# Patient Record
Sex: Female | Born: 1965
Health system: Southern US, Community
[De-identification: ages and names within clinical notes are randomized; demographics above are authoritative.]

## PROBLEM LIST (undated history)

## (undated) DIAGNOSIS — Z87442 Personal history of urinary calculi: Secondary | ICD-10-CM

## (undated) DIAGNOSIS — E785 Hyperlipidemia, unspecified: Secondary | ICD-10-CM

## (undated) DIAGNOSIS — F329 Major depressive disorder, single episode, unspecified: Secondary | ICD-10-CM

## (undated) DIAGNOSIS — J189 Pneumonia, unspecified organism: Secondary | ICD-10-CM

## (undated) DIAGNOSIS — F32A Depression, unspecified: Secondary | ICD-10-CM

## (undated) DIAGNOSIS — K219 Gastro-esophageal reflux disease without esophagitis: Secondary | ICD-10-CM

## (undated) DIAGNOSIS — Z136 Encounter for screening for cardiovascular disorders: Secondary | ICD-10-CM

## (undated) DIAGNOSIS — F419 Anxiety disorder, unspecified: Secondary | ICD-10-CM

## (undated) DIAGNOSIS — N200 Calculus of kidney: Secondary | ICD-10-CM

## (undated) HISTORY — DX: Major depressive disorder, single episode, unspecified: F32.9

## (undated) HISTORY — DX: Hyperlipidemia, unspecified: E78.5

## (undated) HISTORY — DX: Gastro-esophageal reflux disease without esophagitis: K21.9

## (undated) HISTORY — DX: Calculus of kidney: N20.0

## (undated) HISTORY — DX: Depression, unspecified: F32.A

## (undated) HISTORY — DX: Encounter for screening for cardiovascular disorders: Z13.6

## (undated) HISTORY — PX: CHOLECYSTECTOMY: SHX55

## (undated) HISTORY — PX: COLONOSCOPY: SHX174

## (undated) HISTORY — PX: ABDOMINAL HYSTERECTOMY: SHX81

---

## 1999-08-11 HISTORY — PX: PARTIAL HYSTERECTOMY: SHX80

## 1999-08-11 HISTORY — PX: ABDOMINAL HYSTERECTOMY: SHX81

## 2000-08-10 HISTORY — PX: CHOLECYSTECTOMY: SHX55

## 2006-09-27 ENCOUNTER — Other Ambulatory Visit: Payer: Self-pay

## 2006-09-27 ENCOUNTER — Emergency Department: Payer: Self-pay

## 2007-02-03 ENCOUNTER — Encounter: Payer: Self-pay | Admitting: Family Medicine

## 2007-04-28 ENCOUNTER — Ambulatory Visit: Payer: Self-pay | Admitting: Unknown Physician Specialty

## 2007-07-02 ENCOUNTER — Emergency Department: Payer: Self-pay | Admitting: Unknown Physician Specialty

## 2007-07-02 ENCOUNTER — Other Ambulatory Visit: Payer: Self-pay

## 2007-07-06 ENCOUNTER — Emergency Department: Payer: Self-pay | Admitting: Emergency Medicine

## 2007-07-07 ENCOUNTER — Encounter: Payer: Self-pay | Admitting: Family Medicine

## 2007-07-07 ENCOUNTER — Other Ambulatory Visit: Payer: Self-pay

## 2007-09-02 ENCOUNTER — Emergency Department: Payer: Self-pay | Admitting: Emergency Medicine

## 2007-09-03 ENCOUNTER — Other Ambulatory Visit: Payer: Self-pay

## 2008-03-28 ENCOUNTER — Ambulatory Visit: Payer: Self-pay | Admitting: Family Medicine

## 2008-03-28 DIAGNOSIS — E782 Mixed hyperlipidemia: Secondary | ICD-10-CM

## 2008-03-28 DIAGNOSIS — K219 Gastro-esophageal reflux disease without esophagitis: Secondary | ICD-10-CM | POA: Insufficient documentation

## 2008-03-28 DIAGNOSIS — R0789 Other chest pain: Secondary | ICD-10-CM | POA: Insufficient documentation

## 2008-03-28 DIAGNOSIS — E060 Acute thyroiditis: Secondary | ICD-10-CM | POA: Insufficient documentation

## 2008-03-28 DIAGNOSIS — M940 Chondrocostal junction syndrome [Tietze]: Secondary | ICD-10-CM

## 2008-03-28 DIAGNOSIS — G43009 Migraine without aura, not intractable, without status migrainosus: Secondary | ICD-10-CM | POA: Insufficient documentation

## 2008-03-29 ENCOUNTER — Telehealth: Payer: Self-pay | Admitting: Family Medicine

## 2008-04-27 LAB — CONVERTED CEMR LAB: Pap Smear: NORMAL

## 2008-05-07 ENCOUNTER — Ambulatory Visit: Payer: Self-pay | Admitting: Family Medicine

## 2008-05-07 DIAGNOSIS — Z87891 Personal history of nicotine dependence: Secondary | ICD-10-CM

## 2008-05-29 ENCOUNTER — Ambulatory Visit: Payer: Self-pay | Admitting: Family Medicine

## 2008-05-30 ENCOUNTER — Telehealth (INDEPENDENT_AMBULATORY_CARE_PROVIDER_SITE_OTHER): Payer: Self-pay | Admitting: *Deleted

## 2008-06-05 ENCOUNTER — Emergency Department: Payer: Self-pay | Admitting: Emergency Medicine

## 2008-10-25 ENCOUNTER — Emergency Department: Payer: Self-pay | Admitting: Emergency Medicine

## 2008-10-31 ENCOUNTER — Ambulatory Visit: Payer: Self-pay | Admitting: Family Medicine

## 2008-11-02 ENCOUNTER — Ambulatory Visit: Payer: Self-pay | Admitting: Family Medicine

## 2008-11-02 ENCOUNTER — Encounter: Payer: Self-pay | Admitting: Family Medicine

## 2008-12-05 ENCOUNTER — Ambulatory Visit: Payer: Self-pay | Admitting: Gastroenterology

## 2008-12-10 ENCOUNTER — Encounter: Payer: Self-pay | Admitting: Family Medicine

## 2008-12-10 ENCOUNTER — Emergency Department: Payer: Self-pay | Admitting: Emergency Medicine

## 2009-01-07 ENCOUNTER — Encounter: Payer: Self-pay | Admitting: Family Medicine

## 2009-01-09 ENCOUNTER — Ambulatory Visit: Payer: Self-pay | Admitting: Unknown Physician Specialty

## 2009-01-09 ENCOUNTER — Encounter: Payer: Self-pay | Admitting: Family Medicine

## 2009-01-11 ENCOUNTER — Ambulatory Visit: Payer: Self-pay | Admitting: Family Medicine

## 2009-04-22 ENCOUNTER — Emergency Department: Payer: Self-pay | Admitting: Emergency Medicine

## 2009-04-23 ENCOUNTER — Emergency Department (HOSPITAL_COMMUNITY): Admission: EM | Admit: 2009-04-23 | Discharge: 2009-04-23 | Payer: Self-pay | Admitting: Emergency Medicine

## 2009-05-01 ENCOUNTER — Ambulatory Visit: Payer: Self-pay | Admitting: Family Medicine

## 2009-05-01 DIAGNOSIS — R5383 Other fatigue: Secondary | ICD-10-CM | POA: Insufficient documentation

## 2009-05-06 LAB — CONVERTED CEMR LAB
AST: 33 units/L (ref 0–37)
Alkaline Phosphatase: 45 units/L (ref 39–117)
Basophils Absolute: 0.1 10*3/uL (ref 0.0–0.1)
Calcium: 9.2 mg/dL (ref 8.4–10.5)
GFR calc non Af Amer: 97.77 mL/min (ref >60)
Glucose, Bld: 80 mg/dL (ref 70–99)
Hemoglobin: 13.1 g/dL (ref 12.0–15.0)
Lymphocytes Relative: 33.9 % (ref 12.0–46.0)
Monocytes Relative: 5 % (ref 3.0–12.0)
Neutro Abs: 3.8 10*3/uL (ref 1.4–7.7)
Platelets: 212 10*3/uL (ref 150.0–400.0)
RDW: 12 % (ref 11.5–14.6)
Sodium: 139 meq/L (ref 135–145)
Total Bilirubin: 0.5 mg/dL (ref 0.3–1.2)
Vit D, 25-Hydroxy: 40 ng/mL (ref 30–89)
Vitamin B-12: 277 pg/mL (ref 211–911)

## 2009-05-16 ENCOUNTER — Ambulatory Visit: Payer: Self-pay | Admitting: Family Medicine

## 2009-05-16 DIAGNOSIS — F411 Generalized anxiety disorder: Secondary | ICD-10-CM | POA: Insufficient documentation

## 2009-08-13 ENCOUNTER — Encounter: Payer: Self-pay | Admitting: Family Medicine

## 2009-10-29 ENCOUNTER — Emergency Department: Payer: Self-pay | Admitting: Emergency Medicine

## 2009-10-30 ENCOUNTER — Encounter: Payer: Self-pay | Admitting: Family Medicine

## 2009-11-01 ENCOUNTER — Ambulatory Visit: Payer: Self-pay | Admitting: Family Medicine

## 2009-11-01 LAB — CONVERTED CEMR LAB
Bilirubin Urine: NEGATIVE
Blood in Urine, dipstick: NEGATIVE
Ketones, urine, test strip: NEGATIVE
Protein, U semiquant: NEGATIVE
Urobilinogen, UA: 0.2

## 2009-11-05 ENCOUNTER — Encounter: Payer: Self-pay | Admitting: Family Medicine

## 2010-01-21 ENCOUNTER — Encounter: Payer: Self-pay | Admitting: Family Medicine

## 2010-05-10 ENCOUNTER — Encounter: Payer: Self-pay | Admitting: Family Medicine

## 2010-05-10 LAB — CONVERTED CEMR LAB: Pap Smear: NORMAL

## 2010-05-10 LAB — HM MAMMOGRAPHY

## 2010-05-28 ENCOUNTER — Telehealth: Payer: Self-pay | Admitting: Family Medicine

## 2010-05-30 ENCOUNTER — Encounter: Payer: Self-pay | Admitting: Family Medicine

## 2010-05-30 ENCOUNTER — Ambulatory Visit: Payer: Self-pay | Admitting: Family Medicine

## 2010-05-30 DIAGNOSIS — M549 Dorsalgia, unspecified: Secondary | ICD-10-CM

## 2010-06-02 ENCOUNTER — Telehealth: Payer: Self-pay | Admitting: Family Medicine

## 2010-06-05 ENCOUNTER — Encounter: Admission: RE | Admit: 2010-06-05 | Discharge: 2010-06-05 | Payer: Self-pay | Admitting: Family Medicine

## 2010-06-11 ENCOUNTER — Ambulatory Visit: Payer: Self-pay | Admitting: Unknown Physician Specialty

## 2010-06-20 ENCOUNTER — Encounter: Payer: Self-pay | Admitting: Family Medicine

## 2010-07-11 ENCOUNTER — Emergency Department: Payer: Self-pay | Admitting: Emergency Medicine

## 2010-07-15 ENCOUNTER — Encounter: Payer: Self-pay | Admitting: Family Medicine

## 2010-08-20 ENCOUNTER — Emergency Department: Payer: Self-pay | Admitting: Emergency Medicine

## 2010-09-09 NOTE — Letter (Signed)
Summary: [facility]  [facility]   Imported By: Lanelle Bal 01/31/2010 10:15:14  _____________________________________________________________________  External Attachment:    Type:   Image     Comment:   External Document

## 2010-09-09 NOTE — Consult Note (Signed)
Summary: [facility]  [facility]   Imported By: Lanelle Bal 07/02/2010 10:15:36  _____________________________________________________________________  External Attachment:    Type:   Image     Comment:   External Document

## 2010-09-09 NOTE — Progress Notes (Signed)
  Phone Note Call from Patient   Caller: Patient Summary of Call: Called the patient and set up her MRI for thurs at [facility]. She is claustrophobic and is requesting that Valium be called in for her. [facility]. She will get a driver.  Initial call taken by: Carlton Adam,  June 02, 2010 9:50 AM  Follow-up for Phone Call        Valium too long acting for procedure will call in ativan. Follow-up by: Kerby Nora MD,  June 03, 2010 8:23 AM  Additional Follow-up for Phone Call Additional follow up Details #1::        rx called to pharmacy.Consuello Masse CMA   Additional Follow-up by: Benny Lennert CMA Duncan Dull),  June 03, 2010 8:50 AM    New/Updated Medications: ATIVAN 0.5 MG TABS (LORAZEPAM) 1-2 tabs prior to procedure. Prescriptions: ATIVAN 0.5 MG TABS (LORAZEPAM) 1-2 tabs prior to procedure.  #2 x 0   Entered and Authorized by:   Kerby Nora MD   Signed by:   Kerby Nora MD on 06/03/2010   Method used:   Telephoned to ...       [id number] (retail)       [address]       [place], [place]  [address]       Ph: [phone] or [phone]       Fax: [phone]   RxID:   [id number]
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
## 2010-09-09 NOTE — Consult Note (Signed)
Summary: [facility]  [facility]   Imported By: Lanelle Bal 08/26/2009 10:44:44  _____________________________________________________________________  External Attachment:    Type:   Image     Comment:   External Document

## 2010-09-09 NOTE — Progress Notes (Signed)
Summary: back pain   Phone Note Call from Patient Call back at Home Phone [phone]   Caller: Patient Call For: Kerby Nora MD Summary of Call: Patient says that she is in constant pain with her back and that Dr. Corliss Skains with [facility] is not doing anything to help her. She is asking if you could refer her to someone else. Pleae advise.  Initial call taken by: Melody Comas,  May 28, 2010 2:32 PM  Follow-up for Phone Call        Please have her make an appt to gather info and examine her for her back so I can determine best course of treatment..who to refer to etc.  Follow-up by: Kerby Nora MD,  May 29, 2010 8:20 AM  Additional Follow-up for Phone Call Additional follow up Details #1::        Patient says that she will make appt Additional Follow-up by: Benny Lennert CMA Duncan Dull),  May 29, 2010 9:23 AM

## 2010-09-09 NOTE — Assessment & Plan Note (Signed)
Summary: F/U [facility] ER ON 10/29/09/CLE   Vital Signs:  Patient profile:   45 year old female Height:      65.75 inches Weight:      153.2 pounds BMI:     25.01 Temp:     98.0 degrees F oral Pulse rate:   80 / minute Pulse rhythm:   regular BP sitting:   90 / 64  (left arm) Cuff size:   regular  Vitals Entered By: Benny Lennert CMA Duncan Dull) (November 01, 2009 9:44 AM)  History of Present Illness: Chief complaint follow up er chest pain ? myalagia  Has history of chest pain...went to ER bacause chest pain took her breath away.  Has had full work up. EKG, labs and CXR negative.  Chronic atypical Chest pain..under left breast radiates to left back...neg cardiac work up, neg GI work up..? esophageal spsam... hx of chostochondritis. Neg mammo.  Has negative CXR, neg Chest Ct ...multiple ER visits in last year.  Pain continues to be intermittant, tender to touch under right breast. Uses ibuprofen 200 mg intermittantly (stomach irritated with more)  Fatigue, chronic x years...still with diffuse body pain, calf pain....minimal improvement, worse recently. Sleeping poorly at night... continues with some anxiety.Marland Kitchendenies depression.  Painic attacks less frequent..uses lorezepam every 1-2 weeks.   We sent to Rheum in 08/2009.Marland Kitchen Blood tests RA neg..joints negative. Never returned as instructed..willing to make follow up appt.   Going to chiropractor.   Low abdominal cramping x 1 day. No dysuria, no frequency.  No vaginal itching or discharge. S/p hysterectomy.   Problems Prior to Update: 1)  Abdominal Cramps  (ICD-789.00) 2)  Myalgia  (ICD-729.1) 3)  Malaise and Fatigue  (ICD-780.79) 4)  Pers Hx Tobacco Use Presenting Hazards Health  (ICD-V15.82) 5)  Chest Pain, Atypical  (ICD-786.59) 6)  Costochondritis, Recurrent  (ICD-733.6) 7)  COPD  (ICD-496) 8)  Thyroiditis, Acute  (ICD-245.0) 9)  Hyperlipidemia  (ICD-272.4) 10)  Gerd  (ICD-530.81) 11)  Common Migraine  (ICD-346.10) 12)  Anxiety  Depression  (ICD-300.4)  Current Medications (verified): 1)  Vivelle-Dot 0.1 Mg/24hr Pttw (Estradiol) .... Twice Weekly 2)  Protonix 40 Mg  Tbec (Pantoprazole Sodium) .... Take 1 Tablet By Mouth Once A Day 3)  Red Yeast Rice 600 Mg  Caps (Red Yeast Rice Extract) .Marland Kitchen.. 1 Tab By Mouth Two Times A Day 4)  Omega-3-6-9  Caps (Omega 3-6-9 Fatty Acids) .Marland Kitchen.. 1 Capsule Twice A Day By Mouth 5)  Cymbalta 30 Mg Cpep (Duloxetine Hcl) .... Take 1 Tablet By Mouth Once A Day  Allergies: 1)  ! Penicillin 2)  ! Reglan  Past History:  Past medical, surgical, family and social histories (including risk factors) reviewed, and no changes noted (except as noted below).  Past Medical History: Reviewed history from 03/28/2008 and no changes required. Depression GERD Hyperlipidemia COPD  Past Surgical History: Reviewed history from 03/28/2008 and no changes required. partial  hysterectomy, abdominal  08/1999 05/2001 gallbladder, left ovary removed 2008 stress test negative  Family History: Reviewed history from 03/28/2008 and no changes required. father: MI age 80, HTN mother: COPD PGM: COPD MGF: renal failure, dialysis brother:leukemia  Social History: Reviewed history from 03/28/2008 and no changes required. 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 CV:  Complains of chest pain or discomfort. Resp:  Denies  shortness of breath. GI:  Complains of abdominal pain; denies bloody stools, constipation, diarrhea, gas, indigestion, nausea, vomiting, and vomiting blood. GU:  Denies dysuria.  Physical Exam  General:  Well-developed,well-nourished,in no acute distress; alert,appropriate and cooperative throughout examination Mouth:  MMM Neck:  no carotid bruit or thyromegaly no  cervical or supraclavicular lymphadenopathy  Chest Wall:  ttp under left breast on chest wall Lungs:  Normal respiratory effort, chest expands symmetrically. Lungs are clear to auscultation, no crackles or wheezes. Heart:  Normal rate and regular rhythm. S1 and S2 normal without gallop, murmur, click, rub or other extra sounds. Abdomen:  Bowel sounds positive,abdomen soft and non-tender without masses, organomegaly or hernias noted. Pulses:  R and L posterior tibial pulses are full and equal bilaterally  Extremities:  no edema Skin:  Intact without suspicious lesions or rashes Psych:  Oriented X3, memory intact for recent and remote, and moderately anxious.     Impression & Recommendations:  Problem # 1:  CHEST PAIN, ATYPICAL (ICD-786.59) Neg cardiac work up, neg GI work up..? esophageal spsam... hx of chostochondritis. Neg mammo.  Has negative CXR, neg Chest Ct ...multiple ER visits in last year.  Reviewed recetn ER notes.  Encouraged her to not go to ER for chest pain unless different than usual.. Cll  our office instead  Problem # 2:  MYALGIA (ICD-729.1) Encouraged her to return top Dr. Corliss Skains for any other medicaiton/treatment suggestions.   Problem # 3:  MALAISE AND FATIGUE (ICD-780.79) LAb eval negative. Encouraged healthy eating , regular exercise.  On cymbalta low dose..has not tolerated higher.   Problem # 4:  ABDOMINAL CRAMPS (ICD-789.00) No sign of UTI. ? related to IBS and poor diet.  If continues she will call for further eval.  Orders: UA Dipstick w/o Micro (manual) (28413)  Complete Medication List: 1)  Vivelle-dot 0.1 Mg/24hr Pttw (Estradiol) .... Twice weekly 2)  Protonix 40 Mg Tbec (Pantoprazole sodium) .... Take 1 tablet by mouth once a day 3)  Red Yeast Rice 600 Mg Caps (Red yeast rice extract) .Marland Kitchen.. 1 tab by mouth two times a day 4)  Omega-3-6-9 Caps (Omega 3-6-9 fatty acids) .Marland Kitchen.. 1 capsule twice a day by mouth 5)  Cymbalta 30 Mg Cpep (Duloxetine hcl) ....  Take 1 tablet by mouth once a day  Patient Instructions: 1)  Recommend returning to rheumatologist in next few weeks, overdue for follow up.  Current Allergies (reviewed today): ! PENICILLIN ! [facility] Laboratory Results   Urine Tests  Date/Time Received: November 01, 2009 10:39 AM  Date/Time Reported: November 01, 2009 10:39 AM   Routine Urinalysis   Color: yellow Appearance: Clear Glucose: negative   (Normal Range: Negative) Bilirubin: negative   (Normal Range: Negative) Ketone: negative   (Normal Range: Negative) Spec. Gravity: >=1.030   (Normal Range: 1.003-1.035) Blood: negative   (Normal Range: Negative) pH: 5.0   (Normal Range: 5.0-8.0) Protein: negative   (Normal Range: Negative) Urobilinogen: 0.2   (Normal Range: 0-1) Nitrite: negative   (Normal Range: Negative) Leukocyte Esterace: negative   (Normal Range: Negative)

## 2010-09-09 NOTE — Letter (Signed)
Summary: [facility]  [facility]   Imported By: Lanelle Bal 11/12/2009 13:36:35  _____________________________________________________________________  External Attachment:    Type:   Image     Comment:   External Document

## 2010-09-11 NOTE — Op Note (Signed)
Summary: Lumbar Epidural Steroid Injection  Lumbar Epidural Steroid Injection   Imported By: Maryln Gottron 07/21/2010 11:22:12  _____________________________________________________________________  External Attachment:    Type:   Image     Comment:   External Document

## 2010-09-16 ENCOUNTER — Emergency Department: Payer: Self-pay | Admitting: Emergency Medicine

## 2010-10-03 ENCOUNTER — Ambulatory Visit (INDEPENDENT_AMBULATORY_CARE_PROVIDER_SITE_OTHER): Payer: 59 | Admitting: Family Medicine

## 2010-10-03 ENCOUNTER — Encounter: Payer: Self-pay | Admitting: Family Medicine

## 2010-10-03 ENCOUNTER — Other Ambulatory Visit: Payer: Self-pay | Admitting: Family Medicine

## 2010-10-03 DIAGNOSIS — R5383 Other fatigue: Secondary | ICD-10-CM

## 2010-10-03 DIAGNOSIS — R5381 Other malaise: Secondary | ICD-10-CM

## 2010-10-03 DIAGNOSIS — E785 Hyperlipidemia, unspecified: Secondary | ICD-10-CM

## 2010-10-03 LAB — CBC WITH DIFFERENTIAL/PLATELET
Basophils Relative: 0.4 % (ref 0.0–3.0)
Eosinophils Absolute: 0.1 10*3/uL (ref 0.0–0.7)
Lymphocytes Relative: 34.6 % (ref 12.0–46.0)
MCHC: 34.3 g/dL (ref 30.0–36.0)
Neutrophils Relative %: 56.3 % (ref 43.0–77.0)
Platelets: 251 10*3/uL (ref 150.0–400.0)
RBC: 4.3 Mil/uL (ref 3.87–5.11)
WBC: 6.1 10*3/uL (ref 4.5–10.5)

## 2010-10-03 LAB — LIPID PANEL
Cholesterol: 227 mg/dL — ABNORMAL HIGH (ref 0–200)
Total CHOL/HDL Ratio: 3
Triglycerides: 164 mg/dL — ABNORMAL HIGH (ref 0.0–149.0)
VLDL: 32.8 mg/dL (ref 0.0–40.0)

## 2010-10-03 LAB — HEPATIC FUNCTION PANEL
Alkaline Phosphatase: 43 U/L (ref 39–117)
Bilirubin, Direct: 0.1 mg/dL (ref 0.0–0.3)
Total Bilirubin: 0.5 mg/dL (ref 0.3–1.2)
Total Protein: 6.8 g/dL (ref 6.0–8.3)

## 2010-10-03 LAB — B12 AND FOLATE PANEL: Folate: 18.7 ng/mL (ref >5.9)

## 2010-10-03 LAB — BASIC METABOLIC PANEL
Calcium: 9.4 mg/dL (ref 8.4–10.5)
Creatinine, Ser: 0.8 mg/dL (ref 0.4–1.2)

## 2010-10-03 LAB — LDL CHOLESTEROL, DIRECT: Direct LDL: 134.7 mg/dL

## 2010-10-06 LAB — CONVERTED CEMR LAB: Vit D, 25-Hydroxy: 34 ng/mL (ref 30–89)

## 2010-10-07 NOTE — Assessment & Plan Note (Signed)
Summary: CHECK FOR DIABETES/CLE   [employer]   Vital Signs:  Patient profile:   45 year old female Height:      65.75 inches Weight:      153.50 pounds BMI:     25.05 Temp:     98.0 degrees F oral Pulse rate:   70 / minute Pulse rhythm:   regular BP sitting:   92 / 60  (left arm) Cuff size:   regular  Vitals Entered By: Linde Gillis CMA Duncan Dull) (October 03, 2010 7:57 AM) CC: check for diabetes   History of Present Illness: In last few months... she has been working on [employer]. She has noticed she has been feeling" groggy' After eating carbohydrates... ahe felt bad.  If skipping meals she feels shaky, poor feeling. Fatigue, increased thirst. Some increase in urinary frequency.  Chronic back pain... significantly improved with steroid injections.  No other new meds or OTC supplements.  Problems Prior to Update: 1)  Low Back Pain, Chronic  (ICD-724.2) 2)  Abdominal Cramps  (ICD-789.00) 3)  Myalgia  (ICD-729.1) 4)  Malaise and Fatigue  (ICD-780.79) 5)  Pers Hx Tobacco Use Presenting Hazards Health  (ICD-V15.82) 6)  Chest Pain, Atypical  (ICD-786.59) 7)  Costochondritis, Recurrent  (ICD-733.6) 8)  Thyroiditis, Acute  (ICD-245.0) 9)  Hyperlipidemia  (ICD-272.4) 10)  Gerd  (ICD-530.81) 11)  Common Migraine  (ICD-346.10) 12)  Anxiety Depression  (ICD-300.4)  Current Medications (verified): 1)  Omega-3-6-9  Caps (Omega 3-6-9 Fatty Acids) .Marland Kitchen.. 1 Capsule Twice A Day By Mouth 2)  Tramadol Hcl 50 Mg Tabs (Tramadol Hcl) .... Take 1 Tablet By Mouth Two Times A Day 3)  Estradiol 2 Mg Tabs (Estradiol) .... Take 1 Tablet By Mouth Once A Day  Allergies: 1)  ! Penicillin 2)  ! Reglan  Past History:  Past medical, surgical, family and social histories (including risk factors) reviewed, and no changes noted (except as noted below).  Past Medical History: Reviewed history from 03/28/2008 and no changes required. Depression GERD Hyperlipidemia COPD  Past Surgical  History: Reviewed history from 03/28/2008 and no changes required. partial  hysterectomy, abdominal  08/1999 05/2001 gallbladder, left ovary removed 2008 stress test negative  Family History: Reviewed history from 03/28/2008 and no changes required. father: MI age 6, HTN mother: COPD PGM: COPD MGF: renal failure, dialysis brother:leukemia  Social History: Reviewed history from 03/28/2008 and no changes required. 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 CV:  no current chest pain.. has had ER visit few weeks ago... Had elevated ddimer and normal Chest CT. Marland Kitchen Resp:  Denies shortness of breath. GI:  Denies abdominal pain and bloody stools. GU:  Denies abnormal vaginal bleeding and dysuria.  Physical Exam  General:  Well-developed,well-nourished,in no acute distress; alert,appropriate and cooperative throughout examination Eyes:  No corneal or conjunctival inflammation noted. EOMI. Perrla. Funduscopic exam benign, without hemorrhages, exudates or papilledema. Vision grossly normal. Ears:  External ear exam shows no significant lesions or deformities.  Otoscopic examination reveals clear canals, tympanic membranes are intact bilaterally without bulging, retraction, inflammation or discharge. Hearing is grossly normal bilaterally. Nose:  External nasal examination shows no deformity or inflammation. Nasal mucosa are pink and moist without lesions or exudates. Mouth:  Oral mucosa and oropharynx without lesions or exudates.  Teeth in good repair. Neck:  no carotid bruit or thyromegaly no cervical or supraclavicular lymphadenopathy  Lungs:  Normal respiratory effort, chest expands symmetrically. Lungs are clear to auscultation, no crackles or wheezes. Heart:  Normal  rate and regular rhythm. S1 and S2 normal without gallop, murmur, click, rub or other extra sounds. Abdomen:  Bowel sounds positive,abdomen soft and non-tender without masses, organomegaly or hernias noted. Pulses:  R and L posterior tibial pulses are full and equal bilaterally  Extremities:  no edema Skin:  Intact without suspicious lesions or rashes Psych:  Cognition and judgment appear intact. Alert and cooperative with normal attention span and concentration. No apparent delusions, illusions, hallucinations   Impression & Recommendations:  Problem # 1:  MALAISE AND FATIGUE (ICD-780.79)  Eval with labs. Encouraged eating 3 meals a day, increasing fluids.    Orders: TLB-BMP (Basic Metabolic Panel-BMET) (80048-METABOL) TLB-CBC Platelet - w/Differential (85025-CBCD) TLB-Hepatic/Liver Function Pnl (80076-HEPATIC) TLB-TSH (Thyroid Stimulating Hormone) (84443-TSH) TLB-B12 + Folate Pnl (16109_60454-U98/JXB)  Problem # 2:  CHEST PAIN, ATYPICAL (ICD-786.59) Hx of recurrent costrocondritis and anxiety related cchest pain. Work up in past neg... has seen cards and GI.   Problem # 3:  HYPERLIPIDEMIA (ICD-272.4) Due for reeval.  Orders: TLB-Lipid Panel (80061-LIPID)  Complete Medication List: 1)  Omega-3-6-9 Caps (Omega 3-6-9 fatty acids) .Marland Kitchen.. 1 capsule twice a day by mouth 2)  Tramadol Hcl 50 Mg Tabs (Tramadol hcl) .... Take 1 tablet by mouth two times a day 3)  Estradiol 2 Mg Tabs (Estradiol) .... Take 1 tablet by mouth once a day  Other Orders: T-Vitamin D (25-Hydroxy) ([id number])   Orders Added: 1)  TLB-Lipid Panel [80061-LIPID] 2)  TLB-BMP (Basic Metabolic Panel-BMET) [80048-METABOL] 3)  TLB-CBC Platelet - w/Differential [85025-CBCD] 4)  TLB-Hepatic/Liver Function Pnl [80076-HEPATIC] 5)  TLB-TSH (Thyroid Stimulating Hormone) [84443-TSH] 6)  TLB-B12 + Folate Pnl [82746_82607-B12/FOL] 7)  T-Vitamin D (25-Hydroxy) [30865-78469] 8)  Est. Patient Level III  [62952]    Current Allergies (reviewed today): ! PENICILLIN ! REGLAN  Last PAP:  Normal (04/27/2008 2:27:48 PM) PAP Result Date:  05/10/2010 PAP Result:  normal PAP Next Due:  1 yr Last Mammogram:  Normal Bilateral (04/11/2007 2:27:48 PM) Mammogram Result Date:  05/10/2010 Mammogram Result:  normal Mammogram Next Due:  1 yr

## 2010-11-14 LAB — DIFFERENTIAL
Basophils Absolute: 0 10*3/uL (ref 0.0–0.1)
Basophils Relative: 1 % (ref 0–1)
Eosinophils Relative: 2 % (ref 0–5)
Monocytes Absolute: 0.4 10*3/uL (ref 0.1–1.0)
Monocytes Relative: 6 % (ref 3–12)

## 2010-11-14 LAB — URINALYSIS, ROUTINE W REFLEX MICROSCOPIC
Glucose, UA: NEGATIVE mg/dL
Hgb urine dipstick: NEGATIVE
Leukocytes, UA: NEGATIVE
Protein, ur: NEGATIVE mg/dL
Specific Gravity, Urine: 1.018 (ref 1.005–1.030)
Urobilinogen, UA: 0.2 mg/dL (ref 0.0–1.0)

## 2010-11-14 LAB — CBC
HCT: 38.4 % (ref 36.0–46.0)
Platelets: 218 10*3/uL (ref 150–400)
RDW: 12.6 % (ref 11.5–15.5)
WBC: 6 10*3/uL (ref 4.0–10.5)

## 2010-11-14 LAB — COMPREHENSIVE METABOLIC PANEL
ALT: 18 U/L (ref 0–35)
AST: 21 U/L (ref 0–37)
Albumin: 3.9 g/dL (ref 3.5–5.2)
Alkaline Phosphatase: 38 U/L — ABNORMAL LOW (ref 39–117)
Chloride: 104 mEq/L (ref 96–112)
GFR calc Af Amer: >60 mL/min (ref >60)
Potassium: 3.7 mEq/L (ref 3.5–5.1)
Sodium: 140 mEq/L (ref 135–145)
Total Bilirubin: 0.6 mg/dL (ref 0.3–1.2)
Total Protein: 6.4 g/dL (ref 6.0–8.3)

## 2010-11-14 LAB — URINE MICROSCOPIC-ADD ON

## 2010-11-14 LAB — URINE CULTURE

## 2010-12-18 DIAGNOSIS — Z136 Encounter for screening for cardiovascular disorders: Secondary | ICD-10-CM

## 2010-12-18 HISTORY — DX: Encounter for screening for cardiovascular disorders: Z13.6

## 2010-12-19 ENCOUNTER — Encounter: Payer: Self-pay | Admitting: Family Medicine

## 2010-12-30 ENCOUNTER — Encounter: Payer: Self-pay | Admitting: Family Medicine

## 2011-03-04 ENCOUNTER — Encounter: Payer: Self-pay | Admitting: Family Medicine

## 2011-03-04 ENCOUNTER — Ambulatory Visit (INDEPENDENT_AMBULATORY_CARE_PROVIDER_SITE_OTHER): Payer: 59 | Admitting: Family Medicine

## 2011-03-04 VITALS — BP 90/60 | HR 82 | Temp 97.9°F | Ht 65.5 in | Wt 153.4 lb

## 2011-03-04 DIAGNOSIS — R300 Dysuria: Secondary | ICD-10-CM

## 2011-03-04 DIAGNOSIS — R109 Unspecified abdominal pain: Secondary | ICD-10-CM

## 2011-03-04 DIAGNOSIS — R1030 Lower abdominal pain, unspecified: Secondary | ICD-10-CM | POA: Insufficient documentation

## 2011-03-04 LAB — POCT URINALYSIS DIPSTICK
Glucose, UA: NEGATIVE
Ketones, UA: NEGATIVE
Spec Grav, UA: 1.03
Urobilinogen, UA: NEGATIVE

## 2011-03-04 MED ORDER — CIPROFLOXACIN HCL 250 MG PO TABS: 250.0000 mg | ORAL_TABLET | Freq: Two times a day (BID) | ORAL | Status: AC

## 2011-03-04 NOTE — Assessment & Plan Note (Addendum)
Not typical but appears most consistent with UTI. Will send for culture.  Push fluids treat with antibiotics. Has had spome mild diarrhea in past few days and nausea... GI symptoms  May be viral GE.  No blood and bilateral pain suggesting AGAINST stones.  S/P total hysterectomy.

## 2011-03-04 NOTE — Progress Notes (Signed)
  Subjective:    Patient ID: Rebecca Allen, female    DOB: 07/13/1966, 45 y.o.   MRN: [id number]  Urinary Tract Infection  This is a new problem. The current episode started in the past 7 days (3 days ago ). The problem occurs every urination. The quality of the pain is described as stabbing (No burning withurination but sharp apins in low abdomen and low back, severe, started when she was urinating, but now continuous.). The pain is severe. There has been no fever. She is sexually active. There is no history of pyelonephritis. Associated symptoms include chills, frequency and nausea. Pertinent negatives include no discharge, flank pain, hematuria, urgency or vomiting. Treatments tried: water, azo and cranberry. The treatment provided mild relief. There is no history of catheterization, kidney stones, recurrent UTIs, a single kidney, urinary stasis or a urological procedure.   No menses as had complete hysterectomy for menorhhagia, fibroids, cyst  On ovaries.  Review of Systems  Constitutional: Positive for chills. Negative for fever and fatigue.  HENT: Negative for ear pain.   Eyes: Negative for pain.  Respiratory: Negative for chest tightness and shortness of breath.   Cardiovascular: Negative for chest pain, palpitations and leg swelling.  Gastrointestinal: Positive for nausea and diarrhea. Negative for vomiting and abdominal pain.  Genitourinary: Positive for frequency. Negative for dysuria, urgency, hematuria and flank pain.       Objective:   Physical Exam  Constitutional: Vital signs are normal. She appears well-developed and well-nourished. She is cooperative.  Non-toxic appearance. She does not appear ill. No distress.  HENT:  Head: Normocephalic.  Right Ear: Hearing, tympanic membrane, external ear and ear canal normal. Tympanic membrane is not erythematous, not retracted and not bulging.  Left Ear: Hearing, tympanic membrane, external ear and ear canal normal. Tympanic  membrane is not erythematous, not retracted and not bulging.  Nose: No mucosal edema or rhinorrhea. Right sinus exhibits no maxillary sinus tenderness and no frontal sinus tenderness. Left sinus exhibits no maxillary sinus tenderness and no frontal sinus tenderness.  Mouth/Throat: Uvula is midline, oropharynx is clear and moist and mucous membranes are normal.  Eyes: Conjunctivae, EOM and lids are normal. Pupils are equal, round, and reactive to light. No foreign bodies found.  Neck: Trachea normal and normal range of motion. Neck supple. Carotid bruit is not present. No mass and no thyromegaly present.  Cardiovascular: Normal rate, regular rhythm, S1 normal, S2 normal, normal heart sounds, intact distal pulses and normal pulses.  Exam reveals no gallop and no friction rub.   No murmur heard. Pulmonary/Chest: Effort normal and breath sounds normal. Not tachypneic. No respiratory distress. She has no decreased breath sounds. She has no wheezes. She has no rhonchi. She has no rales.  Abdominal: Soft. Normal appearance and bowel sounds are normal. There is tenderness in the left upper quadrant and left lower quadrant. There is CVA tenderness.       Bilateral CVA tenderness  Neurological: She is alert.  Skin: Skin is warm, dry and intact. No rash noted.  Psychiatric: Her speech is normal and behavior is normal. Judgment and thought content normal. Her mood appears not anxious. Cognition and memory are normal. She does not exhibit a depressed mood.          Assessment & Plan:

## 2011-03-04 NOTE — Patient Instructions (Addendum)
We will call you with culture results.  Go ahead and start cipro  Twice daily for 3 days. Call if symptoms not improving.  Push fluids.

## 2011-03-06 LAB — URINE CULTURE
Colony Count: NO GROWTH
Organism ID, Bacteria: NO GROWTH

## 2012-08-22 ENCOUNTER — Encounter: Payer: Self-pay | Admitting: Family Medicine

## 2012-08-22 ENCOUNTER — Ambulatory Visit (INDEPENDENT_AMBULATORY_CARE_PROVIDER_SITE_OTHER): Payer: 59 | Admitting: Family Medicine

## 2012-08-22 VITALS — BP 120/74 | HR 82 | Temp 98.3°F | Ht 65.5 in | Wt 158.0 lb

## 2012-08-22 DIAGNOSIS — K5289 Other specified noninfective gastroenteritis and colitis: Secondary | ICD-10-CM

## 2012-08-22 DIAGNOSIS — K529 Noninfective gastroenteritis and colitis, unspecified: Secondary | ICD-10-CM

## 2012-08-22 MED ORDER — ONDANSETRON HCL 8 MG PO TABS: 8.0000 mg | ORAL_TABLET | Freq: Three times a day (TID) | ORAL | Status: DC | PRN

## 2012-08-22 NOTE — Progress Notes (Signed)
Nature conservation officer at [facility] [address] [place] [address] Phone: [phone] Fax: [phone]  Date:  08/22/2012   Name:  Rebecca Allen   DOB:  November 13, 1965   MRN:  [id number] Gender: female Age: 47 y.o.  PCP:  Kerby Nora, MD  Evaluating MD: Hannah Beat, MD   Chief Complaint: flu like symptoms   History of Present Illness:  Rebecca Allen is a 47 y.o. pleasant patient who presents with the following:  Nauseated and started yesterday. Has one at home sick right now. Works in a nursing home. Norovirus is going around. Yesterday could not get anything down, the chills and could not keep much. A little stuffy, but not much cough. Nausea worst. A little diarrhea.  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   Patient Active Problem List  Diagnosis  . THYROIDITIS, ACUTE  . HYPERLIPIDEMIA  . ANXIETY DEPRESSION  . COMMON MIGRAINE  . GERD  . LOW BACK PAIN, CHRONIC  . COSTOCHONDRITIS, RECURRENT  . MALAISE AND FATIGUE  . CHEST PAIN, ATYPICAL  . PERS HX TOBACCO USE PRESENTING HAZARDS HEALTH  . Lower abdominal pain    Past Medical History  Diagnosis Date  . Treadmill stress test negative for angina pectoris 12/18/2010  . Depression   . GERD (gastroesophageal reflux disease)   . Hyperlipidemia   . COPD (chronic obstructive pulmonary disease)     Past Surgical History  Procedure Date  . Partial hysterectomy 08-1999    abdominal    History  Substance Use Topics  . Smoking status: Current Every Day Smoker  . Smokeless tobacco: Not on file  . Alcohol Use: No     Comment: 2 beers on weekends     Family History  Problem Relation Age of Onset  . COPD Mother   . Heart attack Father   . Hypertension Father   . Leukemia Brother   . Kidney failure Maternal Grandfather   . COPD Paternal Grandmother     Allergies  Allergen Reactions  . Metoclopramide Hcl     REACTION: Nervousness  . Penicillins     REACTION:  Upsets stomach    Medication list has been reviewed and updated.  Outpatient Prescriptions Prior to Visit  Medication Sig Dispense Refill  . traMADol (ULTRAM) 50 MG tablet Take 50 mg by mouth 2 (two) times daily.        Marland Kitchen estradiol (VIVELLE-DOT) 0.025 MG/24HR Place 1 patch onto the skin 2 (two) times a week.         Last reviewed on 08/22/2012 10:34 AM by Consuello Masse, CMA  Review of Systems:  ROS: GEN: Acute illness details above GI: as above GU: maintaining adequate hydration and urination Pulm: No SOB Interactive and getting along well at home.  Otherwise, ROS is as per the HPI.   Physical Examination: BP 120/74  Pulse 82  Temp 98.3 F (36.8 C) (Oral)  Ht 5' 5.5" (1.664 m)  Wt 158 lb (71.668 kg)  BMI 25.89 kg/m2  SpO2 97%  Ideal Body Weight: Weight in (lb) to have BMI = 25: 152.2   GEN: WDWN, NAD, Non-toxic, A & O x 3 HEENT: Atraumatic, Normocephalic. Neck supple. No masses, No LAD. Ears and Nose: No external deformity. CV: RRR, No M/G/R. No JVD. No thrill. No extra heart sounds. PULM: CTA B, no wheezes, crackles, rhonchi. No retractions. No resp. distress. No accessory muscle use. ABD: S, NT, ND, hyperactive BS. No rebound. No  HSM. EXTR: No c/c/e NEURO Normal gait.  PSYCH: Normally interactive. Conversant. Not depressed or anxious appearing.  Calm demeanor.    Assessment and Plan:  1. Gastroenteritis    Supportive care reviewed  Orders Today:  No orders of the defined types were placed in this encounter.    Updated Medication List: (Includes new medications, updates to list, dose adjustments) Meds ordered this encounter  Medications  . ondansetron (ZOFRAN) 8 MG tablet    Sig: Take 1 tablet (8 mg total) by mouth every 8 (eight) hours as needed for nausea.    Dispense:  30 tablet    Refill:  0    Medications Discontinued: There are no discontinued medications.   Hannah Beat, MD

## 2012-09-21 ENCOUNTER — Ambulatory Visit: Payer: 59 | Admitting: Family Medicine

## 2012-09-21 ENCOUNTER — Encounter: Payer: Self-pay | Admitting: Family Medicine

## 2012-09-21 ENCOUNTER — Ambulatory Visit (INDEPENDENT_AMBULATORY_CARE_PROVIDER_SITE_OTHER): Payer: 59 | Admitting: Family Medicine

## 2012-09-21 VITALS — BP 120/72 | HR 70 | Temp 97.4°F | Ht 65.5 in | Wt 158.5 lb

## 2012-09-21 DIAGNOSIS — J209 Acute bronchitis, unspecified: Secondary | ICD-10-CM

## 2012-09-21 DIAGNOSIS — Z87891 Personal history of nicotine dependence: Secondary | ICD-10-CM

## 2012-09-21 MED ORDER — HYDROCODONE-HOMATROPINE 5-1.5 MG/5ML PO SYRP: ORAL_SOLUTION | ORAL | Status: DC

## 2012-09-21 MED ORDER — AZITHROMYCIN 250 MG PO TABS: ORAL_TABLET | ORAL | Status: DC

## 2012-09-21 NOTE — Progress Notes (Signed)
Nature conservation officer at [facility] [address] [place] [address] Phone: [phone] Fax: [phone]  Date:  09/21/2012   Name:  Rebecca Allen   DOB:  1965-11-01   MRN:  [id number] Gender: female Age: 47 y.o.  Primary Physician:  Kerby Nora, MD  Evaluating MD: Hannah Beat, MD   Chief Complaint: Nasal Congestion   History of Present Illness:  Rebecca Allen is a 47 y.o. pleasant patient who presents with the following:  Cold for a couple of days at home.   Acute Bronchitis: Patient presents for presents evaluation of dyspnea, bilateral ear congestion, productive cough with sputum described as yellow and green and rhinorrhea for a few days. Symptoms began 3 days ago and are gradually worsening since that time.  Past history is significant for occasional episodes of bronchitis and tobacco abuse.    Patient Active Problem List  Diagnosis  . THYROIDITIS, ACUTE  . HYPERLIPIDEMIA  . ANXIETY DEPRESSION  . COMMON MIGRAINE  . GERD  . LOW BACK PAIN, CHRONIC  . COSTOCHONDRITIS, RECURRENT  . MALAISE AND FATIGUE  . CHEST PAIN, ATYPICAL  . PERS HX TOBACCO USE PRESENTING HAZARDS HEALTH  . Lower abdominal pain    Past Medical History  Diagnosis Date  . Treadmill stress test negative for angina pectoris 12/18/2010  . Depression   . GERD (gastroesophageal reflux disease)   . Hyperlipidemia   . COPD (chronic obstructive pulmonary disease)     Past Surgical History  Procedure Laterality Date  . Partial hysterectomy  08-1999    abdominal    History  Substance Use Topics  . Smoking status: Current Every Day Smoker  . Smokeless tobacco: Not on file  . Alcohol Use: No     Comment: 2 beers on weekends     Family History  Problem Relation Age of Onset  . COPD Mother   . Heart attack Father   . Hypertension Father   . Leukemia Brother   . Kidney failure Maternal Grandfather   . COPD Paternal Grandmother     Allergies  Allergen Reactions  .  Metoclopramide Hcl     REACTION: Nervousness  . Penicillins     REACTION: Upsets stomach    Medication list has been reviewed and updated.  Outpatient Prescriptions Prior to Visit  Medication Sig Dispense Refill  . estradiol (VIVELLE-DOT) 0.025 MG/24HR Place 1 patch onto the skin 2 (two) times a week.        . traMADol (ULTRAM) 50 MG tablet Take 50 mg by mouth 2 (two) times daily.        . ondansetron (ZOFRAN) 8 MG tablet Take 1 tablet (8 mg total) by mouth every 8 (eight) hours as needed for nausea.  30 tablet  0   No facility-administered medications prior to visit.    Review of Systems:  ROS: GEN: Acute illness details above GI: Tolerating PO intake GU: maintaining adequate hydration and urination Pulm: No SOB Interactive and getting along well at home.  Otherwise, ROS is as per the HPI.   Physical Examination: BP 120/72  Pulse 70  Temp(Src) 97.4 F (36.3 C) (Oral)  Ht 5' 5.5" (1.664 m)  Wt 158 lb 8 oz (71.895 kg)  BMI 25.97 kg/m2  SpO2 97%  Ideal Body Weight: Weight in (lb) to have BMI = 25: 152.2   GEN: A and O x 3. WDWN. NAD.    ENT: Nose clear, ext NML.  No LAD.  No JVD.  TM's clear. Oropharynx  clear.  PULM: Normal WOB, no distress. No crackles, wheezes, rhonchi. CV: RRR, no M/G/R, No rubs, No JVD.   EXT: warm and well-perfused, No c/c/e. PSYCH: Pleasant and conversant.  Assessment and Plan:  1. Acute bronchitis   2. PERS HX TOBACCO USE PRESENTING HAZARDS HEALTH    Acute bronchitis: discussed plan of care. Given length of symptoms and overall history and tobacco, will treat with ABX in this case. Continue with additional supportive care, cough medications, liquids, sleep, steam / vaporizer.  Orders Today:  No orders of the defined types were placed in this encounter.    Updated Medication List: (Includes new medications, updates to list, dose adjustments) Meds ordered this encounter  Medications  . azithromycin (ZITHROMAX) 250 MG tablet    Sig: 2  tabs po on day 1, then 1 tab po for 4 days    Dispense:  6 tablet    Refill:  0  . HYDROcodone-homatropine (HYCODAN) 5-1.5 MG/5ML syrup    Sig: 1 tsp po at night before bed prn cough    Dispense:  240 mL    Refill:  0    Medications Discontinued: There are no discontinued medications.   Signed, Elpidio Galea. Tzion Wedel, MD 09/21/2012 9:21 AM

## 2012-10-14 ENCOUNTER — Emergency Department: Payer: Self-pay | Admitting: Emergency Medicine

## 2013-02-26 ENCOUNTER — Emergency Department: Payer: Self-pay | Admitting: Emergency Medicine

## 2013-04-24 ENCOUNTER — Encounter: Payer: Self-pay | Admitting: Orthopedic Surgery

## 2013-05-10 ENCOUNTER — Encounter: Payer: Self-pay | Admitting: Orthopedic Surgery

## 2013-07-14 ENCOUNTER — Encounter: Payer: Self-pay | Admitting: Family Medicine

## 2013-07-14 ENCOUNTER — Ambulatory Visit (INDEPENDENT_AMBULATORY_CARE_PROVIDER_SITE_OTHER): Payer: 59 | Admitting: Family Medicine

## 2013-07-14 VITALS — BP 92/66 | HR 76 | Temp 97.9°F | Wt 160.5 lb

## 2013-07-14 DIAGNOSIS — R5381 Other malaise: Secondary | ICD-10-CM

## 2013-07-14 LAB — COMPREHENSIVE METABOLIC PANEL
ALT: 30 U/L (ref 0–35)
Albumin: 4.6 g/dL (ref 3.5–5.2)
CO2: 26 mEq/L (ref 19–32)
Calcium: 9.1 mg/dL (ref 8.4–10.5)
Chloride: 103 mEq/L (ref 96–112)
Creatinine, Ser: 0.8 mg/dL (ref 0.4–1.2)
GFR: 87.19 mL/min (ref >60.00)

## 2013-07-14 LAB — CBC WITH DIFFERENTIAL/PLATELET
Basophils Absolute: 0 10*3/uL (ref 0.0–0.1)
Basophils Relative: 0.4 % (ref 0.0–3.0)
Hemoglobin: 14.3 g/dL (ref 12.0–15.0)
Lymphocytes Relative: 31.7 % (ref 12.0–46.0)
Monocytes Relative: 5.9 % (ref 3.0–12.0)
Neutro Abs: 4.3 10*3/uL (ref 1.4–7.7)
RBC: 4.69 Mil/uL (ref 3.87–5.11)
RDW: 12.9 % (ref 11.5–14.6)

## 2013-07-14 NOTE — Patient Instructions (Signed)
Go to the lab on the way out.  We'll contact you with your lab report. We'll go from there.   Take care.  

## 2013-07-14 NOTE — Assessment & Plan Note (Addendum)
No clear changes on exam other the nail changes, of uncertain significance.  Would check basic labs today and we'll go from there.  She is nontoxic on exam.  Okay for outpatient fu.  She agrees with plan.

## 2013-07-14 NOTE — Progress Notes (Signed)
Pre-visit discussion using our clinic review tool. No additional management support is needed unless otherwise documented below in the visit note.  Sx started a few weeks ago.  Fatigued.  "Drained."  Intermittent headaches.  Intermittent tremor in her hands, B "like I'm shaky all over."  Some leg cramps.  Horizontal creases in multiple nails bilaterally.  Weight is up.  Memory seems to be worse recently per patient report.   Safe at home.  Work is tolerable.   No sig upheaval recently.  More irritable recently.  She feels colder recently.    No chest pain.  Some cough.    Meds, vitals, and allergies reviewed.   ROS: See HPI.  Otherwise, noncontributory.  GEN: nad, alert and oriented HEENT: mucous membranes moist NECK: supple w/o LA, no TMG CV: rrr. PULM: ctab, no inc wob ABD: soft, +bs EXT: no edema SKIN: no acute rash No tremor during exam.  Horizontal creases in multiple fingernails bilaterally

## 2013-07-18 ENCOUNTER — Ambulatory Visit: Payer: 59 | Admitting: Family Medicine

## 2013-07-24 ENCOUNTER — Ambulatory Visit: Payer: Self-pay | Admitting: Family Medicine

## 2013-07-25 ENCOUNTER — Ambulatory Visit: Payer: 59 | Admitting: Family Medicine

## 2013-07-25 ENCOUNTER — Encounter: Payer: Self-pay | Admitting: Family Medicine

## 2013-07-25 ENCOUNTER — Ambulatory Visit (INDEPENDENT_AMBULATORY_CARE_PROVIDER_SITE_OTHER): Payer: 59 | Admitting: Family Medicine

## 2013-07-25 VITALS — BP 100/70 | HR 75 | Temp 98.1°F | Ht 65.5 in | Wt 162.2 lb

## 2013-07-25 DIAGNOSIS — K219 Gastro-esophageal reflux disease without esophagitis: Secondary | ICD-10-CM

## 2013-07-25 DIAGNOSIS — F411 Generalized anxiety disorder: Secondary | ICD-10-CM

## 2013-07-25 DIAGNOSIS — F410 Panic disorder [episodic paroxysmal anxiety] without agoraphobia: Secondary | ICD-10-CM

## 2013-07-25 DIAGNOSIS — R5381 Other malaise: Secondary | ICD-10-CM

## 2013-07-25 DIAGNOSIS — R0789 Other chest pain: Secondary | ICD-10-CM

## 2013-07-25 MED ORDER — VENLAFAXINE HCL ER 37.5 MG PO CP24: ORAL_CAPSULE | ORAL | Status: DC

## 2013-07-25 MED ORDER — ALPRAZOLAM 0.25 MG PO TABS: 0.2500 mg | ORAL_TABLET | Freq: Every day | ORAL | Status: DC | PRN

## 2013-07-25 NOTE — Assessment & Plan Note (Signed)
Non exertional. Likely secondary to anxiety.

## 2013-07-25 NOTE — Patient Instructions (Addendum)
Can start b12 1000 mcg OTC daily.  Rest, stress reduction and relaxation. Alprazolam as needed daily for panic attacks. Start venlafaxine 37.5 mg daily, if tolerated increase to 2 tabs at bedtime. Start prilosec OTC 2 x 20 mg daily for reflux.  Follow up in 1 month for mood, GERD, fatigue.

## 2013-07-25 NOTE — Assessment & Plan Note (Signed)
Start venlafaxine, titrate up as tolerated. Use alprazolam prn.

## 2013-07-25 NOTE — Assessment & Plan Note (Signed)
Treat with prilosec 40 mg daily and trigger avoidance.

## 2013-07-25 NOTE — Progress Notes (Signed)
Pre-visit discussion using our clinic review tool. No additional management support is needed unless otherwise documented below in the visit note.  

## 2013-07-25 NOTE — Progress Notes (Signed)
   Subjective:    Patient ID: Rebecca Allen, female    DOB: [birth year], 47 y.o.   MRN: [id number]  HPI 47 year old female pt recently seen By. Dr. Para March for n onspecific fatigue. TSH , cbc, CMET in nml range.  Pt returns today with continued fatigue. She has been having extreme fatigue since the end of November. Had been feeling shaky and ill.. CBG was 88.  She has a history of anxiety and depression She feels like she is nervous about something constantly.  She is under a lot of stress. Work , family, daughters, holidays  She has been having panic attacks. She is worrying nonstop. Occ headache, but recently treated sinus infection ( Z-pak).. Dizziness resolved with treatment of this.   She continues to have chest pain off and on chronically. Assocaited with panic stressful thoughts. No exertional chest pain.  Neg CAD work up in past years. Nml ECHO, nml EKG.  She continues to smoke.   Review of Systems  HENT: Negative for ear pain.   Eyes: Positive for pain.  Respiratory: Positive for shortness of breath.   Cardiovascular: Positive for chest pain.       She has also been feeling heartburn.  Gastrointestinal: Negative for abdominal pain.  Genitourinary: Negative for dysuria.  Neurological: Positive for headaches. Negative for tremors, syncope, facial asymmetry and light-headedness.       Objective:   Physical Exam  Constitutional: Vital signs are normal. She appears well-developed and well-nourished. She is cooperative.  Non-toxic appearance. She does not appear ill. No distress.  HENT:  Head: Normocephalic.  Right Ear: Hearing, tympanic membrane, external ear and ear canal normal. Tympanic membrane is not erythematous, not retracted and not bulging.  Left Ear: Hearing, tympanic membrane, external ear and ear canal normal. Tympanic membrane is not erythematous, not retracted and not bulging.  Nose: No mucosal edema or rhinorrhea. Right sinus exhibits no maxillary  sinus tenderness and no frontal sinus tenderness. Left sinus exhibits no maxillary sinus tenderness and no frontal sinus tenderness.  Mouth/Throat: Uvula is midline, oropharynx is clear and moist and mucous membranes are normal.  Eyes: Conjunctivae, EOM and lids are normal. Pupils are equal, round, and reactive to light. Lids are everted and swept, no foreign bodies found.  Neck: Trachea normal and normal range of motion. Neck supple. Carotid bruit is not present. No mass and no thyromegaly present.  Cardiovascular: Normal rate, regular rhythm, S1 normal, S2 normal, normal heart sounds, intact distal pulses and normal pulses.  Exam reveals no gallop and no friction rub.   No murmur heard. Pulmonary/Chest: Effort normal and breath sounds normal. Not tachypneic. No respiratory distress. She has no decreased breath sounds. She has no wheezes. She has no rhonchi. She has no rales.  Abdominal: Soft. Normal appearance and bowel sounds are normal. There is no tenderness.  Neurological: She is alert.  Skin: Skin is warm, dry and intact. No rash noted.  Psychiatric: Her speech is normal. Judgment and thought content normal. Her mood appears not anxious. She is agitated. Cognition and memory are normal. She does not exhibit a depressed mood. She expresses no suicidal plans and no homicidal plans.          Assessment & Plan:

## 2013-07-25 NOTE — Assessment & Plan Note (Addendum)
nml labs. EKG: nml. Likely secondary to mood.

## 2013-10-12 ENCOUNTER — Telehealth: Payer: Self-pay

## 2013-10-12 NOTE — Telephone Encounter (Signed)
Agreed, pt needs appt for re-eval/ change in treatment.

## 2013-10-12 NOTE — Telephone Encounter (Signed)
Pt was seen in 07/2013 and alprazolam given for panic attacks; pt said alprazolam helps but pt still has 3-4 panic attacks per week. Pt travels and bad weather driving brings on panic attacks. Pt was to f/u in one month with Dr Diona Browner when seen 07/2013. Pt has med and scheduled f/u appt with Dr Diona Browner on 10/13/13.

## 2013-10-13 ENCOUNTER — Ambulatory Visit: Payer: 59 | Admitting: Family Medicine

## 2013-10-17 ENCOUNTER — Ambulatory Visit (INDEPENDENT_AMBULATORY_CARE_PROVIDER_SITE_OTHER): Payer: 59 | Admitting: Family Medicine

## 2013-10-17 ENCOUNTER — Telehealth: Payer: Self-pay | Admitting: Family Medicine

## 2013-10-17 ENCOUNTER — Encounter: Payer: Self-pay | Admitting: Family Medicine

## 2013-10-17 VITALS — BP 96/70 | HR 73 | Temp 98.3°F | Ht 65.5 in | Wt 161.8 lb

## 2013-10-17 DIAGNOSIS — F411 Generalized anxiety disorder: Secondary | ICD-10-CM

## 2013-10-17 DIAGNOSIS — F410 Panic disorder [episodic paroxysmal anxiety] without agoraphobia: Secondary | ICD-10-CM

## 2013-10-17 MED ORDER — ALPRAZOLAM 0.25 MG PO TABS: 0.2500 mg | ORAL_TABLET | Freq: Every day | ORAL | Status: DC | PRN

## 2013-10-17 NOTE — Progress Notes (Signed)
   Subjective:    Patient ID: Rebecca Allen, female    DOB: [birth year], 48 y.o.   MRN: [id number]  HPI   48 year old female presents for follow up for generalized anxiety and panic attacks.  She continues to have panic attacks but not daily.  Shaky and nervous with weather.   She has been getting out  Walking 3-4 times a week.  No depression, No SI.   She was intolerant of venlafaxine .Marland Kitchen Severe nausea.   She is using alprazolam 1/2-1 tab few times a week. She states this has helped her a lot.  She is followed by cardiology for her acute chest pain.  Wt Readings from Last 3 Encounters:  10/17/13 161 lb 12 oz (73.369 kg)  07/25/13 162 lb 4 oz (73.596 kg)  07/14/13 160 lb 8 oz (72.802 kg)         Review of Systems  Constitutional: Negative for fever and fatigue.  HENT: Negative for ear pain.   Eyes: Negative for pain.  Respiratory: Negative for chest tightness and shortness of breath.   Cardiovascular: Negative for chest pain, palpitations and leg swelling.  Gastrointestinal: Negative for abdominal pain.  Genitourinary: Negative for dysuria.       Objective:   Physical Exam  Constitutional: Vital signs are normal. She appears well-developed and well-nourished. She is cooperative.  Non-toxic appearance. She does not appear ill. No distress.  HENT:  Head: Normocephalic.  Right Ear: Hearing, tympanic membrane, external ear and ear canal normal. Tympanic membrane is not erythematous, not retracted and not bulging.  Left Ear: Hearing, tympanic membrane, external ear and ear canal normal. Tympanic membrane is not erythematous, not retracted and not bulging.  Nose: No mucosal edema or rhinorrhea. Right sinus exhibits no maxillary sinus tenderness and no frontal sinus tenderness. Left sinus exhibits no maxillary sinus tenderness and no frontal sinus tenderness.  Mouth/Throat: Uvula is midline, oropharynx is clear and moist and mucous membranes are normal.  Eyes:  Conjunctivae, EOM and lids are normal. Pupils are equal, round, and reactive to light. Lids are everted and swept, no foreign bodies found.  Neck: Trachea normal and normal range of motion. Neck supple. Carotid bruit is not present. No mass and no thyromegaly present.  Cardiovascular: Normal rate, regular rhythm, S1 normal, S2 normal, normal heart sounds, intact distal pulses and normal pulses.  Exam reveals no gallop and no friction rub.   No murmur heard. Pulmonary/Chest: Effort normal and breath sounds normal. Not tachypneic. No respiratory distress. She has no decreased breath sounds. She has no wheezes. She has no rhonchi. She has no rales.  Abdominal: Soft. Normal appearance and bowel sounds are normal. There is no tenderness.  Neurological: She is alert.  Skin: Skin is warm, dry and intact. No rash noted.  Psychiatric: Her speech is normal and behavior is normal. Judgment and thought content normal. Her mood appears not anxious. Cognition and memory are normal. She does not exhibit a depressed mood.          Assessment & Plan:

## 2013-10-17 NOTE — Assessment & Plan Note (Signed)
Improved control with stress reduction and relaxation. Using alprazolam prn.  No UDS because using low dose in limited fashion.

## 2013-10-17 NOTE — Telephone Encounter (Signed)
Relevant patient education assigned to patient using Emmi. ° °

## 2013-10-17 NOTE — Progress Notes (Signed)
Pre visit review using our clinic review tool, if applicable. No additional management support is needed unless otherwise documented below in the visit note. 

## 2013-10-17 NOTE — Patient Instructions (Addendum)
Follow up 6 months with labs prior for  anxiety and high cholesterol.

## 2014-04-11 ENCOUNTER — Telehealth: Payer: Self-pay | Admitting: Family Medicine

## 2014-04-11 DIAGNOSIS — E785 Hyperlipidemia, unspecified: Secondary | ICD-10-CM

## 2014-04-11 NOTE — Telephone Encounter (Signed)
Message copied by Jinny Sanders on Wed Apr 11, 2014 10:35 PM ------      Message from: Ellamae Sia      Created: Fri Apr 06, 2014  9:47 AM      Regarding: Lab orders for Thursday, 9.3.15       Lab orders for a 6 month f/u ------

## 2014-04-12 ENCOUNTER — Other Ambulatory Visit: Payer: 59

## 2014-04-12 ENCOUNTER — Emergency Department: Payer: Self-pay | Admitting: Emergency Medicine

## 2014-04-12 LAB — CBC
HCT: 44.2 % (ref 35.0–47.0)
HGB: 14.5 g/dL (ref 12.0–16.0)
MCH: 30.7 pg (ref 26.0–34.0)
MCHC: 32.7 g/dL (ref 32.0–36.0)
MCV: 94 fL (ref 80–100)
Platelet: 234 10*3/uL (ref 150–440)
RBC: 4.72 10*6/uL (ref 3.80–5.20)
RDW: 13 % (ref 11.5–14.5)
WBC: 8.8 10*3/uL (ref 3.6–11.0)

## 2014-04-12 LAB — COMPREHENSIVE METABOLIC PANEL
ALBUMIN: 4 g/dL (ref 3.4–5.0)
Alkaline Phosphatase: 63 U/L
Anion Gap: 4 — ABNORMAL LOW (ref 7–16)
BUN: 21 mg/dL — AB (ref 7–18)
Bilirubin,Total: 0.2 mg/dL (ref 0.2–1.0)
Calcium, Total: 8.7 mg/dL (ref 8.5–10.1)
Chloride: 109 mmol/L — ABNORMAL HIGH (ref 98–107)
Co2: 27 mmol/L (ref 21–32)
Creatinine: 0.87 mg/dL (ref 0.60–1.30)
EGFR (African American): >60
Glucose: 93 mg/dL (ref 65–99)
Osmolality: 282 (ref 275–301)
Potassium: 4 mmol/L (ref 3.5–5.1)
SGOT(AST): 65 U/L — ABNORMAL HIGH (ref 15–37)
SGPT (ALT): 71 U/L — ABNORMAL HIGH
Sodium: 140 mmol/L (ref 136–145)
Total Protein: 7.2 g/dL (ref 6.4–8.2)

## 2014-04-12 LAB — TROPONIN I: Troponin-I: <0.02 ng/mL

## 2014-04-12 LAB — CK TOTAL AND CKMB (NOT AT ARMC)
CK, TOTAL: 88 U/L
CK-MB: 0.8 ng/mL (ref 0.5–3.6)

## 2014-04-13 LAB — TROPONIN I

## 2014-04-13 LAB — D-DIMER(ARMC): D-Dimer: 171 ng/ml

## 2014-04-19 ENCOUNTER — Ambulatory Visit: Payer: 59 | Admitting: Family Medicine

## 2014-05-23 ENCOUNTER — Emergency Department (HOSPITAL_COMMUNITY)
Admission: EM | Admit: 2014-05-23 | Discharge: 2014-05-23 | Disposition: A | Discharge status: Home / Self Care | Payer: 59 | Attending: Emergency Medicine | Admitting: Emergency Medicine

## 2014-05-23 ENCOUNTER — Encounter (HOSPITAL_COMMUNITY): Payer: Self-pay | Admitting: Emergency Medicine

## 2014-05-23 ENCOUNTER — Emergency Department (HOSPITAL_COMMUNITY): Payer: 59

## 2014-05-23 DIAGNOSIS — S4991XA Unspecified injury of right shoulder and upper arm, initial encounter: Secondary | ICD-10-CM | POA: Diagnosis not present

## 2014-05-23 DIAGNOSIS — Z8719 Personal history of other diseases of the digestive system: Secondary | ICD-10-CM | POA: Diagnosis not present

## 2014-05-23 DIAGNOSIS — Z880 Allergy status to penicillin: Secondary | ICD-10-CM | POA: Diagnosis not present

## 2014-05-23 DIAGNOSIS — J449 Chronic obstructive pulmonary disease, unspecified: Secondary | ICD-10-CM | POA: Insufficient documentation

## 2014-05-23 DIAGNOSIS — Y92410 Unspecified street and highway as the place of occurrence of the external cause: Secondary | ICD-10-CM | POA: Insufficient documentation

## 2014-05-23 DIAGNOSIS — S4992XA Unspecified injury of left shoulder and upper arm, initial encounter: Secondary | ICD-10-CM | POA: Insufficient documentation

## 2014-05-23 DIAGNOSIS — Z8639 Personal history of other endocrine, nutritional and metabolic disease: Secondary | ICD-10-CM | POA: Diagnosis not present

## 2014-05-23 DIAGNOSIS — Z8659 Personal history of other mental and behavioral disorders: Secondary | ICD-10-CM | POA: Insufficient documentation

## 2014-05-23 DIAGNOSIS — S199XXA Unspecified injury of neck, initial encounter: Secondary | ICD-10-CM | POA: Diagnosis present

## 2014-05-23 DIAGNOSIS — S39012A Strain of muscle, fascia and tendon of lower back, initial encounter: Secondary | ICD-10-CM | POA: Diagnosis not present

## 2014-05-23 DIAGNOSIS — Z720 Tobacco use: Secondary | ICD-10-CM | POA: Insufficient documentation

## 2014-05-23 DIAGNOSIS — Y9389 Activity, other specified: Secondary | ICD-10-CM | POA: Insufficient documentation

## 2014-05-23 DIAGNOSIS — S161XXA Strain of muscle, fascia and tendon at neck level, initial encounter: Secondary | ICD-10-CM | POA: Insufficient documentation

## 2014-05-23 MED ORDER — HYDROCODONE-ACETAMINOPHEN 5-325 MG PO TABS
1.0000 | ORAL_TABLET | Freq: Once | ORAL | Status: AC
  Administered 2014-05-23: 1 via ORAL

## 2014-05-23 MED ORDER — CYCLOBENZAPRINE HCL 10 MG PO TABS: 10.0000 mg | ORAL_TABLET | Freq: Three times a day (TID) | ORAL | Status: DC | PRN

## 2014-05-23 MED ORDER — IBUPROFEN 400 MG PO TABS
800.0000 mg | ORAL_TABLET | Freq: Once | ORAL | Status: AC
  Administered 2014-05-23: 800 mg via ORAL
  Filled 2014-05-23: qty 2

## 2014-05-23 MED ORDER — HYDROCODONE-ACETAMINOPHEN 5-325 MG PO TABS: 1.0000 | ORAL_TABLET | Freq: Four times a day (QID) | ORAL | Status: DC | PRN

## 2014-05-23 MED ORDER — IBUPROFEN 800 MG PO TABS: 800.0000 mg | ORAL_TABLET | Freq: Three times a day (TID) | ORAL | Status: DC | PRN
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
## 2014-05-23 NOTE — Discharge Instructions (Signed)
Return here as needed. Your x-rays were normal. Ice and heat on the areas that are sore.
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
## 2014-05-24 NOTE — ED Provider Notes (Signed)
Medical screening examination/treatment/procedure(s) were performed by non-physician practitioner and as supervising physician I was immediately available for consultation/collaboration.   EKG Interpretation None        Evelina Bucy, MD 05/24/14 [phone]

## 2014-05-29 ENCOUNTER — Encounter: Payer: Self-pay | Admitting: Family Medicine

## 2014-05-29 ENCOUNTER — Ambulatory Visit (INDEPENDENT_AMBULATORY_CARE_PROVIDER_SITE_OTHER): Payer: 59 | Admitting: Family Medicine

## 2014-05-29 VITALS — BP 90/70 | HR 75 | Temp 98.3°F | Ht 65.5 in | Wt 166.5 lb

## 2014-05-29 DIAGNOSIS — S29012A Strain of muscle and tendon of back wall of thorax, initial encounter: Secondary | ICD-10-CM | POA: Insufficient documentation

## 2014-05-29 DIAGNOSIS — S46812A Strain of other muscles, fascia and tendons at shoulder and upper arm level, left arm, initial encounter: Secondary | ICD-10-CM

## 2014-05-29 NOTE — Assessment & Plan Note (Signed)
Heat, NSAIDs as tolerated, muscle relaxant, refer to PT.

## 2014-05-29 NOTE — Patient Instructions (Signed)
Use ibuprofen as tolerated. Muscle relaxant at night. Heat on back. Stop at front desk to set up referral to PT. Look into massage therapy. Call if not improving in 2 weeks.
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
## 2014-05-29 NOTE — Progress Notes (Signed)
Pre visit review using our clinic review tool, if applicable. No additional management support is needed unless otherwise documented below in the visit note. 

## 2014-06-13 ENCOUNTER — Encounter: Payer: Self-pay | Admitting: Family Medicine

## 2014-07-10 ENCOUNTER — Encounter: Payer: Self-pay | Admitting: Family Medicine

## 2014-07-18 ENCOUNTER — Encounter: Payer: Self-pay | Admitting: Family Medicine

## 2014-07-18 ENCOUNTER — Ambulatory Visit (INDEPENDENT_AMBULATORY_CARE_PROVIDER_SITE_OTHER): Payer: 59 | Admitting: Family Medicine

## 2014-07-18 VITALS — BP 104/70 | HR 70 | Temp 98.2°F | Ht 65.5 in | Wt 166.2 lb

## 2014-07-18 DIAGNOSIS — H6980 Other specified disorders of Eustachian tube, unspecified ear: Secondary | ICD-10-CM | POA: Insufficient documentation

## 2014-07-18 DIAGNOSIS — H6983 Other specified disorders of Eustachian tube, bilateral: Secondary | ICD-10-CM

## 2014-07-18 NOTE — Assessment & Plan Note (Signed)
From viral uri suspected (in smoker) Recommend inc flonase ns to bid for a week -then return to qd  Breathe steam If tolerated-trial of sudafed/pseudoephedrine for congestion (disc poss side eff) Update if not starting to improve in a week or if worsening  - esp if worse ear pain or fever

## 2014-07-18 NOTE — Progress Notes (Signed)
Subjective:    Patient ID: Rebecca Allen, female    DOB: February 27, 1966, 48 y.o.   MRN: [id number]  HPI Here for ear complaints   R ear pops and tender if she pushes on it - started approx Sunday      L ear - fullness and getting painful  Having difficulty hearing  Used a Qtip in R ear-pink color ? Blood  Has some drainage (post nasal)  She is dizzy also   No fever   Has ? flonase ns  Uses just when she has symptoms   Patient Active Problem List   Diagnosis Date Noted  . Trapezius muscle strain 05/29/2014  . Panic attacks 07/25/2013  . LOW BACK PAIN, CHRONIC 05/30/2010  . Generalized anxiety disorder 05/16/2009  . MALAISE AND FATIGUE 05/01/2009  . PERS HX TOBACCO USE PRESENTING HAZARDS HEALTH 05/07/2008  . THYROIDITIS, ACUTE 03/28/2008  . HYPERLIPIDEMIA 03/28/2008  . COMMON MIGRAINE 03/28/2008  . GERD 03/28/2008  . COSTOCHONDRITIS, RECURRENT 03/28/2008  . CHEST PAIN, ATYPICAL 03/28/2008   Past Medical History  Diagnosis Date  . Treadmill stress test negative for angina pectoris 12/18/2010  . Depression   . GERD (gastroesophageal reflux disease)   . Hyperlipidemia   . COPD (chronic obstructive pulmonary disease)    Past Surgical History  Procedure Laterality Date  . Partial hysterectomy  08-1999    abdominal   History  Substance Use Topics  . Smoking status: Current Every Day Smoker -- 0.30 packs/day for 30 years    Types: Cigarettes  . Smokeless tobacco: Never Used  . Alcohol Use: 0.0 oz/week    0 Not specified per week     Comment: beer ocassionally   Family History  Problem Relation Age of Onset  . COPD Mother   . Heart attack Father   . Hypertension Father   . Leukemia Brother   . Kidney failure Maternal Grandfather   . COPD Paternal Grandmother    Allergies  Allergen Reactions  . Metoclopramide Hcl     REACTION: Nervousness  . Penicillins     REACTION: Upsets stomach   Current Outpatient Prescriptions on File Prior to Visit  Medication  Sig Dispense Refill  . estradiol (VIVELLE-DOT) 0.025 MG/24HR Place 1 patch onto the skin 2 (two) times a week. Tuesday and friday    . ibuprofen (ADVIL,MOTRIN) 800 MG tablet Take 1 tablet (800 mg total) by mouth every 8 (eight) hours as needed. 21 tablet 0  . cyclobenzaprine (FLEXERIL) 10 MG tablet Take 1 tablet (10 mg total) by mouth 3 (three) times daily as needed for muscle spasms. (Patient not taking: Reported on 07/18/2014) 15 tablet 0  . HYDROcodone-acetaminophen (NORCO/VICODIN) 5-325 MG per tablet Take 1 tablet by mouth every 6 (six) hours as needed for moderate pain. (Patient not taking: Reported on 07/18/2014) 15 tablet 0   No current facility-administered medications on file prior to visit.     Review of Systems Review of Systems  Constitutional: Negative for fever, appetite change, fatigue and unexpected weight change.  ENT pos for ear discomfort and fullness and nasal congestion Eyes: Negative for pain and visual disturbance.  Respiratory: Negative for wheeze and shortness of breath.  pos for mild cough Cardiovascular: Negative for cp or palpitations    Gastrointestinal: Negative for nausea, diarrhea and constipation.  Genitourinary: Negative for urgency and frequency.  Skin: Negative for pallor or rash   Neurological: Negative for weakness, light-headedness, numbness and headaches.  Hematological: Negative for adenopathy. Does not bruise/bleed  easily.  Psychiatric/Behavioral: Negative for dysphoric mood. The patient is not nervous/anxious.         Objective:   Physical Exam  Constitutional: She appears well-developed and well-nourished. No distress.  HENT:  Head: Normocephalic and atraumatic.  Mouth/Throat: Oropharynx is clear and moist. No oropharyngeal exudate.  Nares are injected and congested   No sinus tenderness TMs are dull with small eff on R and retraction of TM on L  No erythema or bulging   Small scratch in R ear canal -not app infected  Eyes: Conjunctivae  and EOM are normal. Pupils are equal, round, and reactive to light. Right eye exhibits no discharge. Left eye exhibits no discharge.  Neck: Normal range of motion. Neck supple.  Cardiovascular: Normal rate and regular rhythm.   Pulmonary/Chest: Effort normal and breath sounds normal. No respiratory distress. She has no wheezes. She has no rales.  Diffusely distant bs   Lymphadenopathy:    She has no cervical adenopathy.  Neurological: She is alert.  Skin: Skin is warm and dry. No rash noted. No erythema.  Psychiatric: She has a normal mood and affect.          Assessment & Plan:   Problem List Items Addressed This Visit      Nervous and Auditory   ETD (eustachian tube dysfunction) - Primary    From viral uri suspected (in smoker) Recommend inc flonase ns to bid for a week -then return to qd  Breathe steam If tolerated-trial of sudafed/pseudoephedrine for congestion (disc poss side eff) Update if not starting to improve in a week or if worsening  - esp if worse ear pain or fever

## 2014-07-18 NOTE — Progress Notes (Signed)
Pre visit review using our clinic review tool, if applicable. No additional management support is needed unless otherwise documented below in the visit note. 

## 2014-07-18 NOTE — Patient Instructions (Signed)
Use your flonase 2 sprays in each nostril twice daily (am and pm) for 1 week and then go back to once daily  You can also try some sudafed (pseudoephedrine) - as directed - this will also help with congestion  Our goal is to relieve congestion and thus take pressure off of ears  Update if not starting to improve in a week or if worsening  -- if fever or severe ear pain especially

## 2014-08-17 ENCOUNTER — Ambulatory Visit (INDEPENDENT_AMBULATORY_CARE_PROVIDER_SITE_OTHER): Payer: 59 | Admitting: Family Medicine

## 2014-08-17 ENCOUNTER — Encounter: Payer: Self-pay | Admitting: Family Medicine

## 2014-08-17 VITALS — BP 112/70 | HR 86 | Temp 98.4°F | Wt 165.5 lb

## 2014-08-17 DIAGNOSIS — S46812A Strain of other muscles, fascia and tendons at shoulder and upper arm level, left arm, initial encounter: Secondary | ICD-10-CM

## 2014-08-17 MED ORDER — IBUPROFEN 200 MG PO TABS: 400.0000 mg | ORAL_TABLET | Freq: Four times a day (QID) | ORAL | Status: DC | PRN

## 2014-08-17 MED ORDER — CYCLOBENZAPRINE HCL 5 MG PO TABS: 2.5000 mg | ORAL_TABLET | Freq: Three times a day (TID) | ORAL | Status: DC | PRN

## 2014-08-17 MED ORDER — HYDROCODONE-ACETAMINOPHEN 5-325 MG PO TABS: 1.0000 | ORAL_TABLET | Freq: Four times a day (QID) | ORAL | Status: DC | PRN
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
## 2014-08-17 NOTE — Patient Instructions (Signed)
Take ibuprofen with food, use flexeril and vicodin for pain.  Sedation caution.  Rosaria Ferries will call about your referral. Take care.  Glad to see you.
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
## 2014-08-28 ENCOUNTER — Encounter: Payer: Self-pay | Admitting: Family Medicine

## 2014-09-10 ENCOUNTER — Encounter: Payer: Self-pay | Admitting: Family Medicine

## 2014-10-05 ENCOUNTER — Encounter: Payer: Self-pay | Admitting: Family Medicine

## 2014-10-05 ENCOUNTER — Ambulatory Visit (INDEPENDENT_AMBULATORY_CARE_PROVIDER_SITE_OTHER): Payer: 59 | Admitting: Family Medicine

## 2014-10-05 VITALS — BP 90/60 | HR 77 | Temp 98.3°F | Ht 65.5 in | Wt 167.5 lb

## 2014-10-05 DIAGNOSIS — F4310 Post-traumatic stress disorder, unspecified: Secondary | ICD-10-CM

## 2014-10-05 DIAGNOSIS — F410 Panic disorder [episodic paroxysmal anxiety] without agoraphobia: Secondary | ICD-10-CM

## 2014-10-05 MED ORDER — SERTRALINE HCL 25 MG PO TABS: 25.0000 mg | ORAL_TABLET | Freq: Every day | ORAL | Status: DC

## 2014-10-05 NOTE — Progress Notes (Signed)
Pre visit review using our clinic review tool, if applicable. No additional management support is needed unless otherwise documented below in the visit note. 

## 2014-10-05 NOTE — Patient Instructions (Addendum)
Start zoloft 25 mg daily at bedtime. Call if tolerating after 1-2 weks if interested in increasing to 50 mg daily. Stop at front desk for referral to counseling. Follow up in 1 month for mood.

## 2014-10-05 NOTE — Assessment & Plan Note (Signed)
Mild whipleash injury, improving with time.
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
## 2014-11-12 ENCOUNTER — Encounter (HOSPITAL_COMMUNITY): Payer: Self-pay

## 2014-11-12 ENCOUNTER — Emergency Department (HOSPITAL_COMMUNITY)
Admission: EM | Admit: 2014-11-12 | Discharge: 2014-11-12 | Disposition: A | Discharge status: Home / Self Care | Payer: 59 | Attending: Emergency Medicine | Admitting: Emergency Medicine

## 2014-11-12 ENCOUNTER — Emergency Department (HOSPITAL_COMMUNITY): Payer: 59

## 2014-11-12 DIAGNOSIS — R079 Chest pain, unspecified: Secondary | ICD-10-CM | POA: Diagnosis present

## 2014-11-12 DIAGNOSIS — Z720 Tobacco use: Secondary | ICD-10-CM | POA: Insufficient documentation

## 2014-11-12 DIAGNOSIS — Z880 Allergy status to penicillin: Secondary | ICD-10-CM | POA: Insufficient documentation

## 2014-11-12 DIAGNOSIS — R0789 Other chest pain: Secondary | ICD-10-CM | POA: Diagnosis not present

## 2014-11-12 DIAGNOSIS — M542 Cervicalgia: Secondary | ICD-10-CM | POA: Insufficient documentation

## 2014-11-12 DIAGNOSIS — F329 Major depressive disorder, single episode, unspecified: Secondary | ICD-10-CM | POA: Insufficient documentation

## 2014-11-12 DIAGNOSIS — Z8639 Personal history of other endocrine, nutritional and metabolic disease: Secondary | ICD-10-CM | POA: Diagnosis not present

## 2014-11-12 DIAGNOSIS — Z7982 Long term (current) use of aspirin: Secondary | ICD-10-CM | POA: Insufficient documentation

## 2014-11-12 DIAGNOSIS — K219 Gastro-esophageal reflux disease without esophagitis: Secondary | ICD-10-CM | POA: Diagnosis not present

## 2014-11-12 DIAGNOSIS — R002 Palpitations: Secondary | ICD-10-CM | POA: Diagnosis not present

## 2014-11-12 DIAGNOSIS — Z79899 Other long term (current) drug therapy: Secondary | ICD-10-CM | POA: Diagnosis not present

## 2014-11-12 DIAGNOSIS — J441 Chronic obstructive pulmonary disease with (acute) exacerbation: Secondary | ICD-10-CM | POA: Diagnosis not present

## 2014-11-12 DIAGNOSIS — M549 Dorsalgia, unspecified: Secondary | ICD-10-CM

## 2014-11-12 LAB — CBC WITH DIFFERENTIAL/PLATELET
BASOS ABS: 0 10*3/uL (ref 0.0–0.1)
BASOS PCT: 0 % (ref 0–1)
EOS ABS: 0.2 10*3/uL (ref 0.0–0.7)
Eosinophils Relative: 3 % (ref 0–5)
HEMATOCRIT: 42.5 % (ref 36.0–46.0)
Hemoglobin: 14 g/dL (ref 12.0–15.0)
Lymphocytes Relative: 32 % (ref 12–46)
Lymphs Abs: 1.9 10*3/uL (ref 0.7–4.0)
MCH: 30.5 pg (ref 26.0–34.0)
MCHC: 32.9 g/dL (ref 30.0–36.0)
MCV: 92.6 fL (ref 78.0–100.0)
Monocytes Absolute: 0.5 10*3/uL (ref 0.1–1.0)
Monocytes Relative: 9 % (ref 3–12)
NEUTROS ABS: 3.3 10*3/uL (ref 1.7–7.7)
NEUTROS PCT: 56 % (ref 43–77)
PLATELETS: 235 10*3/uL (ref 150–400)
RBC: 4.59 MIL/uL (ref 3.87–5.11)
RDW: 12.8 % (ref 11.5–15.5)
WBC: 5.9 10*3/uL (ref 4.0–10.5)

## 2014-11-12 LAB — BASIC METABOLIC PANEL
Anion gap: 9 (ref 5–15)
BUN: 14 mg/dL (ref 6–23)
CO2: 26 mmol/L (ref 19–32)
Calcium: 9.8 mg/dL (ref 8.4–10.5)
Chloride: 103 mmol/L (ref 96–112)
Creatinine, Ser: 0.86 mg/dL (ref 0.50–1.10)
GFR calc Af Amer: >90 mL/min (ref >90)
GFR calc non Af Amer: 79 mL/min — ABNORMAL LOW (ref >90)
GLUCOSE: 95 mg/dL (ref 70–99)
POTASSIUM: 4.5 mmol/L (ref 3.5–5.1)
SODIUM: 138 mmol/L (ref 135–145)

## 2014-11-12 LAB — HEPATIC FUNCTION PANEL
ALK PHOS: 54 U/L (ref 39–117)
ALT: 50 U/L — AB (ref 0–35)
AST: 35 U/L (ref 0–37)
Albumin: 4.2 g/dL (ref 3.5–5.2)
BILIRUBIN INDIRECT: 0.6 mg/dL (ref 0.3–0.9)
Bilirubin, Direct: 0.2 mg/dL (ref 0.0–0.5)
TOTAL PROTEIN: 6.6 g/dL (ref 6.0–8.3)
Total Bilirubin: 0.8 mg/dL (ref 0.3–1.2)

## 2014-11-12 LAB — I-STAT TROPONIN, ED: Troponin i, poc: 0 ng/mL (ref 0.00–0.08)

## 2014-11-12 LAB — LIPASE, BLOOD: Lipase: 28 U/L (ref 11–59)

## 2014-11-12 LAB — D-DIMER, QUANTITATIVE: D-Dimer, Quant: <0.27 ug/mL-FEU (ref 0.00–0.48)

## 2014-11-12 MED ORDER — GI COCKTAIL ~~LOC~~
30.0000 mL | Freq: Once | ORAL | Status: AC
  Administered 2014-11-12: 30 mL via ORAL
  Filled 2014-11-12: qty 30

## 2014-11-12 MED ORDER — ACETAMINOPHEN 325 MG PO TABS
650.0000 mg | ORAL_TABLET | Freq: Once | ORAL | Status: AC
  Administered 2014-11-12: 650 mg via ORAL
  Filled 2014-11-12: qty 2

## 2014-11-12 MED ORDER — ONDANSETRON HCL 4 MG/2ML IJ SOLN
4.0000 mg | Freq: Once | INTRAMUSCULAR | Status: AC
  Administered 2014-11-12: 4 mg via INTRAVENOUS
  Filled 2014-11-12: qty 2

## 2014-11-12 MED ORDER — METHOCARBAMOL 500 MG PO TABS: 500.0000 mg | ORAL_TABLET | Freq: Two times a day (BID) | ORAL | Status: DC | PRN

## 2014-11-12 MED ORDER — LORAZEPAM 2 MG/ML IJ SOLN
1.0000 mg | Freq: Once | INTRAMUSCULAR | Status: AC
  Administered 2014-11-12: 1 mg via INTRAVENOUS
  Filled 2014-11-12: qty 1

## 2014-11-12 MED ORDER — OMEPRAZOLE 20 MG PO CPDR: 20.0000 mg | DELAYED_RELEASE_CAPSULE | Freq: Every day | ORAL | Status: DC

## 2014-11-12 MED ORDER — MORPHINE SULFATE 4 MG/ML IJ SOLN
4.0000 mg | Freq: Once | INTRAMUSCULAR | Status: AC
  Administered 2014-11-12: 4 mg via INTRAVENOUS
  Filled 2014-11-12: qty 1

## 2014-11-12 NOTE — Discharge Instructions (Signed)
Chest Pain (Nonspecific) °It is often hard to give a specific diagnosis for the cause of chest pain. There is always a chance that your pain could be related to something serious, such as a heart attack or a blood clot in the lungs. You need to follow up with your health care provider for further evaluation. °CAUSES  °· Heartburn. °· Pneumonia or bronchitis. °· Anxiety or stress. °· Inflammation around your heart (pericarditis) or lung (pleuritis or pleurisy). °· A blood clot in the lung. °· A collapsed lung (pneumothorax). It can develop suddenly on its own (spontaneous pneumothorax) or from trauma to the chest. °· Shingles infection (herpes zoster virus). °The chest wall is composed of bones, muscles, and cartilage. Any of these can be the source of the pain. °· The bones can be bruised by injury. °· The muscles or cartilage can be strained by coughing or overwork. °· The cartilage can be affected by inflammation and become sore (costochondritis). °DIAGNOSIS  °Lab tests or other studies may be needed to find the cause of your pain. Your health care provider may have you take a test called an ambulatory electrocardiogram (ECG). An ECG records your heartbeat patterns over a 24-hour period. You may also have other tests, such as: °· Transthoracic echocardiogram (TTE). During echocardiography, sound waves are used to evaluate how blood flows through your heart. °· Transesophageal echocardiogram (TEE). °· Cardiac monitoring. This allows your health care provider to monitor your heart rate and rhythm in real time. °· Holter monitor. This is a portable device that records your heartbeat and can help diagnose heart arrhythmias. It allows your health care provider to track your heart activity for several days, if needed. °· Stress tests by exercise or by giving medicine that makes the heart beat faster. °TREATMENT  °· Treatment depends on what may be causing your chest pain. Treatment may include: °· Acid blockers for  heartburn. °· Anti-inflammatory medicine. °· Pain medicine for inflammatory conditions. °· Antibiotics if an infection is present. °· You may be advised to change lifestyle habits. This includes stopping smoking and avoiding alcohol, caffeine, and chocolate. °· You may be advised to keep your head raised (elevated) when sleeping. This reduces the chance of acid going backward from your stomach into your esophagus. °Most of the time, nonspecific chest pain will improve within 2-3 days with rest and mild pain medicine.  °HOME CARE INSTRUCTIONS  °· If antibiotics were prescribed, take them as directed. Finish them even if you start to feel better. °· For the next few days, avoid physical activities that bring on chest pain. Continue physical activities as directed. °· Do not use any tobacco products, including cigarettes, chewing tobacco, or electronic cigarettes. °· Avoid drinking alcohol. °· Only take medicine as directed by your health care provider. °· Follow your health care provider's suggestions for further testing if your chest pain does not go away. °· Keep any follow-up appointments you made. If you do not go to an appointment, you could develop lasting (chronic) problems with pain. If there is any problem keeping an appointment, call to reschedule. °SEEK MEDICAL CARE IF:  °· Your chest pain does not go away, even after treatment. °· You have a rash with blisters on your chest. °· You have a fever. °SEEK IMMEDIATE MEDICAL CARE IF:  °· You have increased chest pain or pain that spreads to your arm, neck, jaw, back, or abdomen. °· You have shortness of breath. °· You have an increasing cough, or you cough   up blood.  You have severe back or abdominal pain.  You feel nauseous or vomit.  You have severe weakness.  You faint.  You have chills. This is an emergency. Do not wait to see if the pain will go away. Get medical help at once. Call your local emergency services (911 in [place] Do not drive  yourself to the hospital. MAKE SURE YOU:   Understand these instructions.  Will watch your condition.  Will get help right away if you are not doing well or get worse. Document Released: 05/06/2005 Document Revised: 08/01/2013 Document Reviewed: 03/01/2008 [facility] Patient Information 2015 [place], [place]. This information is not intended to replace advice given to you by your health care provider. Make sure you discuss any questions you have with your health care provider. Gastroesophageal Reflux Disease, Adult Gastroesophageal reflux disease (GERD) happens when acid from your stomach flows up into the esophagus. When acid comes in contact with the esophagus, the acid causes soreness (inflammation) in the esophagus. Over time, GERD may create small holes (ulcers) in the lining of the esophagus. CAUSES   Increased body weight. This puts pressure on the stomach, making acid rise from the stomach into the esophagus.  Smoking. This increases acid production in the stomach.  Drinking alcohol. This causes decreased pressure in the lower esophageal sphincter (valve or ring of muscle between the esophagus and stomach), allowing acid from the stomach into the esophagus.  Late evening meals and a full stomach. This increases pressure and acid production in the stomach.  A malformed lower esophageal sphincter. Sometimes, no cause is found. SYMPTOMS   Burning pain in the lower part of the mid-chest behind the breastbone and in the mid-stomach area. This may occur twice a week or more often.  Trouble swallowing.  Sore throat.  Dry cough.  Asthma-like symptoms including chest tightness, shortness of breath, or wheezing. DIAGNOSIS  Your caregiver may be able to diagnose GERD based on your symptoms. In some cases, X-rays and other tests may be done to check for complications or to check the condition of your stomach and esophagus. TREATMENT  Your caregiver may recommend over-the-counter or  prescription medicines to help decrease acid production. Ask your caregiver before starting or adding any new medicines.  HOME CARE INSTRUCTIONS   Change the factors that you can control. Ask your caregiver for guidance concerning weight loss, quitting smoking, and alcohol consumption.  Avoid foods and drinks that make your symptoms worse, such as:  Caffeine or alcoholic drinks.  Chocolate.  Peppermint or mint flavorings.  Garlic and onions.  Spicy foods.  Citrus fruits, such as oranges, lemons, or limes.  Tomato-based foods such as sauce, chili, salsa, and pizza.  Fried and fatty foods.  Avoid lying down for the 3 hours prior to your bedtime or prior to taking a nap.  Eat small, frequent meals instead of large meals.  Wear loose-fitting clothing. Do not wear anything tight around your waist that causes pressure on your stomach.  Raise the head of your bed 6 to 8 inches with wood blocks to help you sleep. Extra pillows will not help.  Only take over-the-counter or prescription medicines for pain, discomfort, or fever as directed by your caregiver.  Do not take aspirin, ibuprofen, or other nonsteroidal anti-inflammatory drugs (NSAIDs). SEEK IMMEDIATE MEDICAL CARE IF:   You have pain in your arms, neck, jaw, teeth, or back.  Your pain increases or changes in intensity or duration.  You develop nausea, vomiting, or sweating (diaphoresis).  You develop shortness of breath, or you faint.  Your vomit is green, yellow, black, or looks like coffee grounds or blood.  Your stool is red, bloody, or black. These symptoms could be signs of other problems, such as heart disease, gastric bleeding, or esophageal bleeding. MAKE SURE YOU:   Understand these instructions.  Will watch your condition.  Will get help right away if you are not doing well or get worse. Document Released: 05/06/2005 Document Revised: 10/19/2011 Document Reviewed: 02/13/2011 [facility] Patient  Information 2015 [place], [place]. This information is not intended to replace advice given to you by your health care provider. Make sure you discuss any questions you have with your health care provider. Back Pain, Adult Low back pain is very common. About 1 in 5 people have back pain.The cause of low back pain is rarely dangerous. The pain often gets better over time.About half of people with a sudden onset of back pain feel better in just 2 weeks. About 8 in 10 people feel better by 6 weeks.  CAUSES Some common causes of back pain include:  Strain of the muscles or ligaments supporting the spine.  Wear and tear (degeneration) of the spinal discs.  Arthritis.  Direct injury to the back. DIAGNOSIS Most of the time, the direct cause of low back pain is not known.However, back pain can be treated effectively even when the exact cause of the pain is unknown.Answering your caregiver's questions about your overall health and symptoms is one of the most accurate ways to make sure the cause of your pain is not dangerous. If your caregiver needs more information, he or she may order lab work or imaging tests (X-rays or MRIs).However, even if imaging tests show changes in your back, this usually does not require surgery. HOME CARE INSTRUCTIONS For many people, back pain returns.Since low back pain is rarely dangerous, it is often a condition that people can learn to [facility] their own.   Remain active. It is stressful on the back to sit or stand in one place. Do not sit, drive, or stand in one place for more than 30 minutes at a time. Take short walks on level surfaces as soon as pain allows.Try to increase the length of time you walk each day.  Do not stay in bed.Resting more than 1 or 2 days can delay your recovery.  Do not avoid exercise or work.Your body is made to move.It is not dangerous to be active, even though your back may hurt.Your back will likely heal faster if you return to  being active before your pain is gone.  Pay attention to your body when you bend and lift. Many people have less discomfortwhen lifting if they bend their knees, keep the load close to their bodies,and avoid twisting. Often, the most comfortable positions are those that put less stress on your recovering back.  Find a comfortable position to sleep. Use a firm mattress and lie on your side with your knees slightly bent. If you lie on your back, put a pillow under your knees.  Only take over-the-counter or prescription medicines as directed by your caregiver. Over-the-counter medicines to reduce pain and inflammation are often the most helpful.Your caregiver may prescribe muscle relaxant drugs.These medicines help dull your pain so you can more quickly return to your normal activities and healthy exercise.  Put ice on the injured area.  Put ice in a plastic bag.  Place a towel between your skin and the bag.  Leave the  ice on for 15-20 minutes, 03-04 times a day for the first 2 to 3 days. After that, ice and heat may be alternated to reduce pain and spasms.  Ask your caregiver about trying back exercises and gentle massage. This may be of some benefit.  Avoid feeling anxious or stressed.Stress increases muscle tension and can worsen back pain.It is important to recognize when you are anxious or stressed and learn ways to manage it.Exercise is a great option. SEEK MEDICAL CARE IF:  You have pain that is not relieved with rest or medicine.  You have pain that does not improve in 1 week.  You have new symptoms.  You are generally not feeling well. SEEK IMMEDIATE MEDICAL CARE IF:   You have pain that radiates from your back into your legs.  You develop new bowel or bladder control problems.  You have unusual weakness or numbness in your arms or legs.  You develop nausea or vomiting.  You develop abdominal pain.  You feel faint. Document Released: 07/27/2005 Document Revised:  01/26/2012 Document Reviewed: 11/28/2013 [facility] Patient Information 2015 [place], [place]. This information is not intended to replace advice given to you by your health care provider. Make sure you discuss any questions you have with your health care provider.

## 2014-11-12 NOTE — ED Provider Notes (Signed)
CSN: [id number]     Arrival date & time 11/12/14  0708 History   First MD Initiated Contact with Patient 11/12/14 [phone]     Chief Complaint  Patient presents with  . Chest Pain   Rebecca Allen is a 49 y.o. female with a history of GERD and hyperlipidemia who presents to the ED complaining of substernal chest pain ongoing for the past 2 weeks associated with fatigue. She reports associated shortness of breath. She reports her pain has worsened recently and rates her pain at 10/10 and describes it as sharp. Her pain radiates to her left shoulder blade. Her chest pain and shortness of breath are not worse on exertion. She reports her pain has been constant but fluctuates in intensity over the past 2 weeks. Patient was reports intermittent palpitations or fluttering, but none currently. Patient also reports a slight cough for the past 2 days. Patient reports taking 325 mg aspirin today. She reports having a clear cardiac stress test 1 year ago. She reports a family history of a father with MI at age 61. She also reports some pain in her left lateral neck and tingling into her left arm intermittently. She denies injury or numbness. She is a smoker and is on estrogen patches. The patient denies fevers, chills, abdominal pain, nausea, vomiting, leg swelling, rashes, numbness, weakness. She denies personal or family history of PEs or DVTs. Patient denies personal or family history of blood clotting disorders such as factor V Leiden, protein C or S deficiency. The patient denies recent long travel, history of cancer or recent surgery.  (Consider location/radiation/quality/duration/timing/severity/associated sxs/prior Treatment) HPI  Past Medical History  Diagnosis Date  . Treadmill stress test negative for angina pectoris 12/18/2010  . Depression   . GERD (gastroesophageal reflux disease)   . Hyperlipidemia   . COPD (chronic obstructive pulmonary disease)    Past Surgical History  Procedure  Laterality Date  . Partial hysterectomy  08-1999    abdominal   Family History  Problem Relation Age of Onset  . COPD Mother   . Heart attack Father   . Hypertension Father   . Leukemia Brother   . Kidney failure Maternal Grandfather   . COPD Paternal Grandmother    History  Substance Use Topics  . Smoking status: Current Every Day Smoker -- 0.30 packs/day for 30 years    Types: Cigarettes  . Smokeless tobacco: Never Used  . Alcohol Use: 0.0 oz/week    0 Standard drinks or equivalent per week     Comment: beer ocassionally   OB History    No data available     Review of Systems  Constitutional: Positive for fatigue. Negative for fever and chills.  HENT: Negative for congestion, ear pain, sore throat and trouble swallowing.   Eyes: Negative for visual disturbance.  Respiratory: Positive for cough and shortness of breath. Negative for chest tightness and wheezing.   Cardiovascular: Positive for chest pain and palpitations. Negative for leg swelling.  Gastrointestinal: Negative for nausea, vomiting, abdominal pain and diarrhea.  Genitourinary: Negative for dysuria, hematuria and difficulty urinating.  Musculoskeletal: Positive for neck pain. Negative for back pain and neck stiffness.  Skin: Negative for rash.  Neurological: Negative for weakness, numbness and headaches.      Allergies  Metoclopramide hcl and Penicillins  Home Medications   Prior to Admission medications   Medication Sig Start Date End Date Taking? Authorizing Provider  aspirin 81 MG tablet Take 81 mg by mouth daily.  Yes Historical Provider, MD  cyclobenzaprine (FLEXERIL) 5 MG tablet Take 0.5-1 tablets (2.5-5 mg total) by mouth 3 (three) times daily as needed for muscle spasms. Patient not taking: Reported on 11/12/2014 08/17/14   Tonia Ghent, MD  estradiol (VIVELLE-DOT) 0.05 MG/24HR patch Place 1 patch onto the skin 2 (two) times a week. Tuesday and Friday 10/03/14   Historical Provider, MD  ibuprofen  (ADVIL) 200 MG tablet Take 2 tablets (400 mg total) by mouth every 6 (six) hours as needed for moderate pain (with food). Patient not taking: Reported on 11/12/2014 08/17/14   Tonia Ghent, MD  methocarbamol (ROBAXIN) 500 MG tablet Take 1 tablet (500 mg total) by mouth 2 (two) times daily as needed for muscle spasms. 11/12/14   Waynetta Pean, PA-C  omeprazole (PRILOSEC) 20 MG capsule Take 1 capsule (20 mg total) by mouth daily. 11/12/14   Waynetta Pean, PA-C  sertraline (ZOLOFT) 25 MG tablet Take 1 tablet (25 mg total) by mouth daily. Patient not taking: Reported on 11/12/2014 10/05/14   Jinny Sanders, MD   BP 91/39 mmHg  Pulse 68  Temp(Src) 98 F (36.7 C) (Oral)  Resp 16  SpO2 99% Physical Exam  Constitutional: She appears well-developed and well-nourished. No distress.  HENT:  Head: Normocephalic and atraumatic.  Mouth/Throat: Oropharynx is clear and moist. No oropharyngeal exudate.  Eyes: Conjunctivae are normal. Pupils are equal, round, and reactive to light. Right eye exhibits no discharge. Left eye exhibits no discharge.  Neck: Normal range of motion. Neck supple. No JVD present. No tracheal deviation present.  Left lateral neck is tender to palpation over her trapezius muscle. No midline neck tenderness.   Cardiovascular: Normal rate, regular rhythm, normal heart sounds and intact distal pulses.  Exam reveals no gallop and no friction rub.   No murmur heard. Bilateral radial, posterior tibialis and dorsalis pedis pulses are intact.   Pulmonary/Chest: Effort normal and breath sounds normal. No respiratory distress. She has no wheezes. She has no rales. She exhibits no tenderness.  Chest is non-tender to palpation.   Abdominal: Soft. Bowel sounds are normal. She exhibits no distension and no mass. There is no tenderness. There is no rebound and no guarding.  Musculoskeletal: She exhibits no edema.  No back midline tenderness. Patient is spontaneously moving all extremities in a coordinated  fashion exhibiting good strength. No lower extremity edema or tenderness. No left shoulder bony point tenderness, deformity or edema. Normal ROM of her left shoulder.   Lymphadenopathy:    She has no cervical adenopathy.  Neurological: She is alert. Coordination normal.  Sensation is intact in her bilateral upper and lower extremities. Good and equal grip strengths bilaterally.   Skin: Skin is warm and dry. No rash noted. She is not diaphoretic. No erythema. No pallor.  Psychiatric: She has a normal mood and affect. Her behavior is normal.  Nursing note and vitals reviewed.   ED Course  Procedures (including critical care time) Labs Review Labs Reviewed  BASIC METABOLIC PANEL - Abnormal; Notable for the following:    GFR calc non Af Amer 79 (*)    All other components within normal limits  HEPATIC FUNCTION PANEL - Abnormal; Notable for the following:    ALT 50 (*)    All other components within normal limits  CBC WITH DIFFERENTIAL/PLATELET  D-DIMER, QUANTITATIVE  LIPASE, BLOOD  I-STAT TROPOININ, ED    Imaging Review Dg Chest 2 View  11/12/2014   CLINICAL DATA:  Chest pain with  intermittent difficulty breathing  EXAM: CHEST  2 VIEW  COMPARISON:  May 23, 2014  FINDINGS: There is minimal atelectatic change in the left base. Lungs are otherwise clear. Heart size and pulmonary vascularity are normal. No pneumothorax. No adenopathy. No bone lesions.  IMPRESSION: Rather minimal left base atelectasis.  Lungs elsewhere clear.   Electronically Signed   By: Lowella Grip III M.D.   On: 11/12/2014 07:55     EKG Interpretation   Date/Time:  Monday November 12 2014 07:14:34 EDT Ventricular Rate:  69 PR Interval:  118 QRS Duration: 80 QT Interval:  402 QTC Calculation: 430 R Axis:   40 Text Interpretation:  Normal sinus rhythm Normal ECG No significant change  since last tracing Confirmed by STEINL  MD, KEVIN ([id number]) on 11/12/2014  7:23:44 AM      Filed Vitals:   11/12/14 1045  11/12/14 1100 11/12/14 1115 11/12/14 1147  BP: 99/56 95/51 91/39    Pulse: 79 70 68   Temp:    98 F (36.7 C)  TempSrc:    Oral  Resp: 24 19 16    SpO2: 98% 97% 99%      MDM   Meds given in ED:  Medications  acetaminophen (TYLENOL) tablet 650 mg (not administered)  morphine 4 MG/ML injection 4 mg (4 mg Intravenous Given 11/12/14 0850)  ondansetron (ZOFRAN) injection 4 mg (4 mg Intravenous Given 11/12/14 0850)  LORazepam (ATIVAN) injection 1 mg (1 mg Intravenous Given 11/12/14 0938)  gi cocktail (Maalox,Lidocaine,Donnatal) (30 mLs Oral Given 11/12/14 1008)    New Prescriptions   METHOCARBAMOL (ROBAXIN) 500 MG TABLET    Take 1 tablet (500 mg total) by mouth 2 (two) times daily as needed for muscle spasms.   OMEPRAZOLE (PRILOSEC) 20 MG CAPSULE    Take 1 capsule (20 mg total) by mouth daily.    Final diagnoses:  Atypical chest pain  Mid back pain on left side  Gastroesophageal reflux disease, esophagitis presence not specified   This is a 49 y.o. female with a history of GERD and hyperlipidemia who presents to the ED complaining of substernal chest pain ongoing for the past 2 weeks associated with fatigue. She reports associated shortness of breath. She reports her pain has worsened recently and rates her pain at 10/10 and describes it as sharp. Her pain radiates to her left shoulder blade. Her chest pain and shortness of breath are not worse on exertion.  Troponin is negative. BMP is unremarkable. CBC is within normal limits. D-dimer is negative. Chest x-ray is unremarkable. At 09:45 patient is complaining of worsening pain that is also in her right upper abdomen and substernal chest. Will check LFTs and lipase. Patient had her gallbladder removed previously. Will not do ultrasound at this time. She reports taking tums that she had in her purse that helped somewhat. Will try GI cocktail.  Lipase is within normal limits and hepatic function panel shows an ALT of 50 but is otherwise  unremarkable. After GI coctail and ativan the patient reports feeling much better. She still complains of back pain. This seems to be musculoskeletal, as she is tender over her trapezius muscle. We'll give Tylenol prior to discharge. Patient is to be discharged with recommendation to follow up with PCP in regards to today's hospital visit. Chest pain is not likely of cardiac or pulmonary etiology d/t presentation, d-dimer negative, VSS, no tracheal deviation, no JVD or new murmur, RRR, breath sounds equal bilaterally, EKG without acute abnormalities, negative troponin, and negative CXR.  Pt has been advised start a PPI and return to the ED if CP becomes exertional, associated with diaphoresis or nausea, radiates to left jaw/arm, worsens or becomes concerning in any way. Pt appears reliable for follow up and is agreeable to discharge. Will also give robaxin for muscle spasm. I advised the patient to follow-up with their primary care provider this week. I advised the patient to return to the emergency department with new or worsening symptoms or new concerns. The patient verbalized understanding and agreement with plan.   This patient was discussed with and evaluated by Dr. Ashok Cordia who agrees with assessment and plan.   Waynetta Pean, PA-C 11/12/14 [place], MD 11/12/14 [phone]

## 2014-11-12 NOTE — ED Notes (Signed)
Pt c/o severe pain in upper abdomen/substernal region. PA Will aware and is at bedside.

## 2014-11-12 NOTE — ED Notes (Signed)
Pt brought back to room; pt getting undressed and into a gown; Erlene Quan, EMT aware of pt

## 2014-11-12 NOTE — ED Notes (Signed)
Pt placed in gown and in bed. Pt monitored by pulse ox, bp cuff, and 5-lead. 

## 2014-11-12 NOTE — ED Notes (Signed)
Pt reports having ongoing chest pain X2 weeks with pain radiating to back. Reports family hx of heart disease and other issues. Denies cardiac hx and had stress test less than a year ago that was normal.

## 2014-11-20 ENCOUNTER — Ambulatory Visit (INDEPENDENT_AMBULATORY_CARE_PROVIDER_SITE_OTHER): Payer: 59 | Admitting: Family Medicine

## 2014-11-20 ENCOUNTER — Ambulatory Visit (INDEPENDENT_AMBULATORY_CARE_PROVIDER_SITE_OTHER)
Admission: RE | Admit: 2014-11-20 | Discharge: 2014-11-20 | Disposition: A | Discharge status: Home / Self Care | Payer: 59 | Source: Ambulatory Visit | Attending: Family Medicine | Admitting: Family Medicine

## 2014-11-20 ENCOUNTER — Encounter: Payer: Self-pay | Admitting: Family Medicine

## 2014-11-20 VITALS — BP 94/62 | HR 73 | Temp 98.3°F | Ht 65.5 in | Wt 167.5 lb

## 2014-11-20 DIAGNOSIS — E785 Hyperlipidemia, unspecified: Secondary | ICD-10-CM | POA: Diagnosis not present

## 2014-11-20 DIAGNOSIS — R0789 Other chest pain: Secondary | ICD-10-CM | POA: Diagnosis not present

## 2014-11-20 DIAGNOSIS — M542 Cervicalgia: Secondary | ICD-10-CM | POA: Diagnosis not present

## 2014-11-20 DIAGNOSIS — M25512 Pain in left shoulder: Secondary | ICD-10-CM

## 2014-11-20 LAB — HEPATIC FUNCTION PANEL
ALBUMIN: 4.5 g/dL (ref 3.5–5.2)
ALT: 41 U/L — ABNORMAL HIGH (ref 0–35)
AST: 20 U/L (ref 0–37)
Alkaline Phosphatase: 59 U/L (ref 39–117)
BILIRUBIN DIRECT: 0.1 mg/dL (ref 0.0–0.3)
TOTAL PROTEIN: 7.4 g/dL (ref 6.0–8.3)
Total Bilirubin: 0.3 mg/dL (ref 0.2–1.2)

## 2014-11-20 LAB — LIPID PANEL
Cholesterol: 233 mg/dL — ABNORMAL HIGH (ref 0–200)
HDL: 62.4 mg/dL (ref >39.00)
LDL CALC: 141 mg/dL — AB (ref 0–99)
NONHDL: 170.6
Total CHOL/HDL Ratio: 4
Triglycerides: 149 mg/dL (ref 0.0–149.0)
VLDL: 29.8 mg/dL (ref 0.0–40.0)

## 2014-11-20 NOTE — Patient Instructions (Addendum)
If chest pain not continuing to improve.. Can increase prilosec to 40 mg daily. Avoid triggers. Return when able for fasting labs for chol and to recheck liver function. Stop at X-ray on way out, we will call with results.

## 2014-11-20 NOTE — Progress Notes (Signed)
Subjective:    Patient ID: LEAHANNA BUSER, female    DOB: [birth year], 49 y.o.   MRN: [id number]  HPI  49 year old female with history of atypical chest pain, GAD, panic disorder, GERD presents following ER visit for chest pain ands left shoulder pain on 4/4 Reviewed ER visit in detail as well as studies. BMP, cbc, troponin, CXR, lipase, EKG, ddimer: nml. Only abnormal was ALT 50.  Started on PPI ( prilosec) and given robaxin.  Chest pain was felt to be noncardiac.. More like GI or MSK in origin.  Today pt reports:  She has made lifestyle changes and prilosec 20 mg daily.Marland Kitchen Has helped a lot. Still with chest pressure, not pain.   HX MI in father.   Pt with history of atypical chest pain. ECHO stress test in 2012 per Dr. Saralyn Pilar low risk, nml EF.  New transaminemia:  No new meds other than as at ED.  N tylenol use, minimal ETOH use.  Continued chronic left shoulder pain and left neck pain.  Ongoing x 6 months:  Grinding with movement. Worse with picking up kids.  prior to pain she fell and hit let shoulder 6 months ago. Occ tingling in left hand.  No weakness, not dropping things. She is left handed.   Not using ibuprofen as irritates stomach, using heat and essential oils.  Occ using flexeril.. Minimal benefit but causes seddation. Went to PT 05/2014, helped minimally.  Social History /Family Hhistory/Past Medical History reviewed and updated if needed.  Review of Systems  Constitutional: Negative for fever and fatigue.  HENT: Negative for ear pain.   Eyes: Negative for pain.  Respiratory: Negative for chest tightness and shortness of breath.   Cardiovascular: Negative for chest pain, palpitations and leg swelling.  Gastrointestinal: Negative for abdominal pain.  Genitourinary: Negative for dysuria.  All other systems reviewed and are negative.      Objective:   Physical Exam  Constitutional: Vital signs are normal. She appears well-developed and well-nourished.  She is cooperative.  Non-toxic appearance. She does not appear ill. No distress.  HENT:  Head: Normocephalic.  Right Ear: Hearing, tympanic membrane, external ear and ear canal normal. Tympanic membrane is not erythematous, not retracted and not bulging.  Left Ear: Hearing, tympanic membrane, external ear and ear canal normal. Tympanic membrane is not erythematous, not retracted and not bulging.  Nose: No mucosal edema or rhinorrhea. Right sinus exhibits no maxillary sinus tenderness and no frontal sinus tenderness. Left sinus exhibits no maxillary sinus tenderness and no frontal sinus tenderness.  Mouth/Throat: Uvula is midline, oropharynx is clear and moist and mucous membranes are normal.  Eyes: Conjunctivae, EOM and lids are normal. Pupils are equal, round, and reactive to light. Lids are everted and swept, no foreign bodies found.  Neck: Trachea normal and normal range of motion. Neck supple. Carotid bruit is not present. No thyroid mass and no thyromegaly present.  Cardiovascular: Normal rate, regular rhythm, S1 normal, S2 normal, normal heart sounds, intact distal pulses and normal pulses.  Exam reveals no gallop and no friction rub.   No murmur heard. Pulmonary/Chest: Effort normal and breath sounds normal. No tachypnea. No respiratory distress. She has no decreased breath sounds. She has no wheezes. She has no rhonchi. She has no rales.  Abdominal: Soft. Normal appearance and bowel sounds are normal. There is no tenderness.  Musculoskeletal:       Left shoulder: She exhibits decreased range of motion, tenderness and spasm. She exhibits  no bony tenderness, no swelling, no effusion and no deformity.       Cervical back: She exhibits tenderness. She exhibits normal range of motion and no bony tenderness.  Neg spurling's  Neurological: She is alert.  Skin: Skin is warm, dry and intact. No rash noted.  Psychiatric: Her speech is normal and behavior is normal. Judgment and thought content  normal. Her mood appears not anxious. Cognition and memory are normal. She does not exhibit a depressed mood.          Assessment & Plan:

## 2014-11-20 NOTE — Progress Notes (Signed)
Pre visit review using our clinic review tool, if applicable. No additional management support is needed unless otherwise documented below in the visit note. 

## 2014-11-26 ENCOUNTER — Encounter: Payer: Self-pay | Admitting: *Deleted

## 2014-12-18 DIAGNOSIS — M542 Cervicalgia: Secondary | ICD-10-CM | POA: Insufficient documentation

## 2014-12-18 DIAGNOSIS — M25512 Pain in left shoulder: Secondary | ICD-10-CM | POA: Insufficient documentation

## 2014-12-18 NOTE — Assessment & Plan Note (Signed)
Due for re-eval. Encouraged exercise, weight loss, healthy eating habits.

## 2014-12-18 NOTE — Assessment & Plan Note (Signed)
Likely due to GERD. If chest pain not continuing to improve.. Can increase prilosec to 40 mg daily. Avoid triggers.

## 2014-12-18 NOTE — Assessment & Plan Note (Signed)
Eval with X-ray given ongoing for 25months,

## 2014-12-18 NOTE — Assessment & Plan Note (Addendum)
Eval with X-ray given ongoing x 6l months.  Start home stretching.

## 2015-01-31 ENCOUNTER — Ambulatory Visit: Payer: Self-pay | Admitting: Family Medicine

## 2015-01-31 ENCOUNTER — Ambulatory Visit (INDEPENDENT_AMBULATORY_CARE_PROVIDER_SITE_OTHER): Payer: 59 | Admitting: Primary Care

## 2015-01-31 ENCOUNTER — Ambulatory Visit (INDEPENDENT_AMBULATORY_CARE_PROVIDER_SITE_OTHER)
Admission: RE | Admit: 2015-01-31 | Discharge: 2015-01-31 | Disposition: A | Discharge status: Home / Self Care | Payer: 59 | Source: Ambulatory Visit | Attending: Primary Care | Admitting: Primary Care

## 2015-01-31 ENCOUNTER — Encounter: Payer: Self-pay | Admitting: Primary Care

## 2015-01-31 VITALS — BP 96/72 | HR 70 | Temp 98.0°F | Ht 65.5 in | Wt 164.8 lb

## 2015-01-31 DIAGNOSIS — R059 Cough, unspecified: Secondary | ICD-10-CM

## 2015-01-31 DIAGNOSIS — R05 Cough: Secondary | ICD-10-CM

## 2015-01-31 MED ORDER — HYDROCODONE-HOMATROPINE 5-1.5 MG/5ML PO SYRP: 5.0000 mL | ORAL_SOLUTION | Freq: Every evening | ORAL | Status: DC | PRN

## 2015-01-31 NOTE — Patient Instructions (Signed)
Complete xray(s) prior to leaving today. I will contact you regarding your results.  You may take Robitussin or Delsym over the counter for day time cough. You may take the Hycodan at night for cough and rest.  Continue allegra and flonase.  I will be in contact with you later today. It was nice meeting you!

## 2015-01-31 NOTE — Progress Notes (Signed)
Subjective:    Patient ID: Rebecca Allen, female    DOB: 12-Dec-1965, 49 y.o.   MRN: [id number]  HPI  [name] is a 49 year old female who presents today with a chief complaint of cough. Her cough has been present since Sunday this week. She also reports nasal congestion and wheezing with shortness breath. She's tried taking robaxin (as she thought she may have pulled a muscle dancing at a wedding this past weekend), moist heating pads to her back, ibuprofen, flonase, allegra. None of these remedies have helped to improve her cough and congestion. Her chest congestion is the worst symptom.   Review of Systems  Constitutional: Negative for fever and chills.  HENT: Positive for congestion. Negative for ear pain, sinus pressure and sore throat.   Respiratory: Positive for cough, shortness of breath and wheezing.   Musculoskeletal: Positive for myalgias.  Neurological: Positive for headaches.       Past Medical History  Diagnosis Date  . Treadmill stress test negative for angina pectoris 12/18/2010  . Depression   . GERD (gastroesophageal reflux disease)   . Hyperlipidemia   . COPD (chronic obstructive pulmonary disease)     History   Social History  . Marital Status: Married    Spouse Name: N/A  . Number of Children: N/A  . Years of Education: N/A   Occupational History  . Certified Medical Assistant     [facility] in [place] Topics  . Smoking status: Current Every Day Smoker -- 0.30 packs/day for 30 years    Types: Cigarettes  . Smokeless tobacco: Never Used  . Alcohol Use: 0.0 oz/week    0 Standard drinks or equivalent per week     Comment: beer ocassionally  . Drug Use: No  . Sexual Activity: Not on file   Other Topics Concern  . Not on file   Social History Narrative   Regular exercise- yes, occ walking   Diet- fruits and veggies, water     Past Surgical History  Procedure Laterality Date  . Partial hysterectomy  08-1999    abdominal    Family History  Problem Relation Age of Onset  . COPD Mother   . Heart attack Father   . Hypertension Father   . Leukemia Brother   . Kidney failure Maternal Grandfather   . COPD Paternal Grandmother     Allergies  Allergen Reactions  . Metoclopramide Hcl     REACTION: Nervousness  . Penicillins     REACTION: Upsets stomach    Current Outpatient Prescriptions on File Prior to Visit  Medication Sig Dispense Refill  . aspirin 81 MG tablet Take 81 mg by mouth daily.    Marland Kitchen estradiol (VIVELLE-DOT) 0.05 MG/24HR patch Place 1 patch onto the skin 2 (two) times a week. Tuesday and Friday    . ibuprofen (ADVIL) 200 MG tablet Take 2 tablets (400 mg total) by mouth every 6 (six) hours as needed for moderate pain (with food).    . methocarbamol (ROBAXIN) 500 MG tablet Take 1 tablet (500 mg total) by mouth 2 (two) times daily as needed for muscle spasms. 20 tablet 0  . omeprazole (PRILOSEC) 20 MG capsule Take 1 capsule (20 mg total) by mouth daily. 30 capsule 0   No current facility-administered medications on file prior to visit.    BP 96/72 mmHg  Pulse 70  Temp(Src) 98 F (36.7 C) (Oral)  Ht 5' 5.5" (1.664 m)  [facility]  164 lb 12.8 oz (74.753 kg)  BMI 27.00 kg/m2  SpO2 98%    Objective:   Physical Exam  Constitutional: She appears well-nourished. She appears ill.  HENT:  Right Ear: Tympanic membrane and ear canal normal.  Left Ear: Tympanic membrane and ear canal normal.  Nose: Nose normal. Right sinus exhibits no maxillary sinus tenderness and no frontal sinus tenderness. Left sinus exhibits no maxillary sinus tenderness and no frontal sinus tenderness.  Mouth/Throat: Oropharynx is clear and moist.  Eyes: Conjunctivae are normal. Pupils are equal, round, and reactive to light.  Cardiovascular: Normal rate and regular rhythm.   Pulmonary/Chest: Effort normal. She has no wheezes.  No wheezing, crackles noted to right lower lobe.  Skin: Skin is warm and dry.           Assessment & Plan:  Cough:  Present for 4 days with chest congestion and shortness of breath.  No fevers, chills, n/v. Crackles noted to right lower lobe, no wheezing.  Chest Xray today to rule out pneumonia. Supportive measures for treatment. Xray pending. Will treat if positive.

## 2015-07-15 ENCOUNTER — Ambulatory Visit (INDEPENDENT_AMBULATORY_CARE_PROVIDER_SITE_OTHER): Payer: 59 | Admitting: Family Medicine

## 2015-07-15 ENCOUNTER — Encounter: Payer: Self-pay | Admitting: Family Medicine

## 2015-07-15 VITALS — BP 90/70 | HR 68 | Temp 97.7°F | Ht 65.5 in | Wt 170.8 lb

## 2015-07-15 DIAGNOSIS — M549 Dorsalgia, unspecified: Secondary | ICD-10-CM | POA: Diagnosis not present

## 2015-07-15 DIAGNOSIS — B9789 Other viral agents as the cause of diseases classified elsewhere: Secondary | ICD-10-CM

## 2015-07-15 DIAGNOSIS — J069 Acute upper respiratory infection, unspecified: Secondary | ICD-10-CM | POA: Diagnosis not present

## 2015-07-15 DIAGNOSIS — M546 Pain in thoracic spine: Secondary | ICD-10-CM | POA: Insufficient documentation

## 2015-07-15 MED ORDER — TRAMADOL HCL 50 MG PO TABS: 50.0000 mg | ORAL_TABLET | Freq: Three times a day (TID) | ORAL | Status: DC | PRN

## 2015-07-15 MED ORDER — OMEPRAZOLE 20 MG PO CPDR: 20.0000 mg | DELAYED_RELEASE_CAPSULE | Freq: Every day | ORAL | Status: DC

## 2015-07-15 MED ORDER — CYCLOBENZAPRINE HCL 5 MG PO TABS: 5.0000 mg | ORAL_TABLET | Freq: Every evening | ORAL | Status: DC | PRN

## 2015-07-15 NOTE — Assessment & Plan Note (Signed)
Likely MSK strain from holding grandchild at [employer] and pick up often.  Treat with  Muscle relaxant, tramadol for pain. Start home PT, massage and heat.

## 2015-07-15 NOTE — Patient Instructions (Addendum)
Heat on mid back. Massage. No heavy lifting. Mucinex DM twice daily for cough. Muscle relaxant at night. Can use tramadol as needed for pain.  Start home stretching per instructions. Call if not improving in 2 weeks.

## 2015-07-15 NOTE — Progress Notes (Signed)
Subjective:    Patient ID: Rebecca Allen, female    DOB: 1966/08/04, 49 y.o.   MRN: [id number]  HPI  49 year old female with history of chronic low back and chronic chest pain, anxiety presents with acute onset of mid back pain.  Thoracic back pain, sudden onset.  Throbbing. Worse with leaning over, moving.  Feels like pain radiates to front of chest.  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 No numbness, no weakness, some tingling in left hand and right pinky toe.. Not new.    She reports  mild cough, productive x 4 days.  Mild shortness of breath. She has not had a cigarette in 2 days.  She has tried ibuprofen [phone] mg .." tearing her stomach up". Stomach pain.  Using heat, cold packs.  Social History /Family History/Past Medical History reviewed and updated if needed. Tobacco abuse.    Review of Systems  Constitutional: Negative for fever and fatigue.  HENT: Negative for ear pain.   Eyes: Negative for pain.  Respiratory: Negative for chest tightness and shortness of breath.   Cardiovascular: Positive for chest pain. Negative for palpitations and leg swelling.  Gastrointestinal: Negative for abdominal pain.  Genitourinary: Negative for dysuria.       Objective:   Physical Exam  Constitutional: Vital signs are normal. She appears well-developed and well-nourished. She is cooperative.  Non-toxic appearance. She does not appear ill. No distress.  HENT:  Head: Normocephalic.  Right Ear: Hearing, tympanic membrane, external ear and ear canal normal. Tympanic membrane is not erythematous, not retracted and not bulging.  Left Ear: Hearing, tympanic membrane, external ear and ear canal normal. Tympanic membrane is not erythematous, not retracted and not bulging.  Nose: Rhinorrhea present. No mucosal edema. Right sinus exhibits no maxillary sinus tenderness and no frontal sinus tenderness. Left sinus exhibits no maxillary sinus tenderness and no  frontal sinus tenderness.  Mouth/Throat: Uvula is midline, oropharynx is clear and moist and mucous membranes are normal.  Eyes: Conjunctivae, EOM and lids are normal. Pupils are equal, round, and reactive to light. Lids are everted and swept, no foreign bodies found.  Neck: Trachea normal and normal range of motion. Neck supple. Carotid bruit is not present. No thyroid mass and no thyromegaly present.  Cardiovascular: Normal rate, regular rhythm, S1 normal, S2 normal, normal heart sounds, intact distal pulses and normal pulses.  Exam reveals no gallop and no friction rub.   No murmur heard. Pulmonary/Chest: Effort normal and breath sounds normal. No tachypnea. No respiratory distress. She has no decreased breath sounds. She has no wheezes. She has no rhonchi. She has no rales.  Abdominal: Soft. Normal appearance and bowel sounds are normal. There is no tenderness.  Musculoskeletal:       Cervical back: She exhibits decreased range of motion and tenderness. She exhibits no bony tenderness.       Thoracic back: She exhibits decreased range of motion and tenderness. She exhibits no bony tenderness.  Pain in neck is chronic   muscle spasm greatest on left thoracic back over paraspinous muscles  Neurological: She is alert. She has normal strength. No cranial nerve deficit or sensory deficit. Gait normal.  Skin: Skin is warm, dry and intact. No rash noted.  Psychiatric: Her speech is normal and behavior is normal. Judgment and thought content normal. Her mood appears not anxious. Cognition and memory are normal. She does not exhibit a depressed mood.  Assessment & Plan:

## 2015-07-15 NOTE — Assessment & Plan Note (Signed)
Symptomatic care. Nml lung exam.

## 2015-08-07 ENCOUNTER — Emergency Department: Payer: 59

## 2015-08-07 ENCOUNTER — Emergency Department
Admission: EM | Admit: 2015-08-07 | Discharge: 2015-08-07 | Disposition: A | Discharge status: Home / Self Care | Payer: 59 | Attending: Emergency Medicine | Admitting: Emergency Medicine

## 2015-08-07 ENCOUNTER — Encounter: Payer: Self-pay | Admitting: Emergency Medicine

## 2015-08-07 DIAGNOSIS — R202 Paresthesia of skin: Secondary | ICD-10-CM

## 2015-08-07 DIAGNOSIS — Z7982 Long term (current) use of aspirin: Secondary | ICD-10-CM | POA: Insufficient documentation

## 2015-08-07 DIAGNOSIS — F17210 Nicotine dependence, cigarettes, uncomplicated: Secondary | ICD-10-CM | POA: Diagnosis not present

## 2015-08-07 DIAGNOSIS — Z79890 Hormone replacement therapy (postmenopausal): Secondary | ICD-10-CM | POA: Diagnosis not present

## 2015-08-07 DIAGNOSIS — H538 Other visual disturbances: Secondary | ICD-10-CM | POA: Diagnosis not present

## 2015-08-07 DIAGNOSIS — R51 Headache: Secondary | ICD-10-CM | POA: Diagnosis not present

## 2015-08-07 DIAGNOSIS — Z880 Allergy status to penicillin: Secondary | ICD-10-CM | POA: Insufficient documentation

## 2015-08-07 DIAGNOSIS — R42 Dizziness and giddiness: Secondary | ICD-10-CM | POA: Diagnosis not present

## 2015-08-07 DIAGNOSIS — R079 Chest pain, unspecified: Secondary | ICD-10-CM | POA: Insufficient documentation

## 2015-08-07 DIAGNOSIS — Z79899 Other long term (current) drug therapy: Secondary | ICD-10-CM | POA: Insufficient documentation

## 2015-08-07 LAB — BASIC METABOLIC PANEL
ANION GAP: 7 (ref 5–15)
BUN: 19 mg/dL (ref 6–20)
CHLORIDE: 103 mmol/L (ref 101–111)
CO2: 30 mmol/L (ref 22–32)
Calcium: 9.5 mg/dL (ref 8.9–10.3)
Creatinine, Ser: 0.77 mg/dL (ref 0.44–1.00)
GFR calc Af Amer: >60 mL/min (ref >60)
GFR calc non Af Amer: >60 mL/min (ref >60)
GLUCOSE: 88 mg/dL (ref 65–99)
Potassium: 3.8 mmol/L (ref 3.5–5.1)
SODIUM: 140 mmol/L (ref 135–145)

## 2015-08-07 LAB — CBC
HEMATOCRIT: 42.3 % (ref 35.0–47.0)
HEMOGLOBIN: 14 g/dL (ref 12.0–16.0)
MCH: 30.1 pg (ref 26.0–34.0)
MCHC: 33.1 g/dL (ref 32.0–36.0)
MCV: 90.8 fL (ref 80.0–100.0)
Platelets: 247 10*3/uL (ref 150–440)
RBC: 4.66 MIL/uL (ref 3.80–5.20)
RDW: 12.8 % (ref 11.5–14.5)
WBC: 6.9 10*3/uL (ref 3.6–11.0)

## 2015-08-07 LAB — TROPONIN I

## 2015-08-07 MED ORDER — ONDANSETRON HCL 4 MG/2ML IJ SOLN
4.0000 mg | Freq: Once | INTRAMUSCULAR | Status: AC
  Administered 2015-08-07: 4 mg via INTRAVENOUS
  Filled 2015-08-07: qty 2

## 2015-08-07 MED ORDER — OXYCODONE-ACETAMINOPHEN 5-325 MG PO TABS: 1.0000 | ORAL_TABLET | Freq: Four times a day (QID) | ORAL | Status: DC | PRN

## 2015-08-07 MED ORDER — MORPHINE SULFATE (PF) 4 MG/ML IV SOLN
4.0000 mg | Freq: Once | INTRAVENOUS | Status: AC
  Administered 2015-08-07: 4 mg via INTRAVENOUS
  Filled 2015-08-07: qty 1

## 2015-08-07 MED ORDER — SODIUM CHLORIDE 0.9 % IV BOLUS (SEPSIS)
1000.0000 mL | Freq: Once | INTRAVENOUS | Status: AC
  Administered 2015-08-07: 1000 mL via INTRAVENOUS

## 2015-08-07 MED ORDER — GI COCKTAIL ~~LOC~~
30.0000 mL | Freq: Once | ORAL | Status: AC
  Administered 2015-08-07: 30 mL via ORAL
  Filled 2015-08-07: qty 30

## 2015-08-07 NOTE — ED Notes (Signed)
Pt hooked up to cardiac monitor by this tech, pt given call light for assistance if needed.

## 2015-08-07 NOTE — ED Notes (Signed)
edp notified of pt c/o burning sensation to abd after gi cocktail was given. Pt given ice chips.

## 2015-08-07 NOTE — ED Notes (Signed)
Patient transported to X-ray 

## 2015-08-07 NOTE — ED Notes (Signed)
edp in to see at this time.

## 2015-08-07 NOTE — ED Notes (Signed)
Pt ambulatory to triage with reports of left sided stabbing pain to chest that began 2 days ago. This am pt began having left arm numbness/tingling around 0530 with blurred vision to left eye, pt also reports that she has been having headache and dizziness. Grip strengths equal, clear speech.

## 2015-08-07 NOTE — Discharge Instructions (Signed)
Nonspecific Chest Pain °It is often hard to find the cause of chest pain. There is always a chance that your pain could be related to something serious, such as a heart attack or a blood clot in your lungs. Chest pain can also be caused by conditions that are not life-threatening. If you have chest pain, it is very important to follow up with your doctor. ° °HOME CARE °· If you were prescribed an antibiotic medicine, finish it all even if you start to feel better. °· Avoid any activities that cause chest pain. °· Do not use any tobacco products, including cigarettes, chewing tobacco, or electronic cigarettes. If you need help quitting, ask your doctor. °· Do not drink alcohol. °· Take medicines only as told by your doctor. °· Keep all follow-up visits as told by your doctor. This is important. This includes any further testing if your chest pain does not go away. °· Your doctor may tell you to keep your head raised (elevated) while you sleep. °· Make lifestyle changes as told by your doctor. These may include: °· Getting regular exercise. Ask your doctor to suggest some activities that are safe for you. °· Eating a heart-healthy diet. Your doctor or a diet specialist (dietitian) can help you to learn healthy eating options. °· Maintaining a healthy weight. °· Managing diabetes, if necessary. °· Reducing stress. °GET HELP IF: °· Your chest pain does not go away, even after treatment. °· You have a rash with blisters on your chest. °· You have a fever. °GET HELP RIGHT AWAY IF: °· Your chest pain is worse. °· You have an increasing cough, or you cough up blood. °· You have severe belly (abdominal) pain. °· You feel extremely weak. °· You pass out (faint). °· You have chills. °· You have sudden, unexplained chest discomfort. °· You have sudden, unexplained discomfort in your arms, back, neck, or jaw. °· You have shortness of breath at any time. °· You suddenly start to sweat, or your skin gets clammy. °· You feel  nauseous. °· You vomit. °· You suddenly feel light-headed or dizzy. °· Your heart begins to beat quickly, or it feels like it is skipping beats. °These symptoms may be an emergency. Do not wait to see if the symptoms will go away. Get medical help right away. Call your local emergency services (911 in the [place] Do not drive yourself to the hospital. °  °This information is not intended to replace advice given to you by your health care provider. Make sure you discuss any questions you have with your health care provider. °  °Document Released: 01/13/2008 Document Revised: 08/17/2014 Document Reviewed: 03/02/2014 °Elsevier Interactive Patient Education ©2016 [employer] ° °Paresthesia °Paresthesia is an abnormal burning or prickling sensation. This sensation is generally felt in the hands, arms, legs, or feet. However, it may occur in any part of the body. Usually, it is not painful. The feeling may be described as: °· Tingling or numbness. °· Pins and needles. °· Skin crawling. °· Buzzing. °· Limbs falling asleep. °· Itching. °Most people experience temporary (transient) paresthesia at some time in their lives. Paresthesia may occur when you breathe too quickly (hyperventilation). It can also occur without any apparent cause. Commonly, paresthesia occurs when pressure is placed on a nerve. The sensation quickly goes away after the pressure is removed. For some people, however, paresthesia is a long-lasting (chronic) condition that is caused by an underlying disorder. If you continue to have paresthesia, you may need   further medical evaluation. °HOME CARE INSTRUCTIONS °Watch your condition for any changes. Taking the following actions may help to lessen any discomfort that you are feeling: °· Avoid drinking alcohol. °· Try acupuncture or massage to help relieve your symptoms. °· Keep all follow-up visits as directed by your health care provider. This is important. °SEEK MEDICAL CARE IF: °· You continue to have  episodes of paresthesia. °· Your burning or prickling feeling gets worse when you walk. °· You have pain, cramps, or dizziness. °· You develop a rash. °SEEK IMMEDIATE MEDICAL CARE IF: °· You feel weak. °· You have trouble walking or moving. °· You have problems with speech, understanding, or vision. °· You feel confused. °· You cannot control your bladder or bowel movements. °· You have numbness after an injury. °· You faint. °  °This information is not intended to replace advice given to you by your health care provider. Make sure you discuss any questions you have with your health care provider. °  °Document Released: 07/17/2002 Document Revised: 12/11/2014 Document Reviewed: 07/23/2014 °Elsevier Interactive Patient Education ©2016 [employer] ° °

## 2015-08-07 NOTE — ED Notes (Addendum)
Pt did take four baby aspirin this am prior to arrival.  Xray completed.

## 2015-08-07 NOTE — ED Provider Notes (Signed)
[facility] Emergency Department Provider Note  Time seen: 10:14 AM  I have reviewed the triage vital signs and the nursing notes.   HISTORY  Chief Complaint Chest Pain and Numbness    HPI Rebecca Allen is a 49 y.o. female with a past medical history of gastric reflux, hyperlipidemia, who presents the emergency department with complaints of cough, left chest pain, tingling sensation in her left face, left arm, right foot.Patient states for the past week or so she has been congested with occasional cough. She states over the past 2 days she has had left-sided chest discomfort. Today she began experiencing left arm tingling around 5:30 this morning, states occasional blurred vision to both her eyes headache and dizziness, also states a tingling sensation in her face mostly her left face, as well as her right foot mostly in the toes. Denies any nausea or vomiting. Denies feeling short of breath or any diaphoresis. Ambulates without difficulty.     Past Medical History  Diagnosis Date  . Treadmill stress test negative for angina pectoris 12/18/2010  . Depression   . GERD (gastroesophageal reflux disease)   . Hyperlipidemia     Patient Active Problem List   Diagnosis Date Noted  . Acute thoracic back pain 07/15/2015  . Viral URI with cough 07/15/2015  . Neck pain on left side 12/18/2014  . Left shoulder pain 12/18/2014  . Post-traumatic stress 10/05/2014  . ETD (eustachian tube dysfunction) 07/18/2014  . Panic attacks 07/25/2013  . LOW BACK PAIN, CHRONIC 05/30/2010  . Generalized anxiety disorder 05/16/2009  . MALAISE AND FATIGUE 05/01/2009  . PERS HX TOBACCO USE PRESENTING HAZARDS HEALTH 05/07/2008  . THYROIDITIS, ACUTE 03/28/2008  . Hyperlipidemia LDL goal <130 03/28/2008  . COMMON MIGRAINE 03/28/2008  . GERD 03/28/2008  . COSTOCHONDRITIS, RECURRENT 03/28/2008  . CHEST PAIN, ATYPICAL 03/28/2008    Past Surgical History  Procedure Laterality  Date  . Partial hysterectomy  08-1999    abdominal    Current Outpatient Rx  Name  Route  Sig  Dispense  Refill  . aspirin 81 MG tablet   Oral   Take 81 mg by mouth daily.         . cyclobenzaprine (FLEXERIL) 5 MG tablet   Oral   Take 1 tablet (5 mg total) by mouth at bedtime as needed for muscle spasms.   15 tablet   0   . estradiol (VIVELLE-DOT) 0.05 MG/24HR patch   Transdermal   Place 1 patch onto the skin 2 (two) times a week. Tuesday and Friday         . ibuprofen (ADVIL) 200 MG tablet   Oral   Take 2 tablets (400 mg total) by mouth every 6 (six) hours as needed for moderate pain (with food).         Marland Kitchen omeprazole (PRILOSEC) 20 MG capsule   Oral   Take 1 capsule (20 mg total) by mouth daily.   30 capsule   11   . traMADol (ULTRAM) 50 MG tablet   Oral   Take 1 tablet (50 mg total) by mouth every 8 (eight) hours as needed.   30 tablet   0     Allergies Metoclopramide hcl and Penicillins  Family History  Problem Relation Age of Onset  . COPD Mother   . Heart attack Father   . Hypertension Father   . Leukemia Brother   . Kidney failure Maternal Grandfather   . COPD Paternal Grandmother  Social History Social History  Substance Use Topics  . Smoking status: Current Every Day Smoker -- 0.30 packs/day for 30 years    Types: Cigarettes  . Smokeless tobacco: Never Used  . Alcohol Use: 0.0 oz/week    0 Standard drinks or equivalent per week     Comment: beer ocassionally    Review of Systems Constitutional: Negative for fever Cardiovascular: Negative for chest pain. Respiratory: Negative for shortness of breath. Gastrointestinal: Negative for abdominal pain Neurological: Negative for headache. Denies focal weakness or numbness over feels tingling in her left upper extremity. 10-point ROS otherwise negative.  ____________________________________________   PHYSICAL EXAM:  VITAL SIGNS: ED Triage Vitals  Enc Vitals Group     BP 08/07/15  0923 98/47 mmHg     Pulse Rate 08/07/15 0921 73     Resp 08/07/15 0921 16     Temp 08/07/15 0921 98.6 F (37 C)     Temp Source 08/07/15 0921 Oral     SpO2 08/07/15 0921 98 %     Weight 08/07/15 0921 168 lb (76.204 kg)     Height 08/07/15 0921 5\' 5"  (1.651 m)     Head Cir --      Peak Flow --      Pain Score 08/07/15 0909 8     Pain Loc --      Pain Edu? --      Excl. in [place]? --     Constitutional: Alert and oriented. Well appearing and in no distress. Eyes: Normal exam, 2-96mm PERRL ENT   Head: Normocephalic and atraumatic   Mouth/Throat: Mucous membranes are moist. Cardiovascular: Normal rate, regular rhythm. No murmur Respiratory: Normal respiratory effort without tachypnea nor retractions. Breath sounds are clear and equal bilaterally. No wheezes/rales/rhonchi. Nontender to palpation. Gastrointestinal: Soft and nontender. No distention.   Musculoskeletal: Nontender with normal range of motion in all extremities. No lower extremity tenderness or edema. Neurologic:  Normal speech and language. No gross focal neurologic deficits  Speech is normal. Initially patient had slight decreased grip strength on the left however with encouragement the patient has equal grip strengths. No pronator drift. Intact cranial nerves. Skin:  Skin is warm, dry and intact.  Psychiatric: Mood and affect are normal. Speech and behavior are normal.   ____________________________________________    EKG  EKG reviewed and interpreted on a social normal sinus rhythm at 73 bpm, narrow QRS, normal axis, normal intervals, no ST changes. Overall normal EKG.  ____________________________________________    RADIOLOGY  Chest x-ray within normal limits CT head negative  ____________________________________________    INITIAL IMPRESSION / ASSESSMENT AND PLAN / ED COURSE  Pertinent labs & imaging results that were available during my care of the patient were reviewed by me and considered in my  medical decision making (see chart for details).  Patient sent with occasional cough, chest discomfort, tingling in her left upper extremity, sometimes tingling in her face, tingling in her right foot. Labs are within normal limits, CT and chest x-ray within normal limits. EKG is reassuring. [name] blood pressures on the lower end however have reviewed the patient's old records, and this is largely her baseline. I discussed with the patient trial of pain medication and follow-up with her primary care physician next 2 days per the patient is agreeable to plan. Discussed strict return precautions.  ____________________________________________   FINAL CLINICAL IMPRESSION(S) / ED DIAGNOSES  Paresthesia Chest pain   Harvest Dark, MD 08/07/15 1230

## 2015-08-14 ENCOUNTER — Encounter: Payer: Self-pay | Admitting: Family Medicine

## 2015-08-14 ENCOUNTER — Ambulatory Visit (INDEPENDENT_AMBULATORY_CARE_PROVIDER_SITE_OTHER): Payer: 59 | Admitting: Family Medicine

## 2015-08-14 VITALS — BP 100/66 | HR 75 | Temp 97.6°F | Ht 65.0 in | Wt 169.5 lb

## 2015-08-14 DIAGNOSIS — F430 Acute stress reaction: Secondary | ICD-10-CM

## 2015-08-14 DIAGNOSIS — F410 Panic disorder [episodic paroxysmal anxiety] without agoraphobia: Secondary | ICD-10-CM

## 2015-08-14 MED ORDER — LORAZEPAM 0.5 MG PO TABS: 0.5000 mg | ORAL_TABLET | Freq: Three times a day (TID) | ORAL | Status: DC | PRN

## 2015-08-14 NOTE — Progress Notes (Signed)
Pre visit review using our clinic review tool, if applicable. No additional management support is needed unless otherwise documented below in the visit note. 
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
## 2016-02-04 ENCOUNTER — Ambulatory Visit: Payer: 59 | Admitting: Family Medicine

## 2016-02-07 ENCOUNTER — Telehealth: Payer: Self-pay | Admitting: Family Medicine

## 2016-02-07 ENCOUNTER — Ambulatory Visit (INDEPENDENT_AMBULATORY_CARE_PROVIDER_SITE_OTHER): Payer: 59 | Admitting: Family Medicine

## 2016-02-07 ENCOUNTER — Encounter: Payer: Self-pay | Admitting: Family Medicine

## 2016-02-07 VITALS — BP 100/68 | HR 74 | Temp 98.5°F | Ht 65.0 in | Wt 167.5 lb

## 2016-02-07 DIAGNOSIS — E785 Hyperlipidemia, unspecified: Secondary | ICD-10-CM

## 2016-02-07 DIAGNOSIS — R591 Generalized enlarged lymph nodes: Secondary | ICD-10-CM | POA: Insufficient documentation

## 2016-02-07 DIAGNOSIS — R5383 Other fatigue: Secondary | ICD-10-CM | POA: Diagnosis not present

## 2016-02-07 DIAGNOSIS — Z13220 Encounter for screening for lipoid disorders: Secondary | ICD-10-CM | POA: Diagnosis not present

## 2016-02-07 DIAGNOSIS — F411 Generalized anxiety disorder: Secondary | ICD-10-CM

## 2016-02-07 DIAGNOSIS — R7303 Prediabetes: Secondary | ICD-10-CM

## 2016-02-07 LAB — CBC WITH DIFFERENTIAL/PLATELET
BASOS ABS: 0 10*3/uL (ref 0.0–0.1)
Basophils Relative: 0.4 % (ref 0.0–3.0)
Eosinophils Absolute: 0.1 10*3/uL (ref 0.0–0.7)
Eosinophils Relative: 1.9 % (ref 0.0–5.0)
HEMATOCRIT: 43.8 % (ref 36.0–46.0)
Hemoglobin: 14.7 g/dL (ref 12.0–15.0)
LYMPHS PCT: 32.8 % (ref 12.0–46.0)
Lymphs Abs: 2.4 10*3/uL (ref 0.7–4.0)
MCHC: 33.5 g/dL (ref 30.0–36.0)
MCV: 91.4 fl (ref 78.0–100.0)
MONOS PCT: 6.9 % (ref 3.0–12.0)
Monocytes Absolute: 0.5 10*3/uL (ref 0.1–1.0)
Neutro Abs: 4.2 10*3/uL (ref 1.4–7.7)
Neutrophils Relative %: 58 % (ref 43.0–77.0)
Platelets: 270 10*3/uL (ref 150.0–400.0)
RBC: 4.79 Mil/uL (ref 3.87–5.11)
RDW: 13.3 % (ref 11.5–15.5)
WBC: 7.3 10*3/uL (ref 4.0–10.5)

## 2016-02-07 LAB — LIPID PANEL
CHOL/HDL RATIO: 4
Cholesterol: 242 mg/dL — ABNORMAL HIGH (ref 0–200)
HDL: 61.8 mg/dL (ref >39.00)
LDL Cholesterol: 148 mg/dL — ABNORMAL HIGH (ref 0–99)
NONHDL: 180.55
TRIGLYCERIDES: 164 mg/dL — AB (ref 0.0–149.0)
VLDL: 32.8 mg/dL (ref 0.0–40.0)

## 2016-02-07 LAB — COMPREHENSIVE METABOLIC PANEL
ALBUMIN: 4.9 g/dL (ref 3.5–5.2)
ALK PHOS: 59 U/L (ref 39–117)
ALT: 44 U/L — AB (ref 0–35)
AST: 33 U/L (ref 0–37)
BILIRUBIN TOTAL: 0.6 mg/dL (ref 0.2–1.2)
BUN: 18 mg/dL (ref 6–23)
CALCIUM: 10.1 mg/dL (ref 8.4–10.5)
CO2: 25 mEq/L (ref 19–32)
Chloride: 104 mEq/L (ref 96–112)
Creatinine, Ser: 0.83 mg/dL (ref 0.40–1.20)
GFR: 77.89 mL/min (ref >60.00)
Glucose, Bld: 95 mg/dL (ref 70–99)
Potassium: 4.2 mEq/L (ref 3.5–5.1)
Sodium: 139 mEq/L (ref 135–145)
TOTAL PROTEIN: 7.6 g/dL (ref 6.0–8.3)

## 2016-02-07 LAB — HEMOGLOBIN A1C: HEMOGLOBIN A1C: 5.5 % (ref 4.6–6.5)

## 2016-02-07 LAB — T4, FREE: FREE T4: 0.81 ng/dL (ref 0.60–1.60)

## 2016-02-07 LAB — MONONUCLEOSIS SCREEN: Mono Screen: NEGATIVE

## 2016-02-07 LAB — T3, FREE: T3, Free: 2.5 pg/mL (ref 2.3–4.2)

## 2016-02-07 LAB — VITAMIN D 25 HYDROXY (VIT D DEFICIENCY, FRACTURES): VITD: 53.79 ng/mL (ref 30.00–100.00)

## 2016-02-07 LAB — VITAMIN B12: Vitamin B-12: 440 pg/mL (ref 211–911)

## 2016-02-07 LAB — TSH: TSH: 1.85 u[IU]/mL (ref 0.35–4.50)

## 2016-02-07 MED ORDER — LORAZEPAM 0.5 MG PO TABS: 0.5000 mg | ORAL_TABLET | Freq: Three times a day (TID) | ORAL | Status: DC | PRN

## 2016-02-07 MED ORDER — AZITHROMYCIN 250 MG PO TABS: ORAL_TABLET | ORAL | Status: DC

## 2016-02-07 NOTE — Assessment & Plan Note (Signed)
Will eval for mono, no symptoms of strep. Eval with cbc as well.  No thyroid enlargement but pt has history of thyroiditis and would like this checked as well.

## 2016-02-07 NOTE — Patient Instructions (Signed)
Stop at lab on way out. 

## 2016-02-07 NOTE — Assessment & Plan Note (Signed)
Due for re-eval. 

## 2016-02-07 NOTE — Assessment & Plan Note (Signed)
Refilled lorazepam.. Pt uses this rarely for panic attacks.

## 2016-02-07 NOTE — Assessment & Plan Note (Signed)
Eval with labs. 

## 2016-02-07 NOTE — Telephone Encounter (Signed)
rx sent in 

## 2016-02-07 NOTE — Addendum Note (Signed)
Addended by: Ellamae Sia on: 02/07/2016 09:49 AM   Modules accepted: Orders

## 2016-02-07 NOTE — Progress Notes (Signed)
Pre visit review using our clinic review tool, if applicable. No additional management support is needed unless otherwise documented below in the visit note. 

## 2016-02-07 NOTE — Progress Notes (Signed)
   Subjective:    Patient ID: Rebecca Allen, female    DOB: 08/30/1965, 50 y.o.   MRN: [id number]  HPI   50 year old female presents with new onset fatigue and throat pain. 1 month ago she noted cramp in right anterior neck, had swollen area... Seem to go down with rubbing.  She continues to have a knot on right anterior neck quarter size on right and smaller on left.  She is sore in bilateral anterior neck. Feels like it comes and goes.  She reports in the last 2 weeks she has noted fatigue and upset stomach.  She has no sore throat inside throat, no fever.  Flonase and allegra controlling allergies. No ear pain.  No sick contacts of mono or strep.  Social History /Family History/Past Medical History reviewed and updated if needed. Hx of thyroiditis. Review of Systems  Constitutional: Positive for fatigue. Negative for fever.  HENT: Negative for ear pain.   Eyes: Negative for pain.  Respiratory: Negative for cough, chest tightness and shortness of breath.   Cardiovascular: Negative for chest pain, palpitations and leg swelling.  Gastrointestinal: Negative for abdominal pain.  Genitourinary: Negative for dysuria.       Objective:   Physical Exam  Constitutional: Vital signs are normal. She appears well-developed and well-nourished. She is cooperative.  Non-toxic appearance. She does not appear ill. No distress.  HENT:  Head: Normocephalic.  Right Ear: Hearing, tympanic membrane, external ear and ear canal normal. Tympanic membrane is not erythematous, not retracted and not bulging.  Left Ear: Hearing, tympanic membrane, external ear and ear canal normal. Tympanic membrane is not erythematous, not retracted and not bulging.  Nose: No mucosal edema or rhinorrhea. Right sinus exhibits no maxillary sinus tenderness and no frontal sinus tenderness. Left sinus exhibits no maxillary sinus tenderness and no frontal sinus tenderness.  Mouth/Throat: Uvula is midline, oropharynx is  clear and moist and mucous membranes are normal.  Eyes: Conjunctivae, EOM and lids are normal. Pupils are equal, round, and reactive to light. Lids are everted and swept, no foreign bodies found.  Neck: Trachea normal and normal range of motion. Neck supple. Carotid bruit is not present. No thyroid mass and no thyromegaly present.  Cardiovascular: Normal rate, regular rhythm, S1 normal, S2 normal, normal heart sounds, intact distal pulses and normal pulses.  Exam reveals no gallop and no friction rub.   No murmur heard. Pulmonary/Chest: Effort normal and breath sounds normal. No tachypnea. No respiratory distress. She has no decreased breath sounds. She has no wheezes. She has no rhonchi. She has no rales.  Abdominal: Soft. Normal appearance and bowel sounds are normal. There is no tenderness.  Lymphadenopathy:    She has cervical adenopathy.       Right cervical: Superficial cervical adenopathy present.  Area of swelling and prominence on right anterior neck.. No discrete ,mass or lymph node.  Neurological: She is alert.  Skin: Skin is warm, dry and intact. No rash noted.  Psychiatric: Her speech is normal and behavior is normal. Judgment and thought content normal. Her mood appears not anxious. Cognition and memory are normal. She does not exhibit a depressed mood.          Assessment & Plan:

## 2016-03-05 ENCOUNTER — Ambulatory Visit: Payer: 59 | Admitting: Family Medicine

## 2016-03-09 ENCOUNTER — Encounter: Payer: Self-pay | Admitting: Family Medicine

## 2016-03-09 ENCOUNTER — Ambulatory Visit (INDEPENDENT_AMBULATORY_CARE_PROVIDER_SITE_OTHER): Payer: 59 | Admitting: Family Medicine

## 2016-03-09 DIAGNOSIS — R591 Generalized enlarged lymph nodes: Secondary | ICD-10-CM

## 2016-03-09 DIAGNOSIS — F411 Generalized anxiety disorder: Secondary | ICD-10-CM | POA: Diagnosis not present

## 2016-03-09 MED ORDER — SERTRALINE HCL 50 MG PO TABS
50.0000 mg | ORAL_TABLET | Freq: Every day | ORAL | 3 refills | Status: DC
Start: 2016-03-09 — End: 2016-04-07

## 2016-03-09 MED ORDER — CLINDAMYCIN HCL 300 MG PO CAPS: 300.0000 mg | ORAL_CAPSULE | Freq: Three times a day (TID) | ORAL | 0 refills | Status: DC

## 2016-03-09 NOTE — Progress Notes (Signed)
Subjective:    Patient ID: Rebecca Allen, female    DOB: 05-18-1966, 50 y.o.   MRN: [id number]  HPI  50 year old female presents for follow up lymphadenopathy 1 month  At last appt on 6/30 pt had noted  "cramp in right anterior neck, had swollen area... Seem to go down with rubbing.  She continues to have a knot on right anterior neck quarter size on right and smaller on left.  She is sore in bilateral anterior neck. Feels like it comes and goes" On my exam at that time no discrete mass was palpated, just area of diffuse swelling  In right anterior neck.  Her mono test was negative. B12, vit D, tsh, cbc nml. S/P azithromycin x 5 daysa  Today she reports anterior  Bilateral neck soreness constatnt, with  intermittent knot on right.  She has been using allegra and flonase for sinuses. Scratchy throt id better.  No current nasal congestion, post nasal drip.  She continues to feel fatigued.  She does have reflux, but it has resolved with using omeprazole 40 mg daily. Only rarely.  She is still smoking, 15 year history or so.. 20 years smoking 1 pack every three days.   She has been more moody, for past several months. Irritable.  Increased stress lately.  trouble sleeping at night. No SI, no HI.   Review of Systems  Constitutional: Positive for fatigue. Negative for fever.  HENT: Negative for ear pain.   Eyes: Negative for pain.  Respiratory: Negative for chest tightness and shortness of breath.   Cardiovascular: Negative for chest pain, palpitations and leg swelling.  Gastrointestinal: Negative for abdominal pain.  Genitourinary: Negative for dysuria.       Objective:   Physical Exam  Constitutional: Vital signs are normal. She appears well-developed and well-nourished. She is cooperative.  Non-toxic appearance. She does not appear ill. No distress.  HENT:  Head: Normocephalic.  Right Ear: Hearing, tympanic membrane, external ear and ear canal normal. Tympanic  membrane is not erythematous, not retracted and not bulging.  Left Ear: Hearing, tympanic membrane, external ear and ear canal normal. Tympanic membrane is not erythematous, not retracted and not bulging.  Nose: No mucosal edema or rhinorrhea. Right sinus exhibits no maxillary sinus tenderness and no frontal sinus tenderness. Left sinus exhibits no maxillary sinus tenderness and no frontal sinus tenderness.  Mouth/Throat: Uvula is midline, oropharynx is clear and moist and mucous membranes are normal.  Eyes: Conjunctivae, EOM and lids are normal. Pupils are equal, round, and reactive to light. Lids are everted and swept, no foreign bodies found.  Neck: Trachea normal and normal range of motion. Neck supple. Carotid bruit is not present. No thyroid mass and no thyromegaly present.  Cardiovascular: Normal rate, regular rhythm, S1 normal, S2 normal, normal heart sounds, intact distal pulses and normal pulses.  Exam reveals no gallop and no friction rub.   No murmur heard. Pulmonary/Chest: Effort normal and breath sounds normal. No tachypnea. No respiratory distress. She has no decreased breath sounds. She has no wheezes. She has no rhonchi. She has no rales.  Abdominal: Soft. Normal appearance and bowel sounds are normal. There is no tenderness.  Lymphadenopathy:    She has cervical adenopathy.       Right cervical: Superficial cervical adenopathy present.  Area of swelling and prominence on right anterior neck. At this OV feels more like lymph node, mobile, tender bilateral anterior neck per pt  Neurological: She is alert.  Skin: Skin is warm, dry and intact. No rash noted.  Psychiatric: Her speech is normal and behavior is normal. Judgment and thought content normal. Her mood appears not anxious. Cognition and memory are normal. She does not exhibit a depressed mood.          Assessment & Plan:

## 2016-03-09 NOTE — Patient Instructions (Addendum)
Complete course of clindamycin daily.  Take probiotc  (align or culturelle) with it.  Start sertraline 50 mg daily at bedtime. Quit smoking!

## 2016-03-09 NOTE — Assessment & Plan Note (Signed)
Poor control. Will start her back on sertraline 50 mg daily. Will have her take at PM to avoid SE of fatigue she has had in the past.

## 2016-03-09 NOTE — Assessment & Plan Note (Signed)
Right anterior, milder on left.  Treat with clindamycin x 10 days.  If not resolved, eval with neck CT.  No clear association with skin infeciton, allergies, GERD, etc.

## 2016-03-09 NOTE — Progress Notes (Signed)
Pre visit review using our clinic review tool, if applicable. No additional management support is needed unless otherwise documented below in the visit note. 

## 2016-03-24 ENCOUNTER — Ambulatory Visit: Payer: 59 | Admitting: Family Medicine

## 2016-03-31 ENCOUNTER — Ambulatory Visit: Payer: 59 | Admitting: Family Medicine

## 2016-03-31 DIAGNOSIS — Z0289 Encounter for other administrative examinations: Secondary | ICD-10-CM

## 2016-04-07 ENCOUNTER — Encounter: Payer: Self-pay | Admitting: Family Medicine

## 2016-04-07 ENCOUNTER — Ambulatory Visit (INDEPENDENT_AMBULATORY_CARE_PROVIDER_SITE_OTHER): Payer: 59 | Admitting: Family Medicine

## 2016-04-07 DIAGNOSIS — M5432 Sciatica, left side: Secondary | ICD-10-CM

## 2016-04-07 MED ORDER — TRAMADOL HCL 50 MG PO TABS: 50.0000 mg | ORAL_TABLET | Freq: Three times a day (TID) | ORAL | 0 refills | Status: DC | PRN

## 2016-04-07 MED ORDER — PREDNISONE 10 MG PO TABS: ORAL_TABLET | ORAL | 0 refills | Status: DC

## 2016-04-07 NOTE — Progress Notes (Signed)
Pre visit review using our clinic review tool, if applicable. No additional management support is needed unless otherwise documented below in the visit note. 

## 2016-04-07 NOTE — Patient Instructions (Addendum)
Pred taper with food.  Sedation caution on tramadol.   Update me in a few days if not better.  No ibuprofen in the meantime.  Keep stretching.  Take care.  Glad to see you.

## 2016-04-07 NOTE — Progress Notes (Signed)
H/o L sided sciatica.  No flares in years.  Then now with return of pain.  She had been walking more recently and then had L sided leg/back pain.  She restarted stretching, heat, ice, ibuprofen, but still with pain.  No R sided sx.  No trauma.  No B/B sx.  No FCNAVD.  She feels well o/w.  No weakness.  Sx started about 1 week ago.    Meds, vitals, and allergies reviewed.   ROS: Per HPI unless specifically indicated in ROS section   GEN: nad, alert and oriented HEENT: mucous membranes moist NECK: supple w/o LA CV: rrr PULM: ctab, no inc wob EXT: no edema SKIN: no acute rash L SLR pos.  Able to bear weight.  No BLE weakness.  Normal sensation.

## 2016-04-08 DIAGNOSIS — M5441 Lumbago with sciatica, right side: Secondary | ICD-10-CM | POA: Insufficient documentation

## 2016-04-08 NOTE — Assessment & Plan Note (Signed)
Pred taper with food.  Sedation caution on tramadol.   Update me in a few days if not better.  No ibuprofen in the meantime.  Keep stretching.  No need for imaging at this point. She agrees. She'll update me later in the week. We can prescribe Vicodin if she still has issues with pain control. Update [place] as needed.

## 2016-04-27 LAB — HM PAP SMEAR: HM PAP: NORMAL

## 2016-04-30 ENCOUNTER — Encounter: Payer: Self-pay | Admitting: Emergency Medicine

## 2016-04-30 ENCOUNTER — Emergency Department
Admission: EM | Admit: 2016-04-30 | Discharge: 2016-04-30 | Disposition: A | Discharge status: Home / Self Care | Payer: 59 | Attending: Emergency Medicine | Admitting: Emergency Medicine

## 2016-04-30 ENCOUNTER — Emergency Department: Payer: 59

## 2016-04-30 DIAGNOSIS — R0789 Other chest pain: Secondary | ICD-10-CM

## 2016-04-30 DIAGNOSIS — M79602 Pain in left arm: Secondary | ICD-10-CM | POA: Insufficient documentation

## 2016-04-30 DIAGNOSIS — F17210 Nicotine dependence, cigarettes, uncomplicated: Secondary | ICD-10-CM | POA: Insufficient documentation

## 2016-04-30 DIAGNOSIS — M792 Neuralgia and neuritis, unspecified: Secondary | ICD-10-CM

## 2016-04-30 DIAGNOSIS — M542 Cervicalgia: Secondary | ICD-10-CM | POA: Diagnosis not present

## 2016-04-30 LAB — CBC
HCT: 42.7 % (ref 35.0–47.0)
HEMOGLOBIN: 14.4 g/dL (ref 12.0–16.0)
MCH: 31.2 pg (ref 26.0–34.0)
MCHC: 33.8 g/dL (ref 32.0–36.0)
MCV: 92.4 fL (ref 80.0–100.0)
PLATELETS: 260 10*3/uL (ref 150–440)
RBC: 4.62 MIL/uL (ref 3.80–5.20)
RDW: 13.5 % (ref 11.5–14.5)
WBC: 7.3 10*3/uL (ref 3.6–11.0)

## 2016-04-30 LAB — BASIC METABOLIC PANEL
ANION GAP: 5 (ref 5–15)
BUN: 18 mg/dL (ref 6–20)
CALCIUM: 9.3 mg/dL (ref 8.9–10.3)
CO2: 27 mmol/L (ref 22–32)
CREATININE: 0.91 mg/dL (ref 0.44–1.00)
Chloride: 106 mmol/L (ref 101–111)
GLUCOSE: 87 mg/dL (ref 65–99)
Potassium: 4.1 mmol/L (ref 3.5–5.1)
Sodium: 138 mmol/L (ref 135–145)

## 2016-04-30 LAB — TROPONIN I

## 2016-04-30 MED ORDER — OXYCODONE-ACETAMINOPHEN 5-325 MG PO TABS
1.0000 | ORAL_TABLET | Freq: Once | ORAL | Status: AC
  Administered 2016-04-30: 1 via ORAL
  Filled 2016-04-30: qty 1

## 2016-04-30 MED ORDER — OXYCODONE-ACETAMINOPHEN 7.5-325 MG PO TABS: 1.0000 | ORAL_TABLET | ORAL | 0 refills | Status: DC | PRN
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
## 2016-04-30 NOTE — ED Triage Notes (Signed)
Pt comes into the ED via POv c/o central chest pain that radiates into her jaw and back.  Patient states she is mildly short of breath and has had chest pain before where they stated it was muscular.  Patient in NAD at this time with even and unlabored respirations.  Patient denies any history of cardiac problems in the past.

## 2016-07-07 ENCOUNTER — Ambulatory Visit (INDEPENDENT_AMBULATORY_CARE_PROVIDER_SITE_OTHER): Payer: 59 | Admitting: Family Medicine

## 2016-07-07 ENCOUNTER — Encounter: Payer: Self-pay | Admitting: Family Medicine

## 2016-07-07 VITALS — BP 110/76 | HR 76 | Temp 98.2°F | Ht 65.0 in | Wt 171.2 lb

## 2016-07-07 DIAGNOSIS — M5412 Radiculopathy, cervical region: Secondary | ICD-10-CM | POA: Insufficient documentation

## 2016-07-07 DIAGNOSIS — M5432 Sciatica, left side: Secondary | ICD-10-CM

## 2016-07-07 DIAGNOSIS — F411 Generalized anxiety disorder: Secondary | ICD-10-CM

## 2016-07-07 DIAGNOSIS — F410 Panic disorder [episodic paroxysmal anxiety] without agoraphobia: Secondary | ICD-10-CM

## 2016-07-07 DIAGNOSIS — Z23 Encounter for immunization: Secondary | ICD-10-CM | POA: Diagnosis not present

## 2016-07-07 DIAGNOSIS — M5442 Lumbago with sciatica, left side: Secondary | ICD-10-CM | POA: Diagnosis not present

## 2016-07-07 DIAGNOSIS — G8929 Other chronic pain: Secondary | ICD-10-CM

## 2016-07-07 MED ORDER — TRAMADOL HCL 50 MG PO TABS: 50.0000 mg | ORAL_TABLET | Freq: Three times a day (TID) | ORAL | 0 refills | Status: DC | PRN

## 2016-07-07 MED ORDER — LORAZEPAM 0.5 MG PO TABS: 0.5000 mg | ORAL_TABLET | Freq: Three times a day (TID) | ORAL | 0 refills | Status: DC | PRN

## 2016-07-07 NOTE — Assessment & Plan Note (Signed)
Continue home PT, tramadol prn pain.

## 2016-07-07 NOTE — Assessment & Plan Note (Signed)
Well controlled, using ativan 6 per month.

## 2016-07-07 NOTE — Patient Instructions (Signed)
Use tramadol for pain as needed with low back.  If not improving call for consideration of prednisone taper.  Continue home stretching, physical therapy.

## 2016-07-07 NOTE — Progress Notes (Signed)
Pre visit review using our clinic review tool, if applicable. No additional management support is needed unless otherwise documented below in the visit note. 

## 2016-07-07 NOTE — Assessment & Plan Note (Signed)
NSAIDs not tolearted. No clear indication for pred taper at this point. Will refill tramadol to use for flares.  Continue home PT.   pt not interested in further steroid injections or potential surgery.
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
## 2016-07-21 ENCOUNTER — Encounter: Payer: Self-pay | Admitting: Primary Care

## 2016-07-21 ENCOUNTER — Ambulatory Visit (INDEPENDENT_AMBULATORY_CARE_PROVIDER_SITE_OTHER): Payer: 59 | Admitting: Primary Care

## 2016-07-21 VITALS — BP 110/82 | HR 90 | Temp 98.0°F | Resp 12 | Wt 170.0 lb

## 2016-07-21 DIAGNOSIS — J209 Acute bronchitis, unspecified: Secondary | ICD-10-CM

## 2016-07-21 MED ORDER — PREDNISONE 20 MG PO TABS: ORAL_TABLET | ORAL | 0 refills | Status: DC

## 2016-07-21 MED ORDER — DOXYCYCLINE HYCLATE 100 MG PO TABS: 100.0000 mg | ORAL_TABLET | Freq: Two times a day (BID) | ORAL | 0 refills | Status: DC

## 2016-07-21 MED ORDER — HYDROCODONE-HOMATROPINE 5-1.5 MG/5ML PO SYRP: 5.0000 mL | ORAL_SOLUTION | Freq: Every evening | ORAL | 0 refills | Status: DC | PRN

## 2016-07-21 NOTE — Patient Instructions (Signed)
Start Doxycycline antibiotic. Take 1 tablet by mouth twice daily for 10 days.  You may take the Hycodan cough suppressant at bedtime as needed for cough and rest. Caution this medication contains codeine and will make you feel drowsy.  Continue Mucinex for congestion.  Ensure you are staying hydrated with water and rest.  It was a pleasure meeting you!

## 2016-07-21 NOTE — Progress Notes (Signed)
Subjective:    Patient ID: Rebecca Allen, female    DOB: 07/26/1966, 50 y.o.   MRN: [id number]  HPI  Rebecca Allen is a 50 year old female who presents today with a chief complaint of cough. She also reports congestion and is unable to cough anything up. She is a current smoker. Her symptoms have been present for the past 2 weeks. She's taken Robitussin, Mucinex, Benzonatate capusles (from old RX) without improvement. She denies fevers, sinus pressure. She's getting much worse.  Review of Systems  Constitutional: Positive for chills and fatigue. Negative for fever.  HENT: Positive for congestion, postnasal drip and sore throat. Negative for ear pain and sinus pressure.   Respiratory: Positive for cough, chest tightness and shortness of breath.        Past Medical History:  Diagnosis Date  . Depression   . GERD (gastroesophageal reflux disease)   . Hyperlipidemia   . Treadmill stress test negative for angina pectoris 12/18/2010     Social History   Social History  . Marital status: Married    Spouse name: N/A  . Number of children: N/A  . Years of education: N/A   Occupational History  . Certified Medical Assistant [employer]    [facility] in [place] Topics  . Smoking status: Current Every Day Smoker    Packs/day: 0.30    Years: 30.00    Types: Cigarettes  . Smokeless tobacco: Never Used  . Alcohol use 0.0 oz/week     Comment: beer ocassionally  . Drug use: No  . Sexual activity: Not on file   Other Topics Concern  . Not on file   Social History Narrative   Regular exercise- yes, occ walking   Diet- fruits and veggies, water     Past Surgical History:  Procedure Laterality Date  . ABDOMINAL HYSTERECTOMY    . CHOLECYSTECTOMY    . PARTIAL HYSTERECTOMY  08-1999   abdominal    Family History  Problem Relation Age of Onset  . COPD Mother   . Heart attack Father   . Hypertension Father   . Leukemia Brother   . Kidney failure  Maternal Grandfather   . COPD Paternal Grandmother     Allergies  Allergen Reactions  . Metoclopramide Hcl     REACTION: Nervousness  . Penicillins     REACTION: Upsets stomach    Current Outpatient Prescriptions on File Prior to Visit  Medication Sig Dispense Refill  . aspirin 81 MG tablet Take 81 mg by mouth daily.    . calcium carbonate (TUMS - DOSED IN MG ELEMENTAL CALCIUM) 500 MG chewable tablet Chew 6 tablets by mouth daily as needed for indigestion or heartburn.    . estradiol (VIVELLE-DOT) 0.05 MG/24HR patch Place 1 patch onto the skin 2 (two) times a week. Tuesday and Friday    . LORazepam (ATIVAN) 0.5 MG tablet Take 1 tablet (0.5 mg total) by mouth every 8 (eight) hours as needed for anxiety. 30 tablet 0  . omeprazole (PRILOSEC) 20 MG capsule Take 1 capsule (20 mg total) by mouth daily. 30 capsule 11  . traMADol (ULTRAM) 50 MG tablet Take 1 tablet (50 mg total) by mouth every 8 (eight) hours as needed. 30 tablet 0   No current facility-administered medications on file prior to visit.     BP 110/82 (BP Location: Left Arm, Patient Position: Sitting, Cuff Size: Normal)   Pulse 90   Temp 98 F (36.7  C) (Oral)   Resp 12   Wt 170 lb (77.1 kg)   SpO2 96%   BMI 28.29 kg/m    Objective:   Physical Exam  Constitutional: She appears well-nourished. She does not appear ill.  HENT:  Right Ear: Tympanic membrane and ear canal normal.  Left Ear: Tympanic membrane and ear canal normal.  Nose: Right sinus exhibits no maxillary sinus tenderness and no frontal sinus tenderness. Left sinus exhibits no maxillary sinus tenderness and no frontal sinus tenderness.  Mouth/Throat: Oropharynx is clear and moist.  Eyes: Conjunctivae are normal.  Neck: Neck supple.  Cardiovascular: Normal rate and regular rhythm.   Pulmonary/Chest: Effort normal. She has wheezes in the right upper field, the right lower field, the left upper field and the left lower field. She has rhonchi in the right  upper field, the right lower field, the left upper field and the left lower field. She has no rales.  Lymphadenopathy:    She has no cervical adenopathy.  Skin: Skin is warm and dry.          Assessment & Plan:  Acute Bronchitis:  Cough, congestion x 2 weeks, feeling worse now. No improvement with OTC treatment. Exam today with moderate rhonchi and mild wheezing throughout all fields. She does appear acutely ill. Vitals stable. Suspect bronchitis, but will cover for possible pneumonia. Rx for Doxycycline and Prednisone sent to pharmacy. Rx printed for Hycodan to use HS PRN. Fluids, rest, continue Mucinex. Follow up PRN.  Sheral Flow, NP

## 2016-09-04 ENCOUNTER — Ambulatory Visit: Payer: 59 | Admitting: Family Medicine

## 2016-09-06 ENCOUNTER — Emergency Department
Admission: EM | Admit: 2016-09-06 | Discharge: 2016-09-06 | Disposition: A | Discharge status: Home / Self Care | Payer: 59 | Attending: Emergency Medicine | Admitting: Emergency Medicine

## 2016-09-06 ENCOUNTER — Emergency Department: Payer: 59

## 2016-09-06 DIAGNOSIS — Z79899 Other long term (current) drug therapy: Secondary | ICD-10-CM | POA: Insufficient documentation

## 2016-09-06 DIAGNOSIS — F410 Panic disorder [episodic paroxysmal anxiety] without agoraphobia: Secondary | ICD-10-CM | POA: Insufficient documentation

## 2016-09-06 DIAGNOSIS — R079 Chest pain, unspecified: Secondary | ICD-10-CM

## 2016-09-06 DIAGNOSIS — F17210 Nicotine dependence, cigarettes, uncomplicated: Secondary | ICD-10-CM | POA: Diagnosis not present

## 2016-09-06 DIAGNOSIS — R0602 Shortness of breath: Secondary | ICD-10-CM | POA: Diagnosis not present

## 2016-09-06 DIAGNOSIS — Z7982 Long term (current) use of aspirin: Secondary | ICD-10-CM | POA: Diagnosis not present

## 2016-09-06 LAB — BASIC METABOLIC PANEL
ANION GAP: 9 (ref 5–15)
BUN: 15 mg/dL (ref 6–20)
CALCIUM: 10.3 mg/dL (ref 8.9–10.3)
CHLORIDE: 102 mmol/L (ref 101–111)
CO2: 26 mmol/L (ref 22–32)
Creatinine, Ser: 0.83 mg/dL (ref 0.44–1.00)
GFR calc non Af Amer: >60 mL/min (ref >60)
GLUCOSE: 96 mg/dL (ref 65–99)
Potassium: 4.1 mmol/L (ref 3.5–5.1)
Sodium: 137 mmol/L (ref 135–145)

## 2016-09-06 LAB — CBC
HCT: 41.6 % (ref 35.0–47.0)
HEMOGLOBIN: 14.7 g/dL (ref 12.0–16.0)
MCH: 31.8 pg (ref 26.0–34.0)
MCHC: 35.3 g/dL (ref 32.0–36.0)
MCV: 90.2 fL (ref 80.0–100.0)
Platelets: 286 10*3/uL (ref 150–440)
RBC: 4.61 MIL/uL (ref 3.80–5.20)
RDW: 13.1 % (ref 11.5–14.5)
WBC: 7.2 10*3/uL (ref 3.6–11.0)

## 2016-09-06 LAB — FIBRIN DERIVATIVES D-DIMER (ARMC ONLY): Fibrin derivatives D-dimer (ARMC): 232 (ref 0–499)

## 2016-09-06 LAB — TROPONIN I
Troponin I: <0.03 ng/mL (ref <0.03)
Troponin I: <0.03 ng/mL (ref <0.03)

## 2016-09-06 MED ORDER — LORAZEPAM 2 MG/ML IJ SOLN
2.0000 mg | Freq: Once | INTRAMUSCULAR | Status: AC
  Administered 2016-09-06: 2 mg via INTRAMUSCULAR
  Filled 2016-09-06: qty 1

## 2016-09-06 MED ORDER — IOPAMIDOL (ISOVUE-370) INJECTION 76%
75.0000 mL | Freq: Once | INTRAVENOUS | Status: AC | PRN
  Administered 2016-09-06: 75 mL via INTRAVENOUS
  Filled 2016-09-06: qty 75

## 2016-09-06 MED ORDER — LORAZEPAM 2 MG/ML IJ SOLN: 1.0000 mg | Freq: Once | INTRAMUSCULAR | Status: DC

## 2016-09-06 NOTE — ED Triage Notes (Signed)
Pt states she history of panic attacks - she reports that she took Ativan 4 hours ago and no relief - at this time c/o chest pain that radiates into back and shortness ofr breath - pt appears in no actue distress - color is race appropriate and respirations are even and unlabored

## 2016-09-06 NOTE — ED Notes (Signed)
Patient transported to CT 

## 2016-09-06 NOTE — Discharge Instructions (Signed)

## 2016-09-06 NOTE — ED Provider Notes (Signed)
[facility] Emergency Department Provider Note  ____________________________________________  Time seen: Approximately 4:44 PM  I have reviewed the triage vital signs and the nursing notes.   HISTORY  Chief Complaint Shortness of Breath   HPI Rebecca Allen is a 51 y.o. female history of depression and panic attacks presents for evaluation of panic attack. Patient reports she was watching a movie when she started having a panic attack and started hyperventilating. She took 2 half a milligram Ativan with no improvement. She then started having chest pain that she describes as a knife stabbing her in her chest going all the way to her back that is pleuritic and worse when she lays back, currently 8/10 and associated with shortness of breath. Patient denies ever having similar pain in the setting of a panic attack. She has family history of ischemic heart disease at early age in her father. She is a smoker. She uses an estrogen patch. No leg pain or swelling, no personal or family history of blood clots, no hemoptysis, no recent travel or immobilization. She does not have any other medical problems for which she takes medications.  Past Medical History:  Diagnosis Date  . Depression   . GERD (gastroesophageal reflux disease)   . Hyperlipidemia   . Treadmill stress test negative for angina pectoris 12/18/2010    Patient Active Problem List   Diagnosis Date Noted  . Chronic cervical radiculopathy 07/07/2016  . Left sided sciatica 04/08/2016  . Lymphadenopathy 02/07/2016  . Post-traumatic stress 10/05/2014  . Panic attacks 07/25/2013  . LOW BACK PAIN, CHRONIC 05/30/2010  . Generalized anxiety disorder 05/16/2009  . Fatigue 05/01/2009  . PERS HX TOBACCO USE PRESENTING HAZARDS HEALTH 05/07/2008  . Hyperlipidemia LDL goal <130 03/28/2008  . COMMON MIGRAINE 03/28/2008  . GERD 03/28/2008  . COSTOCHONDRITIS, RECURRENT 03/28/2008  . CHEST PAIN, ATYPICAL  03/28/2008    Past Surgical History:  Procedure Laterality Date  . ABDOMINAL HYSTERECTOMY    . CHOLECYSTECTOMY    . PARTIAL HYSTERECTOMY  08-1999   abdominal    Prior to Admission medications   Medication Sig Start Date End Date Taking? Authorizing Provider  aspirin 81 MG tablet Take 81 mg by mouth daily.    Historical Provider, MD  calcium carbonate (TUMS - DOSED IN MG ELEMENTAL CALCIUM) 500 MG chewable tablet Chew 6 tablets by mouth daily as needed for indigestion or heartburn.    Historical Provider, MD  doxycycline (VIBRA-TABS) 100 MG tablet Take 1 tablet (100 mg total) by mouth 2 (two) times daily. 07/21/16   Pleas Koch, NP  estradiol (VIVELLE-DOT) 0.05 MG/24HR patch Place 1 patch onto the skin 2 (two) times a week. Tuesday and Friday 10/03/14   Historical Provider, MD  HYDROcodone-homatropine (HYCODAN) 5-1.5 MG/5ML syrup Take 5 mLs by mouth at bedtime as needed. 07/21/16   Pleas Koch, NP  LORazepam (ATIVAN) 0.5 MG tablet Take 1 tablet (0.5 mg total) by mouth every 8 (eight) hours as needed for anxiety. 07/07/16   Amy Cletis Athens, MD  omeprazole (PRILOSEC) 20 MG capsule Take 1 capsule (20 mg total) by mouth daily. 07/15/15   Amy Cletis Athens, MD  predniSONE (DELTASONE) 20 MG tablet Take 2 tablets by mouth daily for 5 days. 07/21/16   Pleas Koch, NP  traMADol (ULTRAM) 50 MG tablet Take 1 tablet (50 mg total) by mouth every 8 (eight) hours as needed. 07/07/16   Amy Cletis Athens, MD    Allergies Metoclopramide hcl and  Penicillins  Family History  Problem Relation Age of Onset  . COPD Mother   . Heart attack Father   . Hypertension Father   . Leukemia Brother   . Kidney failure Maternal Grandfather   . COPD Paternal Grandmother     Social History Social History  Substance Use Topics  . Smoking status: Current Every Day Smoker    Packs/day: 0.30    Years: 30.00    Types: Cigarettes  . Smokeless tobacco: Never Used  . Alcohol use 0.0 oz/week     Comment: beer  ocassionally    Review of Systems  Constitutional: Negative for fever. Eyes: Negative for visual changes. ENT: Negative for sore throat. Neck: No neck pain  Cardiovascular: + chest pain. Respiratory: + shortness of breath. Gastrointestinal: Negative for abdominal pain, vomiting or diarrhea. Genitourinary: Negative for dysuria. Musculoskeletal: Negative for back pain. Skin: Negative for rash. Neurological: Negative for headaches, weakness or numbness. Psych: No SI or HI  ____________________________________________   PHYSICAL EXAM:  VITAL SIGNS: ED Triage Vitals [09/06/16 1527]  Enc Vitals Group     BP (!) 80/53     Pulse Rate 77     Resp 16     Temp 98.5 F (36.9 C)     Temp Source Oral     SpO2 97 %     Weight 163 lb (73.9 kg)     Height 5\' 5"  (1.651 m)     Head Circumference      Peak Flow      Pain Score 10     Pain Loc      Pain Edu?      Excl. in [place]?     Constitutional: Alert and oriented, hyperventilating, crying.  HEENT:      Head: Normocephalic and atraumatic.         Eyes: Conjunctivae are normal. Sclera is non-icteric. EOMI. PERRL      Mouth/Throat: Mucous membranes are moist.       Neck: Supple with no signs of meningismus. Cardiovascular: Regular rate and rhythm. No murmurs, gallops, or rubs. 2+ symmetrical distal pulses are present in all extremities. No JVD. Respiratory: Normal respiratory effort. Lungs are clear to auscultation bilaterally. No wheezes, crackles, or rhonchi.  Gastrointestinal: Soft, non tender, and non distended with positive bowel sounds. No rebound or guarding. Genitourinary: No CVA tenderness. Musculoskeletal: Nontender with normal range of motion in all extremities. No edema, cyanosis, or erythema of extremities. Neurologic: Normal speech and language. Face is symmetric. Moving all extremities. No gross focal neurologic deficits are appreciated. Skin: Skin is warm, dry and intact. No rash noted. Psychiatric: Mood and affect  are normal. Speech and behavior are normal.  ____________________________________________   LABS (all labs ordered are listed, but only abnormal results are displayed)  Labs Reviewed  BASIC METABOLIC PANEL  CBC  TROPONIN I  FIBRIN DERIVATIVES D-DIMER ([facility] ONLY)  TROPONIN I   ____________________________________________  EKG  ED ECG REPORT I, Rudene Re, the attending physician, personally viewed and interpreted this ECG.  Normal sinus rhythm, rate of 73, normal intervals, normal axis, slight depressions. ____________________________________________  RADIOLOGY  CXR: Negative   CTA chest: Negative ____________________________________________   PROCEDURES  Procedure(s) performed: None Procedures Critical Care performed:  None ____________________________________________   INITIAL IMPRESSION / ASSESSMENT AND PLAN / ED COURSE   51 y.o. female history of depression and panic attacks presents for evaluation of panic attack and now with pleuritic sharp chest pain radiating to the back associated with shortness  of breath. Patient does use exogenous hormones therefore we'll send a d-dimer. EKG with no evidence of ischemia. [name] blood pressure during my evaluation was 104/72 and patient tells me that her blood pressures usually in the low 90s. Symmetric equal pulses in bilateral upper extremities. We'll give 2 mg of IM Ativan and watch patient on telemetry.    _________________________ 8:52 PM on 09/06/2016 -----------------------------------------  Patient feels markedly improved. CT of the chest with no evidence of PE or dissection. Troponin 2 is negative. Patient be discharged home with close follow-up with primary care doctor.  Pertinent labs & imaging results that were available during my care of the patient were reviewed by me and considered in my medical decision making (see chart for details).    ____________________________________________   FINAL  CLINICAL IMPRESSION(S) / ED DIAGNOSES  Final diagnoses:  Chest pain, unspecified type  Panic attack      NEW MEDICATIONS STARTED DURING THIS VISIT:  New Prescriptions   No medications on file     Note:  This document was prepared using Dragon voice recognition software and may include unintentional dictation errors.    Rudene Re, MD 09/06/16 2053

## 2016-09-06 NOTE — ED Notes (Signed)
See triage note  States she has a hx of panic attacks and feels like she started with one about 4 hours ago  Took ativan w/o relief  States she is having some chest discomfort and some tingling in arms  Tearful on arrival

## 2016-09-08 ENCOUNTER — Ambulatory Visit (INDEPENDENT_AMBULATORY_CARE_PROVIDER_SITE_OTHER): Payer: 59 | Admitting: Family Medicine

## 2016-09-08 ENCOUNTER — Encounter: Payer: Self-pay | Admitting: Family Medicine

## 2016-09-08 VITALS — BP 104/72 | HR 69 | Temp 97.8°F | Ht 65.0 in | Wt 172.5 lb

## 2016-09-08 DIAGNOSIS — F410 Panic disorder [episodic paroxysmal anxiety] without agoraphobia: Secondary | ICD-10-CM

## 2016-09-08 DIAGNOSIS — F411 Generalized anxiety disorder: Secondary | ICD-10-CM | POA: Diagnosis not present

## 2016-09-08 MED ORDER — VENLAFAXINE HCL ER 37.5 MG PO TB24: ORAL_TABLET | ORAL | 3 refills | Status: DC

## 2016-09-08 MED ORDER — ALPRAZOLAM 0.5 MG PO TABS: 0.5000 mg | ORAL_TABLET | Freq: Two times a day (BID) | ORAL | 0 refills | Status: DC | PRN

## 2016-09-08 MED ORDER — ALPRAZOLAM 0.5 MG PO TBDP
0.5000 mg | ORAL_TABLET | Freq: Two times a day (BID) | ORAL | 0 refills | Status: DC | PRN
Start: 2016-09-08 — End: 2016-09-08

## 2016-09-08 NOTE — Patient Instructions (Addendum)
Stop at front desk to set up counselor.  Start venlafaxine 37.5 mg daily x 1 week then increase to 2 tabs daily at bedtime.  Use alprazolam instead of ativan for acute panic attacks.

## 2016-09-08 NOTE — Assessment & Plan Note (Signed)
Poor control. Start venlafaxine, refer to counselor, alprazolam for acute panic attacks.  Close follow up in 4 weeks.

## 2016-09-08 NOTE — Progress Notes (Signed)
   Subjective:    Patient ID: Rebecca Allen, female    DOB: [birth year], 51 y.o.   MRN: [id number]  HPI   51 year old female with history of GAD and panic disorder presents after ER visit in last few days for chest pain and panic attack.  ER note reviewed from 1/28: panic attacks, shortness of breath that lead to the chest pain.  She has been feeling overwhelmed lately. She has been under a lot of stress.  She has been struggling with anxiety in last 2 weeks. She previously had needed ativan prn.. Has used it 5 times in last week. She is excessively worried.  Has trouble staying asleep.  No depression, no sadness, no irritability.  Zoloft in past made her feel groggy despite taking at bedtime.  Did not help much.   She feels like alprazolam helps her feel better.  EKG: NSR CXR: neg  Chest CT: neg  Troponin I - x 2  ddimer neg   Review of Systems  Constitutional: Negative for fatigue and fever.  HENT: Negative for ear pain.   Eyes: Negative for pain.  Respiratory: Negative for chest tightness and shortness of breath.   Cardiovascular: Negative for chest pain, palpitations and leg swelling.  Gastrointestinal: Negative for abdominal pain.  Genitourinary: Negative for dysuria.       Objective:   Physical Exam  Constitutional: Vital signs are normal. She appears well-developed and well-nourished. She is cooperative.  Non-toxic appearance. She does not appear ill. No distress.  HENT:  Head: Normocephalic.  Right Ear: Hearing, tympanic membrane, external ear and ear canal normal. Tympanic membrane is not erythematous, not retracted and not bulging.  Left Ear: Hearing, tympanic membrane, external ear and ear canal normal. Tympanic membrane is not erythematous, not retracted and not bulging.  Nose: No mucosal edema or rhinorrhea. Right sinus exhibits no maxillary sinus tenderness and no frontal sinus tenderness. Left sinus exhibits no maxillary sinus tenderness and no  frontal sinus tenderness.  Mouth/Throat: Uvula is midline, oropharynx is clear and moist and mucous membranes are normal.  Eyes: Conjunctivae, EOM and lids are normal. Pupils are equal, round, and reactive to light. Lids are everted and swept, no foreign bodies found.  Neck: Trachea normal and normal range of motion. Neck supple. Carotid bruit is not present. No thyroid mass and no thyromegaly present.  Cardiovascular: Normal rate, regular rhythm, S1 normal, S2 normal, normal heart sounds, intact distal pulses and normal pulses.  Exam reveals no gallop and no friction rub.   No murmur heard. Pulmonary/Chest: Effort normal and breath sounds normal. No tachypnea. No respiratory distress. She has no decreased breath sounds. She has no wheezes. She has no rhonchi. She has no rales.  Abdominal: Soft. Normal appearance and bowel sounds are normal. There is no tenderness.  Neurological: She is alert.  Skin: Skin is warm, dry and intact. No rash noted.  Psychiatric: Her speech is normal and behavior is normal. Judgment and thought content normal. Her mood appears not anxious. Cognition and memory are normal. She does not exhibit a depressed mood.          Assessment & Plan:

## 2016-09-08 NOTE — Progress Notes (Signed)
Pre visit review using our clinic review tool, if applicable. No additional management support is needed unless otherwise documented below in the visit note. 

## 2016-09-10 ENCOUNTER — Ambulatory Visit (INDEPENDENT_AMBULATORY_CARE_PROVIDER_SITE_OTHER): Payer: 59 | Admitting: Psychology

## 2016-09-10 DIAGNOSIS — F331 Major depressive disorder, recurrent, moderate: Secondary | ICD-10-CM | POA: Diagnosis not present

## 2016-09-10 DIAGNOSIS — F410 Panic disorder [episodic paroxysmal anxiety] without agoraphobia: Secondary | ICD-10-CM | POA: Diagnosis not present

## 2016-10-06 ENCOUNTER — Ambulatory Visit: Payer: 59 | Admitting: Family Medicine

## 2016-10-12 ENCOUNTER — Ambulatory Visit (INDEPENDENT_AMBULATORY_CARE_PROVIDER_SITE_OTHER): Payer: 59 | Admitting: Psychology

## 2016-10-12 DIAGNOSIS — F331 Major depressive disorder, recurrent, moderate: Secondary | ICD-10-CM | POA: Diagnosis not present

## 2016-10-12 DIAGNOSIS — F411 Generalized anxiety disorder: Secondary | ICD-10-CM

## 2016-10-20 ENCOUNTER — Encounter: Payer: Self-pay | Admitting: Family Medicine

## 2016-10-20 ENCOUNTER — Ambulatory Visit (INDEPENDENT_AMBULATORY_CARE_PROVIDER_SITE_OTHER): Payer: 59 | Admitting: Family Medicine

## 2016-10-20 VITALS — BP 90/74 | HR 83 | Temp 98.2°F | Ht 65.0 in | Wt 172.2 lb

## 2016-10-20 DIAGNOSIS — F411 Generalized anxiety disorder: Secondary | ICD-10-CM

## 2016-10-20 DIAGNOSIS — F410 Panic disorder [episodic paroxysmal anxiety] without agoraphobia: Secondary | ICD-10-CM | POA: Diagnosis not present

## 2016-10-20 MED ORDER — ALPRAZOLAM 0.5 MG PO TABS: 0.5000 mg | ORAL_TABLET | Freq: Two times a day (BID) | ORAL | 0 refills | Status: DC | PRN

## 2016-10-20 NOTE — Patient Instructions (Signed)
Continue counseling and stress reduction , relaxation techniques.  Let me know if continued issues with mood. Limit alprazolam as needed.

## 2016-10-20 NOTE — Assessment & Plan Note (Signed)
She is doing better overall with counseling.. Will continue and increase in frequency.  Working on [employer].  pt not interested in additional med given SE of fatigue with zoloft and venlafaxine.

## 2016-10-20 NOTE — Assessment & Plan Note (Signed)
Using alprazolam 1/2 tab 2-3 times a week for panic attacks.

## 2016-10-20 NOTE — Progress Notes (Signed)
Pre visit review using our clinic review tool, if applicable. No additional management support is needed unless otherwise documented below in the visit note. 

## 2016-10-20 NOTE — Progress Notes (Signed)
   Subjective:    Patient ID: Rebecca Allen, female    DOB: 12-19-1965, 51 y.o.   MRN: [id number]  HPI   51 year old female presents for 4 week follow up on mood.  She was seen on 1/30 for GAD and panic attacks.   She was started on venlafaxine 37.5 titrated up to 75 mg daily.   10/20/16 Today she reports she was unable to tolerate venlafaxine.. She could not function because it made her tired. She took for 2 weeks.. SE did not improve.   She feels that her mood is much better.  She has had an improvement with counseling. She really enjoys this. Plan on meditation. She is seeing her once every 3 weeks.  She has continued to have panic attacks every 2-3 times a week. Uses 1/2 tab of alprazolam to treat.  No clear triggers.  Working on IT trainer.   GAD 12/21  BP Readings from Last 3 Encounters:  10/20/16 90/74  09/08/16 104/72  09/06/16 (!) 114/58     Review of Systems  Constitutional: Negative for fatigue and fever.  HENT: Negative for ear pain.   Eyes: Negative for pain.  Respiratory: Negative for chest tightness and shortness of breath.   Cardiovascular: Negative for chest pain, palpitations and leg swelling.  Gastrointestinal: Negative for abdominal pain.  Genitourinary: Negative for dysuria.       Objective:   Physical Exam  Constitutional: Vital signs are normal. She appears well-developed and well-nourished. She is cooperative.  Non-toxic appearance. She does not appear ill. No distress.  HENT:  Head: Normocephalic.  Right Ear: Hearing, tympanic membrane, external ear and ear canal normal. Tympanic membrane is not erythematous, not retracted and not bulging.  Left Ear: Hearing, tympanic membrane, external ear and ear canal normal. Tympanic membrane is not erythematous, not retracted and not bulging.  Nose: No mucosal edema or rhinorrhea. Right sinus exhibits no maxillary sinus tenderness and no frontal sinus tenderness. Left sinus exhibits no  maxillary sinus tenderness and no frontal sinus tenderness.  Mouth/Throat: Uvula is midline, oropharynx is clear and moist and mucous membranes are normal.  Eyes: Conjunctivae, EOM and lids are normal. Pupils are equal, round, and reactive to light. Lids are everted and swept, no foreign bodies found.  Neck: Trachea normal and normal range of motion. Neck supple. Carotid bruit is not present. No thyroid mass and no thyromegaly present.  Cardiovascular: Normal rate, regular rhythm, S1 normal, S2 normal, normal heart sounds, intact distal pulses and normal pulses.  Exam reveals no gallop and no friction rub.   No murmur heard. Pulmonary/Chest: Effort normal and breath sounds normal. No tachypnea. No respiratory distress. She has no decreased breath sounds. She has no wheezes. She has no rhonchi. She has no rales.  Abdominal: Soft. Normal appearance and bowel sounds are normal. There is no tenderness.  Neurological: She is alert.  Skin: Skin is warm, dry and intact. No rash noted.  Psychiatric: Her speech is normal and behavior is normal. Judgment and thought content normal. Her mood appears not anxious. Cognition and memory are normal. She does not exhibit a depressed mood.          Assessment & Plan:

## 2016-10-27 ENCOUNTER — Ambulatory Visit: Payer: 59 | Admitting: Psychology

## 2016-11-19 ENCOUNTER — Ambulatory Visit: Payer: 59 | Admitting: Psychology

## 2016-12-10 ENCOUNTER — Ambulatory Visit (INDEPENDENT_AMBULATORY_CARE_PROVIDER_SITE_OTHER): Payer: 59 | Admitting: Psychology

## 2016-12-10 DIAGNOSIS — F410 Panic disorder [episodic paroxysmal anxiety] without agoraphobia: Secondary | ICD-10-CM

## 2016-12-10 DIAGNOSIS — F331 Major depressive disorder, recurrent, moderate: Secondary | ICD-10-CM | POA: Diagnosis not present

## 2016-12-24 ENCOUNTER — Ambulatory Visit: Payer: 59 | Admitting: Psychology

## 2017-01-07 ENCOUNTER — Ambulatory Visit: Payer: 59 | Admitting: Psychology

## 2017-01-21 ENCOUNTER — Ambulatory Visit: Payer: 59 | Admitting: Psychology

## 2017-01-26 ENCOUNTER — Ambulatory Visit (INDEPENDENT_AMBULATORY_CARE_PROVIDER_SITE_OTHER): Payer: 59 | Admitting: Family Medicine

## 2017-01-26 ENCOUNTER — Encounter: Payer: Self-pay | Admitting: Family Medicine

## 2017-01-26 ENCOUNTER — Telehealth: Payer: Self-pay

## 2017-01-26 VITALS — HR 90 | Temp 98.4°F | Ht 65.0 in | Wt 175.0 lb

## 2017-01-26 DIAGNOSIS — R5383 Other fatigue: Secondary | ICD-10-CM

## 2017-01-26 DIAGNOSIS — R002 Palpitations: Secondary | ICD-10-CM

## 2017-01-26 DIAGNOSIS — R0789 Other chest pain: Secondary | ICD-10-CM

## 2017-01-26 DIAGNOSIS — E162 Hypoglycemia, unspecified: Secondary | ICD-10-CM

## 2017-01-26 DIAGNOSIS — E785 Hyperlipidemia, unspecified: Secondary | ICD-10-CM

## 2017-01-26 DIAGNOSIS — R197 Diarrhea, unspecified: Secondary | ICD-10-CM

## 2017-01-26 DIAGNOSIS — R1084 Generalized abdominal pain: Secondary | ICD-10-CM | POA: Diagnosis not present

## 2017-01-26 DIAGNOSIS — F17200 Nicotine dependence, unspecified, uncomplicated: Secondary | ICD-10-CM

## 2017-01-26 LAB — CBC WITH DIFFERENTIAL/PLATELET
BASOS ABS: 0 10*3/uL (ref 0.0–0.1)
Basophils Relative: 0.5 % (ref 0.0–3.0)
EOS ABS: 0.1 10*3/uL (ref 0.0–0.7)
Eosinophils Relative: 1.7 % (ref 0.0–5.0)
HCT: 44.4 % (ref 36.0–46.0)
Hemoglobin: 15 g/dL (ref 12.0–15.0)
LYMPHS ABS: 2.4 10*3/uL (ref 0.7–4.0)
Lymphocytes Relative: 34.8 % (ref 12.0–46.0)
MCHC: 33.7 g/dL (ref 30.0–36.0)
MCV: 93.2 fl (ref 78.0–100.0)
MONO ABS: 0.5 10*3/uL (ref 0.1–1.0)
Monocytes Relative: 7 % (ref 3.0–12.0)
NEUTROS ABS: 3.9 10*3/uL (ref 1.4–7.7)
NEUTROS PCT: 56 % (ref 43.0–77.0)
PLATELETS: 265 10*3/uL (ref 150.0–400.0)
RBC: 4.77 Mil/uL (ref 3.87–5.11)
RDW: 13.9 % (ref 11.5–15.5)
WBC: 7 10*3/uL (ref 4.0–10.5)

## 2017-01-26 LAB — BASIC METABOLIC PANEL
BUN: 17 mg/dL (ref 6–23)
CALCIUM: 9.7 mg/dL (ref 8.4–10.5)
CO2: 27 meq/L (ref 19–32)
CREATININE: 0.82 mg/dL (ref 0.40–1.20)
Chloride: 104 mEq/L (ref 96–112)
GFR: 78.67 mL/min (ref >60.00)
GLUCOSE: 90 mg/dL (ref 70–99)
Potassium: 4.1 mEq/L (ref 3.5–5.1)
Sodium: 139 mEq/L (ref 135–145)

## 2017-01-26 LAB — HEPATIC FUNCTION PANEL
ALBUMIN: 4.8 g/dL (ref 3.5–5.2)
ALK PHOS: 53 U/L (ref 39–117)
ALT: 39 U/L — ABNORMAL HIGH (ref 0–35)
AST: 23 U/L (ref 0–37)
BILIRUBIN DIRECT: 0.1 mg/dL (ref 0.0–0.3)
Total Bilirubin: 0.4 mg/dL (ref 0.2–1.2)
Total Protein: 7.2 g/dL (ref 6.0–8.3)

## 2017-01-26 LAB — LIPID PANEL
Cholesterol: 268 mg/dL — ABNORMAL HIGH (ref 0–200)
HDL: 58.5 mg/dL (ref >39.00)
LDL Cholesterol: 171 mg/dL — ABNORMAL HIGH (ref 0–99)
NonHDL: 209.5
Total CHOL/HDL Ratio: 5
Triglycerides: 192 mg/dL — ABNORMAL HIGH (ref 0.0–149.0)
VLDL: 38.4 mg/dL (ref 0.0–40.0)

## 2017-01-26 LAB — HEMOGLOBIN A1C: Hgb A1c MFr Bld: 5.7 % (ref 4.6–6.5)

## 2017-01-26 LAB — T3, FREE: T3 FREE: 2.8 pg/mL (ref 2.3–4.2)

## 2017-01-26 LAB — T4, FREE: Free T4: 0.82 ng/dL (ref 0.60–1.60)

## 2017-01-26 LAB — TSH: TSH: 1.38 u[IU]/mL (ref 0.35–4.50)

## 2017-01-26 MED ORDER — ALPRAZOLAM 0.5 MG PO TABS: 0.5000 mg | ORAL_TABLET | Freq: Two times a day (BID) | ORAL | 0 refills | Status: DC | PRN

## 2017-01-26 NOTE — Patient Instructions (Signed)

## 2017-01-26 NOTE — Progress Notes (Signed)
Dr. Frederico Hamman T. Oralia Criger, MD, [facility] [place], [address] Phone: [phone] Fax: [phone]  01/26/2017  Patient: Rebecca Allen, MRN: [id number], DOB: 04/17/1966, 51 y.o.  Primary Physician:  Jinny Sanders, MD   Chief Complaint  Patient presents with  . Abdominal Discomfort    BMs not normal. Either alot of diarrhea or nothing at all.  . Palpitations    Started a few days ago. This is a new thing for her.  . Hot Flashes    On estradiol.  . Fatigue   Subjective:   Rebecca Allen is a 51 y.o. very pleasant female patient who presents with the following:  Patient of Dr. Rometta Emery presents today with a history that is significant for generalized anxiety, panic disorder, and history PTSD, as well as hyperlipidemia, smoking.  She presents today with multiple complaints including diffuse abdominal pain throughout her entire abdomen, right lower quadrant pain, epigastric tenderness, P she does have a history of prior cholecystectomy as well as hysterectomy.  She has been evaluated twice in the emergency room for chest pain in the last 9 months. Multiple other episodes for chest pain evaluated in the ER previously. Today she says that she is having palpitations that are distinctly different from her panic attacks. She had a stress test that was normal in 2012.  Some cramps in her legs. Will also have some abd cramping.  Feels like heart hard to breath.   Went to [place] over the weekend.  No pain in the popliteal region.  Past Medical History, Surgical History, Social History, Family History, Problem List, Medications, and Allergies have been reviewed and updated if relevant.  Patient Active Problem List   Diagnosis Date Noted  . Chronic cervical radiculopathy 07/07/2016  . Left sided sciatica 04/08/2016  . Lymphadenopathy 02/07/2016  . Post-traumatic stress 10/05/2014  . Panic attacks 07/25/2013  .  LOW BACK PAIN, CHRONIC 05/30/2010  . Generalized anxiety disorder 05/16/2009  . Fatigue 05/01/2009  . PERS HX TOBACCO USE PRESENTING HAZARDS HEALTH 05/07/2008  . Hyperlipidemia LDL goal <130 03/28/2008  . COMMON MIGRAINE 03/28/2008  . GERD 03/28/2008  . COSTOCHONDRITIS, RECURRENT 03/28/2008  . CHEST PAIN, ATYPICAL 03/28/2008    Past Medical History:  Diagnosis Date  . Depression   . GERD (gastroesophageal reflux disease)   . Hyperlipidemia   . Treadmill stress test negative for angina pectoris 12/18/2010    Past Surgical History:  Procedure Laterality Date  . ABDOMINAL HYSTERECTOMY    . CHOLECYSTECTOMY    . PARTIAL HYSTERECTOMY  08-1999   abdominal    Social History   Social History  . Marital status: Married    Spouse name: N/A  . Number of children: N/A  . Years of education: N/A   Occupational History  . 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 Smoking status: Current Every Day Smoker    Packs/day: 0.30    Years: 30.00    Types: Cigarettes  . Smokeless tobacco: Never Used  . Alcohol use 0.0 oz/week     Comment: beer ocassionally  . Drug use: No  . Sexual activity: Not on file   Other Topics Concern  . Not on file   Social History Narrative   Regular exercise- yes, occ walking   Diet- fruits and veggies, water     Family History  Problem Relation Age of Onset  .  COPD Mother   . Heart attack Father   . Hypertension Father   . Leukemia Brother   . Kidney failure Maternal Grandfather   . COPD Paternal Grandmother     Allergies  Allergen Reactions  . Metoclopramide Hcl     REACTION: Nervousness  . Penicillins     REACTION: Upsets stomach    Medication list reviewed and updated in full in [place].   GEN: No acute illnesses, no fevers, chills. GI: as above Pulm: No SOB Interactive and getting along well at home.  Otherwise, ROS is as per the HPI.  Objective:   Pulse 90    Temp 98.4 F (36.9 C) (Oral)   Ht 5\' 5"  (1.651 m)   Wt 175 lb (79.4 kg)   SpO2 95%   BMI 29.12 kg/m   GEN: WDWN, NAD, Non-toxic, A & O x 3 HEENT: Atraumatic, Normocephalic. Neck supple. No masses, No LAD. Ears and Nose: No external deformity. CV: RRR, No M/G/R. No JVD. No thrill. No extra heart sounds. PULM: CTA B, no wheezes, crackles, rhonchi. No retractions. No resp. distress. No accessory muscle use. ABD: S, mildly tender throughout, ND, + BS, No rebound, No HSM  EXTR: No c/c/e NEURO Normal gait.  PSYCH: Normally interactive. Conversant. Not depressed or anxious appearing.  Calm demeanor.   Laboratory and Imaging Data:  Assessment and Plan:   Heart palpitations - Plan: EKG 12-Lead, Ambulatory referral to Cardiology, Basic metabolic panel, CBC with Differential/Platelet, Hepatic function panel, TSH, T3, free, T4, free  Atypical chest pain - Plan: Ambulatory referral to Cardiology  Generalized abdominal pain - Plan: US Abdomen Complete  Diarrhea, unspecified type  Smoker  Other fatigue  Hyperlipidemia LDL goal <70 - Plan: Lipid panel  Hypoglycemia - Plan: Hemoglobin A1c  EKG: Normal sinus rhythm. Normal axis, normal R wave progression, No acute ST elevation or depression.   Abdominal pain and issues. Check basic laboratories. Adhesions possible. Exam not consistent with acute abdomen. Given length of time, check abdominal ultrasound.  Palpitations lasting 1-2 minutes or longer, I think it is reasonable to have further assessment on this. She is clear that this is very different from a traditional panic attack that she has had in the past.  Follow-up: with PCP per regular appt  Future Appointments Date Time Provider [place]  02/01/2017 11:00 AM [facility]  04/08/2017 4:00 PM Gollan, Kathlene November, MD [facility]    Meds ordered this encounter  Medications  . ALPRAZolam (XANAX) 0.5 MG tablet    Sig: Take 1 tablet (0.5 mg total) by  mouth 2 (two) times daily as needed for anxiety.    Dispense:  30 tablet    Refill:  0   Medications Discontinued During This Encounter  Medication Reason  . ALPRAZolam (XANAX) 0.5 MG tablet Reorder   Orders Placed This Encounter  Procedures  . US Abdomen Complete  . Basic metabolic panel  . CBC with Differential/Platelet  . Hepatic function panel  . Lipid panel  . TSH  . T3, free  . T4, free  . Hemoglobin A1c  . Ambulatory referral to Cardiology  . EKG 12-Lead    Signed,  Diamonds Lippard T. Morine Kohlman, MD   Allergies as of 01/26/2017      Reactions   Metoclopramide Hcl    REACTION: Nervousness   Penicillins    REACTION: Upsets stomach      Medication List       Accurate as of 01/26/17  3:47  PM. Always use your most recent med list.          ALPRAZolam 0.5 MG tablet Commonly known as:  XANAX Take 1 tablet (0.5 mg total) by mouth 2 (two) times daily as needed for anxiety.   aspirin 81 MG tablet Take 81 mg by mouth daily.   calcium carbonate 500 MG chewable tablet Commonly known as:  TUMS - dosed in mg elemental calcium Chew 6 tablets by mouth daily as needed for indigestion or heartburn.   estradiol 0.05 MG/24HR patch Commonly known as:  VIVELLE-DOT Place 1 patch onto the skin 2 (two) times a week. Tuesday and Friday   omeprazole 20 MG capsule Commonly known as:  PRILOSEC Take 1 capsule (20 mg total) by mouth daily.

## 2017-01-26 NOTE — Telephone Encounter (Signed)
New patient referral for frequent ed visits palpitations   Patient wants to be seen sooner than 8/30appt with Gollan  Added to waitlist

## 2017-01-26 NOTE — Telephone Encounter (Signed)
Routing to patient's PCP, Dr Lorelei Pont.

## 2017-01-26 NOTE — Telephone Encounter (Signed)
Routing to all providers to advise on who can see patient for an earlier appointment.

## 2017-01-26 NOTE — Telephone Encounter (Signed)
Perhaps PCP could order a cardiac monitor for review prior to her appointment. Appears this has been going on for a couple of years. TSH CBC, and bmet pending. Consider magnesium add-on.

## 2017-01-27 ENCOUNTER — Other Ambulatory Visit (INDEPENDENT_AMBULATORY_CARE_PROVIDER_SITE_OTHER): Payer: 59

## 2017-01-27 DIAGNOSIS — R002 Palpitations: Secondary | ICD-10-CM

## 2017-01-27 LAB — MAGNESIUM: Magnesium: 1.9 mg/dL (ref 1.5–2.5)

## 2017-01-27 NOTE — Telephone Encounter (Signed)
New patient to me today. I will add a 48 hour holter monitor at Cardiology's request.

## 2017-01-27 NOTE — Addendum Note (Signed)
Addended by: Owens Loffler on: 01/27/2017 09:46 AM   Modules accepted: Orders

## 2017-01-28 ENCOUNTER — Telehealth: Payer: Self-pay | Admitting: Family Medicine

## 2017-01-28 NOTE — Telephone Encounter (Signed)
Pt returned your call.  

## 2017-01-28 NOTE — Telephone Encounter (Signed)
Rebecca Allen notified that she is on a work-in list to get an earlier appointment with cardiology - they will call her when someone cancels an appointment.

## 2017-01-29 ENCOUNTER — Telehealth: Payer: Self-pay

## 2017-01-29 ENCOUNTER — Encounter: Payer: 59 | Admitting: Family Medicine

## 2017-01-29 NOTE — Telephone Encounter (Signed)
Attempted to schedule new patient appt from waitist no ans no vm

## 2017-02-01 ENCOUNTER — Ambulatory Visit
Admission: RE | Admit: 2017-02-01 | Discharge: 2017-02-01 | Disposition: A | Discharge status: Home / Self Care | Payer: 59 | Source: Ambulatory Visit | Attending: Family Medicine | Admitting: Family Medicine

## 2017-02-01 DIAGNOSIS — R1084 Generalized abdominal pain: Secondary | ICD-10-CM | POA: Diagnosis not present

## 2017-02-01 DIAGNOSIS — R109 Unspecified abdominal pain: Secondary | ICD-10-CM | POA: Diagnosis not present

## 2017-02-04 ENCOUNTER — Ambulatory Visit (INDEPENDENT_AMBULATORY_CARE_PROVIDER_SITE_OTHER): Payer: 59

## 2017-02-04 ENCOUNTER — Ambulatory Visit: Payer: 59 | Admitting: Psychology

## 2017-02-04 DIAGNOSIS — R002 Palpitations: Secondary | ICD-10-CM

## 2017-02-09 ENCOUNTER — Telehealth: Payer: Self-pay | Admitting: Cardiovascular Disease

## 2017-02-09 NOTE — Telephone Encounter (Signed)
Offered pt 7/5, 8:40am with Dr. Fletcher Anon. Pt states she works and is unable to come

## 2017-02-11 ENCOUNTER — Ambulatory Visit
Admission: RE | Admit: 2017-02-11 | Discharge: 2017-02-11 | Disposition: A | Discharge status: Home / Self Care | Payer: 59 | Source: Ambulatory Visit | Attending: Cardiovascular Disease | Admitting: Cardiovascular Disease

## 2017-02-11 DIAGNOSIS — R002 Palpitations: Secondary | ICD-10-CM | POA: Insufficient documentation

## 2017-02-18 ENCOUNTER — Ambulatory Visit: Payer: Self-pay | Admitting: Psychology

## 2017-02-18 ENCOUNTER — Telehealth: Payer: Self-pay | Admitting: Cardiovascular Disease

## 2017-02-18 NOTE — Telephone Encounter (Signed)
Attempted to schedule sooner appt from waitlist no ans no vm

## 2017-03-04 ENCOUNTER — Ambulatory Visit: Payer: Self-pay | Admitting: Psychology

## 2017-03-18 ENCOUNTER — Ambulatory Visit: Payer: 59 | Admitting: Psychology

## 2017-04-08 ENCOUNTER — Ambulatory Visit: Payer: 59 | Admitting: Cardiovascular Disease

## 2017-05-01 ENCOUNTER — Other Ambulatory Visit: Payer: Self-pay | Admitting: Certified Nurse Midwife

## 2017-05-03 DIAGNOSIS — R079 Chest pain, unspecified: Secondary | ICD-10-CM | POA: Diagnosis not present

## 2017-05-03 DIAGNOSIS — R42 Dizziness and giddiness: Secondary | ICD-10-CM | POA: Diagnosis not present

## 2017-05-03 DIAGNOSIS — M546 Pain in thoracic spine: Secondary | ICD-10-CM | POA: Diagnosis not present

## 2017-05-03 DIAGNOSIS — R0789 Other chest pain: Secondary | ICD-10-CM | POA: Diagnosis not present

## 2017-05-03 NOTE — Telephone Encounter (Signed)
Please advise for refill. Thank you.  

## 2017-05-25 ENCOUNTER — Ambulatory Visit (INDEPENDENT_AMBULATORY_CARE_PROVIDER_SITE_OTHER): Payer: 59 | Admitting: Internal Medicine

## 2017-05-25 ENCOUNTER — Encounter: Payer: Self-pay | Admitting: Internal Medicine

## 2017-05-25 VITALS — BP 100/68 | HR 74 | Ht 65.5 in | Wt 174.0 lb

## 2017-05-25 DIAGNOSIS — R002 Palpitations: Secondary | ICD-10-CM | POA: Diagnosis not present

## 2017-05-25 DIAGNOSIS — R0789 Other chest pain: Secondary | ICD-10-CM

## 2017-05-25 DIAGNOSIS — E782 Mixed hyperlipidemia: Secondary | ICD-10-CM

## 2017-05-25 NOTE — Progress Notes (Signed)
New Outpatient Visit Date: 05/25/2017  Referring Provider: Owens Loffler, MD [place], [place] [address]  Chief Complaint: Chest pain  HPI:  [name] is a 51 y.o. female who is being seen today for the evaluation of chest pain and palpitations at the request of Copland, Spencer, MD. She has a history of GERD, hyperlipidemia, depression, and anxiety. [name] reports a long history of intermittent chest pain dating back several years. She describes a sharp, stabbing pain going through the left side of her chest to her left shoulder blade and occasionally radiating to the left shoulder and neck. The pain can last hours at a time and has a maximal intensity of 10/10. There are no clear precipitants. She notes accompanying shortness of breath, dizziness, and clamminess. During her most recent episode of chest pain, the patient summoned EMS due to severe pain. She received Toradol with modest improvement. Ultimately, the pain resolved on its own over the course of several hours. No etiology was identified. [name] feels as though her pain is different than previous panic attacks, though she has noted some improvement in the chest discomfort after taking alprazolam.  [name] has also had palpitations in the past, though these seem to have abated over the last few months. The palpitations did not coincide temporally with her chest pain. She has chronic orthopnea and no PND. She notes occasional leg edema.  Previous cardiac workup for this same sort of chest pain in 2012 includes a negative exercise stress echocardiogram. Holter monitor this summer to further evaluate the aforementioned palpitations was negative except for occasional PACs.  --------------------------------------------------------------------------------------------------  Cardiovascular History & Procedures: Cardiovascular Problems:  Palpitations  Risk Factors:  Hyperlipidemia and  family history  Cath/PCI:  None  CV Surgery:  None  EP Procedures and Devices:  48-hour Holter monitor (02/04/17): Sinus rhythm with rare PACs. No significant arrhythmias.  Non-Invasive Evaluation(s):  Exercise stress echocardiogram (12/18/10): Normal treadmill EKG without evidence of ischemia. Excellent exercise tolerance. Normal stress echocardiogram images without evidence of ischemia.  Recent CV Pertinent Labs: Lab Results  Component Value Date   CHOL 268 (H) 01/26/2017   HDL 58.50 01/26/2017   LDLCALC 171 (H) 01/26/2017   LDLDIRECT 134.7 10/03/2010   TRIG 192.0 (H) 01/26/2017   CHOLHDL 5 01/26/2017   K 4.1 01/26/2017   K 4.0 04/12/2014   MG 1.9 01/27/2017   BUN 17 01/26/2017   BUN 21 (H) 04/12/2014   CREATININE 0.82 01/26/2017   CREATININE 0.87 04/12/2014    --------------------------------------------------------------------------------------------------  Past Medical History:  Diagnosis Date  . Depression   . GERD (gastroesophageal reflux disease)   . Hyperlipidemia   . Treadmill stress test negative for angina pectoris 12/18/2010    Past Surgical History:  Procedure Laterality Date  . ABDOMINAL HYSTERECTOMY    . CHOLECYSTECTOMY    . PARTIAL HYSTERECTOMY  08-1999   abdominal    Current Meds  Medication Sig  . ALPRAZolam (XANAX) 0.5 MG tablet Take 1 tablet (0.5 mg total) by mouth 2 (two) times daily as needed for anxiety.  . calcium carbonate (TUMS - DOSED IN MG ELEMENTAL CALCIUM) 500 MG chewable tablet Chew 6 tablets by mouth daily as needed for indigestion or heartburn.  . Coenzyme Q10 (CO Q-10) 200 MG CAPS Take 200 mg by mouth daily.  Marland Kitchen estradiol (VIVELLE-DOT) 0.05 MG/24HR patch PLACE 1 PATCH TRANSDERMALLY TWICE A WEEK AND REMOVE OLD PATCH BEFORE APPLYING NEW PATCH  . KRILL OIL OMEGA-3 PO Take  by mouth daily.  . Magnesium Oxide 400 MG CAPS Take 400 mg by mouth daily.    Allergies: Metoclopramide hcl; Morphine and related; and Penicillins  Social  History   Social History  . Marital status: Married    Spouse name: N/A  . Number of children: N/A  . Years of education: N/A   Occupational History  . Certified Medical Assistant [employer]    [facility] [place] Topics  . Smoking status: Current Every Day Smoker    Packs/day: 0.25    Years: 30.00    Types: Cigarettes  . Smokeless tobacco: Never Used  . Alcohol use 1.2 oz/week    1 Glasses of wine, 1 Cans of beer per week     Comment: beer ocassionally  . Drug use: No  . Sexual activity: Not on file   Other Topics Concern  . Not on file   Social History Narrative   Regular exercise- yes, occ walking   Diet- fruits and veggies, water     Family History  Problem Relation Age of Onset  . COPD Mother   . Heart attack Father 25  . Hypertension Father   . Heart disease Father        stent placement   . Leukemia Brother   . Kidney failure Maternal Grandfather   . COPD Paternal Grandmother   . Congenital heart disease Sister     Review of Systems: A 12-system review of systems was performed and was negative except as noted in the HPI.  --------------------------------------------------------------------------------------------------  Physical Exam: BP 100/68 (BP Location: Right Arm, Patient Position: Sitting, Cuff Size: Normal)   Pulse 74   Ht 5' 5.5" (1.664 m)   Wt 174 lb (78.9 kg)   BMI 28.51 kg/m   General:  Overweight woman, seated comfortably in the exam room. HEENT: No conjunctival pallor or scleral icterus. Moist mucous membranes. OP clear. Neck: Supple without lymphadenopathy, thyromegaly, JVD, or HJR. No carotid bruit. Lungs: Normal work of breathing. Clear to auscultation bilaterally without wheezes or crackles. Heart: Regular rate and rhythm without murmurs, rubs, or gallops. Non-displaced PMI. Abd: Bowel sounds present. Soft, NT/ND without hepatosplenomegaly Ext: No lower extremity edema. Radial, PT, and DP pulses are 2+  bilaterally Skin: Warm and dry without rash. Neuro: CNIII-XII intact. Strength and fine-touch sensation intact in upper and lower extremities bilaterally. Psych: Normal mood and affect.  EKG:  Normal sinus rhythm with low voltage. Otherwise, no significant abnormalities.  Lab Results  Component Value Date   WBC 7.0 01/26/2017   HGB 15.0 01/26/2017   HCT 44.4 01/26/2017   MCV 93.2 01/26/2017   PLT 265.0 01/26/2017    Lab Results  Component Value Date   NA 139 01/26/2017   K 4.1 01/26/2017   CL 104 01/26/2017   CO2 27 01/26/2017   BUN 17 01/26/2017   CREATININE 0.82 01/26/2017   GLUCOSE 90 01/26/2017   ALT 39 (H) 01/26/2017    Lab Results  Component Value Date   CHOL 268 (H) 01/26/2017   HDL 58.50 01/26/2017   LDLCALC 171 (H) 01/26/2017   LDLDIRECT 134.7 10/03/2010   TRIG 192.0 (H) 01/26/2017   CHOLHDL 5 01/26/2017    --------------------------------------------------------------------------------------------------  ASSESSMENT AND PLAN: Atypical chest pain Chest pain has been a recurrent problem for many years and is atypical in nature. Cardiac risk factors include hyperlipidemia and family history of premature atherosclerotic cardiovascular disease. We discussed further evaluation options and have agreed to perform  a coronary CTA to evaluate for significant CAD as well as to assess her coronary artery calcium burden to provide additional risk stratification. If the CTA is unrevealing, Ms. Kydd will need to follow-up with her PCP to evaluate for other causes of her chest, left shoulder, and neck pain.  Hyperlipidemia Most recent LDL in June was 171. Ms. Mickiewicz is in the process of trying to lose weight and change her diet. We will defer discussion of statin therapy until after her CT. If there is evidence of CAD (including coronary artery calcium), I will recommend adding at least moderate-intensity statin therapy.  Palpitations PACs noted on prior Holter  monitor. Symptoms have actually improved considerably over the last few months. No further evaluation at this time.  Follow-up: Return to clinic in 3 months.  Nelva Bush, MD 05/25/2017 9:38 PM

## 2017-05-25 NOTE — Patient Instructions (Signed)
Medication Instructions:  Your physician recommends that you continue on your current medications as directed. Please refer to the Current Medication list given to you today.   Labwork: Your physician recommends that you return for lab work WITHIN 30 DAYS OF CORONARY CTA. (BMP)   Testing/Procedures: CORONARY CT Angiography (CTA), is a special type of CT scan that uses a computer to produce multi-dimensional views of major blood vessels throughout the body. In CT angiography, a contrast material is injected through an IV to help visualize the blood vessels   Follow-Up: Your physician recommends that you schedule a follow-up appointment in: 3 MONTHS WITH DR END.   If you need a refill on your cardiac medications before your next appointment, please call your pharmacy.

## 2017-05-26 ENCOUNTER — Telehealth: Payer: Self-pay | Admitting: Family Medicine

## 2017-05-26 NOTE — Telephone Encounter (Signed)
Opened in error, (pt called for early labs but changes mind)

## 2017-05-27 ENCOUNTER — Ambulatory Visit: Payer: 59 | Admitting: Family Medicine

## 2017-05-27 ENCOUNTER — Other Ambulatory Visit: Payer: Self-pay | Admitting: Family Medicine

## 2017-05-27 ENCOUNTER — Other Ambulatory Visit (INDEPENDENT_AMBULATORY_CARE_PROVIDER_SITE_OTHER): Payer: 59

## 2017-05-27 DIAGNOSIS — E782 Mixed hyperlipidemia: Secondary | ICD-10-CM

## 2017-05-27 DIAGNOSIS — R0789 Other chest pain: Secondary | ICD-10-CM

## 2017-05-28 LAB — LIPID PANEL
CHOL/HDL RATIO: 5
Cholesterol: 236 mg/dL — ABNORMAL HIGH (ref 0–200)
HDL: 47.6 mg/dL (ref >39.00)
NONHDL: 188.42
Triglycerides: 283 mg/dL — ABNORMAL HIGH (ref 0.0–149.0)
VLDL: 56.6 mg/dL — ABNORMAL HIGH (ref 0.0–40.0)

## 2017-05-28 LAB — CBC WITH DIFFERENTIAL/PLATELET
BASOS ABS: 0.1 10*3/uL (ref 0.0–0.1)
Basophils Relative: 1.2 % (ref 0.0–3.0)
Eosinophils Absolute: 0.1 10*3/uL (ref 0.0–0.7)
Eosinophils Relative: 1.5 % (ref 0.0–5.0)
HEMATOCRIT: 43.5 % (ref 36.0–46.0)
Hemoglobin: 14.4 g/dL (ref 12.0–15.0)
LYMPHS ABS: 2.3 10*3/uL (ref 0.7–4.0)
LYMPHS PCT: 31 % (ref 12.0–46.0)
MCHC: 33.2 g/dL (ref 30.0–36.0)
MCV: 95 fl (ref 78.0–100.0)
MONOS PCT: 6.7 % (ref 3.0–12.0)
Monocytes Absolute: 0.5 10*3/uL (ref 0.1–1.0)
NEUTROS PCT: 59.6 % (ref 43.0–77.0)
Neutro Abs: 4.4 10*3/uL (ref 1.4–7.7)
Platelets: 257 10*3/uL (ref 150.0–400.0)
RBC: 4.58 Mil/uL (ref 3.87–5.11)
RDW: 12.9 % (ref 11.5–15.5)
WBC: 7.4 10*3/uL (ref 4.0–10.5)

## 2017-05-28 LAB — BASIC METABOLIC PANEL
BUN: 21 mg/dL (ref 6–23)
CHLORIDE: 101 meq/L (ref 96–112)
CO2: 26 meq/L (ref 19–32)
CREATININE: 0.87 mg/dL (ref 0.40–1.20)
Calcium: 9.8 mg/dL (ref 8.4–10.5)
GFR: 73.38 mL/min (ref >60.00)
Glucose, Bld: 87 mg/dL (ref 70–99)
Potassium: 4.4 mEq/L (ref 3.5–5.1)
Sodium: 137 mEq/L (ref 135–145)

## 2017-05-28 LAB — LDL CHOLESTEROL, DIRECT: Direct LDL: 150 mg/dL

## 2017-06-04 ENCOUNTER — Ambulatory Visit: Payer: 59 | Admitting: Family Medicine

## 2017-06-08 ENCOUNTER — Ambulatory Visit (INDEPENDENT_AMBULATORY_CARE_PROVIDER_SITE_OTHER): Payer: 59 | Admitting: Family Medicine

## 2017-06-08 ENCOUNTER — Encounter: Payer: Self-pay | Admitting: Family Medicine

## 2017-06-08 VITALS — BP 90/60 | HR 79 | Temp 98.0°F | Ht 65.0 in | Wt 175.3 lb

## 2017-06-08 DIAGNOSIS — R0789 Other chest pain: Secondary | ICD-10-CM | POA: Diagnosis not present

## 2017-06-08 DIAGNOSIS — M5412 Radiculopathy, cervical region: Secondary | ICD-10-CM | POA: Diagnosis not present

## 2017-06-08 DIAGNOSIS — M542 Cervicalgia: Secondary | ICD-10-CM

## 2017-06-08 MED ORDER — OMEPRAZOLE 20 MG PO CPDR: 20.0000 mg | DELAYED_RELEASE_CAPSULE | Freq: Every day | ORAL | 11 refills | Status: DC

## 2017-06-08 MED ORDER — ALPRAZOLAM 0.5 MG PO TABS: 0.5000 mg | ORAL_TABLET | Freq: Two times a day (BID) | ORAL | 0 refills | Status: DC | PRN

## 2017-06-08 MED ORDER — BUPROPION HCL ER (XL) 150 MG PO TB24: 150.0000 mg | ORAL_TABLET | Freq: Every day | ORAL | 1 refills | Status: DC

## 2017-06-08 NOTE — Patient Instructions (Addendum)
Please stop at the front desk to set up referral. Increase water, start regular stretching of legs. Quit smoking to decreased risk of heart disease!

## 2017-06-08 NOTE — Progress Notes (Signed)
Subjective:    Patient ID: Rebecca Allen, female    DOB: 1966/05/26, 50 y.o.   MRN: [id number]  HPI    51 year old female pt with history of recurrent chronic chest pain, panic attacks, GAD palpitations presents following  ER visit for Chest Pain in  Early October at [facility].  Had sudden onset dizziness, clammy feeling and central chest pain.  Took an aspirin.  EMS came to take her to ER.  Toradol helped pain for a few hours.. Pain recurred.   She saw Dr. Saunders Revel on 05/25/2017 for atypical chest pain  At that time he recommended coronary CTA.  She is not interested in this.  Previous cardiac workup for this same sort of chest pain in 2012 included a negative exercise stress echocardiogram. Holter monitor  summer  2018 to further evaluate  aforementioned palpitations was negative except for occasional PACs.  Family history of early CAD in father.    She has daily pain in left  Lateral neck and upper shoulder.. Radiates to left upper chest x 2 years, but worse in last few months,  worse with excessive movement, lifting a lot.  tingling in left fingers off and on, 3-5th digits.  Tylenol Help minimmaly, heat  Muscle relaxant justs makes her sleepy does not help with pain.  She has tried NSAID > 6 weeks without improvement  Neg plain films of neck and left shoulder in 2016  She does have leg cramps severe.   She has episodes of dizziness associate with the chest pain  Blood pressure 90/60, pulse 79, temperature 98 F (36.7 C), temperature source Oral, height 5\' 5"  (1.651 m), weight 175 lb 5 oz (79.5 kg).   Review of Systems  Constitutional: Negative for fatigue.  HENT: Negative for ear pain.   Eyes: Negative for pain.  Respiratory: Negative for shortness of breath.   Cardiovascular: Positive for chest pain. Negative for palpitations and leg swelling.  Gastrointestinal: Negative for abdominal pain.       Objective:   Physical Exam  Constitutional: She is  oriented to person, place, and time. Vital signs are normal. She appears well-developed and well-nourished. She is cooperative.  Non-toxic appearance. She does not appear ill. No distress.  HENT:  Head: Normocephalic.  Right Ear: Hearing, tympanic membrane, external ear and ear canal normal. Tympanic membrane is not erythematous, not retracted and not bulging.  Left Ear: Hearing, tympanic membrane, external ear and ear canal normal. Tympanic membrane is not erythematous, not retracted and not bulging.  Nose: No mucosal edema or rhinorrhea. Right sinus exhibits no maxillary sinus tenderness and no frontal sinus tenderness. Left sinus exhibits no maxillary sinus tenderness and no frontal sinus tenderness.  Mouth/Throat: Uvula is midline, oropharynx is clear and moist and mucous membranes are normal.  Eyes: Pupils are equal, round, and reactive to light. Conjunctivae, EOM and lids are normal. Lids are everted and swept, no foreign bodies found.  Neck: Trachea normal and normal range of motion. Neck supple. Carotid bruit is not present. No thyroid mass and no thyromegaly present.  Cardiovascular: Normal rate, regular rhythm, S1 normal, S2 normal, normal heart sounds, intact distal pulses and normal pulses.  Exam reveals no gallop and no friction rub.   No murmur heard. Pulmonary/Chest: Effort normal and breath sounds normal. No tachypnea. No respiratory distress. She has no decreased breath sounds. She has no wheezes. She has no rhonchi. She has no rales.  Abdominal: Soft. Normal appearance and bowel sounds are  normal. There is no tenderness.  Musculoskeletal:       Cervical back: She exhibits decreased range of motion and tenderness. She exhibits no bony tenderness.  ttp in left  Lateral neck and trapezius   POSITIVE spurling test on left, neg on right  no atrophy  In left arm/hand   Neurological: She is alert and oriented to person, place, and time. She has normal strength and normal reflexes. She  displays no atrophy. A sensory deficit is present. No cranial nerve deficit. She exhibits normal muscle tone. She displays a negative Romberg sign. Coordination and gait normal. GCS eye subscore is 4. GCS verbal subscore is 5. GCS motor subscore is 6.  Tingling in left hand , 3, 4, 5th digits  Skin: Skin is warm, dry and intact. No rash noted.  Psychiatric: She has a normal mood and affect. Her speech is normal and behavior is normal. Judgment and thought content normal. Her mood appears not anxious. Cognition and memory are normal. Cognition and memory are not impaired. She does not exhibit a depressed mood. She exhibits normal recent memory and normal remote memory.          Assessment & Plan:

## 2017-06-11 ENCOUNTER — Encounter: Payer: Self-pay | Admitting: Internal Medicine

## 2017-06-11 NOTE — Assessment & Plan Note (Addendum)
Worsened symptoms in last few months.. Now with positive spurling on right and sensation cahnge in left hand.  No red flags but worrisome for herniated disc.  Not improved with ice/heat, home stretching, NSAIDs > 6 weeks, muscle relaxants.  Reviewed X-ray done 2016. No recent fall and doubt fracture. Will eval with MRI to assess vertebral discs and determine if there is ongoing nerve compression causing her radiculopathy. She would like be a good candidate for steroid injection.

## 2017-06-11 NOTE — Assessment & Plan Note (Addendum)
Encouraged pt to consider coronary CT to decrease concern about cardiac source of chest pain.  Otherwise work up negative.  I still feel pain is likely due to GAD and MSK changes.

## 2017-06-15 ENCOUNTER — Ambulatory Visit
Admission: RE | Admit: 2017-06-15 | Discharge: 2017-06-15 | Disposition: A | Discharge status: Home / Self Care | Payer: 59 | Source: Ambulatory Visit | Attending: Family Medicine | Admitting: Family Medicine

## 2017-06-15 DIAGNOSIS — M47812 Spondylosis without myelopathy or radiculopathy, cervical region: Secondary | ICD-10-CM | POA: Insufficient documentation

## 2017-06-15 DIAGNOSIS — M2578 Osteophyte, vertebrae: Secondary | ICD-10-CM | POA: Diagnosis not present

## 2017-06-15 DIAGNOSIS — M542 Cervicalgia: Secondary | ICD-10-CM | POA: Diagnosis not present

## 2017-07-21 ENCOUNTER — Other Ambulatory Visit: Payer: Self-pay | Admitting: Certified Nurse Midwife

## 2017-07-21 ENCOUNTER — Telehealth: Payer: Self-pay

## 2017-07-21 MED ORDER — ESTRADIOL 0.05 MG/24HR TD PTTW: MEDICATED_PATCH | TRANSDERMAL | 0 refills | Status: DC

## 2017-07-21 NOTE — Telephone Encounter (Signed)
Pt has scheduled her AE for 08/06/17 w/CLG. She needs a rf on her Vivelle dot sent to [facility].

## 2017-07-21 NOTE — Telephone Encounter (Signed)
Vivelle Dot 1 refill sent to [facility]. Pt aware.

## 2017-08-06 ENCOUNTER — Ambulatory Visit: Payer: Self-pay | Admitting: Certified Nurse Midwife

## 2017-08-10 HISTORY — PX: UPPER GI ENDOSCOPY: SHX6162

## 2017-08-23 ENCOUNTER — Other Ambulatory Visit: Payer: Self-pay | Admitting: Certified Nurse Midwife

## 2017-08-25 ENCOUNTER — Ambulatory Visit: Payer: Self-pay | Admitting: Internal Medicine

## 2017-08-26 ENCOUNTER — Ambulatory Visit (INDEPENDENT_AMBULATORY_CARE_PROVIDER_SITE_OTHER): Payer: 59 | Admitting: Advanced Practice Midwife

## 2017-08-26 ENCOUNTER — Encounter: Payer: Self-pay | Admitting: Advanced Practice Midwife

## 2017-08-26 VITALS — BP 118/70 | Ht 65.0 in | Wt 178.0 lb

## 2017-08-26 DIAGNOSIS — E8941 Symptomatic postprocedural ovarian failure: Secondary | ICD-10-CM | POA: Diagnosis not present

## 2017-08-26 DIAGNOSIS — Z0000 Encounter for general adult medical examination without abnormal findings: Secondary | ICD-10-CM | POA: Diagnosis not present

## 2017-08-26 DIAGNOSIS — Z01419 Encounter for gynecological examination (general) (routine) without abnormal findings: Secondary | ICD-10-CM | POA: Diagnosis not present

## 2017-08-26 MED ORDER — ESTRADIOL 0.05 MG/24HR TD PTTW: MEDICATED_PATCH | TRANSDERMAL | 11 refills | Status: DC

## 2017-08-26 NOTE — Patient Instructions (Signed)
Colonoscopy, Adult A colonoscopy is an exam to look at the entire large intestine. During the exam, a lubricated, bendable tube is inserted into the anus and then passed into the rectum, colon, and other parts of the large intestine. A colonoscopy is often done as a part of normal colorectal screening or in response to certain symptoms, such as anemia, persistent diarrhea, abdominal pain, and blood in the stool. The exam can help screen for and diagnose medical problems, including:  Tumors.  Polyps.  Inflammation.  Areas of bleeding.  Tell a health care provider about:  Any allergies you have.  All medicines you are taking, including vitamins, herbs, eye drops, creams, and over-the-counter medicines.  Any problems you or family members have had with anesthetic medicines.  Any blood disorders you have.  Any surgeries you have had.  Any medical conditions you have.  Any problems you have had passing stool. What are the risks? Generally, this is a safe procedure. However, problems may occur, including:  Bleeding.  A tear in the intestine.  A reaction to medicines given during the exam.  Infection (rare).  What happens before the procedure? Eating and drinking restrictions Follow instructions from your health care provider about eating and drinking, which may include:  A few days before the procedure - follow a low-fiber diet. Avoid nuts, seeds, dried fruit, raw fruits, and vegetables.  1-3 days before the procedure - follow a clear liquid diet. Drink only clear liquids, such as clear broth or bouillon, black coffee or tea, clear juice, clear soft drinks or sports drinks, gelatin dessert, and popsicles. Avoid any liquids that contain red or purple dye.  On the day of the procedure - do not eat or drink anything during the 2 hours before the procedure, or within the time period that your health care provider recommends.  Bowel prep If you were prescribed an oral bowel  prep to clean out your colon:  Take it as told by your health care provider. Starting the day before your procedure, you will need to drink a large amount of medicated liquid. The liquid will cause you to have multiple loose stools until your stool is almost clear or light green.  If your skin or anus gets irritated from diarrhea, you may use these to relieve the irritation: ? Medicated wipes, such as adult wet wipes with aloe and vitamin E. ? A skin soothing-product like petroleum jelly.  If you vomit while drinking the bowel prep, take a break for up to 60 minutes and then begin the bowel prep again. If vomiting continues and you cannot take the bowel prep without vomiting, call your health care provider.  General instructions  Ask your health care provider about changing or stopping your regular medicines. This is especially important if you are taking diabetes medicines or blood thinners.  Plan to have someone take you home from the hospital or clinic. What happens during the procedure?  An IV tube may be inserted into one of your veins.  You will be given medicine to help you relax (sedative).  To reduce your risk of infection: ? Your health care team will wash or sanitize their hands. ? Your anal area will be washed with soap.  You will be asked to lie on your side with your knees bent.  Your health care provider will lubricate a long, thin, flexible tube. The tube will have a camera and a light on the end.  The tube will be inserted into  your anus.  The tube will be gently eased through your rectum and colon.  Air will be delivered into your colon to keep it open. You may feel some pressure or cramping.  The camera will be used to take images during the procedure.  A small tissue sample may be removed from your body to be examined under a microscope (biopsy). If any potential problems are found, the tissue will be sent to a lab for testing.  If small polyps are found,  your health care provider may remove them and have them checked for cancer cells.  The tube that was inserted into your anus will be slowly removed. The procedure may vary among health care providers and hospitals. What happens after the procedure?  Your blood pressure, heart rate, breathing rate, and blood oxygen level will be monitored until the medicines you were given have worn off.  Do not drive for 24 hours after the exam.  You may have a small amount of blood in your stool.  You may pass gas and have mild abdominal cramping or bloating due to the air that was used to inflate your colon during the exam.  It is up to you to get the results of your procedure. Ask your health care provider, or the department performing the procedure, when your results will be ready. This information is not intended to replace advice given to you by your health care provider. Make sure you discuss any questions you have with your health care provider. Document Released: 07/24/2000 Document Revised: 05/27/2016 Document Reviewed: 10/08/2015 Elsevier Interactive Patient Education  2018 [employer].     Why follow it? Research shows. . Those who follow the Mediterranean diet have a reduced risk of heart disease  . The diet is associated with a reduced incidence of Parkinson's and Alzheimer's diseases . People following the diet may have longer life expectancies and lower rates of chronic diseases  . The Dietary Guidelines for Americans recommends the Mediterranean diet as an eating plan to promote health and prevent disease  What Is the Mediterranean Diet?  . Healthy eating plan based on typical foods and recipes of Mediterranean-style cooking . The diet is primarily a plant based diet; these foods should make up a majority of meals   Starches - Plant based foods should make up a majority of meals - They are an important sources of vitamins, minerals, energy, antioxidants, and fiber - Choose whole  grains, foods high in fiber and minimally processed items  - Typical grain sources include wheat, oats, barley, corn, brown rice, bulgar, farro, millet, polenta, couscous  - Various types of beans include chickpeas, lentils, fava beans, black beans, white beans   Fruits  Veggies - Large quantities of antioxidant rich fruits & veggies; 6 or more servings  - Vegetables can be eaten raw or lightly drizzled with oil and cooked  - Vegetables common to the traditional Mediterranean Diet include: artichokes, arugula, beets, broccoli, brussel sprouts, cabbage, carrots, celery, collard greens, cucumbers, eggplant, kale, leeks, lemons, lettuce, mushrooms, okra, onions, peas, peppers, potatoes, pumpkin, radishes, rutabaga, shallots, spinach, sweet potatoes, turnips, zucchini - Fruits common to the Mediterranean Diet include: apples, apricots, avocados, cherries, clementines, dates, figs, grapefruits, grapes, melons, nectarines, oranges, peaches, pears, pomegranates, strawberries, tangerines  Fats - Replace butter and margarine with healthy oils, such as olive oil, canola oil, and tahini  - Limit nuts to no more than a handful a day  - Nuts include walnuts, almonds, pecans, pistachios, pine nuts  -  Limit or avoid candied, honey roasted or heavily salted nuts - Olives are central to the Mediterranean diet - can be eaten whole or used in a variety of dishes   Meats Protein - Limiting red meat: no more than a few times a month - When eating red meat: choose lean cuts and keep the portion to the size of deck of cards - Eggs: approx. 0 to 4 times a week  - Fish and lean poultry: at least 2 a week  - Healthy protein sources include, chicken, [place], lean beef, lamb - Increase intake of seafood such as tuna, salmon, trout, mackerel, shrimp, scallops - Avoid or limit high fat processed meats such as sausage and bacon  Dairy - Include moderate amounts of low fat dairy products  - Focus on healthy dairy such as fat  free yogurt, skim milk, low or reduced fat cheese - Limit dairy products higher in fat such as whole or 2% milk, cheese, ice cream  Alcohol - Moderate amounts of red wine is ok  - No more than 5 oz daily for women (all ages) and men older than age 47  - No more than 10 oz of wine daily for men younger than 32  Other - Limit sweets and other desserts  - Use herbs and spices instead of salt to flavor foods  - Herbs and spices common to the traditional Mediterranean Diet include: basil, bay leaves, chives, cloves, cumin, fennel, garlic, lavender, marjoram, mint, oregano, parsley, pepper, rosemary, sage, savory, sumac, tarragon, thyme   It's not just a diet, it's a lifestyle:  . The Mediterranean diet includes lifestyle factors typical of those in the region  . Foods, drinks and meals are best eaten with others and savored . Daily physical activity is important for overall good health . This could be strenuous exercise like running and aerobics . This could also be more leisurely activities such as walking, housework, yard-work, or taking the stairs . Moderation is the key; a balanced and healthy diet accommodates most foods and drinks . Consider portion sizes and frequency of consumption of certain foods   Meal Ideas & Options:  . Breakfast:  o Whole wheat toast or whole wheat English muffins with peanut butter & hard boiled egg o Steel cut oats topped with apples & cinnamon and skim milk  o Fresh fruit: banana, strawberries, melon, berries, peaches  o Smoothies: strawberries, bananas, greek yogurt, peanut butter o Low fat greek yogurt with blueberries and granola  o Egg white omelet with spinach and mushrooms o Breakfast couscous: whole wheat couscous, apricots, skim milk, cranberries  . Sandwiches:  o Hummus and grilled vegetables (peppers, zucchini, squash) on whole wheat bread   o Grilled chicken on whole wheat pita with lettuce, tomatoes, cucumbers or tzatziki  o Tuna salad on whole  wheat bread: tuna salad made with greek yogurt, olives, red peppers, capers, green onions o Garlic rosemary lamb pita: lamb sauted with garlic, rosemary, salt & pepper; add lettuce, cucumber, greek yogurt to pita - flavor with lemon juice and black pepper  . Seafood:  o Mediterranean grilled salmon, seasoned with garlic, basil, parsley, lemon juice and black pepper o Shrimp, lemon, and spinach whole-grain pasta salad made with low fat greek yogurt  o Seared scallops with lemon orzo  o Seared tuna steaks seasoned salt, pepper, coriander topped with tomato mixture of olives, tomatoes, olive oil, minced garlic, parsley, green onions and cappers  . Meats:  o Herbed greek chicken salad  with kalamata olives, cucumber, feta  o Red bell peppers stuffed with spinach, bulgur, lean ground beef (or lentils) & topped with feta   o Kebabs: skewers of chicken, tomatoes, onions, zucchini, squash  o [place] burgers: made with red onions, mint, dill, lemon juice, feta cheese topped with roasted red peppers . Vegetarian o Cucumber salad: cucumbers, artichoke hearts, celery, red onion, feta cheese, tossed in olive oil & lemon juice  o Hummus and whole grain pita points with a greek salad (lettuce, tomato, feta, olives, cucumbers, red onion) o Lentil soup with celery, carrots made with vegetable broth, garlic, salt and pepper  o Tabouli salad: parsley, bulgur, mint, scallions, cucumbers, tomato, radishes, lemon juice, olive oil, salt and pepper.      American Heart Association (AHA) Exercise Recommendation  Being physically active is important to prevent heart disease and stroke, the nation's No. 1and No. 5killers. To improve overall cardiovascular health, we suggest at least 150 minutes per week of moderate exercise or 75 minutes per week of vigorous exercise (or a combination of moderate and vigorous activity). Thirty minutes a day, five times a week is an easy goal to remember. You will also experience benefits  even if you divide your time into two or three segments of 10 to 15 minutes per day.  For people who would benefit from lowering their blood pressure or cholesterol, we recommend 40 minutes of aerobic exercise of moderate to vigorous intensity three to four times a week to lower the risk for heart attack and stroke.  Physical activity is anything that makes you move your body and burn calories.  This includes things like climbing stairs or playing sports. Aerobic exercises benefit your heart, and include walking, jogging, swimming or biking. Strength and stretching exercises are best for overall stamina and flexibility.  The simplest, positive change you can make to effectively improve your heart health is to start walking. It's enjoyable, free, easy, social and great exercise. A walking program is flexible and boasts high success rates because people can stick with it. It's easy for walking to become a regular and satisfying part of life.   For Overall Cardiovascular Health:  At least 30 minutes of moderate-intensity aerobic activity at least 5 days per week for a total of 150  OR   At least 25 minutes of vigorous aerobic activity at least 3 days per week for a total of 75 minutes; or a combination of moderate- and vigorous-intensity aerobic activity  AND   Moderate- to high-intensity muscle-strengthening activity at least 2 days per week for additional health benefits.  For Lowering Blood Pressure and Cholesterol  An average 40 minutes of moderate- to vigorous-intensity aerobic activity 3 or 4 times per week  What if I can't make it to the time goal? Something is always better than nothing! And everyone has to start somewhere. Even if you've been sedentary for years, today is the day you can begin to make healthy changes in your life. If you don't think you'll make it for 30 or 40 minutes, set a reachable goal for today. You can work up toward your overall goal by increasing your time  as you get stronger. Don't let all-or-nothing thinking rob you of doing what you can every day.  Source:http://www.heart.org

## 2017-08-26 NOTE — Progress Notes (Signed)
Patient ID: Rebecca Allen, female   DOB: 1966/04/19, 52 y.o.   MRN: [id number]    Gynecology Annual Exam  PCP: Jinny Sanders, MD  Chief Complaint:  Chief Complaint  Patient presents with  . Annual Exam    History of Present Illness: Patient is a 52 y.o. [id number] presents for annual exam. The patient has some complaints today of skin tags and other general skin changes, dry hair, occasional lower abdominal cramping, vaginal pain that she thinks may be scar tissue, hot flashes, large breasts. She wonders what can be done about all of these things. Discussion of healthy lifestyle- diet/exercise, hormone replacement, quitting smoking, breast reduction surgery if she desires smaller breasts and recommendation for dermatology appointment  LMP: No LMP recorded. Patient has had a hysterectomy.  Postcoital Bleeding: no Dysmenorrhea: not applicable   The patient is sexually active. She currently uses status post hysterectomy for contraception. She denies dyspareunia.  The patient does not perform self breast exams.  There is no notable family history of breast or ovarian cancer in her family.  The patient wears seatbelts: yes.   The patient has regular exercise: no.  She admits to improving her diet and adequate hydration. She smokes about 1/4 PPD and her goal is to quit. She has a prescription for Wellbutrin but has not yet started it. She takes xanax PRN for anxiety/stress  The patient denies current symptoms of depression.    Review of Systems: Review of Systems  Constitutional: Negative.   HENT: Negative.   Eyes: Negative.   Respiratory: Negative.   Cardiovascular: Negative.   Gastrointestinal: Negative.   Genitourinary: Negative.   Musculoskeletal: Negative.   Skin: Negative.   Neurological: Negative.   Endo/Heme/Allergies: Negative.   Psychiatric/Behavioral: Negative.     Past Medical History:  Past Medical History:  Diagnosis Date  . Depression   . GERD  (gastroesophageal reflux disease)   . Hyperlipidemia   . Treadmill stress test negative for angina pectoris 12/18/2010    Past Surgical History:  Past Surgical History:  Procedure Laterality Date  . ABDOMINAL HYSTERECTOMY    . CHOLECYSTECTOMY    . COLONOSCOPY    . PARTIAL HYSTERECTOMY  08-1999   abdominal    Gynecologic History:  No LMP recorded. Patient has had a hysterectomy. Contraception: status post hysterectomy Last Pap: 2017 Results were:  no abnormalities  Last mammogram: 2017 Results were: BI-RAD I Obstetric History: [id number]  Family History:  Family History  Problem Relation Age of Onset  . COPD Mother   . Heart attack Father 46  . Hypertension Father   . Heart disease Father        stent placement   . Leukemia Brother   . Kidney failure Maternal Grandfather   . COPD Paternal Grandmother   . Congenital heart disease Sister     Social History:  Social History   Socioeconomic History  . Marital status: Married    Spouse name: Not on file  . Number of children: Not on file  . Years of education: Not on file  . Highest education level: Not on file  Social Needs  . Financial resource strain: Not on file  . Food insecurity - worry: Not on file  . Food insecurity - inability: Not on file  . Transportation needs - medical: Not on file  . Transportation needs - non-medical: Not on file  Occupational History  . Occupation: Research scientist (life sciences): [employer]    Comment: [employer] in [place]  Tobacco Use  . Smoking status: Current Every Day Smoker    Packs/day: 0.25    Years: 30.00    Pack years: 7.50    Types: Cigarettes  . Smokeless tobacco: Never Used  Substance and Sexual Activity  . Alcohol use: Yes    Alcohol/week: 1.2 oz    Types: 1 Glasses of wine, 1 Cans of beer per week    Comment: beer ocassionally  . Drug use: No  . Sexual activity: Yes    Birth control/protection: Surgical  Other Topics Concern  . Not on file  Social  History Narrative   Regular exercise- yes, occ walking   Diet- fruits and veggies, water     Allergies:  Allergies  Allergen Reactions  . Metoclopramide Hcl     REACTION: Nervousness  . Morphine And Related Nausea Only  . Penicillins     REACTION: Upsets stomach    Medications: Prior to Admission medications   Medication Sig Start Date End Date Taking? Authorizing Provider  ALPRAZolam Duanne Moron) 0.5 MG tablet Take 1 tablet (0.5 mg total) by mouth 2 (two) times daily as needed for anxiety. 06/08/17  Yes Bedsole, Amy E, MD  aspirin 81 MG tablet Take 81 mg by mouth daily.   Yes [provider]  buPROPion (WELLBUTRIN XL) 150 MG 24 hr tablet Take 1 tablet (150 mg total) by mouth daily. 06/08/17  Yes Bedsole, Amy E, MD  calcium carbonate (TUMS - DOSED IN MG ELEMENTAL CALCIUM) 500 MG chewable tablet Chew 6 tablets by mouth daily as needed for indigestion or heartburn.   Yes [provider]  estradiol (VIVELLE-DOT) 0.05 MG/24HR patch Place 1 patch trans-dermally twice a week. Remove old patch before applying new patch. 08/26/17  Yes Rod Can, CNM  KRILL OIL OMEGA-3 PO Take by mouth daily.   Yes [provider]  omeprazole (PRILOSEC) 20 MG capsule Take 1 capsule (20 mg total) by mouth daily. 06/08/17  Yes Bedsole, Amy E, MD  Coenzyme Q10 (CO Q-10) 200 MG CAPS Take 200 mg by mouth daily.    [provider]  Magnesium Oxide 400 MG CAPS Take 400 mg by mouth daily.    [provider]    Physical Exam Vitals: Blood pressure 118/70, height 5\' 5"  (1.651 m), weight 178 lb (80.7 kg).  General: NAD HEENT: normocephalic, anicteric Thyroid: no enlargement, no palpable nodules Pulmonary: No increased work of breathing, CTAB Cardiovascular: RRR, distal pulses 2+ Breast: Breast symmetrical, no tenderness, no palpable nodules or masses, no skin or nipple retraction present, no nipple discharge.  No axillary or supraclavicular lymphadenopathy. Skin tag under  left breast appears irritated most likely from contact with bra Abdomen: NABS, soft, non-tender, non-distended.  Umbilicus without lesions.  No hepatomegaly, splenomegaly or masses palpable. No evidence of hernia  Genitourinary:  Shared decision making- patient declines pelvic exam for no PAP smear and no concerns such as itching, irritation, discharge, burning. She declines exam for symptoms of vaginal pain since she thinks it is related to scar tissue. Extremities: no edema, erythema, or tenderness Neurologic: Grossly intact Psychiatric: mood appropriate, affect full   Assessment: 52 y.o. [id number] routine annual exam  Plan: Problem List Items Addressed This Visit    None    Visit Diagnoses    Well woman exam without gynecological exam    -  Primary   Relevant Orders   Thyroid Panel With TSH   Hgb A1c w/o eAG   CBC with Differential/Platelet  Comprehensive metabolic panel   Blood tests for routine general physical examination       Relevant Orders   Thyroid Panel With TSH   Hgb A1c w/o eAG   CBC with Differential/Platelet   Comprehensive metabolic panel   Hot flashes due to surgical menopause       Relevant Medications   estradiol (VIVELLE-DOT) 0.05 MG/24HR patch      1) Mammogram - recommend yearly screening mammogram.  Mammogram Was ordered today  2) STI screening was offered and declined  3) ASCCP guidelines and rational discussed.  Patient opts for every 5 years screening interval  4) Contraception - s/p hysterectomy  5) Colonoscopy: patient is up to date-- Screening recommended starting at age 50 for average risk individuals, age 58 for individuals deemed at increased risk (including African Americans) and recommended to continue until age 107.  For patient age 74-85 individualized approach is recommended.  Gold standard screening is via colonoscopy, Cologuard screening is an acceptable alternative for patient unwilling or unable to undergo colonoscopy.  "Colorectal  cancer screening for average?risk adults: 2018 guideline update from the [place]: A Cancer Journal for Clinicians: Jan 06, 2017   6) Routine healthcare maintenance including cholesterol, diabetes screening discussed Ordered today   Rod Can, CNM

## 2017-08-27 ENCOUNTER — Ambulatory Visit: Payer: Self-pay | Admitting: Certified Nurse Midwife

## 2017-08-27 LAB — CBC WITH DIFFERENTIAL/PLATELET
BASOS: 0 %
Basophils Absolute: 0 10*3/uL (ref 0.0–0.2)
EOS (ABSOLUTE): 0.1 10*3/uL (ref 0.0–0.4)
Eos: 2 %
HEMATOCRIT: 42.3 % (ref 34.0–46.6)
HEMOGLOBIN: 13.9 g/dL (ref 11.1–15.9)
IMMATURE GRANS (ABS): 0 10*3/uL (ref 0.0–0.1)
IMMATURE GRANULOCYTES: 0 %
Lymphocytes Absolute: 2.4 10*3/uL (ref 0.7–3.1)
Lymphs: 35 %
MCH: 30.7 pg (ref 26.6–33.0)
MCHC: 32.9 g/dL (ref 31.5–35.7)
MCV: 93 fL (ref 79–97)
MONOCYTES: 9 %
MONOS ABS: 0.6 10*3/uL (ref 0.1–0.9)
NEUTROS PCT: 54 %
Neutrophils Absolute: 3.9 10*3/uL (ref 1.4–7.0)
Platelets: 288 10*3/uL (ref 150–379)
RBC: 4.53 x10E6/uL (ref 3.77–5.28)
RDW: 12.9 % (ref 12.3–15.4)
WBC: 7.1 10*3/uL (ref 3.4–10.8)

## 2017-08-27 LAB — COMPREHENSIVE METABOLIC PANEL
A/G RATIO: 1.9 (ref 1.2–2.2)
ALT: 35 IU/L — ABNORMAL HIGH (ref 0–32)
AST: 26 IU/L (ref 0–40)
Albumin: 4.7 g/dL (ref 3.5–5.5)
Alkaline Phosphatase: 60 IU/L (ref 39–117)
BUN / CREAT RATIO: 27 — AB (ref 9–23)
BUN: 20 mg/dL (ref 6–24)
Bilirubin Total: <0.2 mg/dL (ref 0.0–1.2)
CALCIUM: 9.4 mg/dL (ref 8.7–10.2)
CO2: 24 mmol/L (ref 20–29)
CREATININE: 0.75 mg/dL (ref 0.57–1.00)
Chloride: 102 mmol/L (ref 96–106)
GFR calc Af Amer: 108 mL/min/{1.73_m2} (ref >59)
GFR, EST NON AFRICAN AMERICAN: 94 mL/min/{1.73_m2} (ref >59)
GLOBULIN, TOTAL: 2.5 g/dL (ref 1.5–4.5)
Glucose: 75 mg/dL (ref 65–99)
POTASSIUM: 4.5 mmol/L (ref 3.5–5.2)
SODIUM: 140 mmol/L (ref 134–144)
TOTAL PROTEIN: 7.2 g/dL (ref 6.0–8.5)

## 2017-08-27 LAB — THYROID PANEL WITH TSH
Free Thyroxine Index: 1.6 (ref 1.2–4.9)
T3 UPTAKE RATIO: 26 % (ref 24–39)
T4 TOTAL: 6.3 ug/dL (ref 4.5–12.0)
TSH: 1.56 u[IU]/mL (ref 0.450–4.500)

## 2017-08-27 LAB — HGB A1C W/O EAG: Hgb A1c MFr Bld: 5.5 % (ref 4.8–5.6)

## 2017-08-31 ENCOUNTER — Telehealth: Payer: Self-pay | Admitting: Advanced Practice Midwife

## 2017-08-31 NOTE — Telephone Encounter (Signed)
Pt is calling due to missed call. Please advise °

## 2017-09-07 ENCOUNTER — Other Ambulatory Visit: Payer: Self-pay

## 2017-09-07 ENCOUNTER — Ambulatory Visit (INDEPENDENT_AMBULATORY_CARE_PROVIDER_SITE_OTHER)
Admission: RE | Admit: 2017-09-07 | Discharge: 2017-09-07 | Disposition: A | Discharge status: Home / Self Care | Payer: 59 | Source: Ambulatory Visit | Attending: Family Medicine | Admitting: Family Medicine

## 2017-09-07 ENCOUNTER — Ambulatory Visit: Payer: 59 | Admitting: Family Medicine

## 2017-09-07 ENCOUNTER — Encounter: Payer: Self-pay | Admitting: Family Medicine

## 2017-09-07 ENCOUNTER — Ambulatory Visit: Payer: Self-pay | Admitting: Family Medicine

## 2017-09-07 VITALS — BP 90/60 | HR 79 | Temp 98.6°F | Ht 65.0 in | Wt 177.5 lb

## 2017-09-07 DIAGNOSIS — M79604 Pain in right leg: Secondary | ICD-10-CM

## 2017-09-07 DIAGNOSIS — M79605 Pain in left leg: Secondary | ICD-10-CM

## 2017-09-07 DIAGNOSIS — M47816 Spondylosis without myelopathy or radiculopathy, lumbar region: Secondary | ICD-10-CM | POA: Diagnosis not present

## 2017-09-07 DIAGNOSIS — R6884 Jaw pain: Secondary | ICD-10-CM | POA: Diagnosis not present

## 2017-09-07 LAB — C-REACTIVE PROTEIN: CRP: 0.2 mg/dL — AB (ref 0.5–20.0)

## 2017-09-07 LAB — SEDIMENTATION RATE: SED RATE: 8 mm/h (ref 0–20)

## 2017-09-07 LAB — CK: Total CK: 82 U/L (ref 7–177)

## 2017-09-07 MED ORDER — TRAMADOL HCL 50 MG PO TABS: 50.0000 mg | ORAL_TABLET | Freq: Three times a day (TID) | ORAL | 0 refills | Status: DC | PRN

## 2017-09-07 NOTE — Progress Notes (Signed)
Subjective:    Patient ID: Rebecca Allen, female    DOB: [birth year], 52 y.o.   MRN: [id number]  HPI   52 year old female with history of GAD presents with multiple new complaints.  1. Leg pain: Bilateral. Upper and lower legs  Ongoing in last  few month, gradually worsening. Off red yeast rice no better. Cannot sleep at night. Massage helps a little. No numbness, no incontinence,  No weakness.  Better when she stands. Worse at rest.  No swelling.  Chronic low back [pain.Marland Kitchen Hx of injections.  no current neck pain.. From DD in cervical spine currently  2. Feels shaky eating regularly. 6 small meals a day.  3. Jaw pain.Marland Kitchen Not with opening mouth. Achy in both cheeks  4. Sores on legs: small red bumps in last few days, itchy  She had recent eval and wellness exam with GYN on 08/26/2017 CMET:  NML except ALt 35 CBC: NML  TSH, free t3 and t4: nml  A!C nml   Review of Systems  Constitutional: Negative for fatigue and fever.  HENT: Negative for ear pain.   Eyes: Negative for pain.  Respiratory: Negative for chest tightness and shortness of breath.   Cardiovascular: Negative for chest pain, palpitations and leg swelling.  Gastrointestinal: Negative for abdominal pain.  Genitourinary: Negative for dysuria.       Objective:   Physical Exam  Constitutional: She is oriented to person, place, and time. Vital signs are normal. She appears well-developed and well-nourished. She is cooperative.  Non-toxic appearance. She does not appear ill. No distress.  HENT:  Head: Normocephalic.  Right Ear: Hearing, tympanic membrane, external ear and ear canal normal. Tympanic membrane is not erythematous, not retracted and not bulging.  Left Ear: Hearing, tympanic membrane, external ear and ear canal normal. Tympanic membrane is not erythematous, not retracted and not bulging.  Nose: No mucosal edema or rhinorrhea. Right sinus exhibits no maxillary sinus tenderness and no frontal sinus  tenderness. Left sinus exhibits no maxillary sinus tenderness and no frontal sinus tenderness.  Mouth/Throat: Uvula is midline, oropharynx is clear and moist and mucous membranes are normal.  Sore over bilateral masseter muscle .Marland Kitchen No ttp over tmj, no popping.  Eyes: Conjunctivae, EOM and lids are normal. Pupils are equal, round, and reactive to light. Lids are everted and swept, no foreign bodies found.  Neck: Trachea normal and normal range of motion. Neck supple. Carotid bruit is not present. No thyroid mass and no thyromegaly present.  Cardiovascular: Normal rate, regular rhythm, S1 normal, S2 normal, normal heart sounds, intact distal pulses and normal pulses. Exam reveals no gallop and no friction rub.  No murmur heard. Pulmonary/Chest: Effort normal and breath sounds normal. No tachypnea. No respiratory distress. She has no decreased breath sounds. She has no wheezes. She has no rhonchi. She has no rales.  Abdominal: Soft. Normal appearance and bowel sounds are normal. There is no tenderness.  Musculoskeletal:       Lumbar back: She exhibits tenderness. She exhibits normal range of motion.  Pain with trying to stand with squatiting. Neg SLR but muscle sin legs sore with stretching  Neurological: She is alert and oriented to person, place, and time. She has normal strength and normal reflexes. No cranial nerve deficit or sensory deficit. She exhibits normal muscle tone. She displays a negative Romberg sign. Coordination and gait normal. GCS eye subscore is 4. GCS verbal subscore is 5. GCS motor subscore is 6.  Nml cerebellar  exam   No papilledema  Skin: Skin is warm, dry and intact. No rash noted.  Dry skin and 3 erythematous papules on bilateral legs  Psychiatric: She has a normal mood and affect. Her speech is normal and behavior is normal. Judgment and thought content normal. Her mood appears not anxious. Cognition and memory are normal. Cognition and memory are not impaired. She does not  exhibit a depressed mood. She exhibits normal recent memory and normal remote memory.          Assessment & Plan:

## 2017-09-07 NOTE — Addendum Note (Signed)
Addended by: Eliezer Lofts E on: 09/07/2017 03:36 PM   Modules accepted: Orders

## 2017-09-07 NOTE — Patient Instructions (Addendum)
Increase water as much as able    Please stop at the lab to have labs drawn.  We will call you with X-ray results.  Moisturize legs with cetaphil cream daily. Start 2 week course of OTC  Hydrocortisone cream twice daily on rash.  See Derm if not improving.  Stay off red yeast rice. Low cholesterol diet.  Can use  Tylenol or ibuprofen for pain during the day. Will given rx for tramadol for breakthrough pain

## 2017-09-07 NOTE — Assessment & Plan Note (Signed)
Eval with CK and sed rate for rhabdo or  infalmmatory myositis.  She is already off statin.  Given back history .Marland Kitchen Issue could be due to spinal stenosis.  Increase water.

## 2017-09-10 ENCOUNTER — Ambulatory Visit: Payer: Self-pay | Admitting: Family Medicine

## 2017-09-19 DIAGNOSIS — J00 Acute nasopharyngitis [common cold]: Secondary | ICD-10-CM | POA: Diagnosis not present

## 2017-09-19 DIAGNOSIS — M791 Myalgia, unspecified site: Secondary | ICD-10-CM | POA: Diagnosis not present

## 2017-09-20 ENCOUNTER — Other Ambulatory Visit: Payer: Self-pay | Admitting: Advanced Practice Midwife

## 2017-09-20 DIAGNOSIS — Z0000 Encounter for general adult medical examination without abnormal findings: Secondary | ICD-10-CM

## 2017-09-20 NOTE — Progress Notes (Signed)
Referral to dermatology sent as patient requested

## 2017-09-21 ENCOUNTER — Ambulatory Visit: Payer: Self-pay | Admitting: Internal Medicine

## 2017-09-21 ENCOUNTER — Other Ambulatory Visit: Payer: Self-pay | Admitting: Advanced Practice Midwife

## 2017-09-21 DIAGNOSIS — L918 Other hypertrophic disorders of the skin: Secondary | ICD-10-CM

## 2017-09-21 NOTE — Progress Notes (Signed)
Referral request sent for dermatology for skin tag under left breast.

## 2017-11-01 DIAGNOSIS — D485 Neoplasm of uncertain behavior of skin: Secondary | ICD-10-CM | POA: Diagnosis not present

## 2017-11-01 DIAGNOSIS — D2261 Melanocytic nevi of right upper limb, including shoulder: Secondary | ICD-10-CM | POA: Diagnosis not present

## 2017-11-03 DIAGNOSIS — M545 Low back pain: Secondary | ICD-10-CM | POA: Diagnosis not present

## 2017-11-03 DIAGNOSIS — R002 Palpitations: Secondary | ICD-10-CM | POA: Diagnosis not present

## 2017-12-29 ENCOUNTER — Other Ambulatory Visit: Payer: Self-pay

## 2017-12-29 ENCOUNTER — Emergency Department: Payer: 59

## 2017-12-29 ENCOUNTER — Encounter: Payer: Self-pay | Admitting: Emergency Medicine

## 2017-12-29 DIAGNOSIS — Z9049 Acquired absence of other specified parts of digestive tract: Secondary | ICD-10-CM | POA: Insufficient documentation

## 2017-12-29 DIAGNOSIS — Z79899 Other long term (current) drug therapy: Secondary | ICD-10-CM | POA: Insufficient documentation

## 2017-12-29 DIAGNOSIS — F17210 Nicotine dependence, cigarettes, uncomplicated: Secondary | ICD-10-CM | POA: Diagnosis not present

## 2017-12-29 DIAGNOSIS — Z7982 Long term (current) use of aspirin: Secondary | ICD-10-CM | POA: Diagnosis not present

## 2017-12-29 DIAGNOSIS — R079 Chest pain, unspecified: Secondary | ICD-10-CM | POA: Diagnosis not present

## 2017-12-29 DIAGNOSIS — R0789 Other chest pain: Secondary | ICD-10-CM | POA: Diagnosis not present

## 2017-12-29 DIAGNOSIS — R1013 Epigastric pain: Secondary | ICD-10-CM | POA: Diagnosis not present

## 2017-12-29 DIAGNOSIS — R109 Unspecified abdominal pain: Secondary | ICD-10-CM | POA: Diagnosis not present

## 2017-12-29 DIAGNOSIS — R1010 Upper abdominal pain, unspecified: Secondary | ICD-10-CM | POA: Diagnosis present

## 2017-12-29 DIAGNOSIS — F411 Generalized anxiety disorder: Secondary | ICD-10-CM | POA: Insufficient documentation

## 2017-12-29 DIAGNOSIS — F329 Major depressive disorder, single episode, unspecified: Secondary | ICD-10-CM | POA: Insufficient documentation

## 2017-12-29 LAB — BASIC METABOLIC PANEL
Anion gap: 8 (ref 5–15)
BUN: 17 mg/dL (ref 6–20)
CALCIUM: 9.5 mg/dL (ref 8.9–10.3)
CO2: 28 mmol/L (ref 22–32)
CREATININE: 0.75 mg/dL (ref 0.44–1.00)
Chloride: 103 mmol/L (ref 101–111)
GFR calc Af Amer: >60 mL/min (ref >60)
GLUCOSE: 94 mg/dL (ref 65–99)
Potassium: 4.3 mmol/L (ref 3.5–5.1)
SODIUM: 139 mmol/L (ref 135–145)

## 2017-12-29 LAB — CBC
HEMATOCRIT: 43.9 % (ref 35.0–47.0)
Hemoglobin: 14.8 g/dL (ref 12.0–16.0)
MCH: 31.3 pg (ref 26.0–34.0)
MCHC: 33.8 g/dL (ref 32.0–36.0)
MCV: 92.8 fL (ref 80.0–100.0)
PLATELETS: 265 10*3/uL (ref 150–440)
RBC: 4.73 MIL/uL (ref 3.80–5.20)
RDW: 13.7 % (ref 11.5–14.5)
WBC: 8.6 10*3/uL (ref 3.6–11.0)

## 2017-12-29 LAB — TROPONIN I

## 2017-12-29 MED ORDER — GI COCKTAIL ~~LOC~~
30.0000 mL | Freq: Once | ORAL | Status: AC
  Administered 2017-12-29: 30 mL via ORAL
  Filled 2017-12-29: qty 30

## 2017-12-29 NOTE — ED Triage Notes (Signed)
Patient to ER for c/o chest pain that radiates through to back and down left arm. Patient states her left arm "just feels like rubber". Patient states "stomach was upset pretty much all day". Patient took 324 ASA at home prior to arrival. Also took Mylanta without relief. Reports feeling indigestion/pressure like pain to chest.

## 2017-12-30 ENCOUNTER — Emergency Department: Payer: 59

## 2017-12-30 ENCOUNTER — Emergency Department
Admission: EM | Admit: 2017-12-30 | Discharge: 2017-12-30 | Disposition: A | Discharge status: Home / Self Care | Payer: 59 | Attending: Emergency Medicine | Admitting: Emergency Medicine

## 2017-12-30 ENCOUNTER — Encounter: Payer: Self-pay | Admitting: Radiology

## 2017-12-30 DIAGNOSIS — R109 Unspecified abdominal pain: Secondary | ICD-10-CM | POA: Diagnosis not present

## 2017-12-30 DIAGNOSIS — R1013 Epigastric pain: Secondary | ICD-10-CM

## 2017-12-30 LAB — HEPATIC FUNCTION PANEL
ALBUMIN: 4.2 g/dL (ref 3.5–5.0)
ALT: 45 U/L (ref 14–54)
AST: 43 U/L — AB (ref 15–41)
Alkaline Phosphatase: 47 U/L (ref 38–126)
TOTAL PROTEIN: 7.2 g/dL (ref 6.5–8.1)
Total Bilirubin: 0.5 mg/dL (ref 0.3–1.2)

## 2017-12-30 LAB — LIPASE, BLOOD: Lipase: 35 U/L (ref 11–51)

## 2017-12-30 LAB — TROPONIN I

## 2017-12-30 MED ORDER — HYDROCODONE-ACETAMINOPHEN 5-325 MG PO TABS: 1.0000 | ORAL_TABLET | Freq: Four times a day (QID) | ORAL | 0 refills | Status: DC | PRN

## 2017-12-30 MED ORDER — HYDROCODONE-ACETAMINOPHEN 5-325 MG PO TABS
1.0000 | ORAL_TABLET | Freq: Once | ORAL | Status: AC
  Administered 2017-12-30: 1 via ORAL
  Filled 2017-12-30: qty 1

## 2017-12-30 MED ORDER — IOPAMIDOL (ISOVUE-300) INJECTION 61%
100.0000 mL | Freq: Once | INTRAVENOUS | Status: AC | PRN
  Administered 2017-12-30: 100 mL via INTRAVENOUS

## 2017-12-30 MED ORDER — FAMOTIDINE 20 MG PO TABS: 20.0000 mg | ORAL_TABLET | Freq: Two times a day (BID) | ORAL | 0 refills | Status: DC

## 2017-12-30 MED ORDER — ONDANSETRON 4 MG PO TBDP
4.0000 mg | ORAL_TABLET | Freq: Once | ORAL | Status: AC
Start: 2017-12-30 — End: 2017-12-30
  Administered 2017-12-30: 4 mg via ORAL
  Filled 2017-12-30: qty 1

## 2017-12-30 NOTE — ED Provider Notes (Signed)
[facility] Emergency Department Provider Note  ____________________________________________   First MD Initiated Contact with Patient 12/30/17 [phone]     (approximate)  I have reviewed the triage vital signs and the nursing notes.   HISTORY  Chief Complaint Chest Pain   HPI Rebecca Allen is a 52 y.o. female who comes to the emergency department with upper abdominal pain for the past several days.  I appreciate the nursing note but to me she denies actual chest pain saying that it is upper abdominal radiating towards her back.  Nonexertional.  No shortness of breath.  Pain does seem to radiate down her left arm.  She has a long-standing history of gastric reflux and has been intermittently compliant with her Protonix.  She got a GI cocktail prior to my evaluation which did resolve her symptoms but only for about 10 or 15 minutes.  She has a previous history of cholecystectomy.  No diarrhea.  Past Medical History:  Diagnosis Date  . Depression   . GERD (gastroesophageal reflux disease)   . Hyperlipidemia   . Treadmill stress test negative for angina pectoris 12/18/2010    Patient Active Problem List   Diagnosis Date Noted  . Bilateral leg pain 09/07/2017  . Palpitations 05/25/2017  . Chronic cervical radiculopathy 07/07/2016  . Left sided sciatica 04/08/2016  . Lymphadenopathy 02/07/2016  . Post-traumatic stress 10/05/2014  . Panic attacks 07/25/2013  . LOW BACK PAIN, CHRONIC 05/30/2010  . Generalized anxiety disorder 05/16/2009  . Fatigue 05/01/2009  . PERS HX TOBACCO USE PRESENTING HAZARDS HEALTH 05/07/2008  . Mixed hyperlipidemia 03/28/2008  . COMMON MIGRAINE 03/28/2008  . GERD 03/28/2008  . COSTOCHONDRITIS, RECURRENT 03/28/2008  . Atypical chest pain 03/28/2008    Past Surgical History:  Procedure Laterality Date  . ABDOMINAL HYSTERECTOMY    . CHOLECYSTECTOMY    . COLONOSCOPY    . PARTIAL HYSTERECTOMY  08-1999   abdominal     Prior to Admission medications   Medication Sig Start Date End Date Taking? Authorizing Provider  ALPRAZolam Duanne Moron) 0.5 MG tablet Take 1 tablet (0.5 mg total) by mouth 2 (two) times daily as needed for anxiety. 06/08/17   Bedsole, Amy E, MD  aspirin 81 MG tablet Take 81 mg by mouth daily.    [provider]  buPROPion (WELLBUTRIN XL) 150 MG 24 hr tablet Take 1 tablet (150 mg total) by mouth daily. Patient not taking: Reported on 09/07/2017 06/08/17   Jinny Sanders, MD  calcium carbonate (TUMS - DOSED IN MG ELEMENTAL CALCIUM) 500 MG chewable tablet Chew 6 tablets by mouth daily as needed for indigestion or heartburn.    [provider]  estradiol (VIVELLE-DOT) 0.05 MG/24HR patch Place 1 patch trans-dermally twice a week. Remove old patch before applying new patch. 08/26/17   Rod Can, CNM  famotidine (PEPCID) 20 MG tablet Take 1 tablet (20 mg total) by mouth 2 (two) times daily. 12/30/17 12/30/18  Darel Hong, MD  HYDROcodone-acetaminophen (NORCO) 5-325 MG tablet Take 1 tablet by mouth every 6 (six) hours as needed for up to 7 doses for severe pain. 12/30/17   Darel Hong, MD  KRILL OIL OMEGA-3 PO Take by mouth daily.    [provider]  omeprazole (PRILOSEC) 20 MG capsule Take 1 capsule (20 mg total) by mouth daily. 06/08/17   Bedsole, Amy E, MD  traMADol (ULTRAM) 50 MG tablet Take 1 tablet (50 mg total) by mouth every 8 (eight) hours as needed. 09/07/17  Bedsole, Amy E, MD    Allergies Metoclopramide hcl; Morphine and related; and Penicillins  Family History  Problem Relation Age of Onset  . COPD Mother   . Heart attack Father 2  . Hypertension Father   . Heart disease Father        stent placement   . Leukemia Brother   . Kidney failure Maternal Grandfather   . COPD Paternal Grandmother   . Congenital heart disease Sister     Social History Social History   Tobacco Use  . Smoking status: Current Every Day Smoker    Packs/day: 0.25     Years: 30.00    Pack years: 7.50    Types: Cigarettes  . Smokeless tobacco: Never Used  Substance Use Topics  . Alcohol use: Yes    Alcohol/week: 1.2 oz    Types: 1 Glasses of wine, 1 Cans of beer per week    Comment: beer ocassionally  . Drug use: No    Review of Systems Constitutional: No fever/chills Eyes: No visual changes. ENT: No sore throat. Cardiovascular: Positive for chest pain. Respiratory: Denies shortness of breath. Gastrointestinal: Positive for abdominal pain.  Positive for nausea, no vomiting.  No diarrhea.  No constipation. Genitourinary: Negative for dysuria. Musculoskeletal: Negative for back pain. Skin: Negative for rash. Neurological: Negative for headaches, focal weakness or numbness.   ____________________________________________   PHYSICAL EXAM:  VITAL SIGNS: ED Triage Vitals  Enc Vitals Group     BP 12/29/17 2302 126/75     Pulse Rate 12/29/17 2302 73     Resp 12/29/17 2302 20     Temp 12/29/17 2302 98 F (36.7 C)     Temp Source 12/29/17 2302 Oral     SpO2 12/29/17 2302 100 %     Weight 12/29/17 2307 169 lb (76.7 kg)     Height 12/29/17 2307 5' 5.5" (1.664 m)     Head Circumference --      Peak Flow --      Pain Score 12/29/17 2307 10     Pain Loc --      Pain Edu? --      Excl. in [place]? --     Constitutional: Alert and oriented x4 tearful and uncomfortable appearing nontoxic no diaphoresis speaks full clear sentences Eyes: PERRL EOMI. Head: Atraumatic. Nose: No congestion/rhinnorhea. Mouth/Throat: No trismus Neck: No stridor.   Cardiovascular: Normal rate, regular rhythm. Grossly normal heart sounds.  Good peripheral circulation. Respiratory: Normal respiratory effort.  No retractions. Lungs CTAB and moving good air Gastrointestinal: Soft abdomen diffuse upper abdominal tenderness with no rebound or guarding no peritonitis no McBurney's tenderness negative Rovsing's no costovertebral tenderness Musculoskeletal: No lower extremity  edema   Neurologic:  Normal speech and language. No gross focal neurologic deficits are appreciated. Skin:  Skin is warm, dry and intact. No rash noted. Psychiatric: Mood and affect are normal. Speech and behavior are normal.    ____________________________________________   DIFFERENTIAL includes but not limited to  Appendicitis, diverticulitis, gastritis, gastric reflux, esophageal spasm, peptic ulcer disease ____________________________________________   LABS (all labs ordered are listed, but only abnormal results are displayed)  Labs Reviewed  HEPATIC FUNCTION PANEL - Abnormal; Notable for the following components:      Result Value   AST 43 (*)    Bilirubin, Direct <0.1 (*)    All other components within normal limits  BASIC METABOLIC PANEL  CBC  TROPONIN I  LIPASE, BLOOD  TROPONIN I    Lab  work reviewed by me with no signs of acute ischemia x2 __________________________________________  EKG  ED ECG REPORT I, Darel Hong, the attending physician, personally viewed and interpreted this ECG.  Date: 12/30/2017 EKG Time:  Rate: 73 Rhythm: normal sinus rhythm QRS Axis: normal Intervals: normal ST/T Wave abnormalities: normal Narrative Interpretation: no evidence of acute ischemia  ____________________________________________  RADIOLOGY  Chest x-ray reviewed by me with no acute disease CT abdomen pelvis reviewed by me with no etiology of the patient's symptoms identified ____________________________________________   PROCEDURES  Procedure(s) performed: no  Procedures  Critical Care performed: no  Observation: no ____________________________________________   INITIAL IMPRESSION / ASSESSMENT AND PLAN / ED COURSE  Pertinent labs & imaging results that were available during my care of the patient were reviewed by me and considered in my medical decision making (see chart for details).  The patient arrives obviously uncomfortable appearing with  upper abdominal pain.  First troponin is negative however she never had LFTs or lipase sent.  With upper abdominal pain radiating to her back I do have some concern for pancreatitis.  Given the severity of her symptoms CT abdomen pelvis is pending.  The patient's blood work is reassuring and her CT scan does not show an etiology of her symptoms.  The patient's pain persists when the [place] wears off.  Had a lengthy discussion with patient regarding the diagnostic uncertainty and the importance of taking her Protonix as prescribed every day.  We will treat her with Pepcid in the meantime for symptomatic control and refer her to GI as an outpatient.  Precautions have been given and the patient verbalizes understanding and agreement with the plan      ____________________________________________   FINAL CLINICAL IMPRESSION(S) / ED DIAGNOSES  Final diagnoses:  Epigastric pain      NEW MEDICATIONS STARTED DURING THIS VISIT:  New Prescriptions   FAMOTIDINE (PEPCID) 20 MG TABLET    Take 1 tablet (20 mg total) by mouth 2 (two) times daily.   HYDROCODONE-ACETAMINOPHEN (NORCO) 5-325 MG TABLET    Take 1 tablet by mouth every 6 (six) hours as needed for up to 7 doses for severe pain.     Note:  This document was prepared using Dragon voice recognition software and may include unintentional dictation errors.     Darel Hong, MD 12/30/17 [phone]

## 2017-12-30 NOTE — ED Notes (Signed)
Pt reports having back pain, chest pain, and pain shooting down the left arm. She also has pain in the upper abdominal area that started on yesterday. She says the pain shoots right through her chest into her back. Pt alert and oriented x4.

## 2017-12-30 NOTE — Discharge Instructions (Signed)
It was a pleasure to take care of you today, and thank you for coming to our emergency department.  If you have any questions or concerns before leaving please ask the nurse to grab me and I'm more than happy to go through your aftercare instructions again.  If you were prescribed any opioid pain medication today such as Norco, Vicodin, Percocet, morphine, hydrocodone, or oxycodone please make sure you do not drive when you are taking this medication as it can alter your ability to drive safely.  If you have any concerns once you are home that you are not improving or are in fact getting worse before you can make it to your follow-up appointment, please do not hesitate to call 911 and come back for further evaluation.  Darel Hong, MD  Results for orders placed or performed during the hospital encounter of 29/52/84  Basic metabolic panel  Result Value Ref Range   Sodium 139 135 - 145 mmol/L   Potassium 4.3 3.5 - 5.1 mmol/L   Chloride 103 101 - 111 mmol/L   CO2 28 22 - 32 mmol/L   Glucose, Bld 94 65 - 99 mg/dL   BUN 17 6 - 20 mg/dL   Creatinine, Ser 0.75 0.44 - 1.00 mg/dL   Calcium 9.5 8.9 - 10.3 mg/dL   GFR calc non Af Amer >60 >60 mL/min   GFR calc Af Amer >60 >60 mL/min   Anion gap 8 5 - 15  CBC  Result Value Ref Range   WBC 8.6 3.6 - 11.0 K/uL   RBC 4.73 3.80 - 5.20 MIL/uL   Hemoglobin 14.8 12.0 - 16.0 g/dL   HCT 43.9 35.0 - 47.0 %   MCV 92.8 80.0 - 100.0 fL   MCH 31.3 26.0 - 34.0 pg   MCHC 33.8 32.0 - 36.0 g/dL   RDW 13.7 11.5 - 14.5 %   Platelets 265 150 - 440 K/uL  Troponin I  Result Value Ref Range   Troponin I <0.03 <0.03 ng/mL  Hepatic function panel  Result Value Ref Range   Total Protein 7.2 6.5 - 8.1 g/dL   Albumin 4.2 3.5 - 5.0 g/dL   AST 43 (H) 15 - 41 U/L   ALT 45 14 - 54 U/L   Alkaline Phosphatase 47 38 - 126 U/L   Total Bilirubin 0.5 0.3 - 1.2 mg/dL   Bilirubin, Direct <0.1 (L) 0.1 - 0.5 mg/dL   Indirect Bilirubin NOT CALCULATED 0.3 - 0.9 mg/dL    Lipase, blood  Result Value Ref Range   Lipase 35 11 - 51 U/L  Troponin I  Result Value Ref Range   Troponin I <0.03 <0.03 ng/mL   Dg Chest 2 View  Result Date: 12/29/2017 CLINICAL DATA:  Chest pain radiating to the back and down left arm. EXAM: CHEST - 2 VIEW COMPARISON:  05/03/2017 FINDINGS: The heart size and mediastinal contours are within normal limits. Both lungs are clear. The visualized skeletal structures are unremarkable. IMPRESSION: No active cardiopulmonary disease. Electronically Signed   By: Ashley Royalty M.D.   On: 12/29/2017 23:30   Ct Abdomen Pelvis W Contrast  Result Date: 12/30/2017 CLINICAL DATA:  Abdominal pain.  Abdominal infection. EXAM: CT ABDOMEN AND PELVIS WITH CONTRAST TECHNIQUE: Multidetector CT imaging of the abdomen and pelvis was performed using the standard protocol following bolus administration of intravenous contrast. CONTRAST:  16mL ISOVUE-300 IOPAMIDOL (ISOVUE-300) INJECTION 61% COMPARISON:  Abdominal ultrasound 02/01/2017 FINDINGS: Lower chest: Minor lung base atelectasis. No pleural fluid.  No consolidation. Hepatobiliary: Subcentimeter low-density in the anterior left lobe of the liver may be focal fatty infiltration, too small to accurately characterize. No suspicious hepatic lesion. Clips in the gallbladder fossa postcholecystectomy. No biliary dilatation. Pancreas: No ductal dilatation or inflammation. Spleen: Normal in size without focal abnormality. Adrenals/Urinary Tract: Normal adrenal glands. No hydronephrosis or perinephric edema. Homogeneous renal enhancement with symmetric excretion on delayed phase imaging. Punctate nonobstructing stone in the upper left kidney. Urinary bladder is physiologically distended without wall thickening. Stomach/Bowel: Stomach is within normal limits. Appendix appears normal. No evidence of bowel wall thickening, distention, or inflammatory changes. Vascular/Lymphatic: Mild aortic atherosclerosis. No acute vascular findings.  No enlarged abdominal or pelvic lymph nodes. Reproductive: Status post hysterectomy. No adnexal masses. Other: No free air, free fluid, or intra-abdominal fluid collection. Musculoskeletal: No acute or significant osseous findings. IMPRESSION: 1. No acute findings in the abdomen/pelvis. 2. Nonobstructing left renal stone. Electronically Signed   By: Jeb Levering M.D.   On: 12/30/2017 03:36

## 2018-01-05 DIAGNOSIS — R110 Nausea: Secondary | ICD-10-CM | POA: Diagnosis not present

## 2018-01-05 DIAGNOSIS — R1013 Epigastric pain: Secondary | ICD-10-CM | POA: Diagnosis not present

## 2018-01-07 ENCOUNTER — Encounter: Payer: Self-pay | Admitting: Family Medicine

## 2018-01-07 DIAGNOSIS — K3189 Other diseases of stomach and duodenum: Secondary | ICD-10-CM | POA: Diagnosis not present

## 2018-01-07 DIAGNOSIS — K2950 Unspecified chronic gastritis without bleeding: Secondary | ICD-10-CM | POA: Diagnosis not present

## 2018-01-07 DIAGNOSIS — K2990 Gastroduodenitis, unspecified, without bleeding: Secondary | ICD-10-CM | POA: Diagnosis not present

## 2018-01-07 DIAGNOSIS — K319 Disease of stomach and duodenum, unspecified: Secondary | ICD-10-CM | POA: Diagnosis not present

## 2018-01-07 DIAGNOSIS — R1013 Epigastric pain: Secondary | ICD-10-CM | POA: Diagnosis not present

## 2018-01-07 DIAGNOSIS — K2970 Gastritis, unspecified, without bleeding: Secondary | ICD-10-CM | POA: Diagnosis not present

## 2018-01-10 ENCOUNTER — Encounter: Payer: Self-pay | Admitting: Family Medicine

## 2018-01-10 ENCOUNTER — Telehealth: Payer: Self-pay | Admitting: Family Medicine

## 2018-01-10 ENCOUNTER — Ambulatory Visit: Payer: 59 | Admitting: Family Medicine

## 2018-01-10 VITALS — BP 96/62 | HR 70 | Temp 98.3°F | Ht 65.0 in | Wt 169.5 lb

## 2018-01-10 DIAGNOSIS — K2970 Gastritis, unspecified, without bleeding: Secondary | ICD-10-CM | POA: Insufficient documentation

## 2018-01-10 DIAGNOSIS — F411 Generalized anxiety disorder: Secondary | ICD-10-CM | POA: Diagnosis not present

## 2018-01-10 DIAGNOSIS — K2950 Unspecified chronic gastritis without bleeding: Secondary | ICD-10-CM

## 2018-01-10 MED ORDER — BUSPIRONE HCL 7.5 MG PO TABS: 7.5000 mg | ORAL_TABLET | Freq: Three times a day (TID) | ORAL | 3 refills | Status: DC

## 2018-01-10 MED ORDER — ALPRAZOLAM 0.5 MG PO TABS: 0.5000 mg | ORAL_TABLET | Freq: Two times a day (BID) | ORAL | 0 refills | Status: DC | PRN

## 2018-01-10 MED ORDER — BUSPIRONE HCL 15 MG PO TABS: 7.5000 mg | ORAL_TABLET | Freq: Three times a day (TID) | ORAL | 3 refills | Status: DC

## 2018-01-10 NOTE — Telephone Encounter (Signed)
Copied from [place] [id number]. Topic: Quick Communication - Rx Refill/Question >> Jan 10, 2018  2:08 PM Nathanial Millman J wrote: Medication: busPIRone (BUSPAR) 15 MG tablet  Has the patient contacted their pharmacy? Yes.   (Agent: If no, request that the patient contact the pharmacy for the refill.) (Agent: If yes, when and what did the pharmacy advise?)  Preferred Pharmacy (with phone number or street name): [facility] [address]  [facility], please send in rx for 7.5 mg and change qty    Agent: Please be advised that RX refills may take up to 3 business days. We ask that you follow-up with your pharmacy.

## 2018-01-10 NOTE — Assessment & Plan Note (Signed)
Reviewed avail ED notes/Reviewed hospital records, lab results and studies in detail  Also what was avail on care everywhere Recent EGD-she will call GI office asap to alert them of symptoms  Suspect epigastric and chest pain are from gastritis- poss worsened by anxiety  No AAA seen on CT and EKG and troponin was re assuring Also some chest wall discomfort- disc poss of costochondritis Will f/u with GI Also PCP if symptoms continue

## 2018-01-10 NOTE — Assessment & Plan Note (Signed)
This has worsened and now some features of depression also  Records rev incl intol to med in the past Reviewed stressors/ coping techniques/symptoms/ support sources/ tx options and side effects in detail today  Will return to counseling when she can afford It Trial of buspar 7.5 mg bid to try and titrate if needed Discussed expectations of this medication including time to effectiveness and mechanism of action, also poss of side effects (early and late)- including mental fuzziness, weight or appetite change, nausea and poss of worse dep or anxiety (even suicidal thoughts)  Pt voiced understanding and will stop med and update if this occurs   >25 minutes spent in face to face time with patient, >50% spent in counselling or coordination of care -including past record rev and also disc re: imp of self care and suggestion regarding using mediation for mindfulness and sense of calm  No SI or other red flags F/u with PCP in a month

## 2018-01-10 NOTE — Progress Notes (Signed)
Subjective:    Patient ID: Rebecca Allen, female    DOB: 1966/01/11, 52 y.o.   MRN: [id number]  HPI  Here for multiple medical issues   Has not felt well for the last several weeks  Has been in the ED  Did blood work and CT and xray  Given pain medicine-did not work (for pain in her stomach)   Saw GI- had EGD on Friday Now has a lot of pain in chest- rad to back and also epigastric area  Dr Alice Reichert  Told no H pylori / but has gastritis   Not nauseated Has had a lot of diarrhea (but a lot of diarrhea)  Changed from omeprazole to protonix  Was on carfate stopped taking it   No vomiting or blood  Very sore  Not much appetite   No black stool    Leaving for a cruise this week   Lab Results  Component Value Date   WBC 8.6 12/29/2017   HGB 14.8 12/29/2017   HCT 43.9 12/29/2017   MCV 92.8 12/29/2017   PLT 265 12/29/2017   Lab Results  Component Value Date   CREATININE 0.75 12/29/2017   BUN 17 12/29/2017   NA 139 12/29/2017   K 4.3 12/29/2017   CL 103 12/29/2017   CO2 28 12/29/2017   Lab Results  Component Value Date   ALT 45 12/30/2017   AST 43 (H) 12/30/2017   ALKPHOS 47 12/30/2017   BILITOT 0.5 12/30/2017     Also emotional symptoms   Wt Readings from Last 3 Encounters:  01/10/18 169 lb 8 oz (76.9 kg)  12/29/17 169 lb (76.7 kg)  09/07/17 177 lb 8 oz (80.5 kg)   28.21 kg/m   BP Readings from Last 3 Encounters:  01/10/18 96/62  12/30/17 (!) 99/45  09/07/17 90/60     Last xanax refill was 06/08/17 Has hx of GAD Has had counseling    Has had side eff to zoloft ad venlafaxine in the past -(zoloft made her tired / effexor made her feel funny)  Has taken lorazepam for panic attacks in the past   Tearful easily  Irritable easily  Constant worrier- also over thinks (worries about everyone else)  Also unable to experience joy -- 4-5 months (has not talked to PCP)   Talks to her husband and best friend - they are supportive    2  deaths in family  Worries about her kids (daughter does drugs and is in and out of drugs)  Has anger towards her parents (in [place])  Always stress at work  Was seeing Bambi - she moved / ins stopped paying   Does not know how to meditate  Would like to exercise - but not motivated   Had total hyst at 15 -no menopausal symptoms now    Patient Active Problem List   Diagnosis Date Noted  . Gastritis 01/10/2018  . Bilateral leg pain 09/07/2017  . Palpitations 05/25/2017  . Chronic cervical radiculopathy 07/07/2016  . Left sided sciatica 04/08/2016  . Lymphadenopathy 02/07/2016  . Post-traumatic stress 10/05/2014  . Panic attacks 07/25/2013  . LOW BACK PAIN, CHRONIC 05/30/2010  . Generalized anxiety disorder 05/16/2009  . Fatigue 05/01/2009  . PERS HX TOBACCO USE PRESENTING HAZARDS HEALTH 05/07/2008  . Mixed hyperlipidemia 03/28/2008  . COMMON MIGRAINE 03/28/2008  . GERD 03/28/2008  . COSTOCHONDRITIS, RECURRENT 03/28/2008  . Atypical chest pain 03/28/2008   Past Medical History:  Diagnosis Date  . Depression   .  GERD (gastroesophageal reflux disease)   . Hyperlipidemia   . Treadmill stress test negative for angina pectoris 12/18/2010   Past Surgical History:  Procedure Laterality Date  . ABDOMINAL HYSTERECTOMY    . CHOLECYSTECTOMY    . COLONOSCOPY    . PARTIAL HYSTERECTOMY  08-1999   abdominal   Social History   Tobacco Use  . Smoking status: Current Every Day Smoker    Packs/day: 0.25    Years: 30.00    Pack years: 7.50    Types: Cigarettes  . Smokeless tobacco: Never Used  Substance Use Topics  . Alcohol use: Yes    Alcohol/week: 1.2 oz    Types: 1 Glasses of wine, 1 Cans of beer per week    Comment: beer ocassionally  . Drug use: No   Family History  Problem Relation Age of Onset  . COPD Mother   . Heart attack Father 35  . Hypertension Father   . Heart disease Father        stent placement   . Leukemia Brother   . Kidney failure Maternal Grandfather    . COPD Paternal Grandmother   . Congenital heart disease Sister    Allergies  Allergen Reactions  . Metoclopramide Hcl     REACTION: Nervousness  . Morphine And Related Nausea Only  . Penicillins     REACTION: Upsets stomach   Current Outpatient Medications on File Prior to Visit  Medication Sig Dispense Refill  . aspirin 81 MG tablet Take 81 mg by mouth daily.    . calcium carbonate (TUMS - DOSED IN MG ELEMENTAL CALCIUM) 500 MG chewable tablet Chew 6 tablets by mouth daily as needed for indigestion or heartburn.    . estradiol (VIVELLE-DOT) 0.05 MG/24HR patch Place 1 patch trans-dermally twice a week. Remove old patch before applying new patch. 8 patch 11  . KRILL OIL OMEGA-3 PO Take by mouth daily.    . pantoprazole (PROTONIX) 40 MG tablet Take 40 mg by mouth daily.     No current facility-administered medications on file prior to visit.     Review of Systems  Constitutional: Negative for activity change, appetite change, fatigue, fever and unexpected weight change.  HENT: Negative for congestion, ear pain, rhinorrhea, sinus pressure and sore throat.   Eyes: Negative for pain, redness and visual disturbance.  Respiratory: Negative for cough, shortness of breath and wheezing.   Cardiovascular: Positive for chest pain. Negative for palpitations and leg swelling.  Gastrointestinal: Positive for abdominal pain. Negative for abdominal distention, anal bleeding, blood in stool, constipation, diarrhea, nausea, rectal pain and vomiting.       Esophageal pain   Endocrine: Negative for polydipsia and polyuria.  Genitourinary: Negative for dysuria, frequency and urgency.  Musculoskeletal: Negative for arthralgias, back pain and myalgias.  Skin: Negative for pallor and rash.  Allergic/Immunologic: Negative for environmental allergies.  Neurological: Negative for dizziness, syncope and headaches.  Hematological: Negative for adenopathy. Does not bruise/bleed easily.    Psychiatric/Behavioral: Positive for decreased concentration, dysphoric mood and sleep disturbance. Negative for agitation, self-injury and suicidal ideas. The patient is nervous/anxious.        Objective:   Physical Exam  Constitutional: She appears well-developed and well-nourished. No distress.  overwt and anxious appearing   HENT:  Head: Normocephalic and atraumatic.  Mouth/Throat: Oropharynx is clear and moist.  Eyes: Pupils are equal, round, and reactive to light. Conjunctivae and EOM are normal. No scleral icterus.  Neck: Normal range of motion. Neck supple.  Cardiovascular: Normal rate, regular rhythm and normal heart sounds.  Pulmonary/Chest: Effort normal and breath sounds normal. No respiratory distress. She has no wheezes. She has no rales.  Abdominal: Soft. Bowel sounds are normal. She exhibits no distension, no abdominal bruit, no pulsatile midline mass and no mass. There is no hepatosplenomegaly. There is tenderness in the epigastric area and left upper quadrant. There is no rigidity, no rebound, no guarding, no CVA tenderness, no tenderness at McBurney's point and negative Murphy's sign.  Lymphadenopathy:    She has no cervical adenopathy.  Neurological: She is alert. She displays normal reflexes. She exhibits normal muscle tone. Coordination normal.  No tremor   Skin: Skin is warm and dry. No rash noted. No erythema. No pallor.  No jaundice   Psychiatric: Her speech is normal and behavior is normal. Her mood appears anxious. Her affect is not blunt, not labile and not inappropriate. She is not actively hallucinating. Thought content is not paranoid. Cognition and memory are normal. She exhibits a depressed mood. She expresses no homicidal and no suicidal ideation.  Talks openly about stressors  She is attentive.          Assessment & Plan:

## 2018-01-10 NOTE — Telephone Encounter (Signed)
Rx sent 

## 2018-01-10 NOTE — Patient Instructions (Addendum)
If GI has not called yet - please call them as soon as you leave to tell them what is going on with pain after endoscopy     Get back to counseling when you can  Use xanax only when necessary  Try the buspar 7.5 mg twice daily for generalized anxiety  Take care of yourself   Some apps for cell phone for meditation include Headspace and Calm and Breethe and Smiling Mind    Check them out   Follow up with Dr Diona Browner in a month or so

## 2018-02-14 ENCOUNTER — Ambulatory Visit: Payer: Self-pay | Admitting: Internal Medicine

## 2018-02-14 ENCOUNTER — Other Ambulatory Visit: Payer: Self-pay | Admitting: *Deleted

## 2018-02-15 ENCOUNTER — Ambulatory Visit: Payer: 59 | Admitting: Family Medicine

## 2018-05-11 ENCOUNTER — Telehealth: Payer: Self-pay

## 2018-05-11 NOTE — Telephone Encounter (Signed)
Patient is schedule 05/17/18 with JEG

## 2018-05-11 NOTE — Telephone Encounter (Signed)
Pt tried to schedule apt for mammogram, but was told to contact our office due to having problems with the left breast. [phone]

## 2018-05-17 ENCOUNTER — Ambulatory Visit (INDEPENDENT_AMBULATORY_CARE_PROVIDER_SITE_OTHER): Payer: 59 | Admitting: Advanced Practice Midwife

## 2018-05-17 ENCOUNTER — Encounter: Payer: Self-pay | Admitting: Advanced Practice Midwife

## 2018-05-17 VITALS — BP 126/80 | HR 78 | Ht 65.5 in | Wt 182.0 lb

## 2018-05-17 DIAGNOSIS — N644 Mastodynia: Secondary | ICD-10-CM

## 2018-05-17 DIAGNOSIS — N6324 Unspecified lump in the left breast, lower inner quadrant: Secondary | ICD-10-CM

## 2018-05-17 NOTE — Addendum Note (Signed)
Addended by: Rod Can on: 05/17/2018 04:01 PM   Modules accepted: Orders

## 2018-05-17 NOTE — Addendum Note (Signed)
Addended by: Rod Can on: 05/17/2018 03:50 PM   Modules accepted: Orders

## 2018-05-17 NOTE — Progress Notes (Addendum)
Patient ID: Rebecca Allen, female   DOB: 1966-04-23, 52 y.o.   MRN: [id number]  Reason for visit: left breast pain (swelling/painful x 1 month)     Subjective:     HPI:  Rebecca Allen is a 52 y.o. female in the office today for concern of left breast pain and swelling for the past month. She has had ongoing issues of back and shoulder pain due to the weight of her breasts and would like to have a referral for breast reduction surgery. I had intended to order screening mammogram at January 2019 well woman exam but then did not place the order. Patient did not follow up and so she has not had a mammogram since 2016. The results of that mammogram were BiRads 1.   For the past month she has had tenderness in both breasts but especially in the left breast in lower inner quadrant. There is one area that is particularly sensitive. She feels like the left breast has had swelling also. She has not felt a lump, only the tenderness.   Past Medical History:  Diagnosis Date  . Depression   . GERD (gastroesophageal reflux disease)   . Hyperlipidemia   . Treadmill stress test negative for angina pectoris 12/18/2010   Family History  Problem Relation Age of Onset  . COPD Mother   . Heart attack Father 57  . Hypertension Father   . Heart disease Father        stent placement   . Leukemia Brother   . Kidney failure Maternal Grandfather   . COPD Paternal Grandmother   . Congenital heart disease Sister    Past Surgical History:  Procedure Laterality Date  . ABDOMINAL HYSTERECTOMY    . CHOLECYSTECTOMY    . COLONOSCOPY    . PARTIAL HYSTERECTOMY  08-1999   abdominal    Short Social History:  Social History   Tobacco Use  . Smoking status: Current Every Day Smoker    Packs/day: 0.25    Years: 30.00    Pack years: 7.50    Types: Cigarettes  . Smokeless tobacco: Never Used  Substance Use Topics  . Alcohol use: Yes    Alcohol/week: 2.0 standard drinks    Types: 1 Glasses  of wine, 1 Cans of beer per week    Comment: beer ocassionally    Allergies  Allergen Reactions  . Metoclopramide Hcl     REACTION: Nervousness  . Morphine And Related Nausea Only  . Penicillins     REACTION: Upsets stomach    Current Outpatient Medications  Medication Sig Dispense Refill  . ALPRAZolam (XANAX) 0.5 MG tablet Take 1 tablet (0.5 mg total) by mouth 2 (two) times daily as needed for anxiety. 30 tablet 0  . calcium carbonate (TUMS - DOSED IN MG ELEMENTAL CALCIUM) 500 MG chewable tablet Chew 6 tablets by mouth daily as needed for indigestion or heartburn.    . estradiol (VIVELLE-DOT) 0.05 MG/24HR patch Place 1 patch trans-dermally twice a week. Remove old patch before applying new patch. 8 patch 11  . KRILL OIL OMEGA-3 PO Take by mouth daily.    . pantoprazole (PROTONIX) 40 MG tablet Take 40 mg by mouth daily.    Marland Kitchen aspirin 81 MG tablet Take 81 mg by mouth daily.    . busPIRone (BUSPAR) 7.5 MG tablet Take 1 tablet (7.5 mg total) by mouth 3 (three) times daily. (Patient not taking: Reported on 05/17/2018) 90 tablet 3   No current facility-administered  medications for this visit.     Review of Systems  Constitutional:       fatigue  HENT:       Sore throat, nasal congestion, sneezing, coughing, environmental allergies Eyes:       Change in vision Respiratory:       Shortness of breath Cardiovascular: Cardiovascular negative.  GI:       Diarrhea, constipation GU: Genitourinary negative. Musculoskeletal:       Joint pain, muscle weakness Skin: Skin negative.  Neurological:       Dizziness, numbness, tingling, headaches Hematologic:       Bruising easily Psychiatric:       Anxiety  Breast:      Pain/tenderness/swelling in both breasts and especially in left breast lower,inner quadrant       Objective:  Objective   Vitals:   05/17/18 1330  BP: 126/80  Pulse: 78  Weight: 182 lb (82.6 kg)  Height: 5' 5.5" (1.664 m)   Body mass index is 29.83  kg/m.  Constitutional: Well nourished, well developed female in no acute distress.  HEENT: normal Skin: Warm and dry.  Cardiovascular: Regular rate and rhythm.   Respiratory: Clear to auscultation bilateral. Normal respiratory effort Breast: Right breast mildly tender to palpation, no masses palpated, no skin changes, Left Breast increased tenderness in inner, lower quadrant just below 3 o'clock, approximately 6 cm from nipple. Small pea size lump palpated in that area.  Psych: Alert and Oriented x3. No memory deficits. Normal mood and affect.        Assessment/Plan:     52 yo G4 P107 female with breast lump/tenderness of left breast  Diagnostic Tomo bilateral mammogram Limited ultrasound of left breast Limited ultrasound of right breast   [place]
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
## 2018-05-26 ENCOUNTER — Other Ambulatory Visit: Payer: Self-pay

## 2018-06-02 ENCOUNTER — Ambulatory Visit
Admission: RE | Admit: 2018-06-02 | Discharge: 2018-06-02 | Disposition: A | Discharge status: Home / Self Care | Payer: 59 | Source: Ambulatory Visit | Attending: Advanced Practice Midwife | Admitting: Advanced Practice Midwife

## 2018-06-02 DIAGNOSIS — R922 Inconclusive mammogram: Secondary | ICD-10-CM | POA: Diagnosis not present

## 2018-06-02 DIAGNOSIS — N6324 Unspecified lump in the left breast, lower inner quadrant: Secondary | ICD-10-CM | POA: Diagnosis not present

## 2018-06-02 DIAGNOSIS — N644 Mastodynia: Secondary | ICD-10-CM

## 2018-06-02 DIAGNOSIS — N6012 Diffuse cystic mastopathy of left breast: Secondary | ICD-10-CM | POA: Diagnosis not present

## 2018-06-23 ENCOUNTER — Encounter: Payer: Self-pay | Admitting: Internal Medicine

## 2018-06-23 ENCOUNTER — Ambulatory Visit: Payer: 59 | Admitting: Internal Medicine

## 2018-06-23 VITALS — BP 114/62 | HR 81 | Temp 98.2°F | Ht 65.5 in | Wt 177.0 lb

## 2018-06-23 DIAGNOSIS — S335XXA Sprain of ligaments of lumbar spine, initial encounter: Secondary | ICD-10-CM | POA: Diagnosis not present

## 2018-06-23 MED ORDER — CYCLOBENZAPRINE HCL 5 MG PO TABS: 5.0000 mg | ORAL_TABLET | Freq: Every evening | ORAL | 0 refills | Status: DC | PRN

## 2018-06-23 NOTE — Progress Notes (Signed)
Subjective:    Patient ID: Rebecca Allen, female    DOB: Jul 04, 1966, 52 y.o.   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 ALPRAZolam (XANAX) 0.5 MG tablet Take 1 tablet (0.5 mg total) by mouth 2 (two) times daily as needed for anxiety. 30 tablet 0  . aspirin 81 MG tablet Take 81 mg by mouth daily.    . calcium carbonate (TUMS - DOSED IN MG ELEMENTAL CALCIUM) 500 MG chewable tablet Chew 6 tablets by mouth daily as needed for indigestion or heartburn.    . estradiol (VIVELLE-DOT) 0.05 MG/24HR patch Place 1 patch trans-dermally twice a week. Remove old patch before applying new patch. 8 patch 11  . KRILL OIL OMEGA-3 PO Take by mouth daily.    . pantoprazole (PROTONIX) 40 MG tablet Take 40 mg by mouth daily.     No current facility-administered medications on file prior to visit.     Allergies  Allergen Reactions  . Metoclopramide Hcl     REACTION: Nervousness  . Morphine And Related Nausea Only  . Penicillins     REACTION: Upsets stomach    Past Medical History:  Diagnosis Date  . Depression   . GERD (gastroesophageal reflux disease)   . Hyperlipidemia   . Treadmill stress test negative for angina pectoris 12/18/2010    Past Surgical History:  Procedure Laterality Date  . ABDOMINAL HYSTERECTOMY    . CHOLECYSTECTOMY    . COLONOSCOPY    . PARTIAL HYSTERECTOMY  08-1999   abdominal    Family History  Problem Relation Age of Onset  . COPD Mother   . Heart attack Father 52  . Hypertension Father   . Heart disease Father        stent placement   . Leukemia Brother   . Kidney failure  Maternal Grandfather   . COPD Paternal Grandmother   . Congenital heart disease Sister   . Breast cancer Neg Hx     Social History   Socioeconomic History  . Marital status: Married    Spouse name: Not on file  . Number of children: Not on file  . Years of education: Not on file  . Highest education level: Not on file  Occupational History  . Occupation: Research scientist (life sciences): [employer]    Comment: Urgent Care in [place]  . Financial resource strain: Not on file  . Food insecurity:    Worry: Not on file    Inability: Not on file  . Transportation needs:    Medical: Not on file    Non-medical: Not on file  Tobacco Use  . Smoking status: Current Every Day Smoker    Packs/day: 0.25    Years: 30.00    Pack years: 7.50    Types: Cigarettes  . Smokeless tobacco: Never Used  Substance and Sexual Activity  . Alcohol use: Yes    Alcohol/week: 2.0 standard drinks    Types: 1 Glasses of wine, 1 Cans of beer per week    Comment: beer ocassionally  . Drug  use: No  . Sexual activity: Yes    Birth control/protection: Surgical  Lifestyle  . Physical activity:    Days per week: Not on file    Minutes per session: Not on file  . Stress: Not on file  Relationships  . Social connections:    Talks on phone: Not on file    Gets together: Not on file    Attends religious service: Not on file    Active member of club or organization: Not on file    Attends meetings of clubs or organizations: Not on file    Relationship status: Not on file  . Intimate partner violence:    Fear of current or ex partner: Not on file    Emotionally abused: Not on file    Physically abused: Not on file    Forced sexual activity: Not on file  Other Topics Concern  . Not on file  Social History Narrative   Regular exercise- yes, occ walking   Diet- fruits and veggies, water    Review of Systems No leg weakness No loss of bowel or bladder control Does have known  radiculitis in the past ---shots, steroids in the past Stays on PPI daily Had trouble sleeping--- it will spasm on her at times    Objective:   Physical Exam  Constitutional: No distress.  Musculoskeletal:  No spine tenderness Tenderness lateral right lumbar SLR painful on right---slightly on left (with full flexion at hip) ROM fairly normal right hip  Neurological:  Mildly antalgic gait Decreased strength in right hip flexors (from pain)           Assessment & Plan:

## 2018-06-23 NOTE — Patient Instructions (Signed)
You can try an over the counter lidocaine 4% patch on the area (one brand is [employer])

## 2018-06-23 NOTE — Assessment & Plan Note (Signed)
Clearly seems to be muscular Discussed heat and ice (like after work) Can try lidocaine patch Tolerates low dose NSAID with the PPI Cyclobenzaprine for bed

## 2018-07-12 ENCOUNTER — Emergency Department
Admission: EM | Admit: 2018-07-12 | Discharge: 2018-07-12 | Disposition: A | Discharge status: Home / Self Care | Payer: 59 | Attending: Emergency Medicine | Admitting: Emergency Medicine

## 2018-07-12 ENCOUNTER — Other Ambulatory Visit: Payer: Self-pay

## 2018-07-12 ENCOUNTER — Emergency Department: Payer: 59

## 2018-07-12 ENCOUNTER — Encounter: Payer: Self-pay | Admitting: Emergency Medicine

## 2018-07-12 DIAGNOSIS — R0602 Shortness of breath: Secondary | ICD-10-CM | POA: Diagnosis not present

## 2018-07-12 DIAGNOSIS — F17210 Nicotine dependence, cigarettes, uncomplicated: Secondary | ICD-10-CM | POA: Diagnosis not present

## 2018-07-12 DIAGNOSIS — R079 Chest pain, unspecified: Secondary | ICD-10-CM

## 2018-07-12 DIAGNOSIS — R0789 Other chest pain: Secondary | ICD-10-CM | POA: Diagnosis not present

## 2018-07-12 DIAGNOSIS — Z7982 Long term (current) use of aspirin: Secondary | ICD-10-CM | POA: Insufficient documentation

## 2018-07-12 DIAGNOSIS — Z79899 Other long term (current) drug therapy: Secondary | ICD-10-CM | POA: Insufficient documentation

## 2018-07-12 LAB — CBC
HCT: 43.6 % (ref 36.0–46.0)
Hemoglobin: 14.7 g/dL (ref 12.0–15.0)
MCH: 31.3 pg (ref 26.0–34.0)
MCHC: 33.7 g/dL (ref 30.0–36.0)
MCV: 93 fL (ref 80.0–100.0)
NRBC: 0 % (ref 0.0–0.2)
Platelets: 254 10*3/uL (ref 150–400)
RBC: 4.69 MIL/uL (ref 3.87–5.11)
RDW: 12.7 % (ref 11.5–15.5)
WBC: 8.3 10*3/uL (ref 4.0–10.5)

## 2018-07-12 LAB — BASIC METABOLIC PANEL
Anion gap: 7 (ref 5–15)
BUN: 19 mg/dL (ref 6–20)
CO2: 25 mmol/L (ref 22–32)
Calcium: 9 mg/dL (ref 8.9–10.3)
Chloride: 106 mmol/L (ref 98–111)
Creatinine, Ser: 0.68 mg/dL (ref 0.44–1.00)
GFR calc Af Amer: >60 mL/min (ref >60)
Glucose, Bld: 101 mg/dL — ABNORMAL HIGH (ref 70–99)
POTASSIUM: 4 mmol/L (ref 3.5–5.1)
Sodium: 138 mmol/L (ref 135–145)

## 2018-07-12 LAB — TROPONIN I: Troponin I: <0.03 ng/mL (ref <0.03)

## 2018-07-12 NOTE — ED Triage Notes (Signed)
Pt to ED from home c/o left chest pain radiating through to back that started today feels like grabbing pain.  Patient states also feeling lightheaded intermittently and SOB.  Denies cardiac hx, has hx of anxiety and took half a xanax.  Pt speaking in complete and coherent sentences, chest rise even and unlabored, skin warm and dry, in NAD at this time.

## 2018-07-12 NOTE — ED Provider Notes (Signed)
[facility] Emergency Department Provider Note ____________________________________________   First MD Initiated Contact with Patient 07/12/18 1903     (approximate)  I have reviewed the triage vital signs and the nursing notes.   HISTORY  Chief Complaint Chest Pain   HPI Rebecca Allen is a 52 y.o. female with a history of recurrent chest pain as well as panic attacks and depression was presented to the emergency department left-sided chest pain radiating through to her back.  Says the pain is been ongoing since approximately 12:30 PM today.  Says that she was sitting doing work at a computer when the pain started.  Pain is been constant and is a 9 out of 10 stabbing pain.  Associated with shortness of breath.  No nausea, vomiting or diarrhea.  Patient has had multiple previous episodes of similar pain.  Also says that she felt slightly weak this morning and has had intermittent blurred vision throughout the day.  She reports that she has had all the symptoms before in association with the chest pain.  Pain is not worsened with exertion.  Patient with stress test years ago.  Had been seen by Dr. and of cardiology approximately 1 year ago with plan to do a CT angiography to evaluate for coronary plaque.  However, the patient said that she refused because she did not want to take a beta-blocker for the test.  Patient states that she has had a family history of heart disease with people having heart issues in the 38s.  Says that she smokes approximately 5 cigarettes/day and has alcohol occasionally.  Denies any drug use.  Past Medical History:  Diagnosis Date  . Depression   . GERD (gastroesophageal reflux disease)   . Hyperlipidemia   . Treadmill stress test negative for angina pectoris 12/18/2010    Patient Active Problem List   Diagnosis Date Noted  . Lumbar sprain 06/23/2018  . Gastritis 01/10/2018  . Bilateral leg pain 09/07/2017  . Palpitations  05/25/2017  . Chronic cervical radiculopathy 07/07/2016  . Left sided sciatica 04/08/2016  . Lymphadenopathy 02/07/2016  . Post-traumatic stress 10/05/2014  . Panic attacks 07/25/2013  . LOW BACK PAIN, CHRONIC 05/30/2010  . Generalized anxiety disorder 05/16/2009  . Fatigue 05/01/2009  . PERS HX TOBACCO USE PRESENTING HAZARDS HEALTH 05/07/2008  . Mixed hyperlipidemia 03/28/2008  . COMMON MIGRAINE 03/28/2008  . GERD 03/28/2008  . COSTOCHONDRITIS, RECURRENT 03/28/2008  . Atypical chest pain 03/28/2008    Past Surgical History:  Procedure Laterality Date  . ABDOMINAL HYSTERECTOMY    . CHOLECYSTECTOMY    . COLONOSCOPY    . PARTIAL HYSTERECTOMY  08-1999   abdominal    Prior to Admission medications   Medication Sig Start Date End Date Taking? Authorizing Provider  ALPRAZolam Duanne Moron) 0.5 MG tablet Take 1 tablet (0.5 mg total) by mouth 2 (two) times daily as needed for anxiety. 01/10/18   Tower, Wynelle Fanny, MD  aspirin 81 MG tablet Take 81 mg by mouth daily.    [provider]  calcium carbonate (TUMS - DOSED IN MG ELEMENTAL CALCIUM) 500 MG chewable tablet Chew 6 tablets by mouth daily as needed for indigestion or heartburn.    [provider]  cyclobenzaprine (FLEXERIL) 5 MG tablet Take 1 tablet (5 mg total) by mouth at bedtime as needed for muscle spasms. 06/23/18   Venia Carbon, MD  estradiol (VIVELLE-DOT) 0.05 MG/24HR patch Place 1 patch trans-dermally twice a week. Remove old patch before applying new  patch. 08/26/17   Rod Can, CNM  KRILL OIL OMEGA-3 PO Take by mouth daily.    [provider]  pantoprazole (PROTONIX) 40 MG tablet Take 40 mg by mouth daily.    [provider]    Allergies Metoclopramide hcl; Morphine and related; and Penicillins  Family History  Problem Relation Age of Onset  . COPD Mother   . Heart attack Father 75  . Hypertension Father   . Heart disease Father        stent placement   . Leukemia Brother   .  Kidney failure Maternal Grandfather   . COPD Paternal Grandmother   . Congenital heart disease Sister   . Breast cancer Neg Hx     Social History Social History   Tobacco Use  . Smoking status: Current Every Day Smoker    Packs/day: 0.25    Years: 30.00    Pack years: 7.50    Types: Cigarettes  . Smokeless tobacco: Never Used  Substance Use Topics  . Alcohol use: Yes    Alcohol/week: 2.0 standard drinks    Types: 1 Glasses of wine, 1 Cans of beer per week    Comment: beer ocassionally  . Drug use: No    Review of Systems  Constitutional: No fever/chills Eyes: As above ENT: No sore throat. Cardiovascular: As above Respiratory: As above Gastrointestinal: No abdominal pain.  No nausea, no vomiting.  No diarrhea.  No constipation. Genitourinary: Negative for dysuria. Musculoskeletal: Negative for back pain. Skin: Negative for rash. Neurological: Negative for headaches, focal weakness or numbness.   ____________________________________________   PHYSICAL EXAM:  VITAL SIGNS: ED Triage Vitals [07/12/18 1841]  Enc Vitals Group     BP 119/64     Pulse Rate 90     Resp 20     Temp 98.2 F (36.8 C)     Temp Source Oral     SpO2 97 %     Weight 168 lb (76.2 kg)     Height 5\' 5"  (1.651 m)     Head Circumference      Peak Flow      Pain Score 9     Pain Loc      Pain Edu?      Excl. in [place]?     Constitutional: Alert and oriented. Well appearing and in no acute distress. Eyes: Conjunctivae are normal.  Head: Atraumatic. Nose: No congestion/rhinnorhea. Mouth/Throat: Mucous membranes are moist.  Neck: No stridor.   Cardiovascular: Normal rate, regular rhythm. Grossly normal heart sounds.  Good peripheral circulation with equal and bilateral radial as well as posterior tibial pulses.  Chest pain is not reproducible to palpation. Respiratory: Normal respiratory effort.  No retractions. Lungs CTAB. Gastrointestinal: Soft and nontender. No distention.    Musculoskeletal: No lower extremity tenderness nor edema.  No joint effusions. Neurologic:  Normal speech and language. No gross focal neurologic deficits are appreciated. Skin:  Skin is warm, dry and intact. No rash noted. Psychiatric: Mood and affect are normal. Speech and behavior are normal.  ____________________________________________   LABS (all labs ordered are listed, but only abnormal results are displayed)  Labs Reviewed  BASIC METABOLIC PANEL - Abnormal; Notable for the following components:      Result Value   Glucose, Bld 101 (*)    All other components within normal limits  CBC  TROPONIN I  TROPONIN I   ____________________________________________  EKG  ED ECG REPORT I, Doran Stabler, the attending physician, personally  viewed and interpreted this ECG.   Date: 07/12/2018  EKG Time: 1835  Rate: 87  Rhythm: normal sinus rhythm  Axis: Normal  Intervals:none  ST&T Change: No ST segment elevation or depression.  No abnormal T wave inversion.  ____________________________________________  RADIOLOGY  Chest x-ray without acute issue ____________________________________________   PROCEDURES  Procedure(s) performed:   Procedures  Critical Care performed:   ____________________________________________   INITIAL IMPRESSION / ASSESSMENT AND PLAN / ED COURSE  Pertinent labs & imaging results that were available during my care of the patient were reviewed by me and considered in my medical decision making (see chart for details).  Differential diagnosis includes, but is not limited to, ACS, aortic dissection, pulmonary embolism, cardiac tamponade, pneumothorax, pneumonia, pericarditis, myocarditis, GI-related causes including esophagitis/gastritis, and musculoskeletal chest wall pain.   As part of my medical decision making, I reviewed the following data within the electronic MEDICAL RECORD NUMBER Notes from prior ED visits as well as previous cardiology  visits  ----------------------------------------- 9:29 PM on 07/12/2018 -----------------------------------------  Patient says that she is still in pain despite having a second negative troponin.  However, she is also stating that she is "ready to go home."  Patient without any distress at this time.  Seems to be having symptoms that she has had multiple times previously.  I recommended the patient that she follow-up with Dr. Saunders Revel for further evaluation.  Patient to return for any worsening or concerning symptoms.  She is understanding of the diagnosis as well as treatment and willing to comply.  I feel that given her work-up and her current symptoms that she is significantly low risk to be safely discharged home for outpatient follow-up.  Patient also with normal heart rate.  No pleuritic pain.  Less likely to be PE. ____________________________________________   FINAL CLINICAL IMPRESSION(S) / ED DIAGNOSES  Chest pain.  NEW MEDICATIONS STARTED DURING THIS VISIT:  New Prescriptions   No medications on file     Note:  This document was prepared using Dragon voice recognition software and may include unintentional dictation errors.     Orbie Pyo, MD 07/12/18 2130

## 2018-07-13 ENCOUNTER — Telehealth: Payer: Self-pay | Admitting: Family Medicine

## 2018-07-13 NOTE — Telephone Encounter (Signed)
Pt was made appt on 07/14/18 with [facility]. I called to reschedule with pcp per [facility] request. Appt made 07/22/18.  Pt is requesting a refill on Xanax .5mg  to get her thru that date. She said she is stressing out and has episodes where she cannot breathe and needs something to help her calm down.

## 2018-07-14 ENCOUNTER — Ambulatory Visit: Payer: Self-pay | Admitting: Internal Medicine

## 2018-07-14 MED ORDER — ALPRAZOLAM 1 MG PO TABS: 0.5000 mg | ORAL_TABLET | Freq: Two times a day (BID) | ORAL | 0 refills | Status: DC | PRN

## 2018-07-14 NOTE — Telephone Encounter (Signed)
Rebecca Allen notified by telephone that Dr. Diona Browner sent her a Rx for xanax to [facility] on [address].

## 2018-07-14 NOTE — Addendum Note (Signed)
Addended byEliezer Lofts E on: 07/14/2018 11:57 AM   Modules accepted: Orders

## 2018-07-22 ENCOUNTER — Ambulatory Visit (INDEPENDENT_AMBULATORY_CARE_PROVIDER_SITE_OTHER): Payer: 59 | Admitting: Obstetrics and Gynecology

## 2018-07-22 ENCOUNTER — Ambulatory Visit (INDEPENDENT_AMBULATORY_CARE_PROVIDER_SITE_OTHER): Payer: 59 | Admitting: Family Medicine

## 2018-07-22 ENCOUNTER — Encounter: Payer: Self-pay | Admitting: Family Medicine

## 2018-07-22 ENCOUNTER — Ambulatory Visit: Payer: Self-pay | Admitting: Family Medicine

## 2018-07-22 ENCOUNTER — Encounter: Payer: Self-pay | Admitting: Obstetrics and Gynecology

## 2018-07-22 VITALS — Ht 65.5 in

## 2018-07-22 VITALS — BP 118/70 | HR 97 | Ht 65.0 in | Wt 179.0 lb

## 2018-07-22 DIAGNOSIS — Z90722 Acquired absence of ovaries, bilateral: Secondary | ICD-10-CM

## 2018-07-22 DIAGNOSIS — Z124 Encounter for screening for malignant neoplasm of cervix: Secondary | ICD-10-CM

## 2018-07-22 DIAGNOSIS — Z01419 Encounter for gynecological examination (general) (routine) without abnormal findings: Secondary | ICD-10-CM | POA: Diagnosis not present

## 2018-07-22 DIAGNOSIS — F411 Generalized anxiety disorder: Secondary | ICD-10-CM

## 2018-07-22 DIAGNOSIS — E8941 Symptomatic postprocedural ovarian failure: Secondary | ICD-10-CM

## 2018-07-22 DIAGNOSIS — M546 Pain in thoracic spine: Secondary | ICD-10-CM

## 2018-07-22 DIAGNOSIS — N6320 Unspecified lump in the left breast, unspecified quadrant: Secondary | ICD-10-CM

## 2018-07-22 MED ORDER — ESTRADIOL 0.05 MG/24HR TD PTTW: MEDICATED_PATCH | TRANSDERMAL | 11 refills | Status: DC

## 2018-07-22 NOTE — Progress Notes (Signed)
Gynecology Annual Exam  PCP: Jinny Sanders, MD  Chief Complaint:  Chief Complaint  Patient presents with  . Gynecologic Exam    Cyst in breast     History of Present Illness:Patient is a 52 y.o. Rebecca Allen presents for annual exam. The patient has no complaints today.   LMP: No LMP recorded. Patient has had a hysterectomy. Menarche:not applicable Postcoital Bleeding: no  The patient is sexually active. She denies dyspareunia.  The patient does perform self breast exams.  There is no notable family history of breast or ovarian cancer in her family.  The patient wears seatbelts: yes.   The patient has regular exercise: not asked.    The patient denies current symptoms of depression.     Review of Systems: Review of Systems  Constitutional: Negative for chills, fever, malaise/fatigue and weight loss.  HENT: Negative for congestion, hearing loss and sinus pain.   Eyes: Negative for blurred vision and double vision.  Respiratory: Negative for cough, sputum production, shortness of breath and wheezing.   Cardiovascular: Negative for chest pain, palpitations, orthopnea and leg swelling.  Gastrointestinal: Negative for abdominal pain, constipation, diarrhea, nausea and vomiting.  Genitourinary: Negative for dysuria, flank pain, frequency, hematuria and urgency.  Musculoskeletal: Negative for back pain, falls and joint pain.  Skin: Negative for itching and rash.  Neurological: Negative for dizziness and headaches.  Psychiatric/Behavioral: Negative for depression, substance abuse and suicidal ideas. The patient is not nervous/anxious.     Past Medical History:  Past Medical History:  Diagnosis Date  . Depression   . GERD (gastroesophageal reflux disease)   . Hyperlipidemia   . Treadmill stress test negative for angina pectoris 12/18/2010    Past Surgical History:  Past Surgical History:  Procedure Laterality Date  . ABDOMINAL HYSTERECTOMY    . CHOLECYSTECTOMY    .  COLONOSCOPY    . PARTIAL HYSTERECTOMY  08-1999   abdominal    Gynecologic History:  No LMP recorded. Patient has had a hysterectomy. Last mammogram: 06/02/18 Results were: BI-RAD III  Obstetric History: [id number]  Family History:  Family History  Problem Relation Age of Onset  . COPD Mother   . Heart attack Father 31  . Hypertension Father   . Heart disease Father        stent placement   . Leukemia Brother   . Kidney failure Maternal Grandfather   . COPD Paternal Grandmother   . Congenital heart disease Sister   . Breast cancer Neg Hx     Social History:  Social History   Socioeconomic History  . Marital status: Married    Spouse name: Not on file  . Number of children: Not on file  . Years of education: Not on file  . Highest education level: Not on file  Occupational History  . Occupation: Research scientist (life sciences): [employer]    Comment: Urgent Care in [place]  . Financial resource strain: Not on file  . Food insecurity:    Worry: Not on file    Inability: Not on file  . Transportation needs:    Medical: Not on file    Non-medical: Not on file  Tobacco Use  . Smoking status: Current Every Day Smoker    Packs/day: 0.25    Years: 30.00    Pack years: 7.50    Types: Cigarettes  . Smokeless tobacco: Never Used  Substance and Sexual Activity  . Alcohol use: Yes  Alcohol/week: 2.0 standard drinks    Types: 1 Glasses of wine, 1 Cans of beer per week    Comment: beer ocassionally  . Drug use: No  . Sexual activity: Yes    Birth control/protection: Surgical  Lifestyle  . Physical activity:    Days per week: Not on file    Minutes per session: Not on file  . Stress: Not on file  Relationships  . Social connections:    Talks on phone: Not on file    Gets together: Not on file    Attends religious service: Not on file    Active member of club or organization: Not on file    Attends meetings of clubs or organizations: Not on file      Relationship status: Not on file  . Intimate partner violence:    Fear of current or ex partner: Not on file    Emotionally abused: Not on file    Physically abused: Not on file    Forced sexual activity: Not on file  Other Topics Concern  . Not on file  Social History Narrative   Regular exercise- yes, occ walking   Diet- fruits and veggies, water     Allergies:  Allergies  Allergen Reactions  . Metoclopramide Hcl     REACTION: Nervousness  . Morphine And Related Nausea Only  . Penicillins     REACTION: Upsets stomach    Medications: Prior to Admission medications   Medication Sig Start Date End Date Taking? Authorizing Provider  ALPRAZolam Duanne Moron) 1 MG tablet Take 0.5 tablets (0.5 mg total) by mouth 2 (two) times daily as needed for anxiety. 07/14/18  Yes Bedsole, Amy E, MD  aspirin 81 MG tablet Take 81 mg by mouth daily.   Yes [provider]  calcium carbonate (TUMS - DOSED IN MG ELEMENTAL CALCIUM) 500 MG chewable tablet Chew 6 tablets by mouth daily as needed for indigestion or heartburn.   Yes [provider]  cyclobenzaprine (FLEXERIL) 5 MG tablet Take 1 tablet (5 mg total) by mouth at bedtime as needed for muscle spasms. 06/23/18  Yes Venia Carbon, MD  estradiol (VIVELLE-DOT) 0.05 MG/24HR patch Place 1 patch trans-dermally twice a week. Remove old patch before applying new patch. 08/26/17  Yes Rod Can, CNM  KRILL OIL OMEGA-3 PO Take by mouth daily.   Yes [provider]  pantoprazole (PROTONIX) 40 MG tablet Take 40 mg by mouth daily.   Yes [provider]    Physical Exam Vitals: Blood pressure 118/70, pulse 97, height 5\' 5"  (1.651 m), weight 179 lb (81.2 kg).  General: NAD HEENT: normocephalic, anicteric Thyroid: no enlargement, no palpable nodules Pulmonary: No increased work of breathing, CTAB Cardiovascular: RRR, distal pulses 2+ Breast: Breast symmetrical, no tenderness, no palpable nodules or masses, no skin or  nipple retraction present, no nipple discharge.  No axillary or supraclavicular lymphadenopathy. Abdomen: NABS, soft, non-tender, non-distended.  Umbilicus without lesions.  No hepatomegaly, splenomegaly or masses palpable. No evidence of hernia  Genitourinary:  External: Normal external female genitalia.  Normal urethral meatus, normal Bartholin's and Skene's glands.    Vagina: Normal vaginal mucosa, no evidence of prolapse.    Cervix: Grossly normal in appearance, no bleeding  Uterus: Non-enlarged, mobile, normal contour.  No CMT  Adnexa: ovaries non-enlarged, no adnexal masses  Rectal: deferred  Lymphatic: no evidence of inguinal lymphadenopathy Extremities: no edema, erythema, or tenderness Neurologic: Grossly intact Psychiatric: mood appropriate, affect full  Female chaperone present for  pelvic and breast  portions of the physical exam     Assessment: 52 y.o. [id number] routine annual exam  Plan: Problem List Items Addressed This Visit    None    Visit Diagnoses    Left breast mass    -  Primary   Relevant Orders   MM DIAG BREAST TOMO UNI LEFT   US BREAST LTD UNI LEFT INC AXILLA   Screening for cervical cancer       Relevant Orders   Cytology - PAP      1) Mammogram - recommend yearly screening mammogram.  Mammogram follow up  Was ordered today  2) STI screening  wasoffered and declined  3) ASCCP guidelines and rational discussed.  Patient opts for every 3 years screening interval  4) Osteoporosis  - per USPTF routine screening DEXA at age 86 - FRAX 49 year major fracture risk 3.4,  10 year hip fracture risk 0.  Consider FDA-approved medical therapies in postmenopausal women and men aged 18 years and older, based on the following: a) A hip or vertebral (clinical or morphometric) fracture b) T-score ? -2.5 at the femoral neck or spine after appropriate evaluation to exclude secondary causes C) Low bone mass (T-score between -1.0 and -2.5 at the femoral neck or spine)  and a 10-year probability of a hip fracture ? 3% or a 10-year probability of a major osteoporosis-related fracture ? Rebecca% based on the [place]-adapted WHO algorithm   5) Routine healthcare maintenance including cholesterol, diabetes screening discussed managed by PCP  6) Colonoscopy was performed about 3 years ago per patient, encouraged her to call to follow up if she needed another one  7) Referral to breast surgeon for reduction  8) Return in about 1 year (around 07/23/2019) for Annual.   Adrian Prows MD [place] 07/22/18 10:40 AM

## 2018-07-25 ENCOUNTER — Telehealth: Payer: Self-pay

## 2018-07-25 NOTE — Progress Notes (Signed)
MD running behind.. pt left.. no charge

## 2018-07-25 NOTE — Telephone Encounter (Signed)
Pt calling to see if estrogen patch was sent in to pharm.  [phone]  Adv it was sent 12/13th at 11am.

## 2018-07-28 ENCOUNTER — Other Ambulatory Visit: Payer: Self-pay | Admitting: Internal Medicine

## 2018-07-28 LAB — PAP IG AND HPV HIGH-RISK: HPV, high-risk: NEGATIVE

## 2018-07-29 NOTE — Progress Notes (Signed)
Please call this patient and let her know that her pap smear has to be repeated. Thank you,  Dr. Gilman Schmidt

## 2018-08-11 NOTE — Progress Notes (Signed)
Pt is aware, she said she will call back to set up appt when her next schedule comes out.

## 2018-08-12 ENCOUNTER — Ambulatory Visit: Payer: 59 | Admitting: Family Medicine

## 2018-08-18 ENCOUNTER — Encounter: Payer: Self-pay | Admitting: Family Medicine

## 2018-08-18 ENCOUNTER — Ambulatory Visit (INDEPENDENT_AMBULATORY_CARE_PROVIDER_SITE_OTHER): Payer: 59 | Admitting: Family Medicine

## 2018-08-18 VITALS — BP 120/82 | HR 99 | Temp 98.6°F | Ht 65.5 in | Wt 178.2 lb

## 2018-08-18 DIAGNOSIS — M940 Chondrocostal junction syndrome [Tietze]: Secondary | ICD-10-CM

## 2018-08-18 DIAGNOSIS — F411 Generalized anxiety disorder: Secondary | ICD-10-CM | POA: Diagnosis not present

## 2018-08-18 DIAGNOSIS — F410 Panic disorder [episodic paroxysmal anxiety] without agoraphobia: Secondary | ICD-10-CM | POA: Diagnosis not present

## 2018-08-18 MED ORDER — VENLAFAXINE HCL ER 37.5 MG PO CP24: ORAL_CAPSULE | ORAL | 1 refills | Status: DC

## 2018-08-18 NOTE — Progress Notes (Signed)
Subjective:    Patient ID: Rebecca Allen, female    DOB: 01/15/1966, 53 y.o.   MRN: [id number]  HPI  53 year old female presents to follow up on ER visit on  07/12/2018 for anxiety.  Stabbing chest pain and SOB, weakness and blurred vision  Neg troponin, unremarkable EKG   Dr. END in cardiologist.but takes if severe.. once a week.  She has been very anxious in last 1-2 months.   She has intermittent chest pain from stress. She is very irritable and angry at home as well as at work. She has been very shaky, jittery.  Cannot sit still. Cannot funtion at work, cannot concentrated.  Poor sleep, able to fall asleep but not stay asleep.  some depression as well.   She reports very high level of stress.  She is very anxious and tearful at home.  Her job in contributing to stress.  She does not want to take alprazolam  ... helps temporarily.   GAD7: 19  PHQ9:  18  She has had weight gain with stress.  Wt Readings from Last 3 Encounters:  08/18/18 178 lb 4 oz (80.9 kg)  07/22/18 179 lb (81.2 kg)  07/12/18 168 lb (76.2 kg)    Social History /Family History/Past Medical History reviewed in detail and updated in EMR if needed. Blood pressure 120/82, pulse 99, temperature 98.6 F (37 C), temperature source Oral, height 5' 5.5" (1.664 m), weight 178 lb 4 oz (80.9 kg).  Review of Systems  Constitutional: Negative for fatigue and fever.  HENT: Negative for congestion.   Eyes: Negative for pain.  Respiratory: Negative for cough and shortness of breath.   Cardiovascular: Negative for chest pain, palpitations and leg swelling.  Gastrointestinal: Negative for abdominal pain.  Genitourinary: Negative for dysuria and vaginal bleeding.  Musculoskeletal: Negative for back pain.  Neurological: Negative for syncope, light-headedness and headaches.  Psychiatric/Behavioral: Positive for dysphoric mood and sleep disturbance. Negative for suicidal ideas. The patient is nervous/anxious.          Objective:   Physical Exam Constitutional:      General: She is not in acute distress.    Appearance: Normal appearance. She is well-developed. She is not ill-appearing or toxic-appearing.  HENT:     Head: Normocephalic.     Right Ear: Hearing, tympanic membrane, ear canal and external ear normal. Tympanic membrane is not erythematous, retracted or bulging.     Left Ear: Hearing, tympanic membrane, ear canal and external ear normal. Tympanic membrane is not erythematous, retracted or bulging.     Nose: No mucosal edema or rhinorrhea.     Right Sinus: No maxillary sinus tenderness or frontal sinus tenderness.     Left Sinus: No maxillary sinus tenderness or frontal sinus tenderness.     Mouth/Throat:     Pharynx: Uvula midline.  Eyes:     General: Lids are normal. Lids are everted, no foreign bodies appreciated.     Conjunctiva/sclera: Conjunctivae normal.     Pupils: Pupils are equal, round, and reactive to light.  Neck:     Musculoskeletal: Normal range of motion and neck supple.     Thyroid: No thyroid mass or thyromegaly.     Vascular: No carotid bruit.     Trachea: Trachea normal.  Cardiovascular:     Rate and Rhythm: Normal rate and regular rhythm.     Pulses: Normal pulses.     Heart sounds: Normal heart sounds, S1 normal and S2 normal. No  murmur. No friction rub. No gallop.   Pulmonary:     Effort: Pulmonary effort is normal. No tachypnea or respiratory distress.     Breath sounds: Normal breath sounds. No decreased breath sounds, wheezing, rhonchi or rales.  Abdominal:     General: Bowel sounds are normal.     Palpations: Abdomen is soft.     Tenderness: There is no abdominal tenderness.  Skin:    General: Skin is warm and dry.     Findings: No rash.  Neurological:     Mental Status: She is alert.  Psychiatric:        Attention and Perception: Attention normal.        Mood and Affect: Mood is anxious. Mood is not depressed.        Speech: Speech is rapid  and pressured.        Behavior: Behavior is agitated. Behavior is cooperative.        Thought Content: Thought content normal. Thought content does not include homicidal or suicidal plan.        Cognition and Memory: Cognition normal.        Judgment: Judgment normal.           Assessment & Plan:

## 2018-08-18 NOTE — Assessment & Plan Note (Signed)
No clear current cardiac source of pain... may be due to stress/anxiety or costochondritis.

## 2018-08-18 NOTE — Patient Instructions (Addendum)
Start venlafaxine daily at bedtime, if no side effects increase to 2 tabs at bedtime after 1 week.  Can use the alprazolam as needed for panic attack.  Call for appointment with Bambi.  If still having a lot of trouble sleep.. call and we can try trazodone for sleep.

## 2018-08-18 NOTE — Assessment & Plan Note (Addendum)
Start venlafaxine daily.  May need trazodone at night... if not sleeping better will call in.  Refer back to counseling.  Can use alprazolam prn.   given unable to function, interact with clients at work.. will take pt out of work for 2 weeks until follow up in office to given medication and counseling time to work. Pt agreeable.

## 2018-08-23 ENCOUNTER — Telehealth: Payer: Self-pay | Admitting: Family Medicine

## 2018-08-23 DIAGNOSIS — Z0279 Encounter for issue of other medical certificate: Secondary | ICD-10-CM

## 2018-08-23 NOTE — Telephone Encounter (Signed)
Paperwork faxed  Pt aware gave verbal request to send last office note with paperwork

## 2018-08-23 NOTE — Telephone Encounter (Signed)
[facility] faxed attending provider statement to be filled out In dr Diona Browner in box for review and signature

## 2018-08-23 NOTE — Telephone Encounter (Signed)
Complete in outbox. Please attack OV note

## 2018-08-23 NOTE — Telephone Encounter (Signed)
[facility] faxed mental health evaluation In dr Diona Browner in box for review and signature

## 2018-08-24 ENCOUNTER — Telehealth: Payer: Self-pay

## 2018-08-24 MED ORDER — SERTRALINE HCL 50 MG PO TABS: 50.0000 mg | ORAL_TABLET | Freq: Every day | ORAL | 3 refills | Status: DC

## 2018-08-24 NOTE — Telephone Encounter (Signed)
Stop venlafaxine and change to sertraline 50 mg at bedtime.

## 2018-08-24 NOTE — Telephone Encounter (Signed)
Pt seen 08/18/17 and given Venlafaxine XR 37.5 mg taking one at hs. Pt did not take Venlafaxine last night. Pt said after starting medication had hot flashes, clamminess, and nausea. Pt feels some better this AM. Pt wants to go back on Zoloft which has taken before and pt did not have side effects. Pt request cb. [facility].

## 2018-08-25 NOTE — Telephone Encounter (Signed)
Copy for pt Copy for scan Copy for billing

## 2018-08-26 DIAGNOSIS — Z0279 Encounter for issue of other medical certificate: Secondary | ICD-10-CM

## 2018-08-26 NOTE — Telephone Encounter (Signed)
Spoke to pt and advised. States she will pickup from pharmacy and begin taking as directed.

## 2018-08-29 ENCOUNTER — Telehealth: Payer: Self-pay

## 2018-08-29 NOTE — Telephone Encounter (Signed)
Pt had annual done and refill of generic vivelle patch 0.05mg  was supposed to have been sent to [facility].  [phone] Adv refills were eRX'd on the 13th.

## 2018-09-01 ENCOUNTER — Ambulatory Visit: Payer: 59 | Admitting: Family Medicine

## 2018-09-01 ENCOUNTER — Encounter: Payer: Self-pay | Admitting: *Deleted

## 2018-09-01 ENCOUNTER — Telehealth: Payer: Self-pay | Admitting: Family Medicine

## 2018-09-01 VITALS — BP 102/70 | HR 78 | Temp 97.7°F | Ht 65.5 in | Wt 175.0 lb

## 2018-09-01 DIAGNOSIS — F411 Generalized anxiety disorder: Secondary | ICD-10-CM

## 2018-09-01 DIAGNOSIS — F321 Major depressive disorder, single episode, moderate: Secondary | ICD-10-CM

## 2018-09-01 DIAGNOSIS — F410 Panic disorder [episodic paroxysmal anxiety] without agoraphobia: Secondary | ICD-10-CM

## 2018-09-01 NOTE — Telephone Encounter (Signed)
Patient was seen today and taken out of work until 09/15/18.  Patient needs a note for work. Please call patient when work note is ready for pick up.

## 2018-09-01 NOTE — Progress Notes (Signed)
Subjective:    Patient ID: Rebecca Allen, female    DOB: [birth year], 53 y.o.   MRN: [id number]  HPI  53 year old female presents for follow up anxiety, worsening   She did not tolerate venlafaxine. Has nausea.  She has now been on sertraline 50 mg... on it for 2 days. No improvement.Marland Kitchen overall worsening from last OV.   She has not been  able to set up  Counselor.. needs referral.  PHQ9: 22/27 GAD7: 20/21.   Using alprazolam 1/4 every other day.   She is not able to return to work. Trouble interacting with patients and coworkers. Unable to have good interaction with patients to drw blood. Very angry, irritable.. unable to handle conflict, lashing out. Feels jittery. Cannot draw blood.  Cannot focus and concentrated to perform tasks.  Decreased memory/foggy thinking.. unable to remember tasks to complete, logging order   Social History /Family History/Past Medical History reviewed in detail and updated in EMR if needed. Blood pressure 102/70, pulse 78, temperature 97.7 F (36.5 C), temperature source Oral, height 5' 5.5" (1.664 m), weight 175 lb (79.4 kg), SpO2 98 %.  Review of Systems  Constitutional: Negative for fatigue and fever.  HENT: Negative for congestion.   Eyes: Negative for pain.  Respiratory: Negative for cough and shortness of breath.   Cardiovascular: Negative for chest pain, palpitations and leg swelling.  Gastrointestinal: Negative for abdominal pain.  Genitourinary: Negative for dysuria and vaginal bleeding.  Musculoskeletal: Negative for back pain.  Neurological: Negative for syncope, light-headedness and headaches.  Psychiatric/Behavioral: Negative for dysphoric mood.       Objective:   Physical Exam Constitutional:      General: She is not in acute distress.    Appearance: Normal appearance. She is well-developed. She is not ill-appearing or toxic-appearing.  HENT:     Head: Normocephalic.     Right Ear: Hearing, tympanic membrane, ear canal  and external ear normal. Tympanic membrane is not erythematous, retracted or bulging.     Left Ear: Hearing, tympanic membrane, ear canal and external ear normal. Tympanic membrane is not erythematous, retracted or bulging.     Nose: No mucosal edema or rhinorrhea.     Right Sinus: No maxillary sinus tenderness or frontal sinus tenderness.     Left Sinus: No maxillary sinus tenderness or frontal sinus tenderness.     Mouth/Throat:     Pharynx: Uvula midline.  Eyes:     General: Lids are normal. Lids are everted, no foreign bodies appreciated.     Conjunctiva/sclera: Conjunctivae normal.     Pupils: Pupils are equal, round, and reactive to light.  Neck:     Musculoskeletal: Normal range of motion and neck supple.     Thyroid: No thyroid mass or thyromegaly.     Vascular: No carotid bruit.     Trachea: Trachea normal.  Cardiovascular:     Rate and Rhythm: Normal rate and regular rhythm.     Pulses: Normal pulses.     Heart sounds: Normal heart sounds, S1 normal and S2 normal. No murmur. No friction rub. No gallop.   Pulmonary:     Effort: Pulmonary effort is normal. No tachypnea or respiratory distress.     Breath sounds: Normal breath sounds. No decreased breath sounds, wheezing, rhonchi or rales.  Abdominal:     General: Bowel sounds are normal.     Palpations: Abdomen is soft.     Tenderness: There is no abdominal tenderness.  Skin:  General: Skin is warm and dry.     Findings: No rash.  Neurological:     Mental Status: She is alert.  Psychiatric:        Mood and Affect: Mood is anxious. Mood is not depressed.        Speech: Speech is rapid and pressured.        Behavior: Behavior is agitated. Behavior is cooperative.        Thought Content: Thought content does not include homicidal or suicidal ideation. Thought content does not include homicidal or suicidal plan.        Cognition and Memory: Memory is impaired.        Judgment: Judgment normal.           Assessment  & Plan:

## 2018-09-01 NOTE — Patient Instructions (Signed)
Please stop at the front desk to set up referral.  Continue sertraline 50 mg daily.

## 2018-09-01 NOTE — Telephone Encounter (Signed)
Tish notified that letter is ready to be picked up at the front desk.

## 2018-09-02 ENCOUNTER — Ambulatory Visit: Payer: 59 | Admitting: Advanced Practice Midwife

## 2018-09-02 ENCOUNTER — Telehealth: Payer: Self-pay | Admitting: Family Medicine

## 2018-09-02 NOTE — Telephone Encounter (Signed)
[facility] faxed Mental health evaluation follow up paperwork In dr Diona Browner in box

## 2018-09-05 NOTE — Telephone Encounter (Signed)
Paperwork faxed 1/20 Copy for pt Copy for scan Copy for billing

## 2018-09-06 DIAGNOSIS — Z0279 Encounter for issue of other medical certificate: Secondary | ICD-10-CM

## 2018-09-06 DIAGNOSIS — F321 Major depressive disorder, single episode, moderate: Secondary | ICD-10-CM | POA: Insufficient documentation

## 2018-09-06 NOTE — Assessment & Plan Note (Signed)
Poor control .Marland Kitchen SSRI will help with this also.

## 2018-09-06 NOTE — Assessment & Plan Note (Signed)
Intolerant of venlafaxine.  Now on sertraline.. will take 3-4 week to have an effect.  Referred again to counselor as was unable to set up to preferred counselor.  Stress reduction, relaxation techniques reviewed.  Can use alprazolam on limited basis  Remain out of work until follow up.

## 2018-09-08 NOTE — Telephone Encounter (Signed)
Paperwork faxed Pt aware Copy for pt Copy for scan Copy for billing

## 2018-09-09 ENCOUNTER — Institutional Professional Consult (permissible substitution): Payer: Self-pay | Admitting: Plastic Surgery

## 2018-09-13 ENCOUNTER — Encounter: Payer: Self-pay | Admitting: Family Medicine

## 2018-09-13 ENCOUNTER — Ambulatory Visit: Payer: 59 | Admitting: Family Medicine

## 2018-09-13 VITALS — BP 100/60 | HR 79 | Temp 97.7°F | Ht 65.5 in | Wt 177.0 lb

## 2018-09-13 DIAGNOSIS — F411 Generalized anxiety disorder: Secondary | ICD-10-CM

## 2018-09-13 DIAGNOSIS — F410 Panic disorder [episodic paroxysmal anxiety] without agoraphobia: Secondary | ICD-10-CM | POA: Diagnosis not present

## 2018-09-13 DIAGNOSIS — M5441 Lumbago with sciatica, right side: Secondary | ICD-10-CM

## 2018-09-13 DIAGNOSIS — R5383 Other fatigue: Secondary | ICD-10-CM | POA: Diagnosis not present

## 2018-09-13 MED ORDER — MELOXICAM 15 MG PO TABS: 15.0000 mg | ORAL_TABLET | Freq: Every day | ORAL | 0 refills | Status: DC

## 2018-09-13 MED ORDER — DULOXETINE HCL 30 MG PO CPEP: 30.0000 mg | ORAL_CAPSULE | Freq: Every day | ORAL | 3 refills | Status: DC

## 2018-09-13 MED ORDER — TRAZODONE HCL 50 MG PO TABS: 25.0000 mg | ORAL_TABLET | Freq: Every evening | ORAL | 3 refills | Status: DC | PRN

## 2018-09-13 NOTE — Patient Instructions (Addendum)
Stop sertraline.  Start Cymbalta at bedtime 30 mg daily, if tolerated can increase after 1 week to 60 mg at bedtime.  Can use 1/2 tab of trazodone at bedtime if needed.  Keep appt for counseling.  Start meloxicam for right sciatica... heat, gentle stretching, massage.  Hold muscle relaxant for now.

## 2018-09-13 NOTE — Assessment & Plan Note (Signed)
Poor control. Pt continues to have no improvement.  Will start counseling on 2/19.  Stop sertrlaine given SE of fatigue.  Start Cymbalta.  Can use trazodone for sleep at night as poor sleep likely contributing to symptoms and in ability to function at work.,

## 2018-09-13 NOTE — Assessment & Plan Note (Signed)
Treat with meloxicam given ( stomach SE to NSAIDS in past). If not improving consider pred taper... but will hold given mood issue and possible SE.  Start home stretching.. continue heat and massage.

## 2018-09-13 NOTE — Progress Notes (Signed)
Subjective:    Patient ID: Rebecca Allen, female    DOB: [birth year], 53 y.o.   MRN: [id number]  HPI  53 year old female patient presents for follow up GAD and panic atttack.   She reports now after being on  50 mg sertraline for 2 weeks, she has noted minimal improvement in her mood.  She remains anxious.  Cannot sleep through the night... she gets up multiple times.. is constantly worrying... wakes at 2 AM, trouble getting back to sleep. Wakes tired not rested. She has been having panic attacks..  Once a week.    She has first counseling appt on 2/19.Marland Kitchen not able to get her in earlier.   SE to effexor.  GAD 7 no better: 23  Remain out of work until follow up and reassessment her on 2/25.    Right  Low back pain, constant.Marland Kitchen on right buttock and right lower back. Radiates to behind knee.... flared up over the weekends.  No numbness or tingling in leg.  She has been using muscle relaxer prn. Moist heat. Ibuprofen upsets her stomach. Trying to walk more.  Social History /Family History/Past Medical History reviewed in detail and updated in EMR if needed. Cecil Cranker   Review of Systems  Constitutional: Negative for fatigue and fever.  HENT: Negative for congestion.   Eyes: Negative for pain.  Respiratory: Negative for cough and shortness of breath.   Cardiovascular: Negative for chest pain, palpitations and leg swelling.  Gastrointestinal: Negative for abdominal pain.  Genitourinary: Negative for dysuria and vaginal bleeding.  Musculoskeletal: Negative for back pain.  Neurological: Negative for syncope, light-headedness and headaches.  Psychiatric/Behavioral: Negative for dysphoric mood.       Objective:   Physical Exam Constitutional:      General: She is not in acute distress.    Appearance: Normal appearance. She is well-developed. She is not ill-appearing or toxic-appearing.  HENT:     Head: Normocephalic.     Right Ear: Hearing, tympanic membrane, ear canal  and external ear normal. Tympanic membrane is not erythematous, retracted or bulging.     Left Ear: Hearing, tympanic membrane, ear canal and external ear normal. Tympanic membrane is not erythematous, retracted or bulging.     Nose: No mucosal edema or rhinorrhea.     Right Sinus: No maxillary sinus tenderness or frontal sinus tenderness.     Left Sinus: No maxillary sinus tenderness or frontal sinus tenderness.     Mouth/Throat:     Pharynx: Uvula midline.  Eyes:     General: Lids are normal. Lids are everted, no foreign bodies appreciated.     Conjunctiva/sclera: Conjunctivae normal.     Pupils: Pupils are equal, round, and reactive to light.  Neck:     Musculoskeletal: Normal range of motion and neck supple.     Thyroid: No thyroid mass or thyromegaly.     Vascular: No carotid bruit.     Trachea: Trachea normal.  Cardiovascular:     Rate and Rhythm: Normal rate and regular rhythm.     Pulses: Normal pulses.     Heart sounds: Normal heart sounds, S1 normal and S2 normal. No murmur. No friction rub. No gallop.   Pulmonary:     Effort: Pulmonary effort is normal. No tachypnea or respiratory distress.     Breath sounds: Normal breath sounds. No decreased breath sounds, wheezing, rhonchi or rales.  Abdominal:     General: Bowel sounds are normal.     Palpations: Abdomen  is soft.     Tenderness: There is no abdominal tenderness.  Musculoskeletal:     Lumbar back: She exhibits decreased range of motion, tenderness and bony tenderness.     Comments: Pos SLR on right  Skin:    General: Skin is warm and dry.     Findings: No rash.  Neurological:     Mental Status: She is alert.  Psychiatric:        Attention and Perception: She is inattentive.        Mood and Affect: Mood is anxious. Mood is not depressed.        Speech: Speech is rapid and pressured.        Behavior: Behavior is agitated. Behavior is cooperative.        Thought Content: Thought content normal. Thought content is  not delusional. Thought content does not include homicidal or suicidal plan.        Cognition and Memory: Memory is impaired.        Judgment: Judgment normal.           Assessment & Plan:

## 2018-09-14 ENCOUNTER — Telehealth: Payer: Self-pay | Admitting: Family Medicine

## 2018-09-14 NOTE — Telephone Encounter (Signed)
[facility] faxed attending provider statement In dr Diona Browner in box for review and signature

## 2018-09-14 NOTE — Telephone Encounter (Signed)
Dr Diona Browner  There are 2 forms 1) attending provider statement 2) mental health evaluation

## 2018-09-16 ENCOUNTER — Ambulatory Visit: Payer: Self-pay | Admitting: Family Medicine

## 2018-09-16 DIAGNOSIS — Z0279 Encounter for issue of other medical certificate: Secondary | ICD-10-CM

## 2018-09-19 NOTE — Telephone Encounter (Signed)
Paperwork faxed °

## 2018-09-20 NOTE — Telephone Encounter (Signed)
Pt aware Copy for pt Copy for billing Copy for scan

## 2018-09-24 ENCOUNTER — Other Ambulatory Visit: Payer: Self-pay | Admitting: Family Medicine

## 2018-09-26 NOTE — Telephone Encounter (Signed)
Last office visit 09/13/2018 for GAD.  Last refilled 09/13/2018 for #15 with no refills.  Next Appt: 10/04/2018.

## 2018-09-28 ENCOUNTER — Ambulatory Visit: Payer: 59 | Admitting: Psychology

## 2018-09-28 DIAGNOSIS — F410 Panic disorder [episodic paroxysmal anxiety] without agoraphobia: Secondary | ICD-10-CM

## 2018-09-30 ENCOUNTER — Encounter: Payer: Self-pay | Admitting: Plastic Surgery

## 2018-09-30 ENCOUNTER — Ambulatory Visit: Payer: 59 | Admitting: Plastic Surgery

## 2018-09-30 VITALS — BP 107/71 | HR 72 | Temp 98.1°F | Ht 65.5 in | Wt 177.0 lb

## 2018-09-30 DIAGNOSIS — N62 Hypertrophy of breast: Secondary | ICD-10-CM | POA: Insufficient documentation

## 2018-09-30 DIAGNOSIS — G8929 Other chronic pain: Secondary | ICD-10-CM | POA: Diagnosis not present

## 2018-09-30 DIAGNOSIS — M542 Cervicalgia: Secondary | ICD-10-CM | POA: Diagnosis not present

## 2018-09-30 DIAGNOSIS — M546 Pain in thoracic spine: Secondary | ICD-10-CM

## 2018-09-30 NOTE — Progress Notes (Addendum)
Patient ID: Rebecca Allen, female    DOB: [birth year], 53 y.o.   MRN: [id number]   Chief Complaint  Patient presents with  . Breast Problem    Mammary Hyperplasia: The patient is a 53 y.o. female with a history of mammary hyperplasia for several years.  She has extremely large breasts causing symptoms that include the following: Back pain (upper and lower) and neck pain. She frequently pins bra cups higher on straps for better lift and relief. Notices relief when holding breast up in her hands. Shoulder straps causing grooves, pain occasionally requiring padding. Pain medication is sometimes required with motrin and tylenol.  Activities that are hindered by enlarged breasts include: running and exercising.  Her breasts are extremely large and fairly symmetric.  She has hyperpigmentation of the inframammary area on both sides.  The sternal to nipple distance on the right is 32 cm and the left is 33 cm.  The IMF distance is 9 cm.  She is 5 feet 5 inches tall and weighs 177 pounds.  Preoperative bra size = 38DD cup.  The estimated excess breast tissue to be removed at the time of surgery = 550 grams on the left and 550 grams on the right.  Mammogram history: Due for mammogram in next month.  She uses the tanning bed several times a week.  She is also smoking. She would like to be a B/C cup.   Review of Systems  Constitutional: Positive for activity change. Negative for appetite change.  HENT: Negative.   Eyes: Negative.   Respiratory: Negative.   Cardiovascular: Negative.   Gastrointestinal: Negative.   Endocrine: Negative.   Genitourinary: Negative.   Musculoskeletal: Positive for back pain and neck pain.  Skin: Negative for color change and wound.  Neurological: Negative.   Hematological: Negative.   Psychiatric/Behavioral: Negative.     Past Medical History:  Diagnosis Date  . Depression   . GERD (gastroesophageal reflux disease)   . Hyperlipidemia   . Treadmill stress  test negative for angina pectoris 12/18/2010    Past Surgical History:  Procedure Laterality Date  . ABDOMINAL HYSTERECTOMY    . CHOLECYSTECTOMY    . COLONOSCOPY    . PARTIAL HYSTERECTOMY  08-1999   abdominal      Current Outpatient Medications:  .  ALPRAZolam (XANAX) 1 MG tablet, Take 0.5 tablets (0.5 mg total) by mouth 2 (two) times daily as needed for anxiety., Disp: 30 tablet, Rfl: 0 .  calcium carbonate (TUMS - DOSED IN MG ELEMENTAL CALCIUM) 500 MG chewable tablet, Chew 6 tablets by mouth daily as needed for indigestion or heartburn., Disp: , Rfl:  .  DULoxetine (CYMBALTA) 30 MG capsule, Take 1 capsule (30 mg total) by mouth daily., Disp: 30 capsule, Rfl: 3 .  estradiol (VIVELLE-DOT) 0.05 MG/24HR patch, Place 1 patch trans-dermally twice a week. Remove old patch before applying new patch., Disp: 8 patch, Rfl: 11 .  meloxicam (MOBIC) 15 MG tablet, TAKE 1 TABLET BY MOUTH EVERY DAY, Disp: 30 tablet, Rfl: 0 .  pantoprazole (PROTONIX) 40 MG tablet, Take 40 mg by mouth daily., Disp: , Rfl:  .  traZODone (DESYREL) 50 MG tablet, Take 0.5-1 tablets (25-50 mg total) by mouth at bedtime as needed for sleep., Disp: 30 tablet, Rfl: 3   Objective:   Vitals:   09/30/18 1057  BP: 107/71  Pulse: 72  Temp: 98.1 F (36.7 C)  SpO2: 96%    Physical Exam Vitals signs and nursing note  reviewed.  Constitutional:      Appearance: Normal appearance.  HENT:     Head: Normocephalic and atraumatic.     Nose: Nose normal.  Eyes:     Extraocular Movements: Extraocular movements intact.     Pupils: Pupils are equal, round, and reactive to light.  Cardiovascular:     Rate and Rhythm: Normal rate.  Pulmonary:     Effort: Pulmonary effort is normal.  Abdominal:     General: Abdomen is flat. There is no distension.     Tenderness: There is no abdominal tenderness.  Skin:    General: Skin is warm.  Neurological:     General: No focal deficit present.     Mental Status: She is alert.  Psychiatric:         Mood and Affect: Mood normal.        Behavior: Behavior normal.        Thought Content: Thought content normal.        Judgment: Judgment normal.     Assessment & Plan:  Neck pain  Symptomatic mammary hypertrophy  Chronic bilateral thoracic back pain  Recommend bilateral breast reduction with liposuction.  Patient will need to be tobacco free for a minimum of 6 weeks prior to surgery.  We will need the results of her April mammogram prior to surgery. I talked to her about high-protein diet and decreasing her sugar and carbohydrate intake between now and surgery in order to prepare for wound healing.  I also told her that after her cruise she will need to abstain from the tanning bed prior to surgery and for several months after the surgery.  [place], DO

## 2018-10-04 ENCOUNTER — Ambulatory Visit: Payer: 59 | Admitting: Family Medicine

## 2018-10-04 ENCOUNTER — Encounter: Payer: Self-pay | Admitting: Family Medicine

## 2018-10-04 VITALS — BP 96/54 | HR 83 | Temp 97.9°F | Resp 12 | Ht 65.5 in | Wt 179.5 lb

## 2018-10-04 DIAGNOSIS — F321 Major depressive disorder, single episode, moderate: Secondary | ICD-10-CM | POA: Diagnosis not present

## 2018-10-04 DIAGNOSIS — F411 Generalized anxiety disorder: Secondary | ICD-10-CM | POA: Diagnosis not present

## 2018-10-04 MED ORDER — ESCITALOPRAM OXALATE 5 MG PO TABS: 2.5000 mg | ORAL_TABLET | Freq: Every day | ORAL | 3 refills | Status: DC

## 2018-10-04 NOTE — Progress Notes (Signed)
Subjective:    Patient ID: Rebecca Allen, female    DOB: [birth year], 53 y.o.   MRN: [id number]  HPI  53 year old female presents for follow up GAD, MDD.  Started counseling 2/19... has follow up in  3/11, trying to get in earlier.  Changed from sertraline to cymbalta 30 mg daily.Marland Kitchen stopped  Given SE of shock feeling/jolt in arms , torso, and head  Trazodone  1/2 tab for insomnia... using prn as it makes her really groogy.  Using alprazolam prn.   GAD7 27.. poor control. Pt continues to have issues with inability to relax, anxiety, hopelessenss, insomnia.   Social History /Family History/Past Medical History reviewed in detail and updated in EMR if needed. Blood pressure (!) 96/54, pulse 83, temperature 97.9 F (36.6 C), temperature source Oral, resp. rate 12, height 5' 5.5" (1.664 m), weight 179 lb 8 oz (81.4 kg), SpO2 98 %.  Review of Systems  Constitutional: Negative for fatigue and fever.  HENT: Negative for congestion.   Eyes: Negative for pain.  Respiratory: Negative for cough and shortness of breath.   Cardiovascular: Negative for chest pain, palpitations and leg swelling.  Gastrointestinal: Negative for abdominal pain.  Genitourinary: Negative for dysuria and vaginal bleeding.  Musculoskeletal: Negative for back pain.  Neurological: Negative for syncope, light-headedness and headaches.  Psychiatric/Behavioral: Negative for dysphoric mood.       Objective:   Physical Exam Constitutional:      General: She is not in acute distress.    Appearance: Normal appearance. She is well-developed. She is not ill-appearing or toxic-appearing.  HENT:     Head: Normocephalic.     Right Ear: Hearing, tympanic membrane, ear canal and external ear normal. Tympanic membrane is not erythematous, retracted or bulging.     Left Ear: Hearing, tympanic membrane, ear canal and external ear normal. Tympanic membrane is not erythematous, retracted or bulging.     Nose: No mucosal  edema or rhinorrhea.     Right Sinus: No maxillary sinus tenderness or frontal sinus tenderness.     Left Sinus: No maxillary sinus tenderness or frontal sinus tenderness.     Mouth/Throat:     Pharynx: Uvula midline.  Eyes:     General: Lids are normal. Lids are everted, no foreign bodies appreciated.     Conjunctiva/sclera: Conjunctivae normal.     Pupils: Pupils are equal, round, and reactive to light.  Neck:     Musculoskeletal: Normal range of motion and neck supple.     Thyroid: No thyroid mass or thyromegaly.     Vascular: No carotid bruit.     Trachea: Trachea normal.  Cardiovascular:     Rate and Rhythm: Normal rate and regular rhythm.     Pulses: Normal pulses.     Heart sounds: Normal heart sounds, S1 normal and S2 normal. No murmur. No friction rub. No gallop.   Pulmonary:     Effort: Pulmonary effort is normal. No tachypnea or respiratory distress.     Breath sounds: Normal breath sounds. No decreased breath sounds, wheezing, rhonchi or rales.  Abdominal:     General: Bowel sounds are normal.     Palpations: Abdomen is soft.     Tenderness: There is no abdominal tenderness.  Skin:    General: Skin is warm and dry.     Findings: No rash.  Neurological:     Mental Status: She is alert.  Psychiatric:        Mood and Affect:  Mood is anxious. Mood is not depressed.        Speech: Speech is rapid and pressured.        Behavior: Behavior is agitated. Behavior is cooperative.        Thought Content: Thought content does not include homicidal or suicidal ideation. Thought content does not include homicidal or suicidal plan.        Cognition and Memory: Memory is impaired.        Judgment: Judgment normal.           Assessment & Plan:

## 2018-10-04 NOTE — Patient Instructions (Addendum)
We will call to get you set up with psychiatrist.  Continue seeing current psychologist.

## 2018-10-04 NOTE — Assessment & Plan Note (Signed)
Now intolerance of 3 medication types. Will start trial of low dose lexapro. Continue counseling. Continue trazodone at night prn insomnia. Use alprazolam for panic attacks until mood treated better.  Will refer to psychiatry as multiple medications not tolerated and pt without improvement in mood in last 8 weeks.

## 2018-10-05 ENCOUNTER — Telehealth: Payer: Self-pay | Admitting: Family Medicine

## 2018-10-05 NOTE — Telephone Encounter (Signed)
[facility] faxed mental health eval form to be filled out  In dr Diona Browner in box for review and signature

## 2018-10-06 NOTE — Telephone Encounter (Signed)
Called and [facility] @ 2:08pm @ [phone]) asking the patient to RTC regarding message below.//AB/CMA

## 2018-10-06 NOTE — Telephone Encounter (Signed)
-----   Message from Wallace Going, DO sent at 09/30/2018  8:36 PM EST ----- This patient came in for evaluation for breast reduction.  The computer was down so I did not realize that she was a smoker.  Can you please call her and let her know that she will need to be 6 weeks tobacco free prior to surgery.  She will also need to abstain from the tanning bed for a minimum of 1 month after the surgery.  I wanted her to know ahead of time so she could start smoking cessation.

## 2018-10-07 NOTE — Telephone Encounter (Signed)
Called and spoke with the patient and informed her of the message below.   Patient verbalized understanding and agreed.//AB/CMA

## 2018-10-11 ENCOUNTER — Telehealth: Payer: Self-pay

## 2018-10-11 DIAGNOSIS — Z0279 Encounter for issue of other medical certificate: Secondary | ICD-10-CM

## 2018-10-11 NOTE — Telephone Encounter (Signed)
Pt said [employer] [facility] faxed over paperwork on 10/04/18 and pt wants verification that paperwork has been sent back to [facility]. Pt request cb from [facility].

## 2018-10-11 NOTE — Telephone Encounter (Signed)
Completed and in outbox ready for faxing. Let pt know.

## 2018-10-12 NOTE — Telephone Encounter (Signed)
Paperwork faxed 10/12/2018 @ 9:30am Pt and [facility] are aware

## 2018-10-17 NOTE — Telephone Encounter (Signed)
Copy for pt Copy for scan Copy for billing

## 2018-10-18 ENCOUNTER — Ambulatory Visit: Payer: 59 | Admitting: Family Medicine

## 2018-10-18 ENCOUNTER — Encounter: Payer: Self-pay | Admitting: Family Medicine

## 2018-10-18 VITALS — BP 108/78 | HR 92 | Resp 14 | Ht 65.5 in | Wt 179.8 lb

## 2018-10-18 DIAGNOSIS — F321 Major depressive disorder, single episode, moderate: Secondary | ICD-10-CM

## 2018-10-18 DIAGNOSIS — F411 Generalized anxiety disorder: Secondary | ICD-10-CM

## 2018-10-18 DIAGNOSIS — F5104 Psychophysiologic insomnia: Secondary | ICD-10-CM

## 2018-10-18 MED ORDER — ALPRAZOLAM 0.5 MG PO TABS: 0.2500 mg | ORAL_TABLET | Freq: Every day | ORAL | 0 refills | Status: DC | PRN

## 2018-10-18 NOTE — Assessment & Plan Note (Signed)
Stopped trazodone as behavioral techniques helping with sleep.

## 2018-10-18 NOTE — Assessment & Plan Note (Signed)
She has had improvement in mood with very low dose lexapro.. and she is able to tolerate it! Encouraged her to keep referral to psychiatry and to continue seeing counselor.  Improved GAD7 and PHQ9... pt cleared to return back to work next week as has two MD appts this week.

## 2018-10-18 NOTE — Assessment & Plan Note (Signed)
IMproved per GAD& with lexapro. Continue to use alprazolam  Prn panic attacks.  Offered atarax or clonezepam instead as pt feel ill after taking the alprazolam.  Disucss SE issue with psychiatrist at upcoming [place].

## 2018-10-18 NOTE — Progress Notes (Signed)
Subjective:    Patient ID: Rebecca Allen, female    DOB: 1966/07/31, 53 y.o.   MRN: [id number]  HPI    53 year old female  Presents for follow up MDD and GAD.  At last OV she started more frequent counseling and was changed to  Low dose lexapro.  Continued on trazodone for sleep and alprazolam for panic attacks. Referred to psychiatry.  Today she reports no SE to  very low dose lexapro.   She has felt some more panicky, she feels the depressive symptoms are some better.  She is meditation.. she feels this has helped some. She is falling asleep better at night.. using sound machine to help sleep.  She has stopped trazodone at bedtime.  She wants to continue on same dose of  Lexapro. Alprazolam calms her down but she feels terrible afterward for a few hours...  She is using 1mg   1/4 tab 2-3 times a week.   Depression screen [facility] 2/9 10/18/2018 09/13/2018 09/01/2018  Decreased Interest 2 3 3   Down, Depressed, Hopeless 2 3 3   PHQ - 2 Score 4 6 6   Altered sleeping 1 3 3   Tired, decreased energy 3 3 3   Change in appetite 1 3 3   Feeling bad or failure about yourself  2 3 3   Trouble concentrating 1 2 1   Moving slowly or fidgety/restless 2 2 3   Suicidal thoughts 0 1 0  PHQ-9 Score 14 23 22   Difficult doing work/chores Very difficult Very difficult Very difficult   GAD 7 : Generalized Anxiety Score 10/18/2018 10/04/2018 09/13/2018 09/01/2018  Nervous, Anxious, on Edge 3 3 3 3   Control/stop worrying 2 3 3 3   Worry too much - different things 2 3 3 3   Trouble relaxing 2 3 3 3   Restless 2 3 3 2   Easily annoyed or irritable 2 3 3 3   Afraid - awful might happen 2 3 3 3   Total GAD 7 Score 15 21 21 20   Anxiety Difficulty Very difficult Extremely difficult Very difficult -     Review of Systems  Constitutional: Negative for fatigue and fever.  HENT: Negative for congestion.   Eyes: Negative for pain.  Respiratory: Negative for cough and shortness of breath.   Cardiovascular:  Negative for chest pain, palpitations and leg swelling.  Gastrointestinal: Negative for abdominal pain.  Genitourinary: Negative for dysuria and vaginal bleeding.  Musculoskeletal: Negative for back pain.  Neurological: Negative for syncope, light-headedness and headaches.  Psychiatric/Behavioral: Positive for agitation. Negative for dysphoric mood, sleep disturbance and suicidal ideas. The patient is nervous/anxious.        Objective:   Physical Exam Constitutional:      General: She is not in acute distress.    Appearance: Normal appearance. She is well-developed. She is not ill-appearing or toxic-appearing.  HENT:     Head: Normocephalic.     Right Ear: Hearing, tympanic membrane, ear canal and external ear normal. Tympanic membrane is not erythematous, retracted or bulging.     Left Ear: Hearing, tympanic membrane, ear canal and external ear normal. Tympanic membrane is not erythematous, retracted or bulging.     Nose: No mucosal edema or rhinorrhea.     Right Sinus: No maxillary sinus tenderness or frontal sinus tenderness.     Left Sinus: No maxillary sinus tenderness or frontal sinus tenderness.     Mouth/Throat:     Pharynx: Uvula midline.  Eyes:     General: Lids are normal. Lids are everted,  no foreign bodies appreciated.     Conjunctiva/sclera: Conjunctivae normal.     Pupils: Pupils are equal, round, and reactive to light.  Neck:     Musculoskeletal: Normal range of motion and neck supple.     Thyroid: No thyroid mass or thyromegaly.     Vascular: No carotid bruit.     Trachea: Trachea normal.  Cardiovascular:     Rate and Rhythm: Normal rate and regular rhythm.     Pulses: Normal pulses.     Heart sounds: Normal heart sounds, S1 normal and S2 normal. No murmur. No friction rub. No gallop.   Pulmonary:     Effort: Pulmonary effort is normal. No tachypnea or respiratory distress.     Breath sounds: Normal breath sounds. No decreased breath sounds, wheezing, rhonchi or  rales.  Abdominal:     General: Bowel sounds are normal.     Palpations: Abdomen is soft.     Tenderness: There is no abdominal tenderness.  Skin:    General: Skin is warm and dry.     Findings: No rash.  Neurological:     Mental Status: She is alert.  Psychiatric:        Mood and Affect: Mood is anxious. Mood is not depressed.        Speech: Speech normal. Speech is not rapid and pressured.        Behavior: Behavior normal. Behavior is not agitated. Behavior is cooperative.        Thought Content: Thought content normal. Thought content does not include homicidal or suicidal ideation. Thought content does not include homicidal or suicidal plan.        Cognition and Memory: Cognition and memory normal.        Judgment: Judgment normal.           Assessment & Plan:

## 2018-10-18 NOTE — Patient Instructions (Addendum)
Stop to see Rebecca Allen on way out today to answer questions about release of info for psychiatrist. Continue on current dose of  Lexapro.  Cleared to return to work next week starting  on 10/24/2018

## 2018-10-19 ENCOUNTER — Ambulatory Visit: Payer: 59 | Admitting: Psychology

## 2018-10-26 ENCOUNTER — Other Ambulatory Visit: Payer: Self-pay | Admitting: Family Medicine

## 2018-10-26 NOTE — Telephone Encounter (Signed)
Last office visit 10/18/2018 for Depression/Anxiety.  Last refilled 09/27/2018 for #30 with no refills. No future appointments with PCP.

## 2018-11-11 ENCOUNTER — Encounter: Payer: Self-pay | Admitting: Family Medicine

## 2018-11-11 ENCOUNTER — Other Ambulatory Visit: Payer: Self-pay

## 2018-11-11 ENCOUNTER — Ambulatory Visit (INDEPENDENT_AMBULATORY_CARE_PROVIDER_SITE_OTHER): Payer: 59 | Admitting: Family Medicine

## 2018-11-11 DIAGNOSIS — J309 Allergic rhinitis, unspecified: Secondary | ICD-10-CM | POA: Diagnosis not present

## 2018-11-11 NOTE — Progress Notes (Signed)
AVS and work note mailed to patient as instructed by Dr. Diona Browner.

## 2018-11-11 NOTE — Assessment & Plan Note (Signed)
Most likely cause of symptoms is allergies... treat with cahnge to Xyzal. Decrease BID floase to once daily as may be irrittating nasal passages.

## 2018-11-11 NOTE — Patient Instructions (Addendum)
Stop claritin.  Continue flonase 2 sprays per nostril daily.  Start  Xyzal at bedtime.  Given low likelyhood but still possibility of  COVID 19 as cause of symptoms... remain out of work  At least 7 days from onset of symptoms, 3 days fever free and with no respiratory symptoms. Earliest possible work return date is 11/16/2018.

## 2018-11-11 NOTE — Progress Notes (Signed)
VIRTUAL VISIT Due to national recommendations of social distancing due to [place] 19, a virtual visit is felt to be most appropriate for this patient at this time.   I connected with Kathlyn Hovland on 11/11/18 at  9:45 AM EDT by [facility] and verified that I am speaking with the correct person using two identifiers.   I discussed the limitations, risks, security and privacy concerns of performing an evaluation and management service by [facility] and the availability of in person appointments. I also discussed with the patient that there may be a patient responsible charge related to this service. The patient expressed understanding and agreed to proceed.  Patient location: Home Provider Location: [place] [facility] Participants: Eliezer Lofts and [place]   Chief Complaint  Patient presents with  . Ear Pain  . Nasal Congestion  . Headache  . Shortness of Breath    History of Present Illness: Shortness of Breath  Associated symptoms include ear pain, headaches and rhinorrhea. Pertinent negatives include no sore throat.  Otalgia   There is pain in both ears. The current episode started in the past 7 days (3). The problem has been gradually worsening. There has been no fever. Associated symptoms include coughing, headaches and rhinorrhea. Pertinent negatives include no ear discharge or sore throat. Associated symptoms comments: Sneezing, congestion.  Facial sinus pressure  bilaterally   Dry and productive cough.. clear mucus   mild constant SOB, mild.. has to take in a deep breath.. Treatments tried: Claritin and flonase. The treatment provided mild relief. There is no history of a chronic ear infection, hearing loss or a tympanostomy tube.    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  The importance of social distancing was discussed today.    Review of Systems  HENT: Positive for ear pain and rhinorrhea. Negative for ear discharge and sore throat.   Respiratory: Positive for cough and shortness of breath.   Neurological: Positive for headaches.      Past Medical History:  Diagnosis Date  . Depression   . GERD (gastroesophageal reflux disease)   . Hyperlipidemia   . Treadmill stress test negative for angina pectoris 12/18/2010    reports that she has been smoking cigarettes. She has a 7.50 pack-year smoking history. She has never used smokeless tobacco. She reports current alcohol use of about 2.0 standard drinks of alcohol per week. She reports that she does not use drugs.   Current Outpatient Medications:  .  ALPRAZolam (XANAX) 0.5 MG tablet, Take 0.5-1 tablets (0.25-0.5 mg total) by mouth daily as needed for anxiety., Disp: 30 tablet, Rfl: 0 .  calcium carbonate (TUMS - DOSED IN MG ELEMENTAL CALCIUM) 500 MG chewable tablet, Chew 6 tablets by mouth daily as needed for indigestion or heartburn., Disp: , Rfl:  .  escitalopram (LEXAPRO) 5 MG tablet, Take 0.5 tablets (2.5 mg total) by mouth daily., Disp: 30 tablet, Rfl: 3 .  estradiol (VIVELLE-DOT) 0.05 MG/24HR patch, Place 1 patch trans-dermally twice a week. Remove old patch before applying new patch., Disp: 8 patch, Rfl: 11 .  meloxicam (MOBIC) 15 MG tablet, TAKE 1 TABLET BY MOUTH EVERY DAY, Disp: 30 tablet, Rfl: 1 .  pantoprazole (PROTONIX) 40 MG tablet, Take 40 mg by mouth daily., Disp: , Rfl:  .  traZODone (DESYREL) 50 MG tablet, Take 0.5-1 tablets (25-50 mg total) by mouth at  bedtime as needed for sleep., Disp: 30 tablet, Rfl: 3   Observations/Objective: Temperature 97.6 F (36.4 C), temperature source Oral, height 5' 5.5" (1.664 m).  Physical Exam  Physical Exam Constitutional:      General: She is not in acute distress. Pulmonary:     Effort: Pulmonary effort is normal. No respiratory distress.  Neurological:     Mental Status: She is alert and oriented to  person, place, and time.  Psychiatric:        Mood and Affect: Mood normal.        Behavior: Behavior normal.   Assessment and Plan  Allergic sinusitis Most likely cause of symptoms is allergies... treat with cahnge to Xyzal. Decrease BID floase to once daily as may be irrittating nasal passages.     Given low likelyhood but still possibility of  COVID 19 as cause of symptoms... remain out of work  At least 7 days from onset of symptoms, 3 days fever free and with no respiratory symptoms. Earliest possible work return date is 11/16/2018.   I discussed the assessment and treatment plan with the patient. The patient was provided an opportunity to ask questions and all were answered. The patient agreed with the plan and demonstrated an understanding of the instructions.   The patient was advised to call back or seek an in-person evaluation if the symptoms worsen or if the condition fails to improve as anticipated.     Eliezer Lofts, MD

## 2018-11-15 ENCOUNTER — Encounter: Payer: Self-pay | Admitting: Family Medicine

## 2018-11-15 ENCOUNTER — Other Ambulatory Visit: Payer: Self-pay

## 2018-11-15 ENCOUNTER — Ambulatory Visit (INDEPENDENT_AMBULATORY_CARE_PROVIDER_SITE_OTHER): Payer: 59 | Admitting: Family Medicine

## 2018-11-15 ENCOUNTER — Telehealth: Payer: Self-pay

## 2018-11-15 ENCOUNTER — Encounter: Payer: Self-pay | Admitting: *Deleted

## 2018-11-15 VITALS — Temp 98.2°F | Ht 65.5 in

## 2018-11-15 DIAGNOSIS — R05 Cough: Secondary | ICD-10-CM | POA: Diagnosis not present

## 2018-11-15 DIAGNOSIS — R053 Chronic cough: Secondary | ICD-10-CM | POA: Insufficient documentation

## 2018-11-15 DIAGNOSIS — J309 Allergic rhinitis, unspecified: Secondary | ICD-10-CM

## 2018-11-15 MED ORDER — AZITHROMYCIN 250 MG PO TABS: ORAL_TABLET | ORAL | 0 refills | Status: DC

## 2018-11-15 MED ORDER — OLOPATADINE HCL 0.1 % OP SOLN: 1.0000 [drp] | Freq: Two times a day (BID) | OPHTHALMIC | 12 refills | Status: DC

## 2018-11-15 NOTE — Progress Notes (Signed)
VIRTUAL VISIT Due to national recommendations of social distancing due to [place] 19, a virtual visit is felt to be most appropriate for this patient at this time.   I connected with Rebecca Allen on 11/15/18 at 10:00 AM EDT by [facility] and verified that I am speaking with the correct person using two identifiers.   I discussed the limitations, risks, security and privacy concerns of performing an evaluation and management service by [facility] and the availability of in person appointments. I also discussed with the patient that there may be a patient responsible charge related to this service. The patient expressed understanding and agreed to proceed.  Patient location: Home Provider Location: [place] [facility] Participants: Eliezer Lofts and [place]   Chief Complaint  Patient presents with  . Follow-up    11/11/2018 not any better  . Fever    History of Present Illness:  53 year old female with history of seasonal allergies presents with continued respiratory symptoms. She was treated on 4/3 for allergic sinusitis with Xyzal.   She reports that since last OV she has developed worsened cough.  She has chest and back pain from cough. Cough is dry.  She had a mild increased temperature yesterday 99.72F   Took some tylenol.  Nasal congestion is some better with Xyzal..  Cheek bones sore.  No ear pain any longer.Marland Kitchen less ear congestion.   Some mild SOB and wheezing off and on.   No sneeze  Some matting of eyes when waking up... eyes irritated when going outside... visine eye drops help some after initial burn.    COVID 19 screen No recent travel or known exposure to [place]  The importance of social distancing was discussed today.   Review of Systems  Constitutional: Positive for fever and malaise/fatigue. Negative for chills.  HENT: Positive for congestion and sinus pain. Negative for ear pain and sore throat.   Eyes: Negative for pain.  Respiratory: Positive for  shortness of breath and wheezing.   Cardiovascular: Negative for chest pain and leg swelling.  Gastrointestinal: Negative for abdominal pain, nausea and vomiting.  Musculoskeletal: Negative for myalgias.      Past Medical History:  Diagnosis Date  . Depression   . GERD (gastroesophageal reflux disease)   . Hyperlipidemia   . Treadmill stress test negative for angina pectoris 12/18/2010    reports that she has been smoking cigarettes. She has a 7.50 pack-year smoking history. She has never used smokeless tobacco. She reports current alcohol use of about 2.0 standard drinks of alcohol per week. She reports that she does not use drugs.   Current Outpatient Medications:  .  ALPRAZolam (XANAX) 0.5 MG tablet, Take 0.5-1 tablets (0.25-0.5 mg total) by mouth daily as needed for anxiety., Disp: 30 tablet, Rfl: 0 .  calcium carbonate (TUMS - DOSED IN MG ELEMENTAL CALCIUM) 500 MG chewable tablet, Chew 6 tablets by mouth daily as needed for indigestion or heartburn., Disp: , Rfl:  .  escitalopram (LEXAPRO) 5 MG tablet, Take 0.5 tablets (2.5 mg total) by mouth daily., Disp: 30 tablet, Rfl: 3 .  estradiol (VIVELLE-DOT) 0.05 MG/24HR patch, Place 1 patch trans-dermally twice a week. Remove old patch before applying new patch., Disp: 8 patch, Rfl: 11 .  meloxicam (MOBIC) 15 MG tablet, TAKE 1 TABLET BY MOUTH EVERY DAY, Disp: 30 tablet, Rfl: 1 .  pantoprazole (PROTONIX) 40 MG tablet, Take 40 mg by mouth daily., Disp: , Rfl:  .  traZODone (DESYREL) 50 MG tablet, Take  0.5-1 tablets (25-50 mg total) by mouth at bedtime as needed for sleep., Disp: 30 tablet, Rfl: 3   Observations/Objective: Temperature 98.2 F (36.8 C), temperature source Oral, height 5' 5.5" (1.664 m).  Physical Exam   Physical Exam Constitutional:      General: She is not in acute distress. Pulmonary:     Effort: Pulmonary effort is normal. No respiratory distress.  Neurological:     Mental Status: She is alert and oriented to person,  place, and time.  Psychiatric:        Mood and Affect: Mood normal.        Behavior: Behavior normal.   Assessment and Plan  Persistent cough Less likely COVID give no known exposure but will take COVID return to work precautions. Most likely bacterial super infection.. treat with course of azithromycin.  Cough suppressant as needed.  Allergic sinusitis Treat with topical eye drops for eye symptoms.  Continue Xyzal.  Follow up closely in 5 days. Pt placed on respiratory call list.   I discussed the assessment and treatment plan with the patient. The patient was provided an opportunity to ask questions and all were answered. The patient agreed with the plan and demonstrated an understanding of the instructions.   The patient was advised to call back or seek an in-person evaluation if the symptoms worsen or if the condition fails to improve as anticipated.     Eliezer Lofts, MD

## 2018-11-15 NOTE — Assessment & Plan Note (Signed)
Less likely COVID give no known exposure but will take COVID return to work precautions. Most likely bacterial super infection.. treat with course of azithromycin.  Cough suppressant as needed.

## 2018-11-15 NOTE — Progress Notes (Signed)
AVS and work note mailed to patient as instructed by Dr. Diona Browner.  Tish added to respiratory call log to check on 11/21/2018.

## 2018-11-15 NOTE — Telephone Encounter (Signed)
Inbound call to triage - patient reports continued respiratory concerns including incessant cough. Patient is requesting additional medication.   Virtual video appt scheduled 11/15/18 @ 1000 with PCP.

## 2018-11-15 NOTE — Patient Instructions (Addendum)
Continue Xyzal at bedtime for allergies.  Start Zpack for possible bacterial superinfection. Can try topical  eye drops for allergies.  If you are note feeling better after antibiotics.Marland Kitchen we can consider add singulair for allergies and albuterol for reactive airway. If severe shortness of breath go to ER ASAP

## 2018-11-15 NOTE — Assessment & Plan Note (Signed)
Treat with topical eye drops for eye symptoms.  Continue Xyzal.

## 2018-11-21 ENCOUNTER — Telehealth: Payer: Self-pay | Admitting: Family Medicine

## 2018-11-21 NOTE — Telephone Encounter (Signed)
Patient said she was seen by Dr. Diona Browner on 11/15/18.  Patient said Butch Penny was going to send her a work note to return to work on 11/23/18.  Patient didn't receive the note. Patient said it can be mailed or patient said she could come by and pick it up.

## 2018-11-21 NOTE — Telephone Encounter (Signed)
Rebecca Allen notified work letter was mailed on 11/15/2018.   She will wait and sees if it comes in the mail today.  If not she will call me back and will just come by office to pick up a copy.  I also had Rebecca Allen on my respiratory illness follow up log to check on today.  Rebecca Allen states she is doing better.  The z-pak worked.  She plans on returning back to work on Wednesday 11/23/2018.

## 2018-11-22 NOTE — Telephone Encounter (Signed)
Tish notified by telephone that letter is ready to be picked up at the front desk.

## 2018-11-22 NOTE — Telephone Encounter (Signed)
Patient called and said she didn't receive the work note.  Patient is requesting to come by and pick up to work note.  Please call patient when letter is ready at [phone].

## 2018-11-25 ENCOUNTER — Encounter: Payer: Self-pay | Admitting: Family Medicine

## 2018-11-25 ENCOUNTER — Ambulatory Visit (INDEPENDENT_AMBULATORY_CARE_PROVIDER_SITE_OTHER): Payer: 59 | Admitting: Family Medicine

## 2018-11-25 VITALS — Temp 98.0°F | Ht 65.5 in

## 2018-11-25 DIAGNOSIS — M940 Chondrocostal junction syndrome [Tietze]: Secondary | ICD-10-CM | POA: Diagnosis not present

## 2018-11-25 DIAGNOSIS — S29012A Strain of muscle and tendon of back wall of thorax, initial encounter: Secondary | ICD-10-CM | POA: Diagnosis not present

## 2018-11-25 DIAGNOSIS — F410 Panic disorder [episodic paroxysmal anxiety] without agoraphobia: Secondary | ICD-10-CM | POA: Diagnosis not present

## 2018-11-25 DIAGNOSIS — F321 Major depressive disorder, single episode, moderate: Secondary | ICD-10-CM | POA: Diagnosis not present

## 2018-11-25 NOTE — Assessment & Plan Note (Signed)
Typical for her.. likely exacerbated by fall and trying to catch herself.  NSAIDs with help.

## 2018-11-25 NOTE — Assessment & Plan Note (Signed)
Improved control. Not reviewed in detail.

## 2018-11-25 NOTE — Assessment & Plan Note (Signed)
Treat with meloxicam, muscle relaxant, heat and gentle stretches. Massage as available. Follow up if not improving in 2 weeks.

## 2018-11-25 NOTE — Assessment & Plan Note (Signed)
Symptoms do not feel like panic attack.

## 2018-11-25 NOTE — Patient Instructions (Signed)
Start upper back and chest wall stretches, heat and massage.  Start meloxicam once daily with  Food.  Can use muscle relaxant at night.  Got to ER if severe chest pain or shortness of breath.   Back Exercises If you have pain in your back, do these exercises 2-3 times each day or as told by your doctor. When the pain goes away, do the exercises once each day, but repeat the steps more times for each exercise (do more repetitions). If you do not have pain in your back, do these exercises once each day or as told by your doctor. Exercises Single Knee to Chest Do these steps 3-5 times in a row for each leg: 1. Lie on your back on a firm bed or the floor with your legs stretched out. 2. Bring one knee to your chest. 3. Hold your knee to your chest by grabbing your knee or thigh. 4. Pull on your knee until you feel a gentle stretch in your lower back. 5. Keep doing the stretch for 10-30 seconds. 6. Slowly let go of your leg and straighten it. Pelvic Tilt Do these steps 5-10 times in a row: 1. Lie on your back on a firm bed or the floor with your legs stretched out. 2. Bend your knees so they point up to the ceiling. Your feet should be flat on the floor. 3. Tighten your lower belly (abdomen) muscles to press your lower back against the floor. This will make your tailbone point up to the ceiling instead of pointing down to your feet or the floor. 4. Stay in this position for 5-10 seconds while you gently tighten your muscles and breathe evenly. Cat-Cow Do these steps until your lower back bends more easily: 1. Get on your hands and knees on a firm surface. Keep your hands under your shoulders, and keep your knees under your hips. You may put padding under your knees. 2. Let your head hang down, and make your tailbone point down to the floor so your lower back is round like the back of a cat. 3. Stay in this position for 5 seconds. 4. Slowly lift your head and make your tailbone point up to the  ceiling so your back hangs low (sags) like the back of a cow. 5. Stay in this position for 5 seconds.  Press-Ups Do these steps 5-10 times in a row: 1. Lie on your belly (face-down) on the floor. 2. Place your hands near your head, about shoulder-width apart. 3. While you keep your back relaxed and keep your hips on the floor, slowly straighten your arms to raise the top half of your body and lift your shoulders. Do not use your back muscles. To make yourself more comfortable, you may change where you place your hands. 4. Stay in this position for 5 seconds. 5. Slowly return to lying flat on the floor.  Bridges Do these steps 10 times in a row: 1. Lie on your back on a firm surface. 2. Bend your knees so they point up to the ceiling. Your feet should be flat on the floor. 3. Tighten your butt muscles and lift your butt off of the floor until your waist is almost as high as your knees. If you do not feel the muscles working in your butt and the back of your thighs, slide your feet 1-2 inches farther away from your butt. 4. Stay in this position for 3-5 seconds. 5. Slowly lower your butt to the floor,  and let your butt muscles relax. If this exercise is too easy, try doing it with your arms crossed over your chest. Belly Crunches Do these steps 5-10 times in a row: 1. Lie on your back on a firm bed or the floor with your legs stretched out. 2. Bend your knees so they point up to the ceiling. Your feet should be flat on the floor. 3. Cross your arms over your chest. 4. Tip your chin a little bit toward your chest but do not bend your neck. 5. Tighten your belly muscles and slowly raise your chest just enough to lift your shoulder blades a tiny bit off of the floor. 6. Slowly lower your chest and your head to the floor. Back Lifts Do these steps 5-10 times in a row: 1. Lie on your belly (face-down) with your arms at your sides, and rest your forehead on the floor. 2. Tighten the muscles in  your legs and your butt. 3. Slowly lift your chest off of the floor while you keep your hips on the floor. Keep the back of your head in line with the curve in your back. Look at the floor while you do this. 4. Stay in this position for 3-5 seconds. 5. Slowly lower your chest and your face to the floor. Contact a doctor if:  Your back pain gets a lot worse when you do an exercise.  Your back pain does not lessen 2 hours after you exercise. If you have any of these problems, stop doing the exercises. Do not do them again unless your doctor says it is okay. Get help right away if:  You have sudden, very bad back pain. If this happens, stop doing the exercises. Do not do them again unless your doctor says it is okay. This information is not intended to replace advice given to you by your health care provider. Make sure you discuss any questions you have with your health care provider. Document Released: 08/29/2010 Document Revised: 04/20/2018 Document Reviewed: 09/20/2014 Elsevier Interactive Patient Education  [employer].

## 2018-11-25 NOTE — Progress Notes (Signed)
VIRTUAL VISIT Due to national recommendations of social distancing due to [place] 19, a virtual visit is felt to be most appropriate for this patient at this time.   I connected with the patient on 11/25/18 at  4:00 PM EDT by virtual telehealth platform and verified that I am speaking with the correct person using two identifiers.   I discussed the limitations, risks, security and privacy concerns of performing an evaluation and management service by  virtual telehealth platform and the availability of in person appointments. I also discussed with the patient that there may be a patient responsible charge related to this service. The patient expressed understanding and agreed to proceed.  Patient location: Home Provider Location: [facility] Participants: Eliezer Lofts and [place]   Chief Complaint  Patient presents with  . Back Pain    on left side under shoulder blade  . Chest wall pain  . Shortness of Breath    History of Present Illness:  53 year old female with history of anxiety presents for left sided upper back pain under shoulder blade in addition to chest wall pain and SOB.   She reports new  Onset of pain in last 2 days. In left Pain with deep breaths or moving.  No SOB. No Fever. No cough.  Also experience recurrence of costochondritis ... currently having chest wall ttp in anterior chest bilaterally.   no numbness, no weakness.  No radiation of pain.  no low back pain.   no incontinence. No t keeping her up at ngiht.   She has been using moist heat. Tylenol. Cannot take ibuprofen   no known  Fall, no known injury. 1 week prior did fall but not pain immediately after.  COVID 19 screen No recent travel or known exposure to COVID19 The patient denies respiratory symptoms of COVID 19 at this time.  The importance of social distancing was discussed today.   Review of Systems  Constitutional: Negative for chills and fever.  HENT: Negative for  congestion and ear pain.   Eyes: Negative for pain and redness.  Respiratory: Negative for cough and shortness of breath.   Cardiovascular: Positive for chest pain. Negative for palpitations and leg swelling.  Gastrointestinal: Negative for abdominal pain, blood in stool, constipation, diarrhea, nausea and vomiting.  Genitourinary: Negative for dysuria.  Musculoskeletal: Positive for back pain. Negative for falls and myalgias.  Skin: Negative for rash.  Neurological: Negative for dizziness.  Psychiatric/Behavioral: Negative for depression. The patient is not nervous/anxious.       Past Medical History:  Diagnosis Date  . Depression   . GERD (gastroesophageal reflux disease)   . Hyperlipidemia   . Treadmill stress test negative for angina pectoris 12/18/2010    reports that she has been smoking cigarettes. She has a 7.50 pack-year smoking history. She has never used smokeless tobacco. She reports current alcohol use of about 2.0 standard drinks of alcohol per week. She reports that she does not use drugs.   Current Outpatient Medications:  .  ALPRAZolam (XANAX) 0.5 MG tablet, Take 0.5-1 tablets (0.25-0.5 mg total) by mouth daily as needed for anxiety., Disp: 30 tablet, Rfl: 0 .  calcium carbonate (TUMS - DOSED IN MG ELEMENTAL CALCIUM) 500 MG chewable tablet, Chew 6 tablets by mouth daily as needed for indigestion or heartburn., Disp: , Rfl:  .  escitalopram (LEXAPRO) 5 MG tablet, Take 0.5 tablets (2.5 mg total) by mouth daily., Disp: 30 tablet, Rfl: 3 .  estradiol (VIVELLE-DOT)  0.05 MG/24HR patch, Place 1 patch trans-dermally twice a week. Remove old patch before applying new patch., Disp: 8 patch, Rfl: 11 .  meloxicam (MOBIC) 15 MG tablet, TAKE 1 TABLET BY MOUTH EVERY DAY, Disp: 30 tablet, Rfl: 1 .  olopatadine (PATANOL) 0.1 % ophthalmic solution, Place 1 drop into both eyes 2 (two) times daily., Disp: 5 mL, Rfl: 12 .  pantoprazole (PROTONIX) 40 MG tablet, Take 40 mg by mouth daily., Disp:  , Rfl:  .  traZODone (DESYREL) 50 MG tablet, Take 0.5-1 tablets (25-50 mg total) by mouth at bedtime as needed for sleep., Disp: 30 tablet, Rfl: 3   Observations/Objective: Temperature 98 F (36.7 C), temperature source Oral, height 5' 5.5" (1.664 m).  Physical Exam   Assessment and Plan   Upper back strain Treat with meloxicam, muscle relaxant, heat and gentle stretches. Massage as available. Follow up if not improving in 2 weeks.  COSTOCHONDRITIS, RECURRENT Typical for her.. likely exacerbated by fall and trying to catch herself.  NSAIDs with help.   Current moderate episode of major depressive disorder without prior episode (HCC) Improved control. Not reviewed in detail.  Panic attacks Symptoms do not feel like panic attack.   I discussed the assessment and treatment plan with the patient. The patient was provided an opportunity to ask questions and all were answered. The patient agreed with the plan and demonstrated an understanding of the instructions.   The patient was advised to call back or seek an in-person evaluation if the symptoms worsen or if the condition fails to improve as anticipated.     Eliezer Lofts, MD

## 2018-12-05 ENCOUNTER — Other Ambulatory Visit: Payer: Self-pay

## 2018-12-05 ENCOUNTER — Ambulatory Visit
Admission: RE | Admit: 2018-12-05 | Discharge: 2018-12-05 | Disposition: A | Discharge status: Home / Self Care | Payer: 59 | Source: Ambulatory Visit | Attending: Obstetrics and Gynecology | Admitting: Obstetrics and Gynecology

## 2018-12-05 DIAGNOSIS — N6320 Unspecified lump in the left breast, unspecified quadrant: Secondary | ICD-10-CM | POA: Insufficient documentation

## 2018-12-05 DIAGNOSIS — R928 Other abnormal and inconclusive findings on diagnostic imaging of breast: Secondary | ICD-10-CM | POA: Diagnosis not present

## 2018-12-05 DIAGNOSIS — N6323 Unspecified lump in the left breast, lower outer quadrant: Secondary | ICD-10-CM | POA: Diagnosis not present

## 2018-12-05 DIAGNOSIS — N6342 Unspecified lump in left breast, subareolar: Secondary | ICD-10-CM | POA: Diagnosis not present

## 2018-12-05 DIAGNOSIS — N6042 Mammary duct ectasia of left breast: Secondary | ICD-10-CM | POA: Diagnosis not present

## 2019-03-02 ENCOUNTER — Ambulatory Visit: Payer: 59 | Admitting: Family Medicine

## 2019-03-02 ENCOUNTER — Other Ambulatory Visit: Payer: Self-pay

## 2019-03-02 ENCOUNTER — Ambulatory Visit (INDEPENDENT_AMBULATORY_CARE_PROVIDER_SITE_OTHER)
Admission: RE | Admit: 2019-03-02 | Discharge: 2019-03-02 | Disposition: A | Discharge status: Home / Self Care | Payer: 59 | Source: Ambulatory Visit | Attending: Family Medicine | Admitting: Family Medicine

## 2019-03-02 ENCOUNTER — Encounter: Payer: Self-pay | Admitting: Family Medicine

## 2019-03-02 VITALS — BP 118/72 | HR 89 | Temp 98.3°F | Ht 65.5 in | Wt 178.8 lb

## 2019-03-02 DIAGNOSIS — W19XXXA Unspecified fall, initial encounter: Secondary | ICD-10-CM

## 2019-03-02 DIAGNOSIS — M25522 Pain in left elbow: Secondary | ICD-10-CM | POA: Diagnosis not present

## 2019-03-02 MED ORDER — TRAMADOL HCL 50 MG PO TABS: 50.0000 mg | ORAL_TABLET | Freq: Three times a day (TID) | ORAL | 0 refills | Status: AC | PRN

## 2019-03-02 NOTE — Assessment & Plan Note (Signed)
No fracture onx-ray.  Treat with tylenol and tramadol for breakthrough as GERD with ibuprofen.   Ice, sling start PT.  Call if not improving in 1-2 weeks for ortho referral.

## 2019-03-02 NOTE — Patient Instructions (Signed)
Ice, elevation.  Stop ibuprofen.  Start acetaminophen regularly. C an use tramadol for break through pain.  Start gentle stretching as able.  Wear sling.  If not improving in next 1-2 weeks call for [facility] referral.

## 2019-03-02 NOTE — Addendum Note (Signed)
Addended by: Eliezer Lofts E on: 03/02/2019 03:17 PM   Modules accepted: Orders

## 2019-03-02 NOTE — Progress Notes (Addendum)
Chief Complaint  Patient presents with  . Elbow Pain    C/o left elbow pain due to fall down deck steps at home on 02/26/19.  Pain radiates up into left shoulder.  Also, c/o occasional numbness in left hand. Tried ice, heat and ibuprofen.     History of Present Illness: HPI  53 year old female pt presents with pain in left elbow after fall on 02/26/2019. Lost balnce and tripped.. landed directly on tip of elbow.  She has been treating with ibuprofen, ice and heat. Pain radiating down arm. Pain greatest with straightening.  Swelling , no redness.  She has been bracing it with an wrap. Fingers tingling in left hand.   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   No injury to left arm.  No neck pain, mildly stiff given holding arm up.  COVID 19 screen No recent travel or known exposure to COVID19 The patient denies respiratory symptoms of COVID 19 at this time.  The importance of social distancing was discussed today.   Review of Systems  Constitutional: Negative for chills and fever.  HENT: Negative for congestion and ear pain.   Eyes: Negative for pain and redness.  Respiratory: Negative for cough and shortness of breath.   Cardiovascular: Negative for chest pain, palpitations and leg swelling.  Gastrointestinal: Negative for abdominal pain, blood in stool, constipation, diarrhea, nausea and vomiting.  Genitourinary: Negative for dysuria.  Musculoskeletal: Negative for falls and myalgias.  Skin: Negative for rash.  Neurological: Negative for dizziness.  Psychiatric/Behavioral: Negative for depression. The patient is not nervous/anxious.       Past Medical History:  Diagnosis Date  . Depression   . GERD (gastroesophageal reflux disease)   . Hyperlipidemia   . Treadmill stress test negative for angina pectoris 12/18/2010    reports that she has been smoking cigarettes. She has a 7.50 pack-year smoking history. She has never used smokeless tobacco. She reports  current alcohol use of about 2.0 standard drinks of alcohol per week. She reports that she does not use drugs.   Current Outpatient Medications:  .  ALPRAZolam (XANAX) 0.5 MG tablet, Take 0.5-1 tablets (0.25-0.5 mg total) by mouth daily as needed for anxiety., Disp: 30 tablet, Rfl: 0 .  calcium carbonate (TUMS - DOSED IN MG ELEMENTAL CALCIUM) 500 MG chewable tablet, Chew 6 tablets by mouth daily as needed for indigestion or heartburn., Disp: , Rfl:  .  escitalopram (LEXAPRO) 5 MG tablet, Take 0.5 tablets (2.5 mg total) by mouth daily., Disp: 30 tablet, Rfl: 3 .  estradiol (VIVELLE-DOT) 0.05 MG/24HR patch, Place 1 patch trans-dermally twice a week. Remove old patch before applying new patch., Disp: 8 patch, Rfl: 11 .  pantoprazole (PROTONIX) 40 MG tablet, Take 40 mg by mouth daily., Disp: , Rfl:    Observations/Objective: Blood pressure 118/72, pulse 89, temperature 98.3 F (36.8 C), temperature source Temporal, height 5' 5.5" (1.664 m), weight 178 lb 12 oz (81.1 kg), SpO2 97 %.  Physical Exam Constitutional:      General: She is not in acute distress.    Appearance: Normal appearance. She is well-developed. She is not ill-appearing or toxic-appearing.  HENT:     Head: Normocephalic.     Right Ear: Hearing, tympanic membrane, ear canal and external ear normal. Tympanic membrane is not erythematous, retracted or bulging.     Left Ear: Hearing, tympanic membrane, ear canal and external ear normal. Tympanic membrane is not erythematous, retracted or bulging.  Nose: No mucosal edema or rhinorrhea.     Right Sinus: No maxillary sinus tenderness or frontal sinus tenderness.     Left Sinus: No maxillary sinus tenderness or frontal sinus tenderness.     Mouth/Throat:     Pharynx: Uvula midline.  Eyes:     General: Lids are normal. Lids are everted, no foreign bodies appreciated.     Conjunctiva/sclera: Conjunctivae normal.     Pupils: Pupils are equal, round, and reactive to light.  Neck:      Musculoskeletal: Normal range of motion and neck supple.     Thyroid: No thyroid mass or thyromegaly.     Vascular: No carotid bruit.     Trachea: Trachea normal.  Cardiovascular:     Rate and Rhythm: Normal rate and regular rhythm.     Pulses: Normal pulses.     Heart sounds: Normal heart sounds, S1 normal and S2 normal. No murmur. No friction rub. No gallop.   Pulmonary:     Effort: Pulmonary effort is normal. No tachypnea or respiratory distress.     Breath sounds: Normal breath sounds. No decreased breath sounds, wheezing, rhonchi or rales.  Abdominal:     General: Bowel sounds are normal.     Palpations: Abdomen is soft.     Tenderness: There is no abdominal tenderness.  Musculoskeletal:     Left elbow: She exhibits decreased range of motion and swelling. She exhibits no effusion and no deformity. Tenderness found. Radial head, lateral epicondyle and olecranon process tenderness noted. No medial epicondyle tenderness noted.  Skin:    General: Skin is warm and dry.     Findings: No rash.  Neurological:     Mental Status: She is alert.  Psychiatric:        Mood and Affect: Mood is not anxious or depressed.        Speech: Speech normal.        Behavior: Behavior normal. Behavior is cooperative.        Thought Content: Thought content normal.        Judgment: Judgment normal.      Assessment and Plan   Left elbow pain No fracture onx-ray.  Treat with tylenol and tramadol for breakthrough as GERD with ibuprofen.   Ice, sling start PT.  Call if not improving in 1-2 weeks for ortho referral.     Eliezer Lofts, MD

## 2019-03-06 ENCOUNTER — Telehealth: Payer: Self-pay | Admitting: Family Medicine

## 2019-03-06 DIAGNOSIS — W19XXXA Unspecified fall, initial encounter: Secondary | ICD-10-CM

## 2019-03-06 DIAGNOSIS — M25522 Pain in left elbow: Secondary | ICD-10-CM

## 2019-03-06 NOTE — Telephone Encounter (Signed)
Patient called and stated that her elbow has not improved and needs a work note witting her out for the rest of the week. She also stated that she would like a referral sent to the Orthopedic doctor.   C/B # [phone]

## 2019-03-06 NOTE — Telephone Encounter (Signed)
Okay to write note for work until 8/3.  referral sent.

## 2019-03-06 NOTE — Telephone Encounter (Signed)
Work note written as instructed by Dr. Diona Browner.  Left message for Tish that letter is ready to be picked up at the front desk.  I also advised that Dr. Diona Browner put in the referral for Ortho and Rosaria Ferries or Charmaine will be calling her with that appointment.

## 2019-03-27 ENCOUNTER — Ambulatory Visit: Payer: 59 | Admitting: Occupational Therapy

## 2019-04-03 ENCOUNTER — Other Ambulatory Visit: Payer: Self-pay

## 2019-04-03 ENCOUNTER — Encounter: Payer: Self-pay | Admitting: Occupational Therapy

## 2019-04-03 ENCOUNTER — Ambulatory Visit: Payer: 59 | Attending: Orthopaedic Surgery | Admitting: Occupational Therapy

## 2019-04-03 DIAGNOSIS — M79602 Pain in left arm: Secondary | ICD-10-CM | POA: Diagnosis not present

## 2019-04-03 DIAGNOSIS — M6281 Muscle weakness (generalized): Secondary | ICD-10-CM | POA: Insufficient documentation

## 2019-04-03 DIAGNOSIS — M25622 Stiffness of left elbow, not elsewhere classified: Secondary | ICD-10-CM | POA: Insufficient documentation

## 2019-04-03 NOTE — Patient Instructions (Signed)
Heat to L elbow  Interlock digits and do flexion to neck on R and L - wrist straight - slight pull  10 reps  Interlock and slide hands down back of head - slight pull - stop and reach up over head  Slight pull  10reps  Supine - do AROM for elbow flexion and extention - 3 positions - 6 reps each  Slight pulle

## 2019-04-03 NOTE — Therapy (Signed)
[facility] [address], [place], [address] Phone: [phone]   Fax:  [phone]  Occupational Therapy Evaluation  Patient Details  Name: Rebecca Allen MRN: [id number] Date of Birth: 1965/09/15 Referring Provider (OT): Harlin Heys   Encounter Date: 04/03/2019  OT End of Session - 04/03/19 1715    Visit Number  1    Number of Visits  12    Date for OT Re-Evaluation  05/15/19    OT Start Time  1300    OT Stop Time  1349    OT Time Calculation (min)  49 min    Activity Tolerance  Patient tolerated treatment well    Behavior During Therapy  [facility] for tasks assessed/performed       Past Medical History:  Diagnosis Date  . Depression   . GERD (gastroesophageal reflux disease)   . Hyperlipidemia   . Treadmill stress test negative for angina pectoris 12/18/2010    Past Surgical History:  Procedure Laterality Date  . ABDOMINAL HYSTERECTOMY    . CHOLECYSTECTOMY    . COLONOSCOPY    . PARTIAL HYSTERECTOMY  08-1999   abdominal    There were no vitals filed for this visit.  Subjective Assessment - 04/03/19 1356    Subjective   I trip over towels and fell on my elbow more than month ago and it still hurts - I seen a ortho MD 2wks ago - also took xray - that showed no fracture - he gave me this strap to wear on inside of my elbow    Pertinent History  Pt had fell off low deck on elbow- 02/26/2019 - seen MD - refer to hand therapy - seen ortho 2 wks ago -repeat xray - no fracture - but still pain in the elbow and cannot lift,pick up , pull or hold - cannot bend elbow    Patient Stated Goals  I want the pain and range of motion in my L dominant elbow better so I can use it without problems    Currently in Pain?  Yes    Pain Score  7     Pain Location  Elbow    Pain Orientation  Left    Pain Descriptors / Indicators  Aching;Tightness;Penetrating;Cramping    Pain Type  Acute pain    Pain Onset  More  than a month ago    Pain Frequency  Constant        [facility] OT Assessment - 04/03/19 0001      Assessment   Medical Diagnosis  L elbow pain     Referring Provider (OT)  creighton, james    Onset Date/Surgical Date  02/26/19    Hand Dominance  Left    Next MD Visit  --   in month     [place] With  Family      Prior Function   Vocation  Full time employment    Leisure  phlebotomist, likes to back , garden,       AROM   Left Elbow Flexion  --   palm up 90, 110 pain increase to 8/10    Left Elbow Extension  --   -18 palm up(pain )  , 0 other 2 positions    Left Forearm Pronation  90 Degrees   end range   Left Forearm Supination  90 Degrees   pain end range  Heat to L elbow done in clinic - and to do at home :  Prior to ROM    Interlock digits and do flexion to neck on R and L - wrist straight - slight pull  10 reps  Interlock and slide hands down back of head - slight pull - stop and reach up over head  Slight pull  10reps  Supine - do AROM for elbow flexion and extention - 3 positions - 6 reps each  Slight pulle              OT Education - 04/03/19 1714    Education Details  findings of eval and HEP    Person(s) Educated  Patient    Methods  Explanation;Demonstration;Tactile cues;Verbal cues;Handout    Comprehension  Returned demonstration;Verbalized understanding       OT Short Term Goals - 04/03/19 1721      OT SHORT TERM GOAL #1   Title  Pt to be independent in HEP to decrease pain and increase ROM in L elbow to show increase function on PREE    Baseline  function score PREE 29/50 and pain 42/50 - no knowledge    Period  Weeks    Status  New    Target Date  04/24/19        OT Long Term Goals - 04/03/19 1723      OT LONG TERM GOAL #1   Title  Pt L elbow flexion and extentoin improve to WNL without increase symptoms to do ADL's and work activities    Baseline  increase pain past -18 ext, flexion 90-110 - pain  6-10/10    Time  4    Period  Weeks    Status  New    Target Date  05/01/19      OT LONG TERM GOAL #2   Title  L elbow strength increase for pt to use hand in more than  80 % of act without increase symptoms    Baseline  cannot do any liting , pulling or pushing - pain with AROM 6-10/10    Time  6    Period  Weeks    Status  New    Target Date  05/15/19      OT LONG TERM GOAL #3   Title  Pain increase L elbow improve on PREE more than 30 points    Baseline  pain on PREE at eval 42/50    Time  6    Period  Weeks    Status  New    Target Date  05/15/19            Plan - 04/03/19 1715    Clinical Impression Statement  Pt present at OT eval about 5 wks out from fall on her elbow - pt report she had 2 xrays that showed no fracture - pt had increase pain with flexion and extention with worse pain in elbow and with bicep position during supination - feel tight feeling and pain - 6-10/10 pain - decrease extentoin -18 and flexoin 90 -110 - can force motion but pain 10/10  - decrease strength all limiting her function use of L dominant hand in ADL's and IADL's    OT Occupational Profile and History  Problem Focused Assessment - Including review of records relating to presenting problem    Occupational performance deficits (Please refer to evaluation for details):  Social Participation;Rest and Sleep;IADL's;ADL's;Work;Play;Leisure    Body Structure / Function / Physical Skills  ADL;Flexibility;ROM;UE functional use;Pain;Strength;IADL    Rehab Potential  Good    Clinical Decision Making  Several treatment options, min-mod task modification necessary    Comorbidities Affecting Occupational Performance:  None    Modification or Assistance to Complete Evaluation   No modification of tasks or assist necessary to complete eval    OT Frequency  2x / week    OT Duration  6 weeks    OT Treatment/Interventions  Self-care/ADL training;Therapeutic exercise;Paraffin;Moist Heat;Contrast Bath;Manual  Therapy;Passive range of motion;Patient/family education;Other (comment)   dryneedling by PT   Plan  see pt instruction       Patient will benefit from skilled therapeutic intervention in order to improve the following deficits and impairments:   Body Structure / Function / Physical Skills: ADL, Flexibility, ROM, UE functional use, Pain, Strength, IADL       Visit Diagnosis: Pain in left arm - Plan: Ot plan of care cert/re-cert  Stiffness of left elbow, not elsewhere classified - Plan: Ot plan of care cert/re-cert  Muscle weakness (generalized) - Plan: Ot plan of care cert/re-cert    Problem List Patient Active Problem List   Diagnosis Date Noted  . Left elbow pain 03/02/2019  . Persistent cough 11/15/2018  . Allergic sinusitis 11/11/2018  . Chronic insomnia 10/18/2018  . Symptomatic mammary hypertrophy 09/30/2018  . Current moderate episode of major depressive disorder without prior episode ([place]) 09/06/2018  . Lumbar sprain 06/23/2018  . Gastritis 01/10/2018  . Bilateral leg pain 09/07/2017  . Palpitations 05/25/2017  . Chronic cervical radiculopathy 07/07/2016  . Right-sided low back pain with sciatica 04/08/2016  . Lymphadenopathy 02/07/2016  . Neck pain 12/18/2014  . Post-traumatic stress 10/05/2014  . Upper back strain 05/29/2014  . Panic attacks 07/25/2013  . Back pain 05/30/2010  . Generalized anxiety disorder 05/16/2009  . Fatigue 05/01/2009  . PERS HX TOBACCO USE PRESENTING HAZARDS HEALTH 05/07/2008  . Mixed hyperlipidemia 03/28/2008  . COMMON MIGRAINE 03/28/2008  . GERD 03/28/2008  . COSTOCHONDRITIS, RECURRENT 03/28/2008  . Atypical chest pain 03/28/2008    Rosalyn Gess OTR/L,CLT 04/03/2019, 5:29 PM  [facility] [address], [place], [address] Phone: [phone]   Fax:  [phone]  Name: Rebecca Allen MRN: [id number] Date of Birth: 04-Nov-1965

## 2019-04-10 ENCOUNTER — Ambulatory Visit: Payer: 59 | Admitting: Occupational Therapy

## 2019-04-10 ENCOUNTER — Other Ambulatory Visit: Payer: Self-pay

## 2019-04-10 DIAGNOSIS — M6281 Muscle weakness (generalized): Secondary | ICD-10-CM

## 2019-04-10 DIAGNOSIS — M79602 Pain in left arm: Secondary | ICD-10-CM

## 2019-04-10 DIAGNOSIS — M25622 Stiffness of left elbow, not elsewhere classified: Secondary | ICD-10-CM

## 2019-04-10 NOTE — Patient Instructions (Signed)
Same HEP but use counter force splint over distal biceps - with AROM to decrease pain  And pick up or carry object in palm neutral and pronation position

## 2019-04-10 NOTE — Therapy (Signed)
[facility] [facility] [address], [place], [address] Phone: [phone]   Fax:  [phone]  Occupational Therapy Treatment  Patient Details  Name: Rebecca Allen MRN: [id number] Date of Birth: May 10, 1966 Referring Provider (OT): Harlin Heys   Encounter Date: 04/10/2019  OT End of Session - 04/10/19 1347    Visit Number  2    Number of Visits  12    Date for OT Re-Evaluation  05/15/19    OT Start Time  1302    OT Stop Time  1341    OT Time Calculation (min)  39 min    Activity Tolerance  Patient tolerated treatment well    Behavior During Therapy  [facility] for tasks assessed/performed       Past Medical History:  Diagnosis Date  . Depression   . GERD (gastroesophageal reflux disease)   . Hyperlipidemia   . Treadmill stress test negative for angina pectoris 12/18/2010    Past Surgical History:  Procedure Laterality Date  . ABDOMINAL HYSTERECTOMY    . CHOLECYSTECTOMY    . COLONOSCOPY    . PARTIAL HYSTERECTOMY  08-1999   abdominal    There were no vitals filed for this visit.  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  Yes    Pain Score  8     Pain Location  Elbow    Pain Orientation  Left    Pain Descriptors / Indicators  Aching    Pain Type  Acute pain    Pain Onset  More than a month ago         [facility] OT  Assessment - 04/10/19 0001      AROM   Left Elbow Flexion  --   124 palm down, and thumb up , palm up 90 and 8-10/10 pain    Left Elbow Extension  0    Left Forearm Pronation  90 Degrees    Left Forearm Supination  90 Degrees       Progress in sup , pron with less pain  And AROM with hand in neutral and pronation  But cont to be same in biceps position - pain   but decrease with simulating counter force splint /strap over distal biceps  Pt to wear during day to decrease pain and increase pain free AROM   Elbow extention improve to 0   Done some soft tissue - graston tool sweeping over biceps and triceps  And volar forearm  Soft tissue massage by OT over volar forearm and upper arm  Tolerate well   pt to see MD and ask for PT to do dry needling to biceps /brachioradialis  OT Treatments/Exercises (OP) - 04/10/19 0001      LUE Paraffin   Number Minutes Paraffin  8 Minutes    LUE Paraffin Location  --   L elbow   Comments  prior to soft tissue and ROM              OT Education - 04/10/19 1347    Education Details  progress and changes to HEP    Person(s) Educated  Patient    Methods  Explanation;Demonstration;Tactile cues;Verbal cues;Handout    Comprehension  Returned demonstration;Verbalized understanding       OT Short Term Goals - 04/03/19 1721      OT SHORT TERM GOAL #1   Title  Pt to be independent in HEP to decrease pain and increase ROM in L elbow to show increase function on PREE    Baseline  function score PREE 29/50 and pain 42/50 - no knowledge    Period  Weeks    Status  New    Target Date  04/24/19        OT Long Term Goals - 04/03/19 1723      OT LONG TERM GOAL #1   Title  Pt L elbow flexion and extentoin improve to WNL without increase symptoms to do ADL's and work activities    Baseline  increase pain past -18 ext, flexion 90-110 - pain 6-10/10    Time  4    Period  Weeks    Status  New    Target Date  05/01/19      OT  LONG TERM GOAL #2   Title  L elbow strength increase for pt to use hand in more than  80 % of act without increase symptoms    Baseline  cannot do any liting , pulling or pushing - pain with AROM 6-10/10    Time  6    Period  Weeks    Status  New    Target Date  05/15/19      OT LONG TERM GOAL #3   Title  Pain increase L elbow improve on PREE more than 30 points    Baseline  pain on PREE at eval 42/50    Time  6    Period  Weeks    Status  New    Target Date  05/15/19            Plan - 04/10/19 1347    Clinical Impression Statement  Pt is about 6 wks out from fall on L elbow - pain  and stiffness and xrays showed no fracture- pt show increase extention of elbow and flexion with brachialis and brachoradialise - biceps still spasm and pain - pt to see MD and will ask for order for dryneedling - pain at rest improve and AROM    OT Occupational Profile and History  Problem Focused Assessment - Including review of records relating to presenting problem    Occupational performance deficits (Please refer to evaluation for details):  Social Participation;Rest and Sleep;IADL's;ADL's;Work;Play;Leisure    Body Structure / Function / Physical Skills  ADL;Flexibility;ROM;UE functional use;Pain;Strength;IADL    Rehab Potential  Good    Clinical Decision Making  Several treatment options, min-mod task modification necessary    Comorbidities Affecting Occupational Performance:  None    Modification or Assistance to Complete Evaluation   No modification of tasks or assist necessary to complete eval    OT Frequency  2x / week    OT Duration  6 weeks    OT Treatment/Interventions  Self-care/ADL training;Therapeutic exercise;Paraffin;Moist Heat;Contrast Bath;Manual Therapy;Passive range of motion;Patient/family education;Other (comment)    Plan  see pt instruction    OT Home Exercise Plan  see pt instruction    Consulted and Agree with Plan of Care  Patient       Patient will benefit from  skilled therapeutic intervention in order to improve the following deficits and impairments:   Body Structure / Function / Physical Skills: ADL, Flexibility, ROM, UE functional use, Pain, Strength, IADL       Visit Diagnosis: Pain in left arm  Stiffness of left elbow, not elsewhere classified  Muscle weakness (generalized)    Problem List Patient Active Problem List   Diagnosis Date Noted  . Left elbow pain 03/02/2019  . Persistent cough 11/15/2018  . Allergic sinusitis 11/11/2018  . Chronic insomnia 10/18/2018  . Symptomatic mammary hypertrophy 09/30/2018  . Current moderate episode of major depressive disorder without prior episode ([place]) 09/06/2018  . Lumbar sprain 06/23/2018  . Gastritis 01/10/2018  . Bilateral leg pain 09/07/2017  . Palpitations 05/25/2017  . Chronic cervical radiculopathy 07/07/2016  . Right-sided low back pain with sciatica 04/08/2016  . Lymphadenopathy 02/07/2016  . Neck pain 12/18/2014  . Post-traumatic stress 10/05/2014  . Upper back strain 05/29/2014  . Panic attacks 07/25/2013  . Back pain 05/30/2010  . Generalized anxiety disorder 05/16/2009  . Fatigue 05/01/2009  . PERS HX TOBACCO USE PRESENTING HAZARDS HEALTH 05/07/2008  . Mixed hyperlipidemia 03/28/2008  . COMMON MIGRAINE 03/28/2008  . GERD 03/28/2008  . COSTOCHONDRITIS, RECURRENT 03/28/2008  . Atypical chest pain 03/28/2008    Rosalyn Gess OTR/L,CLT 04/10/2019, 4:54 PM  [facility] [address], [place], [address] Phone: [phone]   Fax:  [phone]  Name: KENNAN STANKOWSKI MRN: [id number] Date of Birth: 1965/08/20

## 2019-04-12 ENCOUNTER — Other Ambulatory Visit: Payer: Self-pay

## 2019-04-12 ENCOUNTER — Ambulatory Visit: Payer: 59 | Attending: Orthopaedic Surgery | Admitting: Occupational Therapy

## 2019-04-12 DIAGNOSIS — M6281 Muscle weakness (generalized): Secondary | ICD-10-CM | POA: Insufficient documentation

## 2019-04-12 DIAGNOSIS — M79602 Pain in left arm: Secondary | ICD-10-CM

## 2019-04-12 DIAGNOSIS — M25622 Stiffness of left elbow, not elsewhere classified: Secondary | ICD-10-CM | POA: Insufficient documentation

## 2019-04-12 NOTE — Therapy (Signed)
[place] [facility] [address], [place], [address] Phone: [phone]   Fax:  [phone]  Occupational Therapy Treatment  Patient Details  Name: Rebecca Allen MRN: [id number] Date of Birth: [birth year] Referring Provider (OT): Harlin Heys   Encounter Date: 04/12/2019  OT End of Session - 04/12/19 1339    Visit Number  3    Number of Visits  12    Date for OT Re-Evaluation  05/15/19    OT Start Time  1305    OT Stop Time  1331    OT Time Calculation (min)  26 min    Activity Tolerance  Patient tolerated treatment well    Behavior During Therapy  [facility] for tasks assessed/performed       Past Medical History:  Diagnosis Date  . Depression   . GERD (gastroesophageal reflux disease)   . Hyperlipidemia   . Treadmill stress test negative for angina pectoris 12/18/2010    Past Surgical History:  Procedure Laterality Date  . ABDOMINAL HYSTERECTOMY    . CHOLECYSTECTOMY    . COLONOSCOPY    . PARTIAL HYSTERECTOMY  08-1999   abdominal    There were no vitals filed for this visit.  Subjective Assessment - 04/12/19 1334    Subjective   I seen the Dr this am - gave me a shot back of elbow - I tried that strap around my arm - but think I had it wrong - pain was 10/10 when I went to see MD- he said you could do dryneedling    Pertinent History  Pt had fell off low deck on elbow- 02/26/2019 - seen MD - refer to hand therapy - seen ortho 2 wks ago -repeat xray - no fracture - but still pain in the elbow and cannot lift,pick up , pull or hold - cannot bend elbow    Patient Stated Goals  I want the pain and range of motion in my L dominant elbow better so I can use it without problems    Currently in Pain?  Yes    Pain Score  7     Pain Location  Elbow    Pain Orientation  Left    Pain Descriptors / Indicators  Aching    Pain Type  Acute pain    Pain Onset  More than a month ago    Pain Frequency  Constant          [facility] OT Assessment - 04/12/19 0001      AROM   Left Elbow Flexion  --   Thumb up 140, palm down 130, biceps strap over dist tendon   Left Elbow Extension  0    Left Forearm Pronation  90 Degrees    Left Forearm Supination  90 Degrees       Pt arrive with increase AROM - but pain still 7/10 with AROM - and per pt at rest Pt did not had counter force strap this past 2 days on correct spot - distal biceps tendon   pt educated again and to wear hour or 2 at time , then off    Wrist AROM in all planes , sup , pronation WNL - pain free   PT done Dry needling in biceps - 2 x - and pt tolerate with out issues   AROM done for elbow in all 3 positions for flexion , ext - 10 reps  Strap on distal biceps with flexion palm  up  Ice at end 3-4 min          OT Treatments/Exercises (OP) - 04/12/19 0001      Cryotherapy   Number Minutes Cryotherapy  4 Minutes    Cryotherapy Location  --   elbow and over biceps   Type of Cryotherapy  Ice pack             OT Education - 04/12/19 1339    Education Details  progress and changes to HEP- explain dryneedling    Person(s) Educated  Patient    Methods  Explanation;Demonstration;Tactile cues;Verbal cues;Handout    Comprehension  Returned demonstration;Verbalized understanding       OT Short Term Goals - 04/03/19 1721      OT SHORT TERM GOAL #1   Title  Pt to be independent in HEP to decrease pain and increase ROM in L elbow to show increase function on PREE    Baseline  function score PREE 29/50 and pain 42/50 - no knowledge    Period  Weeks    Status  New    Target Date  04/24/19        OT Long Term Goals - 04/03/19 1723      OT LONG TERM GOAL #1   Title  Pt L elbow flexion and extentoin improve to WNL without increase symptoms to do ADL's and work activities    Baseline  increase pain past -18 ext, flexion 90-110 - pain 6-10/10    Time  4    Period  Weeks    Status  New    Target Date  05/01/19      OT LONG  TERM GOAL #2   Title  L elbow strength increase for pt to use hand in more than  80 % of act without increase symptoms    Baseline  cannot do any liting , pulling or pushing - pain with AROM 6-10/10    Time  6    Period  Weeks    Status  New    Target Date  05/15/19      OT LONG TERM GOAL #3   Title  Pain increase L elbow improve on PREE more than 30 points    Baseline  pain on PREE at eval 42/50    Time  6    Period  Weeks    Status  New    Target Date  05/15/19            Plan - 04/12/19 1340    Clinical Impression Statement  Pt is 6 1/2 wks out from fall on L elbow - pt seen MD this am and received cortizone shot posterior elbow. MD provided order for dryneedling- PT [facility]) done over biceps this date , pt show increase AROM in elbow flexion and extentoin but cont to have pain - pt doing liting and pulling at work - pt will be on vacation next week for 9 days - pt to work on AROM and decreaseing pain - no ligting ,pulling or pushing - use counter force strap over distal biceps    OT Occupational Profile and History  Problem Focused Assessment - Including review of records relating to presenting problem    Occupational performance deficits (Please refer to evaluation for details):  Social Participation;Rest and Sleep;IADL's;ADL's;Work;Play;Leisure    Body Structure / Function / Physical Skills  ADL;Flexibility;ROM;UE functional use;Pain;Strength;IADL    Rehab Potential  Good    Clinical Decision Making  Several treatment  options, min-mod task modification necessary    Comorbidities Affecting Occupational Performance:  None    Modification or Assistance to Complete Evaluation   No modification of tasks or assist necessary to complete eval    OT Frequency  2x / week    OT Duration  6 weeks    OT Treatment/Interventions  Self-care/ADL training;Therapeutic exercise;Paraffin;Moist Heat;Contrast Bath;Manual Therapy;Passive range of motion;Patient/family education;Other (comment)     Plan  assess if pain decrease and AROM in elbow    OT Home Exercise Plan  see pt instruction    Consulted and Agree with Plan of Care  Patient       Patient will benefit from skilled therapeutic intervention in order to improve the following deficits and impairments:   Body Structure / Function / Physical Skills: ADL, Flexibility, ROM, UE functional use, Pain, Strength, IADL       Visit Diagnosis: Pain in left arm  Stiffness of left elbow, not elsewhere classified  Muscle weakness (generalized)    Problem List Patient Active Problem List   Diagnosis Date Noted  . Left elbow pain 03/02/2019  . Persistent cough 11/15/2018  . Allergic sinusitis 11/11/2018  . Chronic insomnia 10/18/2018  . Symptomatic mammary hypertrophy 09/30/2018  . Current moderate episode of major depressive disorder without prior episode ([place]) 09/06/2018  . Lumbar sprain 06/23/2018  . Gastritis 01/10/2018  . Bilateral leg pain 09/07/2017  . Palpitations 05/25/2017  . Chronic cervical radiculopathy 07/07/2016  . Right-sided low back pain with sciatica 04/08/2016  . Lymphadenopathy 02/07/2016  . Neck pain 12/18/2014  . Post-traumatic stress 10/05/2014  . Upper back strain 05/29/2014  . Panic attacks 07/25/2013  . Back pain 05/30/2010  . Generalized anxiety disorder 05/16/2009  . Fatigue 05/01/2009  . PERS HX TOBACCO USE PRESENTING HAZARDS HEALTH 05/07/2008  . Mixed hyperlipidemia 03/28/2008  . COMMON MIGRAINE 03/28/2008  . GERD 03/28/2008  . COSTOCHONDRITIS, RECURRENT 03/28/2008  . Atypical chest pain 03/28/2008    Rosalyn Gess OTR/L,CLT 04/12/2019, 1:44 PM  [place] [facility] [address], [place], [address] Phone: [phone]   Fax:  [phone]  Name: YASMEN GALLICK MRN: [id number] Date of Birth: [birth year]

## 2019-04-12 NOTE — Patient Instructions (Signed)
L AROM of elbow flexion and extention - stop when feeling pull  But use counter force splint on distal biceps , prox forearm  Heat and ice as needed   No lifting , pulling and pushing - pt on vacation for 9 days

## 2019-04-24 ENCOUNTER — Ambulatory Visit: Payer: 59 | Admitting: Occupational Therapy

## 2019-04-27 ENCOUNTER — Ambulatory Visit: Payer: 59 | Admitting: Occupational Therapy

## 2019-05-08 ENCOUNTER — Ambulatory Visit: Payer: 59 | Admitting: Occupational Therapy

## 2019-05-15 ENCOUNTER — Other Ambulatory Visit: Payer: Self-pay | Admitting: Obstetrics and Gynecology

## 2019-05-15 DIAGNOSIS — Z90722 Acquired absence of ovaries, bilateral: Secondary | ICD-10-CM

## 2019-05-15 DIAGNOSIS — E8941 Symptomatic postprocedural ovarian failure: Secondary | ICD-10-CM

## 2019-05-23 ENCOUNTER — Encounter: Payer: Self-pay | Admitting: Family Medicine

## 2019-05-23 ENCOUNTER — Ambulatory Visit (INDEPENDENT_AMBULATORY_CARE_PROVIDER_SITE_OTHER)
Admission: RE | Admit: 2019-05-23 | Discharge: 2019-05-23 | Disposition: A | Discharge status: Home / Self Care | Payer: 59 | Source: Ambulatory Visit | Attending: Family Medicine | Admitting: Family Medicine

## 2019-05-23 ENCOUNTER — Other Ambulatory Visit: Payer: Self-pay

## 2019-05-23 ENCOUNTER — Ambulatory Visit: Payer: 59 | Admitting: Family Medicine

## 2019-05-23 ENCOUNTER — Other Ambulatory Visit: Payer: Self-pay | Admitting: Family Medicine

## 2019-05-23 VITALS — BP 110/80 | HR 75 | Temp 98.1°F | Ht 65.5 in | Wt 177.5 lb

## 2019-05-23 DIAGNOSIS — M25572 Pain in left ankle and joints of left foot: Secondary | ICD-10-CM | POA: Diagnosis not present

## 2019-05-23 DIAGNOSIS — M79672 Pain in left foot: Secondary | ICD-10-CM | POA: Diagnosis not present

## 2019-05-23 DIAGNOSIS — S92352A Displaced fracture of fifth metatarsal bone, left foot, initial encounter for closed fracture: Secondary | ICD-10-CM

## 2019-05-23 MED ORDER — PANTOPRAZOLE SODIUM 40 MG PO TBEC: 40.0000 mg | DELAYED_RELEASE_TABLET | Freq: Every day | ORAL | 3 refills | Status: DC

## 2019-05-23 NOTE — Progress Notes (Signed)
Referral sent 

## 2019-05-23 NOTE — Progress Notes (Signed)
Chief Complaint  Patient presents with  . Foot Injury    History of Present Illness: HPI    53 year old female presents with new onset foot injury. She reports 3 days ago when dancing twirled and fell.  Twisted left ankle and foot severely. Husband said foot was essentially turned backward.  Unable to weight bear the first day.  Treated with elevation, ice, ibuprofen/tylenol.   Has had resulting pain and bruising, swelling.   Went to work yesterday but had to sit.  Pain is located in left lateral ankle and foot, pain radiates to lower leg and around to medial ankle.  4th and 5th digit decreased sensation and difficult to move.   COVID 19 screen No recent travel or known exposure to COVID19 The patient denies respiratory symptoms of COVID 19 at this time.  The importance of social distancing was discussed today.   Review of Systems  Constitutional: Negative for chills and fever.  HENT: Negative for congestion and ear pain.   Eyes: Negative for pain and redness.  Respiratory: Negative for cough and shortness of breath.   Cardiovascular: Negative for chest pain, palpitations and leg swelling.  Gastrointestinal: Negative for abdominal pain, blood in stool, constipation, diarrhea, nausea and vomiting.  Genitourinary: Negative for dysuria.  Musculoskeletal: Negative for falls and myalgias.  Skin: Negative for rash.  Neurological: Negative for dizziness.  Psychiatric/Behavioral: Negative for depression. The patient is not nervous/anxious.       Past Medical History:  Diagnosis Date  . Depression   . GERD (gastroesophageal reflux disease)   . Hyperlipidemia   . Treadmill stress test negative for angina pectoris 12/18/2010    reports that she has been smoking cigarettes. She has a 7.50 pack-year smoking history. She has never used smokeless tobacco. She reports current alcohol use of about 2.0 standard drinks of alcohol per week. She reports that she does not use drugs.    Current Outpatient Medications:  .  ALPRAZolam (XANAX) 0.5 MG tablet, Take 0.5-1 tablets (0.25-0.5 mg total) by mouth daily as needed for anxiety., Disp: 30 tablet, Rfl: 0 .  calcium carbonate (TUMS - DOSED IN MG ELEMENTAL CALCIUM) 500 MG chewable tablet, Chew 6 tablets by mouth daily as needed for indigestion or heartburn., Disp: , Rfl:  .  escitalopram (LEXAPRO) 5 MG tablet, Take 0.5 tablets (2.5 mg total) by mouth daily., Disp: 30 tablet, Rfl: 3 .  estradiol (VIVELLE-DOT) 0.05 MG/24HR patch, PLACE 1 PATCH TRANS-DERMALLY TWICE A WEEK. REMOVE OLD PATCH BEFORE APPLYING NEW PATCH., Disp: 8 patch, Rfl: 11 .  pantoprazole (PROTONIX) 40 MG tablet, Take 40 mg by mouth daily., Disp: , Rfl:    Observations/Objective: Height 5' 5.5" (1.664 m).  Physical Exam Constitutional:      General: She is not in acute distress.    Appearance: Normal appearance. She is well-developed. She is not ill-appearing or toxic-appearing.  HENT:     Head: Normocephalic.     Right Ear: Hearing, tympanic membrane, ear canal and external ear normal. Tympanic membrane is not erythematous, retracted or bulging.     Left Ear: Hearing, tympanic membrane, ear canal and external ear normal. Tympanic membrane is not erythematous, retracted or bulging.     Nose: No mucosal edema or rhinorrhea.     Right Sinus: No maxillary sinus tenderness or frontal sinus tenderness.     Left Sinus: No maxillary sinus tenderness or frontal sinus tenderness.     Mouth/Throat:     Pharynx: Uvula midline.  Eyes:  General: Lids are normal. Lids are everted, no foreign bodies appreciated.     Conjunctiva/sclera: Conjunctivae normal.     Pupils: Pupils are equal, round, and reactive to light.  Neck:     Musculoskeletal: Normal range of motion and neck supple.     Thyroid: No thyroid mass or thyromegaly.     Vascular: No carotid bruit.     Trachea: Trachea normal.  Cardiovascular:     Rate and Rhythm: Normal rate and regular rhythm.      Pulses: Normal pulses.          Dorsalis pedis pulses are 2+ on the right side and 2+ on the left side.       Posterior tibial pulses are 2+ on the right side and 2+ on the left side.     Heart sounds: Normal heart sounds, S1 normal and S2 normal. No murmur. No friction rub. No gallop.   Pulmonary:     Effort: Pulmonary effort is normal. No tachypnea or respiratory distress.     Breath sounds: Normal breath sounds. No decreased breath sounds, wheezing, rhonchi or rales.  Abdominal:     General: Bowel sounds are normal.     Palpations: Abdomen is soft.     Tenderness: There is no abdominal tenderness.  Musculoskeletal:     Right foot: Normal range of motion.     Left foot: Decreased range of motion.  Feet:     Right foot:     Skin integrity: Skin integrity normal.     Toenail Condition: Right toenails are normal.     Left foot:     Toenail Condition: Left toenails are normal.     Comments: ttp over left latreal ankle inferior, anterior and posterior to lateral malleolus, no heel pain, no achilles pain  diffucse swelling in left lateral forefoot and ankle, mild contusion.  ttp over midfoot and 4th and 5th digit... unable to move toes  ttp over base of 5th metatarsal. Skin:    General: Skin is warm and dry.     Findings: No rash.  Neurological:     Mental Status: She is alert.  Psychiatric:        Mood and Affect: Mood is not anxious or depressed.        Speech: Speech normal.        Behavior: Behavior normal. Behavior is cooperative.        Thought Content: Thought content normal.        Judgment: Judgment normal.      Assessment and Plan   Left foot and ankle pain following twisting injury: Fairly severe foot and ankle sprain.  No clear fracture on X-rays... send for radiologist review.  Air cast, open toe shoes, elevation, ice and tylenol for pain as unable to tolerate (NSAIDS).  if not improving given severity of sprain.. follow up with D.r Copland, sports med.   Eliezer Lofts, MD

## 2019-05-23 NOTE — Patient Instructions (Signed)
Ice and elevate foot.  Limit weight bearing.  Wear air cast when up on feet.  Start home physical therapy and stretching.  Tylenol for pain given not tolerating ibuprofen.  Follow up with Dr. Lorelei Pont if not improving next Monday given severity of sprain.

## 2019-05-25 ENCOUNTER — Telehealth: Payer: Self-pay

## 2019-05-25 MED ORDER — HYDROCODONE-ACETAMINOPHEN 5-325 MG PO TABS: 1.0000 | ORAL_TABLET | Freq: Four times a day (QID) | ORAL | 0 refills | Status: DC | PRN

## 2019-05-25 NOTE — Telephone Encounter (Signed)
Pt left v/m; pt seen 05/23/19 and pt stated has foot fx that tylenol and ibuprofen is not helping pain and pt request med for pain that is stronger than tylenol and ibuprofen to [facility].

## 2019-05-25 NOTE — Telephone Encounter (Signed)
Rebecca Allen was seen by Sampson Si yesterday and they put her in a black boot.  She states she is miserable.  She tolerates Tramadol if she takes it during the day but it keeps her up at night.

## 2019-05-25 NOTE — Telephone Encounter (Signed)
Has she seen ortho yet? When is appt?  Does she tolerate tramadol?

## 2019-05-25 NOTE — Telephone Encounter (Signed)
Yes,  she would like maybe a 7 day supply of the Vicodin sent to her pharmacy.

## 2019-05-25 NOTE — Telephone Encounter (Signed)
Does she tolerate vicodin better

## 2019-08-11 ENCOUNTER — Encounter: Payer: Self-pay | Admitting: Emergency Medicine

## 2019-08-11 ENCOUNTER — Emergency Department
Admission: EM | Admit: 2019-08-11 | Discharge: 2019-08-11 | Disposition: A | Discharge status: Home / Self Care | Payer: 59 | Attending: Emergency Medicine | Admitting: Emergency Medicine

## 2019-08-11 ENCOUNTER — Other Ambulatory Visit: Payer: Self-pay

## 2019-08-11 ENCOUNTER — Emergency Department: Payer: 59

## 2019-08-11 DIAGNOSIS — R079 Chest pain, unspecified: Secondary | ICD-10-CM | POA: Diagnosis present

## 2019-08-11 DIAGNOSIS — K219 Gastro-esophageal reflux disease without esophagitis: Secondary | ICD-10-CM | POA: Insufficient documentation

## 2019-08-11 DIAGNOSIS — R0789 Other chest pain: Secondary | ICD-10-CM | POA: Diagnosis not present

## 2019-08-11 DIAGNOSIS — R0602 Shortness of breath: Secondary | ICD-10-CM | POA: Diagnosis not present

## 2019-08-11 DIAGNOSIS — Z79899 Other long term (current) drug therapy: Secondary | ICD-10-CM | POA: Insufficient documentation

## 2019-08-11 LAB — HEPATIC FUNCTION PANEL
ALT: 79 U/L — ABNORMAL HIGH (ref 0–44)
AST: 43 U/L — ABNORMAL HIGH (ref 15–41)
Albumin: 4.2 g/dL (ref 3.5–5.0)
Alkaline Phosphatase: 59 U/L (ref 38–126)
Bilirubin, Direct: <0.1 mg/dL (ref 0.0–0.2)
Total Bilirubin: 0.5 mg/dL (ref 0.3–1.2)
Total Protein: 7.2 g/dL (ref 6.5–8.1)

## 2019-08-11 LAB — BASIC METABOLIC PANEL
Anion gap: 11 (ref 5–15)
BUN: 17 mg/dL (ref 6–20)
CO2: 26 mmol/L (ref 22–32)
Calcium: 9.6 mg/dL (ref 8.9–10.3)
Chloride: 104 mmol/L (ref 98–111)
Creatinine, Ser: 0.82 mg/dL (ref 0.44–1.00)
GFR calc Af Amer: >60 mL/min (ref >60)
GFR calc non Af Amer: >60 mL/min (ref >60)
Glucose, Bld: 111 mg/dL — ABNORMAL HIGH (ref 70–99)
Potassium: 3.8 mmol/L (ref 3.5–5.1)
Sodium: 141 mmol/L (ref 135–145)

## 2019-08-11 LAB — CBC
HCT: 41.4 % (ref 36.0–46.0)
Hemoglobin: 13.7 g/dL (ref 12.0–15.0)
MCH: 30.6 pg (ref 26.0–34.0)
MCHC: 33.1 g/dL (ref 30.0–36.0)
MCV: 92.6 fL (ref 80.0–100.0)
Platelets: 287 10*3/uL (ref 150–400)
RBC: 4.47 MIL/uL (ref 3.87–5.11)
RDW: 13.2 % (ref 11.5–15.5)
WBC: 8.7 10*3/uL (ref 4.0–10.5)
nRBC: 0 % (ref 0.0–0.2)

## 2019-08-11 LAB — TROPONIN I (HIGH SENSITIVITY)
Troponin I (High Sensitivity): 4 ng/L (ref <18)
Troponin I (High Sensitivity): <2 ng/L (ref <18)

## 2019-08-11 LAB — LIPASE, BLOOD: Lipase: 34 U/L (ref 11–51)

## 2019-08-11 MED ORDER — ALUM & MAG HYDROXIDE-SIMETH 200-200-20 MG/5ML PO SUSP
30.0000 mL | Freq: Once | ORAL | Status: AC
  Administered 2019-08-11: 18:00:00 30 mL via ORAL
  Filled 2019-08-11: qty 30

## 2019-08-11 MED ORDER — SUCRALFATE 1 G PO TABS: 1.0000 g | ORAL_TABLET | Freq: Four times a day (QID) | ORAL | 1 refills | Status: DC

## 2019-08-11 MED ORDER — FAMOTIDINE 20 MG PO TABS
40.0000 mg | ORAL_TABLET | Freq: Once | ORAL | Status: AC
  Administered 2019-08-11: 40 mg via ORAL
  Filled 2019-08-11: qty 2

## 2019-08-11 MED ORDER — FAMOTIDINE 20 MG PO TABS: 20.0000 mg | ORAL_TABLET | Freq: Two times a day (BID) | ORAL | 0 refills | Status: DC

## 2019-08-11 MED ORDER — ONDANSETRON 4 MG PO TBDP: 4.0000 mg | ORAL_TABLET | Freq: Three times a day (TID) | ORAL | 0 refills | Status: DC | PRN

## 2019-08-11 MED ORDER — SODIUM CHLORIDE 0.9 % IV BOLUS
1000.0000 mL | Freq: Once | INTRAVENOUS | Status: AC
  Administered 2019-08-11: 18:00:00 1000 mL via INTRAVENOUS

## 2019-08-11 MED ORDER — ONDANSETRON HCL 4 MG/2ML IJ SOLN
4.0000 mg | Freq: Once | INTRAMUSCULAR | Status: AC
  Administered 2019-08-11: 4 mg via INTRAVENOUS
  Filled 2019-08-11: qty 2

## 2019-08-11 MED ORDER — IOHEXOL 350 MG/ML SOLN
100.0000 mL | Freq: Once | INTRAVENOUS | Status: AC | PRN
  Administered 2019-08-11: 19:00:00 100 mL via INTRAVENOUS

## 2019-08-11 MED ORDER — FENTANYL CITRATE (PF) 100 MCG/2ML IJ SOLN
50.0000 ug | Freq: Once | INTRAMUSCULAR | Status: AC
  Administered 2019-08-11: 19:00:00 50 ug via INTRAVENOUS
  Filled 2019-08-11: qty 2

## 2019-08-11 NOTE — ED Provider Notes (Signed)
[facility] Emergency Department Provider Note  ____________________________________________  Time seen: Approximately 5:55 PM  I have reviewed the triage vital signs and the nursing notes.   HISTORY  Chief Complaint Chest Pain and Shortness of Breath    HPI Rebecca Jerilynn Mages Hopfer is a 54 y.o. female with a history of GERD, hyperlipidemia, anxiety who comes the ED complaining of central chest pain radiating to left shoulder associate with shortness of breath.  Worse with cough which is nonproductive and deep breathing as well as movement.  Associated with feeling like the left arm is weakened.  No dizziness or syncope.  No diarrhea or black stool.  Pain is constant, onset roughly 2 PM today.  She initially reports that she is not currently taking any daily medications except for a estradiol patch.  Then on questioning about GERD treatment in the past she reports that she takes Protonix twice daily.  Review of electronic medical records does show that she has suffered from GERD in the past, started on Protonix and Carafate earlier this year and then had an EGD in the summer which revealed chronic gastritis and it was recommended for continuation of PPI therapy.  Patient did drink alcohol last night celebrating the new year and had an episode of vomiting this morning.    Past Medical History:  Diagnosis Date  . Depression   . GERD (gastroesophageal reflux disease)   . Hyperlipidemia   . Treadmill stress test negative for angina pectoris 12/18/2010     Patient Active Problem List   Diagnosis Date Noted  . Left elbow pain 03/02/2019  . Persistent cough 11/15/2018  . Allergic sinusitis 11/11/2018  . Chronic insomnia 10/18/2018  . Symptomatic mammary hypertrophy 09/30/2018  . Current moderate episode of major depressive disorder without prior episode ([place]) 09/06/2018  . Lumbar sprain 06/23/2018  . Gastritis 01/10/2018  . Bilateral leg pain 09/07/2017  .  Palpitations 05/25/2017  . Chronic cervical radiculopathy 07/07/2016  . Right-sided low back pain with sciatica 04/08/2016  . Lymphadenopathy 02/07/2016  . Neck pain 12/18/2014  . Post-traumatic stress 10/05/2014  . Upper back strain 05/29/2014  . Panic attacks 07/25/2013  . Back pain 05/30/2010  . Generalized anxiety disorder 05/16/2009  . Fatigue 05/01/2009  . PERS HX TOBACCO USE PRESENTING HAZARDS HEALTH 05/07/2008  . Mixed hyperlipidemia 03/28/2008  . COMMON MIGRAINE 03/28/2008  . GERD 03/28/2008  . COSTOCHONDRITIS, RECURRENT 03/28/2008  . Atypical chest pain 03/28/2008     Past Surgical History:  Procedure Laterality Date  . ABDOMINAL HYSTERECTOMY    . CHOLECYSTECTOMY    . COLONOSCOPY    . PARTIAL HYSTERECTOMY  08-1999   abdominal     Prior to Admission medications   Medication Sig Start Date End Date Taking? Authorizing Provider  ALPRAZolam Duanne Moron) 0.5 MG tablet Take 0.5-1 tablets (0.25-0.5 mg total) by mouth daily as needed for anxiety. 10/18/18   Bedsole, Amy E, MD  calcium carbonate (TUMS - DOSED IN MG ELEMENTAL CALCIUM) 500 MG chewable tablet Chew 6 tablets by mouth daily as needed for indigestion or heartburn.    [provider]  estradiol (VIVELLE-DOT) 0.05 MG/24HR patch PLACE 1 PATCH TRANS-DERMALLY TWICE A WEEK. REMOVE OLD PATCH BEFORE APPLYING NEW PATCH. 05/15/19   Schuman, Stefanie Libel, MD  famotidine (PEPCID) 20 MG tablet Take 1 tablet (20 mg total) by mouth 2 (two) times daily. 08/11/19   Carrie Mew, MD  HYDROcodone-acetaminophen (NORCO/VICODIN) 5-325 MG tablet Take 1 tablet by mouth every 6 (six) hours as  needed for moderate pain. 05/25/19   Bedsole, Amy E, MD  ondansetron (ZOFRAN ODT) 4 MG disintegrating tablet Take 1 tablet (4 mg total) by mouth every 8 (eight) hours as needed for nausea or vomiting. 08/11/19   Carrie Mew, MD  pantoprazole (PROTONIX) 40 MG tablet Take 1 tablet (40 mg total) by mouth daily. 05/23/19   Bedsole, Amy E, MD   sucralfate (CARAFATE) 1 g tablet Take 1 tablet (1 g total) by mouth 4 (four) times daily. 08/11/19   Carrie Mew, MD     Allergies Metoclopramide hcl, Morphine and related, Penicillins, and Venlafaxine   Family History  Problem Relation Age of Onset  . COPD Mother   . Heart attack Father 5  . Hypertension Father   . Heart disease Father        stent placement   . Leukemia Brother   . Kidney failure Maternal Grandfather   . COPD Paternal Grandmother   . Congenital heart disease Sister   . Breast cancer Neg Hx     Social History Social History   Tobacco Use  . Smoking status: Current Every Day Smoker    Packs/day: 0.25    Years: 30.00    Pack years: 7.50    Types: Cigarettes  . Smokeless tobacco: Never Used  Substance Use Topics  . Alcohol use: Yes    Alcohol/week: 2.0 standard drinks    Types: 1 Glasses of wine, 1 Cans of beer per week    Comment: beer ocassionally  . Drug use: No    Review of Systems  Constitutional:   No fever or chills.  ENT:   No sore throat. No rhinorrhea. Cardiovascular: Positive chest pain without syncope. Respiratory:   No dyspnea or cough. Gastrointestinal: Positive epigastric abdominal pain and vomiting.  Musculoskeletal:   Negative for focal pain or swelling All other systems reviewed and are negative except as documented above in ROS and HPI.  ____________________________________________   PHYSICAL EXAM:  VITAL SIGNS: ED Triage Vitals  Enc Vitals Group     BP 08/11/19 1626 (!) 128/35     Pulse Rate 08/11/19 1626 88     Resp 08/11/19 1626 (!) 22     Temp 08/11/19 1626 98.7 F (37.1 C)     Temp Source 08/11/19 1626 Oral     SpO2 08/11/19 1626 97 %     Weight 08/11/19 1622 174 lb (78.9 kg)     Height 08/11/19 1622 5\' 5"  (1.651 m)     Head Circumference --      Peak Flow --      Pain Score 08/11/19 1622 10     Pain Loc --      Pain Edu? --      Excl. in [place]? --     Vital signs reviewed, nursing assessments  reviewed.   Constitutional:   Alert and oriented. Non-toxic appearance. Eyes:   Conjunctivae are normal. EOMI. PERRL. ENT      Head:   Normocephalic and atraumatic.      Nose:   Wearing a mask.      Mouth/Throat:   Wearing a mask.      Neck:   No meningismus. Full ROM. Hematological/Lymphatic/Immunilogical:   No cervical lymphadenopathy. Cardiovascular:   RRR. Symmetric bilateral radial and DP pulses.  No murmurs. Cap refill less than 2 seconds. Respiratory:   Normal respiratory effort without tachypnea/retractions. Breath sounds are clear and equal bilaterally. No wheezes/rales/rhonchi. Gastrointestinal:   Soft with epigastric tenderness. Non distended.  There is no CVA tenderness.  No rebound, rigidity, or guarding. Musculoskeletal:   Normal range of motion in all extremities. No joint effusions.  No lower extremity tenderness.  No edema.  Anterior chest wall tender to the touch as demonstrated by patient which does reproduce pain. Neurologic:   Normal speech and language.  Motor grossly intact. No acute focal neurologic deficits are appreciated.  Skin:    Skin is warm, dry and intact. No rash noted.  No petechiae, purpura, or bullae.  ____________________________________________    LABS (pertinent positives/negatives) (all labs ordered are listed, but only abnormal results are displayed) Labs Reviewed  BASIC METABOLIC PANEL - Abnormal; Notable for the following components:      Result Value   Glucose, Bld 111 (*)    All other components within normal limits  HEPATIC FUNCTION PANEL - Abnormal; Notable for the following components:   AST 43 (*)    ALT 79 (*)    All other components within normal limits  CBC  LIPASE, BLOOD  TROPONIN I (HIGH SENSITIVITY)  TROPONIN I (HIGH SENSITIVITY)   ____________________________________________   EKG  Interpreted by me  Date: 08/11/2019  Rate: 81  Rhythm: normal sinus rhythm  QRS Axis: normal  Intervals: normal  ST/T Wave  abnormalities: normal  Conduction Disutrbances: none  Narrative Interpretation: unremarkable      ____________________________________________    RADIOLOGY  DG Chest 2 View  Result Date: 08/11/2019 CLINICAL DATA:  Chest pain and shortness of breath EXAM: CHEST - 2 VIEW COMPARISON:  July 12, 2018 FINDINGS: There is no pneumothorax or large pleural effusion. The heart size is normal. There is no focal infiltrate. There is no obvious acute osseous abnormality. IMPRESSION: No active cardiopulmonary disease. Electronically Signed   By: Constance Holster M.D.   On: 08/11/2019 16:51   CT Angio Chest/Abd/Pel for Dissection W and/or Wo Contrast  Result Date: 08/11/2019 CLINICAL DATA:  Chest pain. Shortness of breath. EXAM: CT ANGIOGRAPHY CHEST, ABDOMEN AND PELVIS TECHNIQUE: Multidetector CT imaging through the chest, abdomen and pelvis was performed using the standard protocol during bolus administration of intravenous contrast. Multiplanar reconstructed images and MIPs were obtained and reviewed to evaluate the vascular anatomy. CONTRAST:  1109mL OMNIPAQUE IOHEXOL 350 MG/ML SOLN COMPARISON:  None. FINDINGS: CTA CHEST FINDINGS Cardiovascular: There is no evidence for thoracic aortic dissection or aneurysm. There is no acute pulmonary embolism, however detection of smaller pulmonary emboli at the level of the segmental branches is limited by technique. There is no significant pericardial effusion. The heart size is normal. Mediastinum/Nodes: --No mediastinal or hilar lymphadenopathy. --No axillary lymphadenopathy. --No supraclavicular lymphadenopathy. --Normal thyroid gland. --The esophagus is unremarkable Lungs/Pleura: No pulmonary nodules or masses. No pleural effusion or pneumothorax. No focal airspace consolidation. No focal pleural abnormality. Musculoskeletal: No chest wall abnormality. No acute or significant osseous findings. Review of the MIP images confirms the above findings. CTA ABDOMEN AND  PELVIS FINDINGS VASCULAR Aorta: Normal caliber aorta without aneurysm, dissection, vasculitis or significant stenosis. Celiac: Patent without evidence of aneurysm, dissection, vasculitis or significant stenosis. SMA: Patent without evidence of aneurysm, dissection, vasculitis or significant stenosis. Renals: Both renal arteries are patent without evidence of aneurysm, dissection, vasculitis, fibromuscular dysplasia or significant stenosis. IMA: Patent without evidence of aneurysm, dissection, vasculitis or significant stenosis. Inflow: Patent without evidence of aneurysm, dissection, vasculitis or significant stenosis. Veins: No obvious venous abnormality within the limitations of this arterial phase study. Review of the MIP images confirms the above findings. NON-VASCULAR Hepatobiliary: There is  decreased hepatic attenuation suggestive of hepatic steatosis. Status post cholecystectomy.There is no biliary ductal dilation. Pancreas: Normal contours without ductal dilatation. No peripancreatic fluid collection. Spleen: No splenic laceration or hematoma. Adrenals/Urinary Tract: --Adrenal glands: No adrenal hemorrhage. --Right kidney/ureter: No hydronephrosis or perinephric hematoma. --Left kidney/ureter: No hydronephrosis or perinephric hematoma. --Urinary bladder: Unremarkable. Stomach/Bowel: --Stomach/Duodenum: No hiatal hernia or other gastric abnormality. Normal duodenal course and caliber. --Small bowel: No dilatation or inflammation. --Colon: No focal abnormality. --Appendix: Normal. Vascular/Lymphatic: Atherosclerotic calcification is present within the non-aneurysmal abdominal aorta, without hemodynamically significant stenosis. --No retroperitoneal lymphadenopathy. --No mesenteric lymphadenopathy. --No pelvic or inguinal lymphadenopathy. Reproductive: Status post hysterectomy. No adnexal mass. Other: No ascites or free air. The abdominal wall is normal. Musculoskeletal. No acute displaced fractures. Review of  the MIP images confirms the above findings. IMPRESSION: 1. No acute thoracic, abdominal or pelvic pathology. Specifically, no evidence for aortic dissection or aneurysm. 2. No large centrally located pulmonary embolism. 3. Hepatic steatosis. Aortic Atherosclerosis (ICD10-I70.0). Electronically Signed   By: Constance Holster M.D.   On: 08/11/2019 18:54    ____________________________________________   PROCEDURES Procedures  ____________________________________________  DIFFERENTIAL DIAGNOSIS   GERD/gastritis, dehydration, pancreatitis, viral syndrome/COVID-19, aortic dissection  CLINICAL IMPRESSION / ASSESSMENT AND PLAN / ED COURSE  Medications ordered in the ED: Medications  alum & mag hydroxide-simeth (MAALOX/MYLANTA) 200-200-20 MG/5ML suspension 30 mL (30 mLs Oral Given 08/11/19 1754)  famotidine (PEPCID) tablet 40 mg (40 mg Oral Given 08/11/19 1754)  sodium chloride 0.9 % bolus 1,000 mL (1,000 mLs Intravenous New Bag/Given 08/11/19 1753)  fentaNYL (SUBLIMAZE) injection 50 mcg (50 mcg Intravenous Given 08/11/19 1832)  ondansetron (ZOFRAN) injection 4 mg (4 mg Intravenous Given 08/11/19 1832)  iohexol (OMNIPAQUE) 350 MG/ML injection 100 mL (100 mLs Intravenous Contrast Given 08/11/19 1835)    Pertinent labs & imaging results that were available during my care of the patient were reviewed by me and considered in my medical decision making (see chart for details).  Baylynn Jerilynn Mages Allen was evaluated in Emergency Department on 08/11/2019 for the symptoms described in the history of present illness. She was evaluated in the context of the global COVID-19 pandemic, which necessitated consideration that the patient might be at risk for infection with the SARS-CoV-2 virus that causes COVID-19. Institutional protocols and algorithms that pertain to the evaluation of patients at risk for COVID-19 are in a state of rapid change based on information released by regulatory bodies including the CDC and federal  and state organizations. These policies and algorithms were followed during the patient's care in the ED.   Patient presents with rapid onset of radiating chest pain.  Given her history and the nature of the symptoms and her exam today, most likely this is an aggravation of her chronic gastritis possibly precipitated by alcohol consumption.  However, the severe radiating pain with a subjective sense of left arm weakness raises some suspicion of dissection.  Labs are unremarkable.  If patient does not have significant improvement with antacids, I will obtain a CT scan dissection protocol.  Clinical Course as of Aug 10 1950  Fri Aug 11, 2019  [id number] LFTs normal.  Troponin normal.  No improvement so far with antacids.  We will proceed with CT scan.   [PS]  1900 CT scan negative.  Waiting for second troponin after which point patient can be discharged home to continue aggressive therapy for likely GERD.  I will encourage her to continue her PPI and additionally take Pepcid for the next week which  I think should be tolerable within a short timeframe.   [PS]    Clinical Course User Index [PS] Carrie Mew, MD     ____________________________________________   FINAL CLINICAL IMPRESSION(S) / ED DIAGNOSES    Final diagnoses:  Atypical chest pain  Gastroesophageal reflux disease, unspecified whether esophagitis present     ED Discharge Orders         Ordered    sucralfate (CARAFATE) 1 g tablet  4 times daily     08/11/19 1951    famotidine (PEPCID) 20 MG tablet  2 times daily     08/11/19 1951    ondansetron (ZOFRAN ODT) 4 MG disintegrating tablet  Every 8 hours PRN     08/11/19 1951          Portions of this note were generated with dragon dictation software. Dictation errors may occur despite best attempts at proofreading.   Carrie Mew, MD 08/11/19 [phone]

## 2019-08-11 NOTE — ED Triage Notes (Signed)
Pt c/o central chest pain and SHOB.  Radiates to left shoulder blade and back.  Has had cough. No fevers.  Sx started 2-3 hours ago.  Did vomit this AM per pt; had drank alcohol last night.  No vomiting since pain started. No sweating.  Pt appears somewhat anxious.  VSS

## 2019-10-10 ENCOUNTER — Encounter: Payer: Self-pay | Admitting: Family Medicine

## 2019-10-10 ENCOUNTER — Other Ambulatory Visit: Payer: Self-pay

## 2019-10-10 ENCOUNTER — Ambulatory Visit: Payer: 59 | Admitting: Family Medicine

## 2019-10-10 VITALS — BP 100/78 | HR 90 | Temp 97.9°F | Ht 65.5 in | Wt 183.8 lb

## 2019-10-10 DIAGNOSIS — F411 Generalized anxiety disorder: Secondary | ICD-10-CM

## 2019-10-10 DIAGNOSIS — Z13220 Encounter for screening for lipoid disorders: Secondary | ICD-10-CM | POA: Diagnosis not present

## 2019-10-10 DIAGNOSIS — F410 Panic disorder [episodic paroxysmal anxiety] without agoraphobia: Secondary | ICD-10-CM

## 2019-10-10 DIAGNOSIS — F321 Major depressive disorder, single episode, moderate: Secondary | ICD-10-CM | POA: Diagnosis not present

## 2019-10-10 DIAGNOSIS — M5442 Lumbago with sciatica, left side: Secondary | ICD-10-CM

## 2019-10-10 DIAGNOSIS — R5383 Other fatigue: Secondary | ICD-10-CM

## 2019-10-10 DIAGNOSIS — Z131 Encounter for screening for diabetes mellitus: Secondary | ICD-10-CM | POA: Diagnosis not present

## 2019-10-10 DIAGNOSIS — G8929 Other chronic pain: Secondary | ICD-10-CM

## 2019-10-10 LAB — COMPREHENSIVE METABOLIC PANEL
ALT: 84 U/L — ABNORMAL HIGH (ref 0–35)
AST: 48 U/L — ABNORMAL HIGH (ref 0–37)
Albumin: 4.4 g/dL (ref 3.5–5.2)
Alkaline Phosphatase: 64 U/L (ref 39–117)
BUN: 18 mg/dL (ref 6–23)
CO2: 28 mEq/L (ref 19–32)
Calcium: 9.6 mg/dL (ref 8.4–10.5)
Chloride: 102 mEq/L (ref 96–112)
Creatinine, Ser: 0.75 mg/dL (ref 0.40–1.20)
GFR: 81.16 mL/min (ref >60.00)
Glucose, Bld: 85 mg/dL (ref 70–99)
Potassium: 4.1 mEq/L (ref 3.5–5.1)
Sodium: 138 mEq/L (ref 135–145)
Total Bilirubin: 0.4 mg/dL (ref 0.2–1.2)
Total Protein: 7 g/dL (ref 6.0–8.3)

## 2019-10-10 LAB — CBC WITH DIFFERENTIAL/PLATELET
Basophils Absolute: 0 10*3/uL (ref 0.0–0.1)
Basophils Relative: 0.7 % (ref 0.0–3.0)
Eosinophils Absolute: 0.1 10*3/uL (ref 0.0–0.7)
Eosinophils Relative: 2.2 % (ref 0.0–5.0)
HCT: 40.9 % (ref 36.0–46.0)
Hemoglobin: 14 g/dL (ref 12.0–15.0)
Lymphocytes Relative: 31 % (ref 12.0–46.0)
Lymphs Abs: 2.1 10*3/uL (ref 0.7–4.0)
MCHC: 34.1 g/dL (ref 30.0–36.0)
MCV: 92.5 fl (ref 78.0–100.0)
Monocytes Absolute: 0.5 10*3/uL (ref 0.1–1.0)
Monocytes Relative: 7.2 % (ref 3.0–12.0)
Neutro Abs: 4 10*3/uL (ref 1.4–7.7)
Neutrophils Relative %: 58.9 % (ref 43.0–77.0)
Platelets: 268 10*3/uL (ref 150.0–400.0)
RBC: 4.42 Mil/uL (ref 3.87–5.11)
RDW: 13.1 % (ref 11.5–15.5)
WBC: 6.8 10*3/uL (ref 4.0–10.5)

## 2019-10-10 LAB — LIPID PANEL
Cholesterol: 217 mg/dL — ABNORMAL HIGH (ref 0–200)
HDL: 60.7 mg/dL (ref >39.00)
LDL Cholesterol: 118 mg/dL — ABNORMAL HIGH (ref 0–99)
NonHDL: 156.24
Total CHOL/HDL Ratio: 4
Triglycerides: 190 mg/dL — ABNORMAL HIGH (ref 0.0–149.0)
VLDL: 38 mg/dL (ref 0.0–40.0)

## 2019-10-10 LAB — HEMOGLOBIN A1C: Hgb A1c MFr Bld: 5.7 % (ref 4.6–6.5)

## 2019-10-10 LAB — TSH: TSH: 0.83 u[IU]/mL (ref 0.35–4.50)

## 2019-10-10 LAB — VITAMIN D 25 HYDROXY (VIT D DEFICIENCY, FRACTURES): VITD: 60.17 ng/mL (ref 30.00–100.00)

## 2019-10-10 LAB — VITAMIN B12: Vitamin B-12: 309 pg/mL (ref 211–911)

## 2019-10-10 MED ORDER — ALPRAZOLAM 0.5 MG PO TABS: 0.2500 mg | ORAL_TABLET | Freq: Every day | ORAL | 0 refills | Status: DC | PRN

## 2019-10-10 MED ORDER — TRAMADOL HCL 50 MG PO TABS: 50.0000 mg | ORAL_TABLET | Freq: Three times a day (TID) | ORAL | 0 refills | Status: AC | PRN

## 2019-10-10 NOTE — Assessment & Plan Note (Signed)
Had improved with lexapro, not sure why stopped. Maybe because she was doing well.  No clear indication to restart as she is limiting alprazolam use ( 30 tabs in 1 year)... if use increases, we may restart.  No red flags on pdmp.

## 2019-10-10 NOTE — Assessment & Plan Note (Signed)
Modertate control .. if fatigue eval negative.. may restart lexapro to treat mood as potential cause.

## 2019-10-10 NOTE — Progress Notes (Signed)
Chief Complaint  Patient presents with  . Panic attacks    follow up  . Back Pain    refill Tramadol    History of Present Illness: HPI  54 year old female presents for follow up.  1. GAD, MDD: moderate control.. not on SSRI ( was tolerating lexapro low dose per notes). Using alprazolam prn.. she is using this 1/2 -1.5 tabs off and on.. has use 30 in last year. Never saw psychiatry or psychologist last year. She has been working on stress reduction, job change.  PHQ9 SCORE ONLY 10/10/2019 10/18/2018 09/13/2018  Score 9 14 23    GAD 7 : Generalized Anxiety Score 10/10/2019 10/18/2018 10/04/2018 09/13/2018  Nervous, Anxious, on Edge 3 3 3 3   Control/stop worrying 3 2 3 3   Worry too much - different things 3 2 3 3   Trouble relaxing 2 2 3 3   Restless 1 2 3 3   Easily annoyed or irritable 2 2 3 3   Afraid - awful might happen 2 2 3 3   Total GAD 7 Score 16 15 21 21   Anxiety Difficulty Somewhat difficult Very difficult Extremely difficult Very difficult      2. Chronic back pain, lumbar.. radiates to left leg. No new weakness, no new numbness. Was seeing Dr. Nelva Bush. Steroid injections minimally effective. PT helped minimally effective.  Having flare ups  Once a month. Tylenol and heat helps some.  Needs refill of tramadol.. this worked for her in past.  NSAIDs cause GERD.   Pdmp reviewed.. no red flags. Recent ER visit for chest wall pain... used hydrocodone and  Muscle relaxer.  CBD pill seems to be helping more.  3. Request  Wellness labs:  Also just feeling tired, " Off"       This visit occurred during the SARS-CoV-2 public health emergency.  Safety protocols were in place, including screening questions prior to the visit, additional usage of staff PPE, and extensive cleaning of exam room while observing appropriate contact time as indicated for disinfecting solutions.   COVID 19 screen:  No recent travel or known exposure to COVID19 The patient denies respiratory symptoms of  COVID 19 at this time. The importance of social distancing was discussed today.     Review of Systems  Constitutional: Negative for chills and fever.  HENT: Negative for congestion and ear pain.   Eyes: Negative for pain and redness.  Respiratory: Negative for cough and shortness of breath.   Cardiovascular: Positive for palpitations. Negative for chest pain and leg swelling.  Gastrointestinal: Negative for abdominal pain, blood in stool, constipation, diarrhea, nausea and vomiting.  Genitourinary: Negative for dysuria.  Musculoskeletal: Negative for falls and myalgias.  Skin: Negative for rash.  Neurological: Negative for dizziness.  Psychiatric/Behavioral: Negative for depression. The patient is nervous/anxious.       Past Medical History:  Diagnosis Date  . Depression   . GERD (gastroesophageal reflux disease)   . Hyperlipidemia   . Treadmill stress test negative for angina pectoris 12/18/2010    reports that she has been smoking cigarettes. She has a 7.50 pack-year smoking history. She has never used smokeless tobacco. She reports current alcohol use of about 2.0 standard drinks of alcohol per week. She reports that she does not use drugs.   Current Outpatient Medications:  .  ALPRAZolam (XANAX) 0.5 MG tablet, Take 0.5-1 tablets (0.25-0.5 mg total) by mouth daily as needed for anxiety., Disp: 30 tablet, Rfl: 0 .  calcium carbonate (TUMS - DOSED IN MG ELEMENTAL  CALCIUM) 500 MG chewable tablet, Chew 6 tablets by mouth daily as needed for indigestion or heartburn., Disp: , Rfl:  .  estradiol (VIVELLE-DOT) 0.05 MG/24HR patch, PLACE 1 PATCH TRANS-DERMALLY TWICE A WEEK. REMOVE OLD PATCH BEFORE APPLYING NEW PATCH., Disp: 8 patch, Rfl: 11 .  pantoprazole (PROTONIX) 40 MG tablet, Take 1 tablet (40 mg total) by mouth daily., Disp: 90 tablet, Rfl: 3   Observations/Objective: Blood pressure 100/78, pulse 90, temperature 97.9 F (36.6 C), height 5' 5.5" (1.664 m), weight 183 lb 12 oz (83.3  kg), SpO2 96 %.  Physical Exam Constitutional:      General: She is not in acute distress.    Appearance: Normal appearance. She is well-developed. She is obese. She is not ill-appearing or toxic-appearing.  HENT:     Head: Normocephalic.     Right Ear: Hearing, tympanic membrane, ear canal and external ear normal. Tympanic membrane is not erythematous, retracted or bulging.     Left Ear: Hearing, tympanic membrane, ear canal and external ear normal. Tympanic membrane is not erythematous, retracted or bulging.     Nose: No mucosal edema or rhinorrhea.     Right Sinus: No maxillary sinus tenderness or frontal sinus tenderness.     Left Sinus: No maxillary sinus tenderness or frontal sinus tenderness.     Mouth/Throat:     Pharynx: Uvula midline.  Eyes:     General: Lids are normal. Lids are everted, no foreign bodies appreciated.     Conjunctiva/sclera: Conjunctivae normal.     Pupils: Pupils are equal, round, and reactive to light.  Neck:     Thyroid: No thyroid mass or thyromegaly.     Vascular: No carotid bruit.     Trachea: Trachea normal.  Cardiovascular:     Rate and Rhythm: Normal rate and regular rhythm.     Pulses: Normal pulses.     Heart sounds: Normal heart sounds, S1 normal and S2 normal. No murmur. No friction rub. No gallop.   Pulmonary:     Effort: Pulmonary effort is normal. No tachypnea or respiratory distress.     Breath sounds: Normal breath sounds. No decreased breath sounds, wheezing, rhonchi or rales.  Abdominal:     General: Bowel sounds are normal.     Palpations: Abdomen is soft.     Tenderness: There is no abdominal tenderness.  Musculoskeletal:     Cervical back: Normal range of motion and neck supple.  Skin:    General: Skin is warm and dry.     Findings: No rash.  Neurological:     Mental Status: She is alert.  Psychiatric:        Mood and Affect: Mood is not anxious or depressed.        Speech: Speech normal.        Behavior: Behavior normal.  Behavior is cooperative.        Thought Content: Thought content normal.        Judgment: Judgment normal.      Assessment and Plan   Generalized anxiety disorder Had improved with lexapro, not sure why stopped. Maybe because she was doing well.  No clear indication to restart as she is limiting alprazolam use ( 30 tabs in 1 year)... if use increases, we may restart.  No red flags on pdmp.  Current moderate episode of major depressive disorder without prior episode ([place]) Modertate control .. if fatigue eval negative.. may restart lexapro to treat mood as potential cause.  Chronic low  back pain with left-sided sciatica  No red flags per pdmp review.   Continue tramadol prn pain for flares.     Eliezer Lofts, MD

## 2019-10-10 NOTE — Assessment & Plan Note (Signed)
No red flags per pdmp review.   Continue tramadol prn pain for flares.

## 2019-10-11 ENCOUNTER — Encounter: Payer: Self-pay | Admitting: *Deleted

## 2019-10-18 ENCOUNTER — Telehealth: Payer: Self-pay

## 2019-10-18 NOTE — Telephone Encounter (Signed)
Tish notified as instructed by telephone.  Patient states understanding.  

## 2019-10-18 NOTE — Telephone Encounter (Signed)
Pt is concerned about ALT elevated at 84 and AST elevated at 48. I read Dr Diona Browner remarks for the 10/10/19 lab results and pt voiced understanding but she wants to know what she can do naturally to lower liver function test. Pt wants to know what foods she should eat and what else she can do to lower ALT & AST. Pt prefers if can lower test in a natural way. Pt was googling and wants to know what has to be done to detox a liver. Pt request cb after reviewed by Dr Diona Browner; [facility].

## 2019-10-18 NOTE — Telephone Encounter (Signed)
No detox recommended. She should lower fat ( fired foods,fatty foods)in her diet. Increase exercise, avoid alcohol and tylenol.

## 2019-11-29 ENCOUNTER — Encounter: Payer: Self-pay | Admitting: Family Medicine

## 2019-11-29 ENCOUNTER — Telehealth (INDEPENDENT_AMBULATORY_CARE_PROVIDER_SITE_OTHER): Payer: 59 | Admitting: Family Medicine

## 2019-11-29 DIAGNOSIS — Z87891 Personal history of nicotine dependence: Secondary | ICD-10-CM | POA: Diagnosis not present

## 2019-11-29 DIAGNOSIS — J069 Acute upper respiratory infection, unspecified: Secondary | ICD-10-CM | POA: Insufficient documentation

## 2019-11-29 MED ORDER — OSELTAMIVIR PHOSPHATE 75 MG PO CAPS: 75.0000 mg | ORAL_CAPSULE | Freq: Two times a day (BID) | ORAL | 0 refills | Status: DC

## 2019-11-29 MED ORDER — DOXYCYCLINE HYCLATE 100 MG PO TABS: 100.0000 mg | ORAL_TABLET | Freq: Two times a day (BID) | ORAL | 0 refills | Status: DC

## 2019-11-29 NOTE — Assessment & Plan Note (Signed)
Puts pt at higher risk of complex resp infections Pt aware  Enc to consider quitting

## 2019-11-29 NOTE — Assessment & Plan Note (Signed)
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

## 2019-11-29 NOTE — Progress Notes (Signed)
Virtual Visit via Video Note  I connected with [name] on 11/29/19 at  4:00 PM EDT by a video enabled telemedicine application and verified that I am speaking with the correct person using two identifiers.  Location: Patient: home Provider: office   I discussed the limitations of evaluation and management by telemedicine and the availability of in person appointments. The patient expressed understanding and agreed to proceed.  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 Persistent cough 11/15/2018  . Allergic sinusitis 11/11/2018  . Chronic insomnia 10/18/2018  . Symptomatic mammary hypertrophy 09/30/2018  . Current moderate episode of major depressive disorder without prior episode ([place]) 09/06/2018  . Gastritis 01/10/2018  . Palpitations 05/25/2017  . Chronic cervical radiculopathy 07/07/2016  . Lymphadenopathy 02/07/2016  .  Post-traumatic stress 10/05/2014  . Panic attacks 07/25/2013  . Chronic low back pain with left-sided sciatica 05/30/2010  . Generalized anxiety disorder 05/16/2009  . PERS HX TOBACCO USE PRESENTING HAZARDS HEALTH 05/07/2008  . Mixed hyperlipidemia 03/28/2008  . COMMON MIGRAINE 03/28/2008  . GERD 03/28/2008  . COSTOCHONDRITIS, RECURRENT 03/28/2008  . Atypical chest pain 03/28/2008   Past Medical History:  Diagnosis Date  . Depression   . GERD (gastroesophageal reflux disease)   . Hyperlipidemia   . Treadmill stress test negative for angina pectoris 12/18/2010   Past Surgical History:  Procedure Laterality Date  . ABDOMINAL HYSTERECTOMY    . CHOLECYSTECTOMY    . COLONOSCOPY    . PARTIAL HYSTERECTOMY  08-1999   abdominal   Social History   Tobacco Use  . Smoking status: Current Every Day Smoker    Packs/day: 0.25    Years: 30.00    Pack years: 7.50    Types: Cigarettes  . Smokeless tobacco: Never Used  Substance Use Topics  . Alcohol use: Yes    Alcohol/week: 2.0 standard drinks    Types: 1 Glasses of wine, 1 Cans of beer per week    Comment: beer ocassionally  . Drug use: No   Family History  Problem Relation Age of Onset  . COPD Mother   . Heart attack Father 77  . Hypertension Father   . Heart disease Father  stent placement   . Leukemia Brother   . Kidney failure Maternal Grandfather   . COPD Paternal Grandmother   . Congenital heart disease Sister   . Breast cancer Neg Hx    Allergies  Allergen Reactions  . Azithromycin     GI side eff  . Metoclopramide Hcl     REACTION: Nervousness  . Morphine And Related Nausea Only  . Penicillins     REACTION: Upsets stomach  . Venlafaxine Other (See Comments)    Hot flashes, clammy and nausea   Current Outpatient Medications on File Prior to Visit  Medication Sig Dispense Refill  . ALPRAZolam (XANAX) 0.5 MG tablet Take 0.5-1 tablets (0.25-0.5 mg total) by mouth daily as needed for anxiety. 30 tablet 0   . calcium carbonate (TUMS - DOSED IN MG ELEMENTAL CALCIUM) 500 MG chewable tablet Chew 6 tablets by mouth daily as needed for indigestion or heartburn.    . estradiol (VIVELLE-DOT) 0.05 MG/24HR patch PLACE 1 PATCH TRANS-DERMALLY TWICE A WEEK. REMOVE OLD PATCH BEFORE APPLYING NEW PATCH. 8 patch 11  . pantoprazole (PROTONIX) 40 MG tablet Take 1 tablet (40 mg total) by mouth daily. 90 tablet 3   No current facility-administered medications on file prior to visit.   Review of Systems  Constitutional: Positive for fever and malaise/fatigue. Negative for chills.  HENT: Positive for congestion and sinus pain. Negative for ear pain and sore throat.   Eyes: Negative for blurred vision, discharge and redness.  Respiratory: Positive for cough and sputum production. Negative for shortness of breath, wheezing and stridor.   Cardiovascular: Negative for chest pain, palpitations and leg swelling.  Gastrointestinal: Positive for nausea. Negative for abdominal pain, diarrhea and vomiting.  Musculoskeletal: Negative for myalgias.  Skin: Negative for rash.  Neurological: Positive for headaches. Negative for dizziness.    Observations/Objective: Patient appears well, in no distress Weight is baseline  No facial swelling or asymmetry  Pt does have tenderness when pressing on her maxillary sinuses  Normal voice-mildly hoarse and no slurred speech No obvious tremor or mobility impairment Moving neck and UEs normally Able to hear the call well  No cough or shortness of breath during interview  Talkative and mentally sharp with no cognitive changes No skin changes on face or neck , no rash or pallor Affect is normal    Assessment and Plan: Problem List Items Addressed This Visit      Respiratory   Upper respiratory infection    With negative covid test in 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)  Abrupt onset of uri symptoms and fever- influenza is likely  Also sinus  pain/purulent nasal d/c for over a week-suspect early bacterial sinusitis  tamiflu px  Also doxycycline Symptomatic care discussed - also fluids /rest Work note written  Pt has another Chartered loss adjuster as well inst to seek care in ER if suddenly worse Update if no improvement or if new symptoms       Relevant Medications   oseltamivir (TAMIFLU) 75 MG capsule     Other   PERS HX TOBACCO USE PRESENTING HAZARDS HEALTH    Puts pt at higher risk of complex resp infections Pt aware  Enc to consider quitting         Follow Up Instructions: I think you may have influenza given your recent exposure  Take the tamiflu as directed  Also -for bacterial sinusitis take the doxycycline   Drink fluids Continue to treat with over the  counter medicines for fever and other symptoms   Update if not starting to improve in a week or if worsening   Also if any new symptoms If any sudden worsening-go to the ER Also think about quitting smoking    I discussed the assessment and treatment plan with the patient. The patient was provided an opportunity to ask questions and all were answered. The patient agreed with the plan and demonstrated an understanding of the instructions.   The patient was advised to call back or seek an in-person evaluation if the symptoms worsen or if the condition fails to improve as anticipated.     Loura Pardon, MD

## 2019-11-29 NOTE — Patient Instructions (Signed)
I think you may have influenza given your recent exposure  Take the tamiflu as directed  Also -for bacterial sinusitis take the doxycycline   Drink fluids Continue to treat with over the counter medicines for fever and other symptoms   Update if not starting to improve in a week or if worsening   Also if any new symptoms If any sudden worsening-go to the ER Also think about quitting smoking

## 2020-02-26 ENCOUNTER — Other Ambulatory Visit: Payer: Self-pay

## 2020-02-26 ENCOUNTER — Telehealth (INDEPENDENT_AMBULATORY_CARE_PROVIDER_SITE_OTHER): Payer: 59 | Admitting: Family Medicine

## 2020-02-26 VITALS — Temp 97.7°F

## 2020-02-26 DIAGNOSIS — J069 Acute upper respiratory infection, unspecified: Secondary | ICD-10-CM | POA: Diagnosis not present

## 2020-02-26 MED ORDER — DOXYCYCLINE HYCLATE 100 MG PO TABS: 100.0000 mg | ORAL_TABLET | Freq: Two times a day (BID) | ORAL | 0 refills | Status: AC

## 2020-02-26 MED ORDER — HYDROCODONE-HOMATROPINE 5-1.5 MG/5ML PO SYRP: 5.0000 mL | ORAL_SOLUTION | Freq: Three times a day (TID) | ORAL | 0 refills | Status: DC | PRN

## 2020-02-26 NOTE — Progress Notes (Signed)
I connected with [name] on 02/26/20 at  2:00 PM EDT by video and verified that I am speaking with the correct person using two identifiers.   I discussed the limitations, risks, security and privacy concerns of performing an evaluation and management service by video and the availability of in person appointments. I also discussed with the patient that there may be a patient responsible charge related to this service. The patient expressed understanding and agreed to proceed.  Patient location: Home Provider Location: [place] Participants: [name] and Conception M Benko   Subjective:     Rebecca Allen is a 54 y.o. female presenting for Back Pain (sharp pain  x 1 day), Cough (x 2 days ), Ear Pain, and Sore Throat     Back Pain This is a new problem. The current episode started yesterday. The problem occurs intermittently. The pain is present in the thoracic spine. The quality of the pain is described as stabbing. The pain does not radiate. The pain is moderate. The symptoms are aggravated by coughing. Pertinent negatives include no chest pain, fever, headaches, numbness, tingling or weakness. She has tried heat and NSAIDs for the symptoms. The treatment provided no relief.  Cough Associated symptoms include ear pain (started today). Pertinent negatives include no chest pain, fever, headaches or shortness of breath.  Sore Throat  This is a new problem. The current episode started today. The problem has been unchanged. The pain is worse on the right side. There has been no fever. Associated symptoms include congestion, coughing and ear pain (started today). Pertinent negatives include no diarrhea, headaches, shortness of breath, trouble swallowing or vomiting. She has tried NSAIDs for the symptoms.   Coughing has been a few days and has worsened her back pain Has chronic sinus drainage - which is controled on flonase and nasal spray   Cough  is productive of phlegm - dark color  No SOB or wheezing Nothing worse   No known sick contact Stomach has been upset - but gets heartburn - taking Protonix and Tums when that flares   Review of Systems  Constitutional: Negative for fever.  HENT: Positive for congestion and ear pain (started today). Negative for trouble swallowing.   Respiratory: Positive for cough. Negative for shortness of breath.   Cardiovascular: Negative for chest pain.  Gastrointestinal: Negative for diarrhea and vomiting.  Musculoskeletal: Positive for back pain.  Neurological: Negative for tingling, weakness, numbness and headaches.     Social History   Tobacco Use  Smoking Status Current Every Day Smoker  . Packs/day: 0.25  . Years: 30.00  . Pack years: 7.50  . Types: Cigarettes  Smokeless Tobacco Never Used        Objective:   BP Readings from Last 3 Encounters:  10/10/19 100/78  08/11/19 (!) 115/50  05/23/19 110/80   Wt Readings from Last 3 Encounters:  10/10/19 183 lb 12 oz (83.3 kg)  08/11/19 174 lb (78.9 kg)  05/23/19 177 lb 8 oz (80.5 kg)    Temp 97.7 F (36.5 C) (Temporal)    Physical Exam Constitutional:      Appearance: Normal appearance. She is not ill-appearing.  HENT:     Head: Normocephalic and atraumatic.     Right Ear: External ear normal.     Left Ear: External ear normal.  Eyes:     Conjunctiva/sclera: Conjunctivae normal.  Pulmonary:     Effort: Pulmonary effort is normal. No respiratory distress.  Neurological:     Mental Status: She is alert. Mental status is at baseline.  Psychiatric:        Mood and Affect: Mood normal.        Behavior: Behavior normal.        Thought Content: Thought content normal.        Judgment: Judgment normal.            Assessment & Plan:   Problem List Items Addressed This Visit      Respiratory   Upper respiratory infection - Primary   Relevant Medications   HYDROcodone-homatropine (HYCODAN) 5-1.5 MG/5ML syrup    doxycycline (VIBRA-TABS) 100 MG tablet     Advised OTC decongestant and to continue allergy management for 2 days. If not improving could start antibiotic given prior hx of bronchitis and possible ear infection.     Return if symptoms worsen or fail to improve.  [name], MD

## 2020-03-13 NOTE — Progress Notes (Signed)
PCP: Jinny Sanders, MD   Chief Complaint  Patient presents with  . Gynecologic Exam    weight gain and fatigue big concern    HPI:      [name] is a 54 y.o. [phone] whose LMP was No LMP recorded. Patient has had a hysterectomy., presents today for her annual examination.  Her menses are absent due to [facility]. She does have vasomotor sx. Is on estradiol patch 0.05 mg but is trying to wean off. Other providers have told her she needs to come off it, but pt feels better on it.   Sex activity: single partner, contraception - status post hysterectomy. She does have vaginal dryness. Hasn't tried lubricants.   Last Pap: 07/22/18  Results were: unsatisfactory/neg HPV DNA.   Last mammogram: 06/02/18  Results were: Cat 3 LT breast. Pt had LT breast f/u 4/20 which was stable. Repeat bilat mammo due after 6 months.  There is no FH of breast cancer. There is no FH of ovarian cancer. The patient does do self-breast exams.  Colonoscopy: ~9 yrs ago per pt report (I can't find records of it);  Repeat due after 10 years. Pt to check with PCP notes. Had upper endoscopy with Dr. Alice Reichert in 2019.  Tobacco use: smokes about 1/3 to 1/4 ppd, trying to quit. Didn't like nicotine patch. Alcohol use: none  No drug use Exercise: moderately active  She does get adequate calcium and Vitamin D in her diet.  Labs with PCP.   Past Medical History:  Diagnosis Date  . Depression   . GERD (gastroesophageal reflux disease)   . Hyperlipidemia   . Treadmill stress test negative for angina pectoris 12/18/2010    Past Surgical History:  Procedure Laterality Date  . ABDOMINAL HYSTERECTOMY     with 1 ovary removed; remaining over removed a yr later  . CHOLECYSTECTOMY    . COLONOSCOPY      Family History  Problem Relation Age of Onset  . COPD Mother   . Heart attack Father 46  . Hypertension Father   . Heart disease Father        stent placement   . Leukemia Brother   . Kidney failure  Maternal Grandfather   . COPD Paternal Grandmother   . Congenital heart disease Sister   . Breast cancer Neg Hx     Social History   Socioeconomic History  . Marital status: Married    Spouse name: Not on file  . Number of children: Not on file  . Years of education: Not on file  . Highest education level: Not on file  Occupational History  . Occupation: Research scientist (life sciences): [employer]    Comment: Urgent Care in [place]  Tobacco Use  . Smoking status: Current Every Day Smoker    Packs/day: 0.25    Years: 30.00    Pack years: 7.50    Types: Cigarettes  . Smokeless tobacco: Never Used  Vaping Use  . Vaping Use: Never used  Substance and Sexual Activity  . Alcohol use: Yes    Alcohol/week: 2.0 standard drinks    Types: 1 Glasses of wine, 1 Cans of beer per week    Comment: beer ocassionally  . Drug use: No  . Sexual activity: Yes    Birth control/protection: Surgical    Comment: Hsyterectomy  Other Topics Concern  . Not on file  Social History Narrative   Regular exercise- yes, occ walking  Diet- fruits and veggies, water    Social Determinants of Health   Financial Resource Strain:   . Difficulty of Paying Living Expenses:   Food Insecurity:   . Worried About Charity fundraiser in the Last Year:   . Arboriculturist in the Last Year:   Transportation Needs:   . Film/video editor (Medical):   Marland Kitchen Lack of Transportation (Non-Medical):   Physical Activity:   . Days of Exercise per Week:   . Minutes of Exercise per Session:   Stress:   . Feeling of Stress :   Social Connections:   . Frequency of Communication with Friends and Family:   . Frequency of Social Gatherings with Friends and Family:   . Attends Religious Services:   . Active Member of Clubs or Organizations:   . Attends Archivist Meetings:   Marland Kitchen Marital Status:   Intimate Partner Violence:   . Fear of Current or Ex-Partner:   . Emotionally Abused:   Marland Kitchen Physically  Abused:   . Sexually Abused:      Current Outpatient Medications:  .  ALPRAZolam (XANAX) 0.5 MG tablet, Take 0.5-1 tablets (0.25-0.5 mg total) by mouth daily as needed for anxiety., Disp: 30 tablet, Rfl: 0 .  calcium carbonate (TUMS - DOSED IN MG ELEMENTAL CALCIUM) 500 MG chewable tablet, Chew 6 tablets by mouth daily as needed for indigestion or heartburn., Disp: , Rfl:  .  estradiol (VIVELLE-DOT) 0.05 MG/24HR patch, Place 1 patch (0.05 mg total) onto the skin 2 (two) times a week., Disp: 24 patch, Rfl: 3 .  pantoprazole (PROTONIX) 40 MG tablet, Take 1 tablet (40 mg total) by mouth daily., Disp: 90 tablet, Rfl: 3     ROS:  Review of Systems  Constitutional: Negative for fatigue, fever and unexpected weight change.  Respiratory: Positive for shortness of breath and wheezing. Negative for cough.   Cardiovascular: Negative for chest pain, palpitations and leg swelling.  Gastrointestinal: Negative for blood in stool, constipation, diarrhea, nausea and vomiting.  Endocrine: Negative for cold intolerance, heat intolerance and polyuria.  Genitourinary: Negative for dyspareunia, dysuria, flank pain, frequency, genital sores, hematuria, menstrual problem, pelvic pain, urgency, vaginal bleeding, vaginal discharge and vaginal pain.  Musculoskeletal: Positive for arthralgias. Negative for back pain, joint swelling and myalgias.  Skin: Negative for rash.  Neurological: Positive for headaches. Negative for dizziness, syncope, light-headedness and numbness.  Hematological: Negative for adenopathy.  Psychiatric/Behavioral: Positive for agitation. Negative for confusion, sleep disturbance and suicidal ideas. The patient is not nervous/anxious.   BREAST: No symptoms    Objective: BP 110/80   Ht 5' 5.5" (1.664 m)   Wt 183 lb (83 kg)   BMI 29.99 kg/m    Physical Exam Constitutional:      Appearance: She is well-developed.  Genitourinary:     Vulva and vagina normal.     No vaginal  discharge, erythema or tenderness.     Cervix is absent.     Uterus is absent.     No right or left adnexal mass present.     Right adnexa absent.     Right adnexa not tender.     Left adnexa absent.     Left adnexa not tender.     Genitourinary Comments: UTERUS/CX SURG REM  Neck:     Thyroid: No thyromegaly.  Cardiovascular:     Rate and Rhythm: Normal rate and regular rhythm.     Heart sounds: Normal heart sounds. No murmur  heard.   Pulmonary:     Effort: Pulmonary effort is normal.     Breath sounds: Normal breath sounds.  Chest:     Breasts:        Right: No mass, nipple discharge, skin change or tenderness.        Left: No mass, nipple discharge, skin change or tenderness.  Abdominal:     Palpations: Abdomen is soft.     Tenderness: There is no abdominal tenderness. There is no guarding.  Musculoskeletal:        General: Normal range of motion.     Cervical back: Normal range of motion.  Neurological:     General: No focal deficit present.     Mental Status: She is alert and oriented to person, place, and time.     Cranial Nerves: No cranial nerve deficit.  Skin:    General: Skin is warm and dry.  Psychiatric:        Mood and Affect: Mood normal.        Behavior: Behavior normal.        Thought Content: Thought content normal.        Judgment: Judgment normal.  Vitals reviewed.     Assessment/Plan:  Encounter for annual routine gynecological examination  Encounter for screening mammogram for malignant neoplasm of breast - Plan: MM 3D SCREEN BREAST BILATERAL; pt to sched mammo  Hot flashes due to surgical menopause - Plan: estradiol (VIVELLE-DOT) 0.05 MG/24HR patch; Discussed pros/cons. Pt can cont ERT, reassurance. Rx RF eRxd. Will add progesterone if needs further sx control. F/u prn.   H/O bilateral oophorectomy  Screening for colon cancer--pt to f/u with PCP re: when next colonoscopy due.    Meds ordered this encounter  Medications  . estradiol  (VIVELLE-DOT) 0.05 MG/24HR patch    Sig: Place 1 patch (0.05 mg total) onto the skin 2 (two) times a week.    Dispense:  24 patch    Refill:  3    Order Specific Question:   Supervising Provider    Answer:   Gae Dry [[id number]]            GYN counsel breast self exam, mammography screening, use and side effects of HRT, menopause, adequate intake of calcium and vitamin D, diet and exercise    F/U  Return in about 1 year (around 03/14/2021).  Elridge Stemm B. Dhruvi Crenshaw, PA-C 03/14/2020 5:55 PM

## 2020-03-14 ENCOUNTER — Encounter: Payer: Self-pay | Admitting: Obstetrics and Gynecology

## 2020-03-14 ENCOUNTER — Ambulatory Visit (INDEPENDENT_AMBULATORY_CARE_PROVIDER_SITE_OTHER): Payer: 59 | Admitting: Obstetrics and Gynecology

## 2020-03-14 ENCOUNTER — Other Ambulatory Visit: Payer: Self-pay

## 2020-03-14 VITALS — BP 110/80 | Ht 65.5 in | Wt 183.0 lb

## 2020-03-14 DIAGNOSIS — Z90722 Acquired absence of ovaries, bilateral: Secondary | ICD-10-CM | POA: Diagnosis not present

## 2020-03-14 DIAGNOSIS — E8941 Symptomatic postprocedural ovarian failure: Secondary | ICD-10-CM

## 2020-03-14 DIAGNOSIS — Z1231 Encounter for screening mammogram for malignant neoplasm of breast: Secondary | ICD-10-CM

## 2020-03-14 DIAGNOSIS — Z01419 Encounter for gynecological examination (general) (routine) without abnormal findings: Secondary | ICD-10-CM

## 2020-03-14 DIAGNOSIS — Z1211 Encounter for screening for malignant neoplasm of colon: Secondary | ICD-10-CM

## 2020-03-14 MED ORDER — ESTRADIOL 0.05 MG/24HR TD PTTW: 1.0000 | MEDICATED_PATCH | TRANSDERMAL | 3 refills | Status: DC

## 2020-03-14 NOTE — Patient Instructions (Addendum)
I value your feedback and entrusting us with your care. If you get a [facility] patient survey, I would appreciate you taking the time to let us know about your experience today. Thank you! ° °As of July 20, 2019, your lab results will be released to your MyChart immediately, before I even have a chance to see them. Please give me time to review them and contact you if there are any abnormalities. Thank you for your patience.  ° °[facility]: [phone] ° ° ° °

## 2020-03-17 ENCOUNTER — Emergency Department
Admission: EM | Admit: 2020-03-17 | Discharge: 2020-03-17 | Disposition: A | Discharge status: Home / Self Care | Payer: 59 | Attending: Emergency Medicine | Admitting: Emergency Medicine

## 2020-03-17 ENCOUNTER — Emergency Department: Payer: 59

## 2020-03-17 ENCOUNTER — Other Ambulatory Visit: Payer: Self-pay

## 2020-03-17 DIAGNOSIS — R079 Chest pain, unspecified: Secondary | ICD-10-CM | POA: Diagnosis not present

## 2020-03-17 DIAGNOSIS — Z5321 Procedure and treatment not carried out due to patient leaving prior to being seen by health care provider: Secondary | ICD-10-CM | POA: Insufficient documentation

## 2020-03-17 LAB — BASIC METABOLIC PANEL
Anion gap: 10 (ref 5–15)
BUN: 16 mg/dL (ref 6–20)
CO2: 24 mmol/L (ref 22–32)
Calcium: 9.1 mg/dL (ref 8.9–10.3)
Chloride: 106 mmol/L (ref 98–111)
Creatinine, Ser: 0.84 mg/dL (ref 0.44–1.00)
GFR calc Af Amer: >60 mL/min (ref >60)
GFR calc non Af Amer: >60 mL/min (ref >60)
Glucose, Bld: 106 mg/dL — ABNORMAL HIGH (ref 70–99)
Potassium: 4 mmol/L (ref 3.5–5.1)
Sodium: 140 mmol/L (ref 135–145)

## 2020-03-17 LAB — CBC
HCT: 39.3 % (ref 36.0–46.0)
Hemoglobin: 13 g/dL (ref 12.0–15.0)
MCH: 30.7 pg (ref 26.0–34.0)
MCHC: 33.1 g/dL (ref 30.0–36.0)
MCV: 92.9 fL (ref 80.0–100.0)
Platelets: 271 10*3/uL (ref 150–400)
RBC: 4.23 MIL/uL (ref 3.87–5.11)
RDW: 13.4 % (ref 11.5–15.5)
WBC: 6.4 10*3/uL (ref 4.0–10.5)
nRBC: 0 % (ref 0.0–0.2)

## 2020-03-17 LAB — TROPONIN I (HIGH SENSITIVITY): Troponin I (High Sensitivity): 4 ng/L (ref <18)

## 2020-03-17 NOTE — ED Triage Notes (Signed)
Pt states she started having chest pain last night but then it went away- pt states today it is now very sharp and she feels like her heart is "fluttering"- pt states the pain shoots to the back

## 2020-03-19 ENCOUNTER — Telehealth: Payer: Self-pay | Admitting: Emergency Medicine

## 2020-03-19 NOTE — Telephone Encounter (Signed)
Called patient due to lwot to inquire about condition and follow up plans. Says she is feeling congested now and she plans to call her doctor today.  She is aware labs and xray are available for her doctor to review.

## 2020-04-02 ENCOUNTER — Ambulatory Visit: Payer: 59 | Admitting: Family Medicine

## 2020-04-02 ENCOUNTER — Other Ambulatory Visit: Payer: Self-pay

## 2020-04-02 VITALS — BP 110/70 | HR 85 | Temp 98.0°F | Ht 65.5 in | Wt 185.5 lb

## 2020-04-02 DIAGNOSIS — F410 Panic disorder [episodic paroxysmal anxiety] without agoraphobia: Secondary | ICD-10-CM

## 2020-04-02 DIAGNOSIS — F411 Generalized anxiety disorder: Secondary | ICD-10-CM | POA: Diagnosis not present

## 2020-04-02 DIAGNOSIS — T148XXA Other injury of unspecified body region, initial encounter: Secondary | ICD-10-CM | POA: Diagnosis not present

## 2020-04-02 DIAGNOSIS — R21 Rash and other nonspecific skin eruption: Secondary | ICD-10-CM

## 2020-04-02 MED ORDER — ESCITALOPRAM OXALATE 5 MG PO TABS
2.5000 mg | ORAL_TABLET | Freq: Every day | ORAL | 5 refills | Status: DC
Start: 2020-04-02 — End: 2020-07-11

## 2020-04-02 MED ORDER — TRIAMCINOLONE ACETONIDE 0.5 % EX CREA
1.0000 | TOPICAL_CREAM | Freq: Two times a day (BID) | CUTANEOUS | 0 refills | Status: DC
Start: 2020-04-02 — End: 2020-06-18

## 2020-04-02 MED ORDER — ALPRAZOLAM 0.5 MG PO TABS: 0.2500 mg | ORAL_TABLET | Freq: Every day | ORAL | 0 refills | Status: DC | PRN

## 2020-04-02 NOTE — Patient Instructions (Addendum)
Start Lexapro low dose for mood.  Can use alprazolam as needed.  Apply topical steroid to rash on left leg.  Elevated right leg for  hematoma and  apply neosporin to scab, cover. Will take time to heal.

## 2020-04-02 NOTE — Progress Notes (Signed)
Chief Complaint  Patient presents with  . Medication Refill    xanax  . Blister    raised area on left leg x 1 week   . Abrasion    right shin x 2 weeks     History of Present Illness: HPI  54 year old female presents for  Several issues.   GAD/panic attacks: using alprazolam .. now once a week days  for anxiety 30 tab since 10/2019  She has been more stressed out, poor sleep, jumpy, worried. Decreased motivation, fatigued. PHQ9: 17 GAD7:  63  Was previously on lexapro not sure why stopped. PDMP reviewed during this encounter.   Wt Readings from Last 3 Encounters:  04/02/20 185 lb 8 oz (84.1 kg)  03/14/20 183 lb (83 kg)  10/10/19 183 lb 12 oz (83.3 kg)    Left lower leg.. itchy raised lesion. X 1 week.  No known contact to plants. Applying neosporin and peroxide.  Not spreading.     This visit occurred during the SARS-CoV-2 public health emergency.  Safety protocols were in place, including screening questions prior to the visit, additional usage of staff PPE, and extensive cleaning of exam room while observing appropriate contact time as indicated for disinfecting solutions.   COVID 19 screen:  No recent travel or known exposure to COVID19 The patient denies respiratory symptoms of COVID 19 at this time. The importance of social distancing was discussed today.     Review of Systems  Constitutional: Negative for chills and fever.  HENT: Negative for congestion and ear pain.   Eyes: Negative for pain and redness.  Respiratory: Negative for cough and shortness of breath.   Cardiovascular: Negative for chest pain, palpitations and leg swelling.  Gastrointestinal: Negative for abdominal pain, blood in stool, constipation, diarrhea, nausea and vomiting.  Genitourinary: Negative for dysuria.  Musculoskeletal: Negative for falls and myalgias.  Skin: Negative for rash.  Neurological: Negative for dizziness.  Psychiatric/Behavioral: Negative for depression. The patient is  not nervous/anxious.       Past Medical History:  Diagnosis Date  . Depression   . GERD (gastroesophageal reflux disease)   . Hyperlipidemia   . Treadmill stress test negative for angina pectoris 12/18/2010    reports that she has been smoking cigarettes. She has a 7.50 pack-year smoking history. She has never used smokeless tobacco. She reports current alcohol use of about 2.0 standard drinks of alcohol per week. She reports that she does not use drugs.   Current Outpatient Medications:  .  ALPRAZolam (XANAX) 0.5 MG tablet, Take 0.5-1 tablets (0.25-0.5 mg total) by mouth daily as needed for anxiety., Disp: 30 tablet, Rfl: 0 .  calcium carbonate (TUMS - DOSED IN MG ELEMENTAL CALCIUM) 500 MG chewable tablet, Chew 6 tablets by mouth daily as needed for indigestion or heartburn., Disp: , Rfl:  .  estradiol (VIVELLE-DOT) 0.05 MG/24HR patch, Place 1 patch (0.05 mg total) onto the skin 2 (two) times a week., Disp: 24 patch, Rfl: 3 .  pantoprazole (PROTONIX) 40 MG tablet, Take 1 tablet (40 mg total) by mouth daily., Disp: 90 tablet, Rfl: 3   Observations/Objective: Blood pressure 110/70, pulse 85, temperature 98 F (36.7 C), temperature source Temporal, height 5' 5.5" (1.664 m), weight 185 lb 8 oz (84.1 kg), SpO2 97 %.  Physical Exam Constitutional:      General: She is not in acute distress.    Appearance: Normal appearance. She is well-developed. She is not ill-appearing or toxic-appearing.  HENT:  Head: Normocephalic.     Right Ear: Hearing, tympanic membrane, ear canal and external ear normal. Tympanic membrane is not erythematous, retracted or bulging.     Left Ear: Hearing, tympanic membrane, ear canal and external ear normal. Tympanic membrane is not erythematous, retracted or bulging.     Nose: No mucosal edema or rhinorrhea.     Right Sinus: No maxillary sinus tenderness or frontal sinus tenderness.     Left Sinus: No maxillary sinus tenderness or frontal sinus tenderness.      Mouth/Throat:     Pharynx: Uvula midline.  Eyes:     General: Lids are normal. Lids are everted, no foreign bodies appreciated.     Conjunctiva/sclera: Conjunctivae normal.     Pupils: Pupils are equal, round, and reactive to light.  Neck:     Thyroid: No thyroid mass or thyromegaly.     Vascular: No carotid bruit.     Trachea: Trachea normal.  Cardiovascular:     Rate and Rhythm: Normal rate and regular rhythm.     Pulses: Normal pulses.     Heart sounds: Normal heart sounds, S1 normal and S2 normal. No murmur heard.  No friction rub. No gallop.   Pulmonary:     Effort: Pulmonary effort is normal. No tachypnea or respiratory distress.     Breath sounds: Normal breath sounds. No decreased breath sounds, wheezing, rhonchi or rales.  Abdominal:     General: Bowel sounds are normal.     Palpations: Abdomen is soft.     Tenderness: There is no abdominal tenderness.  Musculoskeletal:     Cervical back: Normal range of motion and neck supple.  Skin:    General: Skin is warm and dry.     Findings: Rash present. Rash is scaling.  Neurological:     Mental Status: She is alert.  Psychiatric:        Mood and Affect: Mood is not anxious or depressed.        Speech: Speech normal.        Behavior: Behavior normal. Behavior is cooperative.        Thought Content: Thought content normal.        Judgment: Judgment normal.      Assessment and Plan Generalized anxiety disorder Start Lexapro low dose for mood.  Can use alprazolam as needed.   Rash Apply topical steroid to rash on left leg.       Eliezer Lofts, MD

## 2020-04-10 ENCOUNTER — Encounter: Payer: Self-pay | Admitting: Family Medicine

## 2020-04-10 ENCOUNTER — Telehealth (INDEPENDENT_AMBULATORY_CARE_PROVIDER_SITE_OTHER): Payer: 59 | Admitting: Family Medicine

## 2020-04-10 VITALS — Temp 99.8°F | Ht 65.5 in | Wt 183.0 lb

## 2020-04-10 DIAGNOSIS — J22 Unspecified acute lower respiratory infection: Secondary | ICD-10-CM

## 2020-04-10 DIAGNOSIS — Z87891 Personal history of nicotine dependence: Secondary | ICD-10-CM | POA: Diagnosis not present

## 2020-04-10 DIAGNOSIS — Z289 Immunization not carried out for unspecified reason: Secondary | ICD-10-CM

## 2020-04-10 MED ORDER — AZITHROMYCIN 250 MG PO TABS: ORAL_TABLET | ORAL | 0 refills | Status: DC

## 2020-04-10 NOTE — Progress Notes (Signed)
Virtual Visit via Video Note  I connected with Rebecca Allen on 04/10/20 at 11:15 AM EDT by a video enabled telemedicine application and verified that I am speaking with the correct person using two identifiers.  Location: Patient: outside of a building Provider: [place] Persons participating in virtual visit: Patient, provider   I discussed the limitations of evaluation and management by telemedicine and the availability of in person appointments. The patient expressed understanding and agreed to proceed.  History of Present Illness: Chief Complaint  Patient presents with  . Cough    x2 days  . Nasal Congestion  This is a 53 year old female who presents for virtual visit today for above chief complaint.  She has history of allergic rhinitis and has been having some increased maxillary sinus pressure and congestion off and on.  She takes daily Claritin and nasal decongestant.  She denies headaches, fever, loss of taste or smell, wheeze, shortness of breath.  She smokes daily.  She is not vaccinated against COVID-19.  She denies any sick contacts.  She reports that she gets Covid tested twice a week for work and had a test yesterday, results are pending. She was treated for lower respiratory tract infection about 6 weeks ago with doxycycline.  She is requesting azithromycin today as she is leaving to go out of town for 12 days at the end of the week.    Observations/Objective: Patient is alert and answers questions appropriately.  Visible skin is unremarkable.  Respirations are even and unlabored without increased work of breathing with patient ambulating.  No audible wheeze or witnessed cough.  Mood and affect are appropriate. Temp 99.8 F (37.7 C) (Oral)   Ht 5' 5.5" (1.664 m)   Wt 183 lb (83 kg)   BMI 29.99 kg/m    Assessment and Plan: 1. Lower resp. tract infection -I provided her a wait-and-see antibiotic and told her to wait 3 to 5 days prior to starting.   This is very likely a viral infection.  Discussed importance of ruling out COVID-19.  She has a test that was obtained yesterday pending. -Continue symptomatic treatment, good fluid intake - azithromycin (ZITHROMAX) 250 MG tablet; Take 2 tabs PO x 1 dose, then 1 tab PO QD x 4 days  Dispense: 6 tablet; Refill: 0  2. PERS HX TOBACCO USE PRESENTING HAZARDS HEALTH -Encouraged patient to consider reducing smoking and cessation  3. COVID-19 virus vaccination not done -Patient opposed to getting vaccine, encouraged her to reconsider   Clarene Reamer, FNP-BC  [facility]  04/10/2020 11:45 AM   Follow Up Instructions:    I discussed the assessment and treatment plan with the patient. The patient was provided an opportunity to ask questions and all were answered. The patient agreed with the plan and demonstrated an understanding of the instructions.   The patient was advised to call back or seek an in-person evaluation if the symptoms worsen or if the condition fails to improve as anticipated.   Elby Beck, FNP

## 2020-04-11 ENCOUNTER — Telehealth: Payer: Self-pay | Admitting: Family Medicine

## 2020-04-11 NOTE — Telephone Encounter (Signed)
Patient called in stating she had visit yesterday and was tested for covid as well. Test came back positive and patient in inquiring if she can have an infusion as well as see what else provider would recommend. Please advise.

## 2020-04-12 ENCOUNTER — Telehealth: Payer: Self-pay | Admitting: Physician Assistant

## 2020-04-12 ENCOUNTER — Other Ambulatory Visit: Payer: Self-pay | Admitting: Physician Assistant

## 2020-04-12 DIAGNOSIS — E663 Overweight: Secondary | ICD-10-CM

## 2020-04-12 DIAGNOSIS — U071 COVID-19: Secondary | ICD-10-CM

## 2020-04-12 NOTE — Telephone Encounter (Signed)
Spoke to pt and let her know about referral and she had actually already heard from them and goes in tomorrow for infusion.

## 2020-04-12 NOTE — Telephone Encounter (Signed)
Please triage

## 2020-04-12 NOTE — Progress Notes (Signed)
I connected by phone with Rebecca Allen on 04/12/2020 at 10:52 AM to discuss the potential use of a new treatment for mild to moderate COVID-19 viral infection in non-hospitalized patients.  This patient is a 54 y.o. female that meets the FDA criteria for Emergency Use Authorization of COVID monoclonal antibody casirivimab/imdevimab.  Has a (+) direct SARS-CoV-2 viral test result  Has mild or moderate COVID-19   Is NOT hospitalized due to COVID-19  Is within 10 days of symptom onset  Has at least one of the high risk factor(s) for progression to severe COVID-19 and/or hospitalization as defined in EUA.  Specific high risk criteria : BMI > 25   I have spoken and communicated the following to the patient or parent/caregiver regarding COVID monoclonal antibody treatment:  1. FDA has authorized the emergency use for the treatment of mild to moderate COVID-19 in adults and pediatric patients with positive results of direct SARS-CoV-2 viral testing who are 45 years of age and older weighing at least 40 kg, and who are at high risk for progressing to severe COVID-19 and/or hospitalization.  2. The significant known and potential risks and benefits of COVID monoclonal antibody, and the extent to which such potential risks and benefits are unknown.  3. Information on available alternative treatments and the risks and benefits of those alternatives, including clinical trials.  4. Patients treated with COVID monoclonal antibody should continue to self-isolate and use infection control measures (e.g., wear mask, isolate, social distance, avoid sharing personal items, clean and disinfect "high touch" surfaces, and frequent handwashing) according to CDC guidelines.   5. The patient or parent/caregiver has the option to accept or refuse COVID monoclonal antibody treatment.  After reviewing this information with the patient, The patient agreed to proceed with receiving casirivimab\imdevimab  infusion and will be provided a copy of the Fact sheet prior to receiving the infusion.    [name] 04/12/2020 10:52 AM

## 2020-04-12 NOTE — Telephone Encounter (Signed)
Called to Discuss with patient about Covid symptoms and the use of the monoclonal antibody infusion for those with mild to moderate Covid symptoms and at a high risk of hospitalization.     Pt appears to qualify for this infusion due to co-morbid conditions and/or a member of an at-risk group in accordance with the FDA Emergency Use Authorization.    Unable to reach pt. Left voice mail to call back.   

## 2020-04-12 NOTE — Telephone Encounter (Signed)
I spoke with pt; pts temp is 100.7 and having chills,body aches all over body, H/A on and off and both eye sockets hurt. Prod cough but pt does not cough up phlegm.pt feels like has a lot of chest congestion. SOB is not bad but intermittent upon exertion. Fatigue to the point pt does not want to get out of bed. Pt having dry mouth but no problem with urination and urine is light yellow. Pt does not have any other covid symptoms.  Pt will drink plenty of liquids, rest, take tylenol for fever and self quarantine. Pt is very interested in monoclonal antibody infusion. Pt request cb. UC & ED precautions given and pt voiced understanding.

## 2020-04-12 NOTE — Telephone Encounter (Signed)
OK.. her BMI is 29 so overweight status will qualify her for MAB as long as she is within 10 days of onset. I will sen the referral for her and they should be calling her soon.  CC: Butch Penny and Mearl Latin as well.

## 2020-04-13 ENCOUNTER — Ambulatory Visit (HOSPITAL_COMMUNITY)
Admission: RE | Admit: 2020-04-13 | Discharge: 2020-04-13 | Disposition: A | Discharge status: Home / Self Care | Payer: 59 | Source: Ambulatory Visit | Attending: Pulmonary Disease | Admitting: Pulmonary Disease

## 2020-04-13 DIAGNOSIS — U071 COVID-19: Secondary | ICD-10-CM | POA: Insufficient documentation

## 2020-04-13 MED ORDER — EPINEPHRINE 0.3 MG/0.3ML IJ SOAJ: 0.3000 mg | Freq: Once | INTRAMUSCULAR | Status: DC | PRN

## 2020-04-13 MED ORDER — DIPHENHYDRAMINE HCL 50 MG/ML IJ SOLN: 50.0000 mg | Freq: Once | INTRAMUSCULAR | Status: DC | PRN

## 2020-04-13 MED ORDER — METHYLPREDNISOLONE SODIUM SUCC 125 MG IJ SOLR: 125.0000 mg | Freq: Once | INTRAMUSCULAR | Status: DC | PRN

## 2020-04-13 MED ORDER — FAMOTIDINE IN NACL 20-0.9 MG/50ML-% IV SOLN: 20.0000 mg | Freq: Once | INTRAVENOUS | Status: DC | PRN

## 2020-04-13 MED ORDER — SODIUM CHLORIDE 0.9 % IV SOLN
1200.0000 mg | Freq: Once | INTRAVENOUS | Status: AC
  Administered 2020-04-13: 1200 mg via INTRAVENOUS
  Filled 2020-04-13: qty 10

## 2020-04-13 MED ORDER — ALBUTEROL SULFATE HFA 108 (90 BASE) MCG/ACT IN AERS: 2.0000 | INHALATION_SPRAY | Freq: Once | RESPIRATORY_TRACT | Status: DC | PRN

## 2020-04-13 MED ORDER — SODIUM CHLORIDE 0.9 % IV SOLN: INTRAVENOUS | Status: DC | PRN

## 2020-04-13 NOTE — Discharge Instructions (Signed)

## 2020-04-13 NOTE — Progress Notes (Signed)
  Diagnosis: COVID-19  Physician: Joya Gaskins MD  Procedure: Covid Infusion Clinic Med: casirivimab\imdevimab infusion - Provided patient with casirivimab\imdevimab fact sheet for patients, parents and caregivers prior to infusion.  Complications: No immediate complications noted.  Discharge: Discharged home   Rebecca Allen 04/13/2020

## 2020-04-17 ENCOUNTER — Ambulatory Visit (INDEPENDENT_AMBULATORY_CARE_PROVIDER_SITE_OTHER): Payer: 59

## 2020-04-17 ENCOUNTER — Ambulatory Visit
Admission: EM | Admit: 2020-04-17 | Discharge: 2020-04-17 | Disposition: A | Discharge status: Home / Self Care | Payer: 59 | Attending: Family Medicine | Admitting: Family Medicine

## 2020-04-17 ENCOUNTER — Other Ambulatory Visit: Payer: Self-pay

## 2020-04-17 DIAGNOSIS — R0602 Shortness of breath: Secondary | ICD-10-CM | POA: Diagnosis not present

## 2020-04-17 DIAGNOSIS — U071 COVID-19: Secondary | ICD-10-CM | POA: Diagnosis not present

## 2020-04-17 MED ORDER — ALBUTEROL SULFATE HFA 108 (90 BASE) MCG/ACT IN AERS: 1.0000 | INHALATION_SPRAY | Freq: Four times a day (QID) | RESPIRATORY_TRACT | 0 refills | Status: DC | PRN

## 2020-04-17 MED ORDER — BENZONATATE 200 MG PO CAPS: 200.0000 mg | ORAL_CAPSULE | Freq: Three times a day (TID) | ORAL | 0 refills | Status: DC | PRN

## 2020-04-17 NOTE — Telephone Encounter (Signed)
Patient called in stating she is still not feeling well, even after infusion. Patient called infusion center and they advised her to call PCP and request a steroid. Please advise. Patient is wanting this asap.

## 2020-04-17 NOTE — ED Triage Notes (Signed)
Patient in today for a chest x-ray. Patient tested positive for COVID 6 days ago. Patient states she had her infusion on Saturday.   Still has complaint of cough, SOB, and slight chest pain, and weakness.

## 2020-04-17 NOTE — Telephone Encounter (Signed)
I don't know what "not feeling well' means.  Please triage to make sure she does not need to be see ASAP in person.

## 2020-04-17 NOTE — Discharge Instructions (Signed)
Rest.  Medication as prescribed.  Xray negative.  Take care  Dr. Lacinda Axon

## 2020-04-17 NOTE — ED Provider Notes (Signed)
[facility]    CSN: [id number] Arrival date & time: 04/17/20  1252      History   Chief Complaint Chief Complaint  Patient presents with   Cough   Chest Pain    slight   HPI   54 year old female who is Covid positive presents for evaluation of cough and shortness of breath.  Patient has been symptomatic since Tuesday of last week.  She was tested on Wednesday and her test result returned positive on Thursday.  Patient recently had a monoclonal antibody infusion on Saturday.  Patient states that she is having productive cough and shortness of breath.  She feels weak and fatigued.  She is concerned given her symptoms.  She is had no fever for 3 days.  She endorses difficulty sleeping secondary to her symptoms.  No relieving factors.  No other complaints.   Past Medical History:  Diagnosis Date   Depression    GERD (gastroesophageal reflux disease)    Hyperlipidemia    Treadmill stress test negative for angina pectoris 12/18/2010   Patient Active Problem List   Diagnosis Date Noted   Upper respiratory infection 11/29/2019   Persistent cough 11/15/2018   Allergic sinusitis 11/11/2018   Chronic insomnia 10/18/2018   Symptomatic mammary hypertrophy 09/30/2018   Current moderate episode of major depressive disorder without prior episode ([place]) 09/06/2018   Gastritis 01/10/2018   Palpitations 05/25/2017   Chronic cervical radiculopathy 07/07/2016   Lymphadenopathy 02/07/2016   Post-traumatic stress 10/05/2014   Panic attacks 07/25/2013   Chronic low back pain with left-sided sciatica 05/30/2010   Generalized anxiety disorder 05/16/2009   PERS HX TOBACCO USE PRESENTING HAZARDS HEALTH 05/07/2008   Mixed hyperlipidemia 03/28/2008   COMMON MIGRAINE 03/28/2008   GERD 03/28/2008   COSTOCHONDRITIS, RECURRENT 03/28/2008   Atypical chest pain 03/28/2008    Past Surgical History:  Procedure Laterality Date   ABDOMINAL HYSTERECTOMY     with 1  ovary removed; remaining over removed a yr later   CHOLECYSTECTOMY     COLONOSCOPY      OB History    Gravida  4   Para  3   Term  3   Preterm      AB  1   Living  3     SAB      TAB      Ectopic      Multiple      Live Births  3            Home Medications    Prior to Admission medications   Medication Sig Start Date End Date Taking? Authorizing Provider  ALPRAZolam Duanne Moron) 0.5 MG tablet Take 0.5-1 tablets (0.25-0.5 mg total) by mouth daily as needed for anxiety. 04/02/20  Yes Bedsole, Amy E, MD  calcium carbonate (TUMS - DOSED IN MG ELEMENTAL CALCIUM) 500 MG chewable tablet Chew 6 tablets by mouth daily as needed for indigestion or heartburn.   Yes [provider]  escitalopram (LEXAPRO) 5 MG tablet Take 0.5 tablets (2.5 mg total) by mouth daily. 04/02/20  Yes Bedsole, Amy E, MD  estradiol (VIVELLE-DOT) 0.05 MG/24HR patch Place 1 patch (0.05 mg total) onto the skin 2 (two) times a week. 09/14/34  Yes Copland, Elmo Putt B, PA-C  pantoprazole (PROTONIX) 40 MG tablet Take 1 tablet (40 mg total) by mouth daily. 05/23/19  Yes Bedsole, Amy E, MD  triamcinolone cream (KENALOG) 0.5 % Apply 1 application topically 2 (two) times daily. 04/02/20  Yes Bedsole, Amy E,  MD  albuterol (VENTOLIN HFA) 108 (90 Base) MCG/ACT inhaler Inhale 1-2 puffs into the lungs every 6 (six) hours as needed for wheezing or shortness of breath. 04/17/20   Coral Spikes, DO  benzonatate (TESSALON) 200 MG capsule Take 1 capsule (200 mg total) by mouth 3 (three) times daily as needed for cough. 04/17/20   Coral Spikes, DO    Family History Family History  Problem Relation Age of Onset   COPD Mother    Heart attack Father 41   Hypertension Father    Heart disease Father        stent placement    Leukemia Brother    Kidney failure Maternal Grandfather    COPD Paternal Grandmother    Congenital heart disease Sister    Breast cancer Neg Hx     Social History Social History    Tobacco Use   Smoking status: Current Every Day Smoker    Packs/day: 0.25    Years: 30.00    Pack years: 7.50    Types: Cigarettes   Smokeless tobacco: Never Used  Scientific laboratory technician Use: Never used  Substance Use Topics   Alcohol use: Yes    Alcohol/week: 2.0 standard drinks    Types: 1 Glasses of wine, 1 Cans of beer per week    Comment: beer ocassionally   Drug use: No     Allergies   Azithromycin, Metoclopramide hcl, Morphine and related, Penicillins, and Venlafaxine   Review of Systems Review of Systems  Constitutional: Positive for fatigue. Negative for fever.  Respiratory: Positive for cough, chest tightness and shortness of breath.    Physical Exam Triage Vital Signs ED Triage Vitals  Enc Vitals Group     BP 04/17/20 1352 112/74     Pulse Rate 04/17/20 1352 65     Resp 04/17/20 1352 14     Temp 04/17/20 1352 98 F (36.7 C)     Temp Source 04/17/20 1352 Oral     SpO2 04/17/20 1352 99 %     Weight 04/17/20 1354 178 lb (80.7 kg)     Height 04/17/20 1354 5' 5.5" (1.664 m)     Head Circumference --      Peak Flow --      Pain Score 04/17/20 1353 0     Pain Loc --      Pain Edu? --      Excl. in [place]? --    Updated Vital Signs BP 112/74 (BP Location: Left Arm)    Pulse 65    Temp 98 F (36.7 C) (Oral)    Resp 14    Ht 5' 5.5" (1.664 m)    Wt 80.7 kg    SpO2 99%    BMI 29.17 kg/m   Visual Acuity Right Eye Distance:   Left Eye Distance:   Bilateral Distance:    Right Eye Near:   Left Eye Near:    Bilateral Near:     Physical Exam   UC Treatments / Results  Labs (all labs ordered are listed, but only abnormal results are displayed) Labs Reviewed - No data to display  EKG   Radiology DG Chest 2 View  Result Date: 04/17/2020 CLINICAL DATA:  Shortness of breath.  COVID-19 positive. EXAM: CHEST - 2 VIEW COMPARISON:  March 17, 2020. FINDINGS: The heart size and mediastinal contours are within normal limits. Both lungs are clear. No  pneumothorax or pleural effusion is noted. The visualized skeletal structures are  unremarkable. IMPRESSION: No active cardiopulmonary disease. Electronically Signed   By: Marijo Conception M.D.   On: 04/17/2020 14:24    Procedures Procedures (including critical care time)  Medications Ordered in UC Medications - No data to display  Initial Impression / Assessment and Plan / UC Course  I have reviewed the triage vital signs and the nursing notes.  Pertinent labs & imaging results that were available during my care of the patient were reviewed by me and considered in my medical decision making (see chart for details).    54 year old female presents with cough, chest tightness, and shortness of breath in the setting of COVID-19.  X-ray obtained today and independently reviewed by me.  Interpretation: No acute findings.  No evidence of pneumonia.  Albuterol as needed.  Tessalon Perles for cough.  Supportive care.  Final Clinical Impressions(s) / UC Diagnoses   Final diagnoses:  [id number]     Discharge Instructions     Rest.  Medication as prescribed.  Xray negative.  Take care  Dr. Lacinda Axon    ED Prescriptions    Medication Sig Dispense Auth. Provider   albuterol (VENTOLIN HFA) 108 (90 Base) MCG/ACT inhaler Inhale 1-2 puffs into the lungs every 6 (six) hours as needed for wheezing or shortness of breath. 18 g Kennethia Lynes G, DO   benzonatate (TESSALON) 200 MG capsule Take 1 capsule (200 mg total) by mouth 3 (three) times daily as needed for cough. 30 capsule Coral Spikes, DO     PDMP not reviewed this encounter.   Coral Spikes, [place] 04/17/20 1526

## 2020-04-17 NOTE — Telephone Encounter (Signed)
Spoke to patient by telephone and was advised that she tested positive for covid Thursday and had an infusion Saturday. Patient stated  that she is coughing up a lot of dark mucus. Patient stated that she is not running a fever.Patient stated that she is having a hard time breathing because her nose is so stopped up, but denies SOB. . Patient  stated that she can not taste or smell anything. Patient stated that she finished the Z-pak yesterday or day before. Patient stated that she is weak and has a lot of head congestion. Patient stated that she thinks that she may need a steroid. Patient was advised that it sounds like she needs to have a face to face visit to be evaluated. Patient was given information on the UC in [place]. Patient stated that she will plan on going to the UC in [place]. Patient was advised if she needs a chest x-ray they have the equipment there to do one.

## 2020-04-18 ENCOUNTER — Telehealth: Payer: Self-pay | Admitting: Family Medicine

## 2020-04-18 MED ORDER — PREDNISONE 20 MG PO TABS: ORAL_TABLET | ORAL | 0 refills | Status: DC

## 2020-04-18 NOTE — Telephone Encounter (Signed)
Tish notified as instructed by telephone.  Tish states understanding.

## 2020-04-18 NOTE — Telephone Encounter (Signed)
We can try prednisone taper. Have her call if not improving as expected by early next week. Go to ER if severe shortness of breath.

## 2020-04-18 NOTE — Telephone Encounter (Signed)
Pt called stating she went to Urgent Care in [place].  She had chest xray no pneumonia.  They gave her albuterol and tessalon pearl for cough.  She stated she cannot take albuterol it makes her to jittery and the tessalon pearls do not help with cough.   Can something else be called in  Pt would like a call from donna She has chest congestion  [facility] [address]

## 2020-05-04 ENCOUNTER — Other Ambulatory Visit: Payer: Self-pay | Admitting: Family Medicine

## 2020-05-13 DIAGNOSIS — T148XXA Other injury of unspecified body region, initial encounter: Secondary | ICD-10-CM | POA: Insufficient documentation

## 2020-05-13 DIAGNOSIS — R21 Rash and other nonspecific skin eruption: Secondary | ICD-10-CM | POA: Insufficient documentation

## 2020-05-13 NOTE — Assessment & Plan Note (Signed)
Elevate ice and time to heal.

## 2020-05-13 NOTE — Assessment & Plan Note (Signed)
Apply topical steroid to rash on left leg.

## 2020-05-13 NOTE — Assessment & Plan Note (Signed)
Start Lexapro low dose for mood.  Can use alprazolam as needed.

## 2020-05-15 ENCOUNTER — Telehealth: Payer: Self-pay

## 2020-05-15 NOTE — Telephone Encounter (Signed)
Please schedule patient a virtual visit with Dr. Diona Browner on Friday.

## 2020-05-15 NOTE — Telephone Encounter (Signed)
Pt said Rebecca Allen had covid last month and is still not better. Rebecca Allen is congested, still have a cough, and fatigued. Rebecca Allen would like to know if Dr. Diona Browner would call her in something for it?

## 2020-05-15 NOTE — Telephone Encounter (Signed)
Pt scheduled for 05/17/20 @ 10:40

## 2020-05-17 ENCOUNTER — Telehealth (INDEPENDENT_AMBULATORY_CARE_PROVIDER_SITE_OTHER): Payer: 59 | Admitting: Family Medicine

## 2020-05-17 ENCOUNTER — Encounter: Payer: Self-pay | Admitting: Family Medicine

## 2020-05-17 ENCOUNTER — Other Ambulatory Visit: Payer: Self-pay

## 2020-05-17 VITALS — Ht 65.5 in | Wt 178.0 lb

## 2020-05-17 DIAGNOSIS — R053 Chronic cough: Secondary | ICD-10-CM

## 2020-05-17 DIAGNOSIS — U099 Post covid-19 condition, unspecified: Secondary | ICD-10-CM

## 2020-05-17 MED ORDER — GUAIFENESIN-CODEINE 100-10 MG/5ML PO SYRP: 5.0000 mL | ORAL_SOLUTION | Freq: Every evening | ORAL | 0 refills | Status: DC | PRN

## 2020-05-17 NOTE — Assessment & Plan Note (Addendum)
Post infectious cough vs allergies... no red flags for bacterial infection or cardiac source.  Stop Claritin.. change to Xyzal at bedtime.  Continue flonase 2 sprays per nostril daily.  Can use cough suppressant prescription at night.  Start b 12  100 mg daily. Go to ER if chest pain or shortness of breath.  Call if not improving as expected. Quit smoking!

## 2020-05-17 NOTE — Progress Notes (Signed)
VIRTUAL VISIT Due to national recommendations of social distancing due to [place] 19, a virtual visit is felt to be most appropriate for this patient at this time.   I connected with the patient on 05/17/20 at 10:40 AM EDT by virtual telehealth platform and verified that I am speaking with the correct person using two identifiers.   I discussed the limitations, risks, security and privacy concerns of performing an evaluation and management service by  virtual telehealth platform and the availability of in person appointments. I also discussed with the patient that there may be a patient responsible charge related to this service. The patient expressed understanding and agreed to proceed.  Patient location: Home Provider Location: [place] Participants: Eliezer Lofts and [place]   Chief Complaint  Patient presents with   Cough    from covid     History of Present Illness:  54 year old female presents with continued cough following COVID infection in early 04/2020.  Positive test on 04/11/2020  Received MAB infusion on 04/13/2020  Urgent care visit on 04/17/20 CXR: negative Given albuterol and benzonatate.   She reports she has continued cough,dry and severe fatigue.  No congestion, no ST.. cough is from tickle in throat. No fever.. last elevated temp several weeks ago. Intermittant SOB.Marland Kitchen if pushing herself.. none at rest. No wheeze.  no CP, no palpitations.   She is currently a smoker.Marland Kitchen1-2 cigs a day.  Using warm water Eyes irritated and some post nasal drip.Marland Kitchen using OTC  fluticasone and claritin, nasal decongestant.  COVID 19 screen No recent travel or known exposure to [place] The importance of social distancing was discussed today.   Review of Systems  Constitutional: Negative for chills and fever.  HENT: Negative for congestion and ear pain.   Eyes: Negative for pain and redness.  Respiratory: Positive for cough and shortness of breath.    Cardiovascular: Negative for chest pain, palpitations and leg swelling.  Gastrointestinal: Negative for abdominal pain, blood in stool, constipation, diarrhea, nausea and vomiting.  Genitourinary: Negative for dysuria.  Musculoskeletal: Negative for falls and myalgias.  Skin: Negative for rash.  Neurological: Negative for dizziness.  Psychiatric/Behavioral: Negative for depression. The patient is not nervous/anxious.       Past Medical History:  Diagnosis Date   Depression    GERD (gastroesophageal reflux disease)    Hyperlipidemia    Treadmill stress test negative for angina pectoris 12/18/2010    reports that she has been smoking cigarettes. She has a 7.50 pack-year smoking history. She has never used smokeless tobacco. She reports current alcohol use of about 2.0 standard drinks of alcohol per week. She reports that she does not use drugs.   Current Outpatient Medications:    ALPRAZolam (XANAX) 0.5 MG tablet, Take 0.5-1 tablets (0.25-0.5 mg total) by mouth daily as needed for anxiety., Disp: 30 tablet, Rfl: 0   calcium carbonate (TUMS - DOSED IN MG ELEMENTAL CALCIUM) 500 MG chewable tablet, Chew 6 tablets by mouth daily as needed for indigestion or heartburn., Disp: , Rfl:    estradiol (VIVELLE-DOT) 0.05 MG/24HR patch, Place 1 patch (0.05 mg total) onto the skin 2 (two) times a week., Disp: 24 patch, Rfl: 3   pantoprazole (PROTONIX) 40 MG tablet, TAKE 1 TABLET BY MOUTH EVERY DAY, Disp: 90 tablet, Rfl: 3   triamcinolone cream (KENALOG) 0.5 %, Apply 1 application topically 2 (two) times daily., Disp: 15 g, Rfl: 0   escitalopram (LEXAPRO) 5 MG tablet, Take 0.5 tablets (  2.5 mg total) by mouth daily. (Patient not taking: Reported on 05/17/2020), Disp: 30 tablet, Rfl: 5   Observations/Objective: Height 5' 5.5" (1.664 m), weight 178 lb (80.7 kg).  Physical Exam  Physical Exam Constitutional:      General: The patient is not in acute distress. Pulmonary:     Effort: Pulmonary  effort is normal. No respiratory distress.  Neurological:     Mental Status: The patient is alert and oriented to person, place, and time.  Psychiatric:        Mood and Affect: Mood normal.        Behavior: Behavior normal.   Assessment and Plan  Post-COVID chronic cough  Post infectious cough vs allergies... no red flags for bacterial infection or cardiac source.  Stop Claritin.. change to Xyzal at bedtime.  Continue flonase 2 sprays per nostril daily.  Can use cough suppressant prescription at night.  Start b 12  100 mg daily. Go to ER if chest pain or shortness of breath.  Call if not improving as expected. Quit smoking!      I discussed the assessment and treatment plan with the patient. The patient was provided an opportunity to ask questions and all were answered. The patient agreed with the plan and demonstrated an understanding of the instructions.   The patient was advised to call back or seek an in-person evaluation if the symptoms worsen or if the condition fails to improve as anticipated.     Eliezer Lofts, MD

## 2020-05-17 NOTE — Patient Instructions (Signed)
Stop Claritin.. change to Xyzal at bedtime.  Continue flonase 2 sprays per nostril daily.  Can use cough suppressant prescription at night.  Start b 12  100 mg daily. Go to ER if chest pain or shortness of breath.  Call if not improving as expected. Quit smoking!

## 2020-05-21 ENCOUNTER — Telehealth: Payer: Self-pay

## 2020-05-21 NOTE — Telephone Encounter (Signed)
Pt is 21 days past COVID Dx and no fever... have her make appt for in person exam.

## 2020-05-21 NOTE — Telephone Encounter (Signed)
Pt left a message on triage line stating she is taking the cough syrup at night and is still having the post Covid cough and coughing up dark stuff. Had VV 05-17-20 for Post Covid Cough. She said the cough syrup is not helping. She is not having chest pain. Asking what else she can do for the cough.

## 2020-05-22 NOTE — Telephone Encounter (Signed)
Appointment scheduled for 05/24/2020 at 10:40 am with Dr. Diona Browner.

## 2020-05-24 ENCOUNTER — Encounter: Payer: Self-pay | Admitting: Family Medicine

## 2020-05-24 ENCOUNTER — Ambulatory Visit: Payer: 59 | Admitting: Family Medicine

## 2020-05-24 ENCOUNTER — Other Ambulatory Visit: Payer: Self-pay

## 2020-05-24 VITALS — BP 110/60 | HR 82 | Temp 97.5°F | Ht 65.5 in | Wt 183.0 lb

## 2020-05-24 DIAGNOSIS — R053 Chronic cough: Secondary | ICD-10-CM | POA: Diagnosis not present

## 2020-05-24 DIAGNOSIS — R002 Palpitations: Secondary | ICD-10-CM

## 2020-05-24 DIAGNOSIS — U099 Post covid-19 condition, unspecified: Secondary | ICD-10-CM

## 2020-05-24 NOTE — Progress Notes (Signed)
Chief Complaint  Patient presents with  . Post-Covid F/U    Still coughing and fatigues easily. Has heart fluttering at times.    History of Present Illness: HPI    54 year old female 21 days out from [place].  S/P MAB infusion  Positive test on 04/11/2020  Received MAB infusion on 04/13/2020  Urgent care visit on 04/17/20 CXR: negative Given albuterol and benzonatate.  She continues to report continued cough ( dry and occ dark mucus) and severe fatigue. Minimal improvement even in last few weeks. Has some good days then overexerts self and  Hits a wall.  No fever. She does have spells of SOB.  Occ heart fluttering at rest or with exertion.Marland Kitchen lasts  15 min off and on. No associated lightedness, no CP.    This visit occurred during the SARS-CoV-2 public health emergency.  Safety protocols were in place, including screening questions prior to the visit, additional usage of staff PPE, and extensive cleaning of exam room while observing appropriate contact time as indicated for disinfecting solutions.   COVID 19 screen:  No recent travel or known exposure to COVID19 The patient denies respiratory symptoms of COVID 19 at this time. The importance of social distancing was discussed today.     Review of Systems  Constitutional: Positive for malaise/fatigue. Negative for chills and fever.  HENT: Negative for congestion and ear pain.   Eyes: Negative for pain and redness.  Respiratory: Positive for cough. Negative for shortness of breath.   Cardiovascular: Positive for palpitations. Negative for chest pain and leg swelling.  Gastrointestinal: Negative for abdominal pain, blood in stool, constipation, diarrhea, nausea and vomiting.  Genitourinary: Negative for dysuria.  Musculoskeletal: Negative for falls and myalgias.  Skin: Negative for rash.  Neurological: Negative for dizziness.  Psychiatric/Behavioral: Negative for depression. The patient is not nervous/anxious.        Past Medical History:  Diagnosis Date  . Depression   . GERD (gastroesophageal reflux disease)   . Hyperlipidemia   . Treadmill stress test negative for angina pectoris 12/18/2010    reports that she has been smoking cigarettes. She has a 7.50 pack-year smoking history. She has never used smokeless tobacco. She reports current alcohol use of about 2.0 standard drinks of alcohol per week. She reports that she does not use drugs.   Current Outpatient Medications:  .  ALPRAZolam (XANAX) 0.5 MG tablet, Take 0.5-1 tablets (0.25-0.5 mg total) by mouth daily as needed for anxiety., Disp: 30 tablet, Rfl: 0 .  calcium carbonate (TUMS - DOSED IN MG ELEMENTAL CALCIUM) 500 MG chewable tablet, Chew 6 tablets by mouth daily as needed for indigestion or heartburn., Disp: , Rfl:  .  estradiol (VIVELLE-DOT) 0.05 MG/24HR patch, Place 1 patch (0.05 mg total) onto the skin 2 (two) times a week., Disp: 24 patch, Rfl: 3 .  pantoprazole (PROTONIX) 40 MG tablet, TAKE 1 TABLET BY MOUTH EVERY DAY, Disp: 90 tablet, Rfl: 3 .  triamcinolone cream (KENALOG) 0.5 %, Apply 1 application topically 2 (two) times daily., Disp: 15 g, Rfl: 0 .  escitalopram (LEXAPRO) 5 MG tablet, Take 0.5 tablets (2.5 mg total) by mouth daily. (Patient not taking: Reported on 05/24/2020), Disp: 30 tablet, Rfl: 5   Observations/Objective: Blood pressure 110/60, pulse 82, temperature (!) 97.5 F (36.4 C), height 5' 5.5" (1.664 m), weight 183 lb (83 kg), SpO2 95 %.  Physical Exam Constitutional:      General: She is not in acute distress.  Appearance: Normal appearance. She is well-developed. She is not ill-appearing or toxic-appearing.  HENT:     Head: Normocephalic.     Right Ear: Hearing, tympanic membrane, ear canal and external ear normal. Tympanic membrane is not erythematous, retracted or bulging.     Left Ear: Hearing, tympanic membrane, ear canal and external ear normal. Tympanic membrane is not erythematous, retracted or bulging.      Nose: No mucosal edema or rhinorrhea.     Right Sinus: No maxillary sinus tenderness or frontal sinus tenderness.     Left Sinus: No maxillary sinus tenderness or frontal sinus tenderness.     Mouth/Throat:     Pharynx: Uvula midline.  Eyes:     General: Lids are normal. Lids are everted, no foreign bodies appreciated.     Conjunctiva/sclera: Conjunctivae normal.     Pupils: Pupils are equal, round, and reactive to light.  Neck:     Thyroid: No thyroid mass or thyromegaly.     Vascular: No carotid bruit.     Trachea: Trachea normal.  Cardiovascular:     Rate and Rhythm: Normal rate and regular rhythm.     Pulses: Normal pulses.     Heart sounds: Normal heart sounds, S1 normal and S2 normal. No murmur heard.  No friction rub. No gallop.   Pulmonary:     Effort: Pulmonary effort is normal. No tachypnea or respiratory distress.     Breath sounds: Normal breath sounds. No decreased breath sounds, wheezing, rhonchi or rales.  Abdominal:     General: Bowel sounds are normal.     Palpations: Abdomen is soft.     Tenderness: There is no abdominal tenderness.  Musculoskeletal:     Cervical back: Normal range of motion and neck supple.  Skin:    General: Skin is warm and dry.     Findings: No rash.  Neurological:     Mental Status: She is alert.  Psychiatric:        Mood and Affect: Mood is not anxious or depressed.        Speech: Speech normal.        Behavior: Behavior normal. Behavior is cooperative.        Thought Content: Thought content normal.        Judgment: Judgment normal.      Assessment and Plan   Palpitations  EKG unremarkable.  Stop decongestant.  if continuing refer to cardiology for heart rate monitor.  Post-COVID chronic cough  Normal lung exam, no clear indication for X-ray or additional steroids.     Eliezer Lofts, MD

## 2020-05-24 NOTE — Patient Instructions (Addendum)
Stop decongestants and avoid caffeine.  Call for cardiology referral if palpitations not improving with time.

## 2020-05-24 NOTE — Assessment & Plan Note (Signed)
EKG unremarkable.  Stop decongestant.  if continuing refer to cardiology for heart rate monitor.

## 2020-05-24 NOTE — Assessment & Plan Note (Signed)
Normal lung exam, no clear indication for X-ray or additional steroids.

## 2020-05-25 ENCOUNTER — Other Ambulatory Visit: Payer: Self-pay | Admitting: Family Medicine

## 2020-05-28 NOTE — Telephone Encounter (Signed)
Last fill 04/02/20  #30/5 Pt should have enough until Jan 2022

## 2020-06-11 ENCOUNTER — Other Ambulatory Visit: Payer: Self-pay | Admitting: Obstetrics and Gynecology

## 2020-06-11 DIAGNOSIS — E8941 Symptomatic postprocedural ovarian failure: Secondary | ICD-10-CM

## 2020-06-18 ENCOUNTER — Ambulatory Visit: Payer: 59 | Admitting: Family Medicine

## 2020-06-18 ENCOUNTER — Encounter: Payer: Self-pay | Admitting: Family Medicine

## 2020-06-18 ENCOUNTER — Other Ambulatory Visit: Payer: Self-pay

## 2020-06-18 VITALS — BP 114/76 | HR 83 | Temp 98.0°F | Ht 65.5 in | Wt 186.5 lb

## 2020-06-18 DIAGNOSIS — R319 Hematuria, unspecified: Secondary | ICD-10-CM | POA: Insufficient documentation

## 2020-06-18 LAB — POC URINALSYSI DIPSTICK (AUTOMATED)
Bilirubin, UA: NEGATIVE
Glucose, UA: NEGATIVE
Ketones, UA: NEGATIVE
Leukocytes, UA: NEGATIVE
Nitrite, UA: NEGATIVE
Protein, UA: NEGATIVE
Spec Grav, UA: 1.02 (ref 1.010–1.025)
Urobilinogen, UA: 0.2 E.U./dL
pH, UA: 6 (ref 5.0–8.0)

## 2020-06-18 MED ORDER — HYDROCODONE-ACETAMINOPHEN 5-325 MG PO TABS
1.0000 | ORAL_TABLET | Freq: Four times a day (QID) | ORAL | 0 refills | Status: DC | PRN
Start: 2020-06-18 — End: 2021-05-06

## 2020-06-18 NOTE — Assessment & Plan Note (Addendum)
Most consistent with kidney stone. No sign of infeciton.  Hematuria, CVA tenderness and past stone seen o  2019 CT.   Pt does not want to have CT at this time... will try to pass.. only CT if not passing as expected.  Treat with increased fluids, strain urine, pain control with hydrocodone.  Return precaustions and ER precautions reviewed in detail with pt.

## 2020-06-18 NOTE — Patient Instructions (Addendum)
Increase water intake. Start to strain urine. Pain control with hydrocodone for  pain. Call if fever, pain not controlled or not able to urinate or keep down liquids. Also call if stone not passed by end of week.     Kidney Stones  Kidney stones are solid, rock-like deposits that form inside of the kidneys. The kidneys are a pair of organs that make urine. A kidney stone may form in a kidney and move into other parts of the urinary tract, including the tubes that connect the kidneys to the bladder (ureters), the bladder, and the tube that carries urine out of the body (urethra). As the stone moves through these areas, it can cause intense pain and block the flow of urine. Kidney stones are created when high levels of certain minerals are found in the urine. The stones are usually passed out of the body through urination, but in some cases, medical treatment may be needed to remove them. What are the causes? Kidney stones may be caused by:  A condition in which certain glands produce too much parathyroid hormone (primary hyperparathyroidism), which causes too much calcium buildup in the blood.  A buildup of uric acid crystals in the bladder (hyperuricosuria). Uric acid is a chemical that the body produces when you eat certain foods. It usually exits the body in the urine.  Narrowing (stricture) of one or both of the ureters.  A kidney blockage that is present at birth (congenital obstruction).  Past surgery on the kidney or the ureters, such as gastric bypass surgery. What increases the risk? The following factors may make you more likely to develop this condition:  Having had a kidney stone in the past.  Having a family history of kidney stones.  Not drinking enough water.  Eating a diet that is high in protein, salt (sodium), or sugar.  Being overweight or obese. What are the signs or symptoms? Symptoms of a kidney stone may include:  Pain in the side of the abdomen, right  below the ribs (flank pain). Pain usually spreads (radiates) to the groin.  Needing to urinate frequently or urgently.  Painful urination.  Blood in the urine (hematuria).  Nausea.  Vomiting.  Fever and chills. How is this diagnosed? This condition may be diagnosed based on:  Your symptoms and medical history.  A physical exam.  Blood tests.  Urine tests. These may be done before and after the stone passes out of your body through urination.  Imaging tests, such as a CT scan, abdominal X-ray, or ultrasound.  A procedure to examine the inside of the bladder (cystoscopy). How is this treated? Treatment for kidney stones depends on the size, location, and makeup of the stones. Kidney stones will often pass out of the body through urination. You may need to:  Increase your fluid intake to help pass the stone. In some cases, you may be given fluids through an IV and may need to be monitored at the hospital.  Take medicine for pain.  Make changes in your diet to help prevent kidney stones from coming back. Sometimes, medical procedures are needed to remove a kidney stone. This may involve:  A procedure to break up kidney stones using: ? A focused beam of light (laser therapy). ? Shock waves (extracorporeal shock wave lithotripsy).  Surgery to remove kidney stones. This may be needed if you have severe pain or have stones that block your urinary tract. Follow these instructions at home: Medicines  Take over-the-counter and prescription  medicines only as told by your health care provider.  Ask your health care provider if the medicine prescribed to you requires you to avoid driving or using heavy machinery. Eating and drinking  Drink enough fluid to keep your urine pale yellow. You may be instructed to drink at least 8-10 glasses of water each day. This will help you pass the kidney stone.  If directed, change your diet. This may include: ? Limiting how much sodium you  eat. ? Eating more fruits and vegetables. ? Limiting how much animal protein--such as red meat, poultry, fish, and eggs--you eat.  Follow instructions from your health care provider about eating or drinking restrictions. General instructions  Collect urine samples as told by your health care provider. You may need to collect a urine sample: ? 24 hours after you pass the stone. ? 8-12 weeks after passing the kidney stone, and every 6-12 months after that.  Strain your urine every time you urinate, for as long as directed. Use the strainer that your health care provider recommends.  Do not throw out the kidney stone after passing it. Keep the stone so it can be tested by your health care provider. Testing the makeup of your kidney stone may help prevent you from getting kidney stones in the future.  Keep all follow-up visits as told by your health care provider. This is important. You may need follow-up X-rays or ultrasounds to make sure that your stone has passed. How is this prevented? To prevent another kidney stone:  Drink enough fluid to keep your urine pale yellow. This is the best way to prevent kidney stones.  Eat a healthy diet and follow recommendations from your health care provider about foods to avoid. You may be instructed to eat a low-protein diet. Recommendations vary depending on the type of kidney stone that you have.  Maintain a healthy weight. Where to find more information  [place] [facility]): www.kidney.[place] [facility]): www.urologyhealth.org Contact a health care provider if:  You have pain that gets worse or does not get better with medicine. Get help right away if:  You have a fever or chills.  You develop severe pain.  You develop new abdominal pain.  You faint.  You are unable to urinate. Summary  Kidney stones are solid, rock-like deposits that form inside of the kidneys.  Kidney stones can cause nausea,  vomiting, blood in the urine, abdominal pain, and the urge to urinate frequently.  Treatment for kidney stones depends on the size, location, and makeup of the stones. Kidney stones will often pass out of the body through urination.  Kidney stones can be prevented by drinking enough fluids, eating a healthy diet, and maintaining a healthy weight. This information is not intended to replace advice given to you by your health care provider. Make sure you discuss any questions you have with your health care provider. Document Revised: 12/13/2018 Document Reviewed: 12/13/2018 Elsevier Patient Education  [place].

## 2020-06-18 NOTE — Progress Notes (Signed)
Chief Complaint  Patient presents with  . Back Pain    Left  . Hematuria    History of Present Illness: HPI  54 year old female patient with history of chronic low back pain on left with sciatica presents with new onset pain in left mid back. This feels very different form her previous back history.   Her symptoms started 6 days ago.. started with left mid back pain.Marland Kitchen still there... pain is pretty severe 10/10.  She has also noted blood in urine... dark.. later that day.  Pain has remained stable.  No N/V, no D/C.  No fever.  No dysuria.  UA in office today showed large blood.. no wbc or nitrite.    no history of kidney stone.  She is using tylenol for pain. Not helping much.  2019... CT showed punctate stone on left kidney pole  This visit occurred during the SARS-CoV-2 public health emergency.  Safety protocols were in place, including screening questions prior to the visit, additional usage of staff PPE, and extensive cleaning of exam room while observing appropriate contact time as indicated for disinfecting solutions.   COVID 19 screen:  No recent travel or known exposure to COVID19 The patient denies respiratory symptoms of COVID 19 at this time. The importance of social distancing was discussed today.     ROS    Past Medical History:  Diagnosis Date  . Depression   . GERD (gastroesophageal reflux disease)   . Hyperlipidemia   . Treadmill stress test negative for angina pectoris 12/18/2010    reports that she has been smoking cigarettes. She has a 7.50 pack-year smoking history. She has never used smokeless tobacco. She reports current alcohol use of about 2.0 standard drinks of alcohol per week. She reports that she does not use drugs.   Current Outpatient Medications:  .  ALPRAZolam (XANAX) 0.5 MG tablet, Take 0.5-1 tablets (0.25-0.5 mg total) by mouth daily as needed for anxiety., Disp: 30 tablet, Rfl: 0 .  calcium carbonate (TUMS - DOSED IN MG ELEMENTAL  CALCIUM) 500 MG chewable tablet, Chew 6 tablets by mouth daily as needed for indigestion or heartburn., Disp: , Rfl:  .  escitalopram (LEXAPRO) 5 MG tablet, Take 0.5 tablets (2.5 mg total) by mouth daily., Disp: 30 tablet, Rfl: 5 .  estradiol (VIVELLE-DOT) 0.05 MG/24HR patch, PLACE 1 PATCH TRANS-DERMALLY TWICE A WEEK. REMOVE OLD PATCH BEFORE APPLYING NEW PATCH., Disp: 24 patch, Rfl: 2 .  pantoprazole (PROTONIX) 40 MG tablet, TAKE 1 TABLET BY MOUTH EVERY DAY, Disp: 90 tablet, Rfl: 3   Observations/Objective: Blood pressure 114/76, pulse 83, temperature 98 F (36.7 C), temperature source Temporal, height 5' 5.5" (1.664 m), weight 186 lb 8 oz (84.6 kg), SpO2 96 %.  Physical Exam Constitutional:      General: She is not in acute distress.    Appearance: Normal appearance. She is well-developed. She is not ill-appearing or toxic-appearing.  HENT:     Head: Normocephalic.     Right Ear: Hearing, tympanic membrane, ear canal and external ear normal. Tympanic membrane is not erythematous, retracted or bulging.     Left Ear: Hearing, tympanic membrane, ear canal and external ear normal. Tympanic membrane is not erythematous, retracted or bulging.     Nose: No mucosal edema or rhinorrhea.     Right Sinus: No maxillary sinus tenderness or frontal sinus tenderness.     Left Sinus: No maxillary sinus tenderness or frontal sinus tenderness.     Mouth/Throat:  Pharynx: Uvula midline.  Eyes:     General: Lids are normal. Lids are everted, no foreign bodies appreciated.     Conjunctiva/sclera: Conjunctivae normal.     Pupils: Pupils are equal, round, and reactive to light.  Neck:     Thyroid: No thyroid mass or thyromegaly.     Vascular: No carotid bruit.     Trachea: Trachea normal.  Cardiovascular:     Rate and Rhythm: Normal rate and regular rhythm.     Pulses: Normal pulses.     Heart sounds: Normal heart sounds, S1 normal and S2 normal. No murmur heard.  No friction rub. No gallop.    Pulmonary:     Effort: Pulmonary effort is normal. No tachypnea or respiratory distress.     Breath sounds: Normal breath sounds. No decreased breath sounds, wheezing, rhonchi or rales.  Abdominal:     General: Bowel sounds are normal.     Palpations: Abdomen is soft.     Tenderness: There is no abdominal tenderness. There is left CVA tenderness. There is no guarding or rebound.  Musculoskeletal:     Cervical back: Normal range of motion and neck supple.  Skin:    General: Skin is warm and dry.     Findings: No rash.  Neurological:     Mental Status: She is alert.  Psychiatric:        Mood and Affect: Mood is not anxious or depressed.        Speech: Speech normal.        Behavior: Behavior normal. Behavior is cooperative.        Thought Content: Thought content normal.        Judgment: Judgment normal.      Assessment and Plan   Hematuria Most consistent with kidney stone. No sign of infeciton.  Hematuria, CVA tenderness and past stone seen o  2019 CT.   Pt does not want to have CT at this time... will try to pass.. only CT if not passing as expected.  Treat with increased fluids, strain urine, pain control with hydrocodone.  Return precaustions and ER precautions reviewed in detail with pt.     Eliezer Lofts, MD

## 2020-06-19 ENCOUNTER — Telehealth: Payer: Self-pay

## 2020-06-19 ENCOUNTER — Emergency Department
Admission: EM | Admit: 2020-06-19 | Discharge: 2020-06-19 | Disposition: A | Discharge status: Home / Self Care | Payer: 59 | Attending: Emergency Medicine | Admitting: Emergency Medicine

## 2020-06-19 ENCOUNTER — Other Ambulatory Visit: Payer: Self-pay | Admitting: Family Medicine

## 2020-06-19 ENCOUNTER — Telehealth: Payer: Self-pay | Admitting: Family Medicine

## 2020-06-19 ENCOUNTER — Emergency Department: Payer: 59

## 2020-06-19 ENCOUNTER — Encounter: Payer: Self-pay | Admitting: Emergency Medicine

## 2020-06-19 ENCOUNTER — Other Ambulatory Visit: Payer: Self-pay

## 2020-06-19 DIAGNOSIS — Z79899 Other long term (current) drug therapy: Secondary | ICD-10-CM | POA: Diagnosis not present

## 2020-06-19 DIAGNOSIS — K219 Gastro-esophageal reflux disease without esophagitis: Secondary | ICD-10-CM | POA: Diagnosis not present

## 2020-06-19 DIAGNOSIS — F17210 Nicotine dependence, cigarettes, uncomplicated: Secondary | ICD-10-CM | POA: Diagnosis not present

## 2020-06-19 DIAGNOSIS — N200 Calculus of kidney: Secondary | ICD-10-CM | POA: Insufficient documentation

## 2020-06-19 DIAGNOSIS — R109 Unspecified abdominal pain: Secondary | ICD-10-CM | POA: Diagnosis present

## 2020-06-19 LAB — URINALYSIS, COMPLETE (UACMP) WITH MICROSCOPIC
Bilirubin Urine: NEGATIVE
Glucose, UA: NEGATIVE mg/dL
Hgb urine dipstick: NEGATIVE
Ketones, ur: NEGATIVE mg/dL
Leukocytes,Ua: NEGATIVE
Nitrite: NEGATIVE
Protein, ur: 100 mg/dL — AB
Specific Gravity, Urine: 1.027 (ref 1.005–1.030)
pH: 5 (ref 5.0–8.0)

## 2020-06-19 LAB — CBC
HCT: 46.3 % — ABNORMAL HIGH (ref 36.0–46.0)
Hemoglobin: 15.6 g/dL — ABNORMAL HIGH (ref 12.0–15.0)
MCH: 31.5 pg (ref 26.0–34.0)
MCHC: 33.7 g/dL (ref 30.0–36.0)
MCV: 93.3 fL (ref 80.0–100.0)
Platelets: 302 10*3/uL (ref 150–400)
RBC: 4.96 MIL/uL (ref 3.87–5.11)
RDW: 13.1 % (ref 11.5–15.5)
WBC: 8.2 10*3/uL (ref 4.0–10.5)
nRBC: 0 % (ref 0.0–0.2)

## 2020-06-19 LAB — BASIC METABOLIC PANEL
Anion gap: 10 (ref 5–15)
BUN: 15 mg/dL (ref 6–20)
CO2: 21 mmol/L — ABNORMAL LOW (ref 22–32)
Calcium: 9.6 mg/dL (ref 8.9–10.3)
Chloride: 106 mmol/L (ref 98–111)
Creatinine, Ser: 0.76 mg/dL (ref 0.44–1.00)
GFR, Estimated: >60 mL/min (ref >60)
Glucose, Bld: 108 mg/dL — ABNORMAL HIGH (ref 70–99)
Potassium: 4.2 mmol/L (ref 3.5–5.1)
Sodium: 137 mmol/L (ref 135–145)

## 2020-06-19 MED ORDER — ONDANSETRON HCL 4 MG PO TABS: 4.0000 mg | ORAL_TABLET | Freq: Three times a day (TID) | ORAL | 0 refills | Status: DC | PRN

## 2020-06-19 MED ORDER — ONDANSETRON HCL 4 MG/2ML IJ SOLN
4.0000 mg | Freq: Once | INTRAMUSCULAR | Status: AC
  Administered 2020-06-19: 4 mg via INTRAVENOUS
  Filled 2020-06-19: qty 2

## 2020-06-19 MED ORDER — TAMSULOSIN HCL 0.4 MG PO CAPS: 0.4000 mg | ORAL_CAPSULE | Freq: Every day | ORAL | 0 refills | Status: DC

## 2020-06-19 MED ORDER — ONDANSETRON 4 MG PO TBDP: 4.0000 mg | ORAL_TABLET | Freq: Three times a day (TID) | ORAL | 0 refills | Status: DC | PRN

## 2020-06-19 MED ORDER — OXYCODONE-ACETAMINOPHEN 5-325 MG PO TABS
1.0000 | ORAL_TABLET | ORAL | 0 refills | Status: DC | PRN
Start: 2020-06-19 — End: 2020-06-27

## 2020-06-19 MED ORDER — FENTANYL CITRATE (PF) 100 MCG/2ML IJ SOLN
100.0000 ug | Freq: Once | INTRAMUSCULAR | Status: AC
  Administered 2020-06-19: 100 ug via INTRAVENOUS
  Filled 2020-06-19: qty 2

## 2020-06-19 MED ORDER — KETOROLAC TROMETHAMINE 30 MG/ML IJ SOLN
30.0000 mg | Freq: Once | INTRAMUSCULAR | Status: AC
  Administered 2020-06-19: 30 mg via INTRAVENOUS
  Filled 2020-06-19: qty 1

## 2020-06-19 MED ORDER — SODIUM CHLORIDE 0.9 % IV BOLUS
1000.0000 mL | Freq: Once | INTRAVENOUS | Status: AC
  Administered 2020-06-19: 1000 mL via INTRAVENOUS

## 2020-06-19 NOTE — ED Notes (Signed)
Pain down to 7/10. Nausea is gone.

## 2020-06-19 NOTE — Telephone Encounter (Signed)
Error see other phonenote for 06/19/20.

## 2020-06-19 NOTE — Telephone Encounter (Signed)
Noted. I will call in zofran for nausea but if she cannot keep down liquids or pain meds... needs to go to ER.

## 2020-06-19 NOTE — Telephone Encounter (Signed)
OK 

## 2020-06-19 NOTE — Telephone Encounter (Signed)
Per chart review tab pt is presently at [facility] ED. 

## 2020-06-19 NOTE — Telephone Encounter (Signed)
Pt called in due to she was in yesterday and was diagnosed with a kidney stone and was told to call back if anything changed. She stated that she has been vomiting and she has diarrhea. I did send her to the nurseline to be triaged.

## 2020-06-19 NOTE — ED Provider Notes (Signed)
[facility] Emergency Department Provider Note  Time seen: 9:29 AM  I have reviewed the triage vital signs and the nursing notes.   HISTORY  Chief Complaint Flank Pain and Emesis   HPI Rebecca Allen is a 54 y.o. female with a past medical history of depression, gastric reflux, hyperlipidemia, presents to the emergency department for left flank pain.  According to the patient she has been experiencing intermittent left flank pain over the past 1 week or so, states she was experiencing blood in her urine Thursday and Friday but it went away over the weekend.  Patient denies any history of a kidney stone previously.  Describes her pain as significant sharp pain to the left flank.  Denies any dysuria at any point.  Patient has been nauseated with vomiting and diarrhea this morning.  No fever.  No shortness of breath cough or chest pain.  Past Medical History:  Diagnosis Date  . Depression   . GERD (gastroesophageal reflux disease)   . Hyperlipidemia   . Treadmill stress test negative for angina pectoris 12/18/2010    Patient Active Problem List   Diagnosis Date Noted  . Hematuria 06/18/2020  . Rash 05/13/2020  . Hematoma 05/13/2020  . Upper respiratory infection 11/29/2019  . Post-COVID chronic cough 11/15/2018  . Allergic sinusitis 11/11/2018  . Chronic insomnia 10/18/2018  . Symptomatic mammary hypertrophy 09/30/2018  . Current moderate episode of major depressive disorder without prior episode ([place]) 09/06/2018  . Gastritis 01/10/2018  . Palpitations 05/25/2017  . Chronic cervical radiculopathy 07/07/2016  . Lymphadenopathy 02/07/2016  . Post-traumatic stress 10/05/2014  . Panic attacks 07/25/2013  . Chronic low back pain with left-sided sciatica 05/30/2010  . Generalized anxiety disorder 05/16/2009  . PERS HX TOBACCO USE PRESENTING HAZARDS HEALTH 05/07/2008  . Mixed hyperlipidemia 03/28/2008  . COMMON MIGRAINE 03/28/2008  . GERD 03/28/2008  .  COSTOCHONDRITIS, RECURRENT 03/28/2008  . Atypical chest pain 03/28/2008    Past Surgical History:  Procedure Laterality Date  . ABDOMINAL HYSTERECTOMY     with 1 ovary removed; remaining over removed a yr later  . CHOLECYSTECTOMY    . COLONOSCOPY      Prior to Admission medications   Medication Sig Start Date End Date Taking? Authorizing Provider  ALPRAZolam Duanne Moron) 0.5 MG tablet Take 0.5-1 tablets (0.25-0.5 mg total) by mouth daily as needed for anxiety. 04/02/20   Bedsole, Amy E, MD  calcium carbonate (TUMS - DOSED IN MG ELEMENTAL CALCIUM) 500 MG chewable tablet Chew 6 tablets by mouth daily as needed for indigestion or heartburn.    [provider]  escitalopram (LEXAPRO) 5 MG tablet Take 0.5 tablets (2.5 mg total) by mouth daily. 04/02/20   Bedsole, Amy E, MD  estradiol (VIVELLE-DOT) 0.05 MG/24HR patch PLACE 1 PATCH TRANS-DERMALLY TWICE A WEEK. REMOVE OLD PATCH BEFORE APPLYING NEW PATCH. 29/9/24   Copland, Deirdre Evener, PA-C  HYDROcodone-acetaminophen (NORCO/VICODIN) 5-325 MG tablet Take 1 tablet by mouth every 6 (six) hours as needed for up to 5 days for moderate pain. 06/18/20 06/23/20  Bedsole, Amy E, MD  ondansetron (ZOFRAN) 4 MG tablet Take 1 tablet (4 mg total) by mouth every 8 (eight) hours as needed for nausea or vomiting. 06/19/20   Bedsole, Amy E, MD  pantoprazole (PROTONIX) 40 MG tablet TAKE 1 TABLET BY MOUTH EVERY DAY 05/04/20   Owens Loffler, MD    Allergies  Allergen Reactions  . Azithromycin     GI side eff  . Metoclopramide Hcl  REACTION: Nervousness  . Morphine And Related Nausea Only  . Penicillins     REACTION: Upsets stomach  . Venlafaxine Other (See Comments)    Hot flashes, clammy and nausea    Family History  Problem Relation Age of Onset  . COPD Mother   . Heart attack Father 38  . Hypertension Father   . Heart disease Father        stent placement   . Leukemia Brother   . Kidney failure Maternal Grandfather   . COPD Paternal Grandmother    . Congenital heart disease Sister   . Breast cancer Neg Hx     Social History Social History   Tobacco Use  . Smoking status: Current Every Day Smoker    Packs/day: 0.25    Years: 30.00    Pack years: 7.50    Types: Cigarettes  . Smokeless tobacco: Never Used  Vaping Use  . Vaping Use: Never used  Substance Use Topics  . Alcohol use: Yes    Alcohol/week: 2.0 standard drinks    Types: 1 Glasses of wine, 1 Cans of beer per week    Comment: beer ocassionally  . Drug use: No    Review of Systems Constitutional: Negative for fever. Cardiovascular: Negative for chest pain. Respiratory: Negative for shortness of breath. Gastrointestinal: Sharp left sided abdominal pain Genitourinary: Negative for urinary compaints Musculoskeletal: Negative for musculoskeletal complaints Neurological: Negative for headache All other ROS negative  ____________________________________________   PHYSICAL EXAM:  VITAL SIGNS: ED Triage Vitals  Enc Vitals Group     BP 06/19/20 0850 (!) 134/57     Pulse Rate 06/19/20 0850 91     Resp 06/19/20 0850 20     Temp --      Temp src --      SpO2 06/19/20 0850 95 %     Weight --      Height --      Head Circumference --      Peak Flow --      Pain Score 06/19/20 0835 10     Pain Loc --      Pain Edu? --      Excl. in [place]? --    Constitutional: Alert and oriented. Well appearing and in no distress. Eyes: Normal exam ENT      Head: Normocephalic and atraumatic.      Mouth/Throat: Mucous membranes are moist. Cardiovascular: Normal rate, regular rhythm.  Respiratory: Normal respiratory effort without tachypnea nor retractions. Breath sounds are clear  Gastrointestinal: Soft, slight left lower quadrant tenderness.  No rebound guarding or distention.  No CVA tenderness. Musculoskeletal: Nontender with normal range of motion in all extremities.  Neurologic:  Normal speech and language. No gross focal neurologic deficits  Skin:  Skin is warm,  dry and intact.  Psychiatric: Mood and affect are normal.   ____________________________________________   RADIOLOGY  CT consistent with 5 mm stone at the renal pelvis on the left side.  ____________________________________________   INITIAL IMPRESSION / ASSESSMENT AND PLAN / ED COURSE  Pertinent labs & imaging results that were available during my care of the patient were reviewed by me and considered in my medical decision making (see chart for details).   Patient presents emergency department for left-sided abdominal pain been intermittent over the past 1 week worse this morning moderate to severe currently sharp.  Differential would include ureterolithiasis, UTI or pyelonephritis, colitis or diverticulitis.  [name] exam is very consistent with ureterolithiasis.  We will  treat pain nausea, IV hydrate and obtain a CT renal scan while awaiting for the lab work.  Patient agreeable to plan of care.  CT shows 5 mm left renal pelvis stone likely cause of the patient's discomfort.  I personally reviewed the CT images it appears to be right at the cusp of the renal pelvis and ureter.  [name] lab work is nonrevealing.  Urine culture has been added on as a precaution.  Patient states her pain is gone from a 10 to a 7, we will dose fentanyl and reassess.  As long the patient can be pain controlled we will discharge home with Toradol, Percocet Flomax and urology follow-up as the patient may require intervention.  Provided my normal kidney stone return precautions.  Rebecca Allen was evaluated in Emergency Department on 06/19/2020 for the symptoms described in the history of present illness. She was evaluated in the context of the global COVID-19 pandemic, which necessitated consideration that the patient might be at risk for infection with the SARS-CoV-2 virus that causes COVID-19. Institutional protocols and algorithms that pertain to the evaluation of patients at risk for COVID-19 are in  a state of rapid change based on information released by regulatory bodies including the CDC and federal and state organizations. These policies and algorithms were followed during the patient's care in the ED.  ____________________________________________   FINAL CLINICAL IMPRESSION(S) / ED DIAGNOSES  Kidney stone   Harvest Dark, MD 06/19/20 1159

## 2020-06-19 NOTE — ED Triage Notes (Signed)
Pt reports a few days ago started with sudden onset of left flank pain. Pt reports now having some nausea and diarrhea. Pt reports her MD advised her to come to the ED immediately for a CT scan to see where the stone was.

## 2020-06-19 NOTE — Telephone Encounter (Signed)
[facility] - Client TELEPHONE ADVICE RECORD [facility] Patient Name: Rebecca Allen Gender: Female DOB: [birth year] Age: 54 Y 87 M 20 D Return Phone Number: [phone] (Primary) Address: City/State/Zip: [place] [address] Client [facility] - Client Client Site [place] - Day Physician Eliezer Lofts - MD Contact Type Call Who Is Calling Patient / Member / Family / Caregiver Call Type Triage / Clinical Relationship To Patient Self Return Phone Number [phone] (Primary) Chief Complaint ABDOMINAL PAIN - Severe and only in abdomen Reason for Call Symptomatic / Request for [place] states she was dx with kidney stone, diarrhea, and No fever. She has urinated recently. She has severe left abdominal pain. [name] [facility] Translation No Nurse Assessment Nurse: Hammonds, RN, Lissa Date/Time (Eastern Time): 06/19/2020 8:17:28 AM Confirm and document reason for call. If symptomatic, describe symptoms. ---Caller states: The symptoms started thursday and I was dx with kidney stone yesterday. No I have pain 10/10 on severe left side in lower abdomen and back. I am having diarrhea and vomiting. I have urinated and couldn't see blood but it is very dark. Does the patient have any new or worsening symptoms? ---Yes Will a triage be completed? ---Yes Related visit to physician within the last 2 weeks? ---Yes Does the PT have any chronic conditions? (i.e. diabetes, asthma, this includes High risk factors for pregnancy, etc.) ---No Is the patient pregnant or possibly pregnant? (Ask all females between the ages of 21-55) ---No Is this a behavioral health or substance abuse call? ---No Guidelines Guideline Title Affirmed Question Affirmed Notes Nurse Date/Time (Eastern Time) Abdominal Pain - Female [1] SEVERE pain (e.g., excruciating) AND  [2] present > 1 hour Hammonds, RN, Lissa 06/19/2020 8:19:16 AM Disp. Time Eilene Ghazi Time) Disposition Final User 06/19/2020 8:15:51 AM Send to Urgent Ezra Sites 06/19/2020 8:20:23 AM Go to ED Now Yes Hammonds, RN, Lissa PLEASE NOTE: All timestamps contained within this report are represented as [place] Standard Time. CONFIDENTIALTY NOTICE: This fax transmission is intended only for the addressee. It contains information that is legally privileged, confidential or otherwise protected from use or disclosure. If you are not the intended recipient, you are strictly prohibited from reviewing, disclosing, copying using or disseminating any of this information or taking any action in reliance on or regarding this information. If you have received this fax in error, please notify us immediately by telephone so that we can arrange for its return to [place]. Phone: [phone], Toll-Free: [phone], Fax: [phone] Page: 2 of 2 Call Id: [id number] [place] Disagree/Comply Comply Caller Understands Yes PreDisposition Did not know what to do Care Advice Given Per Guideline GO TO ED NOW: * You need to be seen in the Emergency Department. ANOTHER ADULT SHOULD DRIVE: NOTHING BY MOUTH: CARE ADVICE given per Abdominal Pain, Female (Adult) guideline. Referrals GO TO FACILITY OTHER - SPECIFY

## 2020-06-19 NOTE — Telephone Encounter (Signed)
Spoke with Tish,  She is currently in the ER.

## 2020-06-20 LAB — URINE CULTURE: Culture: <10000 — AB

## 2020-06-24 ENCOUNTER — Other Ambulatory Visit: Payer: Self-pay

## 2020-06-24 DIAGNOSIS — N200 Calculus of kidney: Secondary | ICD-10-CM

## 2020-06-25 ENCOUNTER — Ambulatory Visit
Admission: RE | Admit: 2020-06-25 | Discharge: 2020-06-25 | Disposition: A | Discharge status: Home / Self Care | Payer: 59 | Attending: Urology | Admitting: Urology

## 2020-06-25 ENCOUNTER — Encounter: Payer: Self-pay | Admitting: Urology

## 2020-06-25 ENCOUNTER — Ambulatory Visit: Payer: 59 | Admitting: Urology

## 2020-06-25 ENCOUNTER — Other Ambulatory Visit: Payer: Self-pay

## 2020-06-25 ENCOUNTER — Ambulatory Visit
Admission: RE | Admit: 2020-06-25 | Discharge: 2020-06-25 | Disposition: A | Discharge status: Home / Self Care | Payer: 59 | Source: Ambulatory Visit | Attending: Urology | Admitting: Urology

## 2020-06-25 VITALS — BP 114/76 | HR 78 | Ht 65.0 in | Wt 183.0 lb

## 2020-06-25 DIAGNOSIS — N201 Calculus of ureter: Secondary | ICD-10-CM | POA: Diagnosis not present

## 2020-06-25 DIAGNOSIS — N200 Calculus of kidney: Secondary | ICD-10-CM

## 2020-06-25 NOTE — Patient Instructions (Signed)
Lithotripsy  Lithotripsy is a treatment that can sometimes help eliminate kidney stones and the pain that they cause. A form of lithotripsy, also known as extracorporeal shock wave lithotripsy, is a nonsurgical procedure that crushes a kidney stone with shock waves. These shock waves pass through your body and focus on the kidney stone. They cause the kidney stone to break up while it is still in the urinary tract. This makes it easier for the smaller pieces of stone to pass in the urine. Tell a health care provider about:  Any allergies you have.  All medicines you are taking, including vitamins, herbs, eye drops, creams, and over-the-counter medicines.  Any blood disorders you have.  Any surgeries you have had.  Any medical conditions you have.  Whether you are pregnant or may be pregnant.  Any problems you or family members have had with anesthetic medicines. What are the risks? Generally, this is a safe procedure. However, problems may occur, including:  Infection.  Bleeding of the kidney.  Bruising of the kidney or skin.  Scarring of the kidney, which can lead to: ? Increased blood pressure. ? Poor kidney function. ? Return (recurrence) of kidney stones.  Damage to other structures or organs, such as the liver, colon, spleen, or pancreas.  Blockage (obstruction) of the the tube that carries urine from the kidney to the bladder (ureter).  Failure of the kidney stone to break into pieces (fragments). What happens before the procedure? Staying hydrated Follow instructions from your health care provider about hydration, which may include:  Up to 2 hours before the procedure - you may continue to drink clear liquids, such as water, clear fruit juice, black coffee, and plain tea. Eating and drinking restrictions Follow instructions from your health care provider about eating and drinking, which may include:  8 hours before the procedure - stop eating heavy meals or foods  such as meat, fried foods, or fatty foods.  6 hours before the procedure - stop eating light meals or foods, such as toast or cereal.  6 hours before the procedure - stop drinking milk or drinks that contain milk.  2 hours before the procedure - stop drinking clear liquids. General instructions  Plan to have someone take you home from the hospital or clinic.  Ask your health care provider about: ? Changing or stopping your regular medicines. This is especially important if you are taking diabetes medicines or blood thinners. ? Taking medicines such as aspirin and ibuprofen. These medicines and other NSAIDs can thin your blood. Do not take these medicines for 7 days before your procedure if your health care provider instructs you not to.  You may have tests, such as: ? Blood tests. ? Urine tests. ? Imaging tests, such as a CT scan. What happens during the procedure?  To lower your risk of infection: ? Your health care team will wash or sanitize their hands. ? Your skin will be washed with soap.  An IV tube will be inserted into one of your veins. This tube will give you fluids and medicines.  You will be given one or more of the following: ? A medicine to help you relax (sedative). ? A medicine to make you fall asleep (general anesthetic).  A water-filled cushion may be placed behind your kidney or on your abdomen. In some cases you may be placed in a tub of lukewarm water.  Your body will be positioned in a way that makes it easy to target the kidney   stone.  A flexible tube with holes in it (stent) may be placed in the ureter. This will help keep urine flowing from the kidney if the fragments of the stone have been blocking the ureter.  An X-ray or ultrasound exam will be done to locate your stone.  Shock waves will be aimed at the stone. If you are awake, you may feel a tapping sensation as the shock waves pass through your body. The procedure may vary among health care  providers and hospitals. What happens after the procedure?  You may have an X-ray to see whether the procedure was able to break up the kidney stone and how much of the stone has passed. If large stone fragments remain after treatment, you may need to have a second procedure at a later time.  Your blood pressure, heart rate, breathing rate, and blood oxygen level will be monitored until the medicines you were given have worn off.  You may be given antibiotics or pain medicine as needed.  If a stent was placed in your ureter during surgery, it may stay in place for a few weeks.  You may need strain your urine to collect pieces of the kidney stone for testing.  You will need to drink plenty of water.  Do not drive for 24 hours if you were given a sedative. Summary  Lithotripsy is a treatment that can sometimes help eliminate kidney stones and the pain that they cause.  A form of lithotripsy, also known as extracorporeal shock wave lithotripsy, is a nonsurgical procedure that crushes a kidney stone with shock waves.  Generally, this is a safe procedure. However, problems may occur, including damage to the kidney or other organs, infection, or obstruction of the tube that carries urine from the kidney to the bladder (ureter).  When you go home, you will need to drink plenty of water. You may be asked to strain your urine to collect pieces of the kidney stone for testing. This information is not intended to replace advice given to you by your health care provider. Make sure you discuss any questions you have with your health care provider. Document Revised: 11/07/2018 Document Reviewed: 06/17/2016 Elsevier Patient Education  2020 Else[employer] Inc.    Lithotripsy, Care After This sheet gives you information about how to care for yourself after your procedure. Your health care provider may also give you more specific instructions. If you have problems or questions, contact your health care  provider. What can I expect after the procedure? After the procedure, it is common to have:  Some blood in your urine. This should only last for a few days.  Soreness in your back, sides, or upper abdomen for a few days.  Blotches or bruises on your back where the pressure wave entered the skin.  Pain, discomfort, or nausea when pieces (fragments) of the kidney stone move through the tube that carries urine from the kidney to the bladder (ureter). Stone fragments may pass soon after the procedure, but they may continue to pass for up to 4-8 weeks. ? If you have severe pain or nausea, contact your health care provider. This may be caused by a large stone that was not broken up, and this may mean that you need more treatment.  Some pain or discomfort during urination.  Some pain or discomfort in the lower abdomen or (in men) at the base of the penis. Follow these instructions at home: Medicines  Take over-the-counter and prescription medicines only as told   by your health care provider.  If you were prescribed an antibiotic medicine, take it as told by your health care provider. Do not stop taking the antibiotic even if you start to feel better.  Do not drive for 24 hours if you were given a medicine to help you relax (sedative).  Do not drive or use heavy machinery while taking prescription pain medicine. Eating and drinking      Drink enough water and fluids to keep your urine clear or pale yellow. This helps any remaining pieces of the stone to pass. It can also help prevent new stones from forming.  Eat plenty of fresh fruits and vegetables.  Follow instructions from your health care provider about eating and drinking restrictions. You may be instructed: ? To reduce how much salt (sodium) you eat or drink. Check ingredients and nutrition facts on packaged foods and beverages. ? To reduce how much meat you eat.  Eat the recommended amount of calcium for your age and gender. Ask  your health care provider how much calcium you should have. General instructions  Get plenty of rest.  Most people can resume normal activities 1-2 days after the procedure. Ask your health care provider what activities are safe for you.  Your health care provider may direct you to lie in a certain position (postural drainage) and tap firmly (percuss) over your kidney area to help stone fragments pass. Follow instructions as told by your health care provider.  If directed, strain all urine through the strainer that was provided by your health care provider. ? Keep all fragments for your health care provider to see. Any stones that are found may be sent to a medical lab for examination. The stone may be as small as a grain of salt.  Keep all follow-up visits as told by your health care provider. This is important. Contact a health care provider if:  You have pain that is severe or does not get better with medicine.  You have nausea that is severe or does not go away.  You have blood in your urine longer than your health care provider told you to expect.  You have more blood in your urine.  You have pain during urination that does not go away.  You urinate more frequently than usual and this does not go away.  You develop a rash or any other possible signs of an allergic reaction. Get help right away if:  You have severe pain in your back, sides, or upper abdomen.  You have severe pain while urinating.  Your urine is very dark red.  You have blood in your stool (feces).  You cannot pass any urine at all.  You feel a strong urge to urinate after emptying your bladder.  You have a fever or chills.  You develop shortness of breath, difficulty breathing, or chest pain.  You have severe nausea that leads to persistent vomiting.  You faint. Summary  After this procedure, it is common to have some pain, discomfort, or nausea when pieces (fragments) of the kidney stone move  through the tube that carries urine from the kidney to the bladder (ureter). If this pain or nausea is severe, however, you should contact your health care provider.  Most people can resume normal activities 1-2 days after the procedure. Ask your health care provider what activities are safe for you.  Drink enough water and fluids to keep your urine clear or pale yellow. This helps any remaining pieces of   the stone to pass, and it can help prevent new stones from forming.  If directed, strain your urine and keep all fragments for your health care provider to see. Fragments or stones may be as small as a grain of salt.  Get help right away if you have severe pain in your back, sides, or upper abdomen or have severe pain while urinating. This information is not intended to replace advice given to you by your health care provider. Make sure you discuss any questions you have with your health care provider. Document Revised: 11/07/2018 Document Reviewed: 06/17/2016 Elsevier Patient Education  2020 Elsevier Inc.  

## 2020-06-25 NOTE — Progress Notes (Signed)
06/25/20 11:40 AM   Rebecca Allen [birth year] [id number]  CC: Left flank pain, kidney stone  HPI: I saw Rebecca Allen in urology clinic today for duration of left-sided flank pain and kidney stone.  She is a healthy 54 year old female with no prior episodes of stone disease who has had 1 week of left-sided flank pain and gross hematuria.  She was seen in the ED on 06/19/2020 where CT showed a 6 mm renal pelvis stone just above the UPJ.  Urinalysis was benign, and culture was negative, and she was discharged with medical expulsive therapy.  Her pain is currently moderately controlled with Percocet.  She has not taken any NSAIDs or aspirin in the last 24 hours.  Stone clearly seen at left UPJ on KUB today.  PMH: Past Medical History:  Diagnosis Date   Depression    GERD (gastroesophageal reflux disease)    Hyperlipidemia    Kidney stone    Treadmill stress test negative for angina pectoris 12/18/2010    Surgical History: Past Surgical History:  Procedure Laterality Date   ABDOMINAL HYSTERECTOMY     with 1 ovary removed; remaining over removed a yr later   CHOLECYSTECTOMY     COLONOSCOPY      Family History: Family History  Problem Relation Age of Onset   COPD Mother    Heart attack Father 17   Hypertension Father    Heart disease Father        stent placement    Leukemia Brother    Kidney failure Maternal Grandfather    COPD Paternal Grandmother    Congenital heart disease Sister    Breast cancer Neg Hx     Social History:  reports that she has been smoking cigarettes. She has a 7.50 pack-year smoking history. She has never used smokeless tobacco. She reports current alcohol use of about 2.0 standard drinks of alcohol per week. She reports that she does not use drugs.  Physical Exam: BP 114/76 (BP Location: Left Arm, Patient Position: Sitting, Cuff Size: Normal)    Pulse 78    Ht 5\' 5"  (1.651 m)    Wt 183 lb (83 kg)    BMI 30.45 kg/m     Constitutional:  Alert and oriented, No acute distress. Cardiovascular: No clubbing, cyanosis, or edema. Respiratory: Normal respiratory effort, no increased work of breathing. GI: Abdomen is soft, nontender, nondistended, no abdominal masses GU: Left CVA tenderness  Laboratory Data: Reviewed, see HPI  Pertinent Imaging: I have personally viewed and interpreted the CT and KUB that showed a 6 mm left proximal ureteral stone at the UPJ, 550HU, 10cm SSD, clearly seen on KUB.  No other renal stones.  Assessment & Plan:   54 year old female with left 6 mm proximal ureteral stone and no clinical or laboratory evidence of infection.  We discussed various treatment options for urolithiasis including observation with or without medical expulsive therapy, shockwave lithotripsy (SWL), ureteroscopy and laser lithotripsy with stent placement, and percutaneous nephrolithotomy.  We discussed that management is based on stone size, location, density, patient co-morbidities, and patient preference.   Stones <42mm in size have a >80% spontaneous passage rate. Data surrounding the use of tamsulosin for medical expulsive therapy is controversial, but meta analyses suggests it is most efficacious for distal stones between 5-75mm in size. Possible side effects include dizziness/lightheadedness, and retrograde ejaculation.  SWL has a lower stone free rate in a single procedure, but also a lower complication rate compared to ureteroscopy and avoids  a stent and associated stent related symptoms. Possible complications include renal hematoma, steinstrasse, and need for additional treatment.  Ureteroscopy with laser lithotripsy and stent placement has a higher stone free rate than SWL in a single procedure, however increased complication rate including possible infection, ureteral injury, bleeding, and stent related morbidity. Common stent related symptoms include dysuria, urgency/frequency, and flank pain.  After  an extensive discussion of the risks and benefits of the above treatment options, the patient would like to proceed with left shockwave lithotripsy.  Schedule left shockwave lithotripsy this Thursday  Tegan Burnside Franklin, MD 06/25/2020  [facility] [address], [place], [place] [address] [phone]

## 2020-06-25 NOTE — H&P (View-Only) (Signed)
06/25/20 11:40 AM   Carlyann Jerilynn Mages Streetman 1966/04/01 [id number]  CC: Left flank pain, kidney stone  HPI: I saw Ms Beagley in urology clinic today for duration of left-sided flank pain and kidney stone.  She is a healthy 54 year old female with no prior episodes of stone disease who has had 1 week of left-sided flank pain and gross hematuria.  She was seen in the ED on 06/19/2020 where CT showed a 6 mm renal pelvis stone just above the UPJ.  Urinalysis was benign, and culture was negative, and she was discharged with medical expulsive therapy.  Her pain is currently moderately controlled with Percocet.  She has not taken any NSAIDs or aspirin in the last 24 hours.  Stone clearly seen at left UPJ on KUB today.  PMH: Past Medical History:  Diagnosis Date  . Depression   . GERD (gastroesophageal reflux disease)   . Hyperlipidemia   . Kidney stone   . Treadmill stress test negative for angina pectoris 12/18/2010    Surgical History: Past Surgical History:  Procedure Laterality Date  . ABDOMINAL HYSTERECTOMY     with 1 ovary removed; remaining over removed a yr later  . CHOLECYSTECTOMY    . COLONOSCOPY      Family History: Family History  Problem Relation Age of Onset  . COPD Mother   . Heart attack Father 54  . Hypertension Father   . Heart disease Father        stent placement   . Leukemia Brother   . Kidney failure Maternal Grandfather   . COPD Paternal Grandmother   . Congenital heart disease Sister   . Breast cancer Neg Hx     Social History:  reports that she has been smoking cigarettes. She has a 7.50 pack-year smoking history. She has never used smokeless tobacco. She reports current alcohol use of about 2.0 standard drinks of alcohol per week. She reports that she does not use drugs.  Physical Exam: BP 114/76 (BP Location: Left Arm, Patient Position: Sitting, Cuff Size: Normal)   Pulse 78   Ht 5\' 5"  (1.651 m)   Wt 183 lb (83 kg)   BMI 30.45 kg/m     Constitutional:  Alert and oriented, No acute distress. Cardiovascular: No clubbing, cyanosis, or edema. Respiratory: Normal respiratory effort, no increased work of breathing. GI: Abdomen is soft, nontender, nondistended, no abdominal masses GU: Left CVA tenderness  Laboratory Data: Reviewed, see HPI  Pertinent Imaging: I have personally viewed and interpreted the CT and KUB that showed a 6 mm left proximal ureteral stone at the UPJ, 550HU, 10cm SSD, clearly seen on KUB.  No other renal stones.  Assessment & Plan:   54 year old female with left 6 mm proximal ureteral stone and no clinical or laboratory evidence of infection.  We discussed various treatment options for urolithiasis including observation with or without medical expulsive therapy, shockwave lithotripsy (SWL), ureteroscopy and laser lithotripsy with stent placement, and percutaneous nephrolithotomy.  We discussed that management is based on stone size, location, density, patient co-morbidities, and patient preference.   Stones <30mm in size have a >80% spontaneous passage rate. Data surrounding the use of tamsulosin for medical expulsive therapy is controversial, but meta analyses suggests it is most efficacious for distal stones between 5-33mm in size. Possible side effects include dizziness/lightheadedness, and retrograde ejaculation.  SWL has a lower stone free rate in a single procedure, but also a lower complication rate compared to ureteroscopy and avoids a stent and associated  stent related symptoms. Possible complications include renal hematoma, steinstrasse, and need for additional treatment.  Ureteroscopy with laser lithotripsy and stent placement has a higher stone free rate than SWL in a single procedure, however increased complication rate including possible infection, ureteral injury, bleeding, and stent related morbidity. Common stent related symptoms include dysuria, urgency/frequency, and flank pain.  After  an extensive discussion of the risks and benefits of the above treatment options, the patient would like to proceed with left shockwave lithotripsy.  Schedule left shockwave lithotripsy this Thursday  Sharif Rendell Oberlin, MD 06/25/2020  [facility] [address], [place], [place] [address] [phone]

## 2020-06-26 ENCOUNTER — Other Ambulatory Visit: Payer: Self-pay | Admitting: Urology

## 2020-06-26 ENCOUNTER — Encounter: Payer: Self-pay | Admitting: Urology

## 2020-06-26 DIAGNOSIS — N201 Calculus of ureter: Secondary | ICD-10-CM

## 2020-06-26 MED ORDER — DIPHENHYDRAMINE HCL 25 MG PO CAPS: 25.0000 mg | ORAL_CAPSULE | ORAL | Status: AC

## 2020-06-26 MED ORDER — ONDANSETRON HCL 4 MG/2ML IJ SOLN: 4.0000 mg | Freq: Once | INTRAMUSCULAR | Status: AC | PRN

## 2020-06-26 MED ORDER — DIAZEPAM 5 MG PO TABS: 10.0000 mg | ORAL_TABLET | ORAL | Status: AC

## 2020-06-26 MED ORDER — CIPROFLOXACIN HCL 500 MG PO TABS: 500.0000 mg | ORAL_TABLET | ORAL | Status: AC

## 2020-06-26 MED ORDER — SODIUM CHLORIDE 0.9 % IV SOLN: INTRAVENOUS | Status: DC

## 2020-06-27 ENCOUNTER — Ambulatory Visit: Payer: 59

## 2020-06-27 ENCOUNTER — Telehealth: Payer: Self-pay | Admitting: Urology

## 2020-06-27 ENCOUNTER — Other Ambulatory Visit: Payer: Self-pay | Admitting: *Deleted

## 2020-06-27 ENCOUNTER — Ambulatory Visit
Admission: RE | Admit: 2020-06-27 | Discharge: 2020-06-27 | Disposition: A | Discharge status: Home / Self Care | Payer: 59 | Attending: Urology | Admitting: Urology

## 2020-06-27 ENCOUNTER — Encounter
Admission: RE | Disposition: A | Discharge status: Home / Self Care | Payer: Self-pay | Source: Home / Self Care | Attending: Urology

## 2020-06-27 ENCOUNTER — Other Ambulatory Visit: Payer: Self-pay

## 2020-06-27 ENCOUNTER — Encounter: Payer: Self-pay | Admitting: Urology

## 2020-06-27 DIAGNOSIS — N200 Calculus of kidney: Secondary | ICD-10-CM

## 2020-06-27 DIAGNOSIS — N201 Calculus of ureter: Secondary | ICD-10-CM

## 2020-06-27 DIAGNOSIS — N135 Crossing vessel and stricture of ureter without hydronephrosis: Secondary | ICD-10-CM

## 2020-06-27 HISTORY — PX: EXTRACORPOREAL SHOCK WAVE LITHOTRIPSY: SHX1557

## 2020-06-27 HISTORY — DX: Pneumonia, unspecified organism: J18.9

## 2020-06-27 HISTORY — DX: Personal history of urinary calculi: Z87.442

## 2020-06-27 SURGERY — LITHOTRIPSY, ESWL
Anesthesia: Moderate Sedation | Laterality: Left

## 2020-06-27 MED ORDER — KETOROLAC TROMETHAMINE 30 MG/ML IJ SOLN: 30.0000 mg | Freq: Once | INTRAMUSCULAR | Status: AC

## 2020-06-27 MED ORDER — DIAZEPAM 5 MG PO TABS
ORAL_TABLET | ORAL | Status: AC
  Administered 2020-06-27: 10 mg via ORAL
  Filled 2020-06-27: qty 2

## 2020-06-27 MED ORDER — HYDROMORPHONE HCL 1 MG/ML IJ SOLN: 1.0000 mg | Freq: Once | INTRAMUSCULAR | Status: AC

## 2020-06-27 MED ORDER — KETOROLAC TROMETHAMINE 30 MG/ML IJ SOLN
INTRAMUSCULAR | Status: AC
  Administered 2020-06-27: 30 mg via INTRAVENOUS
  Filled 2020-06-27: qty 1

## 2020-06-27 MED ORDER — OXYCODONE-ACETAMINOPHEN 5-325 MG PO TABS
1.0000 | ORAL_TABLET | ORAL | 0 refills | Status: DC | PRN
Start: 2020-06-27 — End: 2020-07-11

## 2020-06-27 MED ORDER — CIPROFLOXACIN HCL 500 MG PO TABS
ORAL_TABLET | ORAL | Status: AC
  Administered 2020-06-27: 500 mg via ORAL
  Filled 2020-06-27: qty 1

## 2020-06-27 MED ORDER — OXYCODONE-ACETAMINOPHEN 5-325 MG PO TABS: 1.0000 | ORAL_TABLET | Freq: Once | ORAL | Status: AC

## 2020-06-27 MED ORDER — OXYCODONE-ACETAMINOPHEN 5-325 MG PO TABS
1.0000 | ORAL_TABLET | ORAL | Status: DC | PRN
  Administered 2020-06-27: 1 via ORAL

## 2020-06-27 MED ORDER — DIPHENHYDRAMINE HCL 25 MG PO CAPS
ORAL_CAPSULE | ORAL | Status: AC
  Administered 2020-06-27: 25 mg via ORAL
  Filled 2020-06-27: qty 1

## 2020-06-27 MED ORDER — ONDANSETRON HCL 4 MG/2ML IJ SOLN
INTRAMUSCULAR | Status: AC
  Administered 2020-06-27: 4 mg via INTRAVENOUS
  Filled 2020-06-27: qty 2

## 2020-06-27 MED ORDER — MORPHINE SULFATE (PF) 2 MG/ML IV SOLN
INTRAVENOUS | Status: AC
  Filled 2020-06-27: qty 1

## 2020-06-27 MED ORDER — OXYCODONE-ACETAMINOPHEN 5-325 MG PO TABS
ORAL_TABLET | ORAL | Status: AC
  Filled 2020-06-27: qty 1

## 2020-06-27 MED ORDER — MORPHINE SULFATE (PF) 2 MG/ML IV SOLN: 1.0000 mg | Freq: Once | INTRAVENOUS | Status: DC

## 2020-06-27 MED ORDER — HYDROMORPHONE HCL 1 MG/ML IJ SOLN
INTRAMUSCULAR | Status: AC
  Administered 2020-06-27: 1 mg via INTRAVENOUS
  Filled 2020-06-27: qty 1

## 2020-06-27 MED ORDER — OXYCODONE-ACETAMINOPHEN 5-325 MG PO TABS
ORAL_TABLET | ORAL | Status: AC
  Administered 2020-06-27: 1 via ORAL
  Filled 2020-06-27: qty 1

## 2020-06-27 NOTE — Telephone Encounter (Signed)
-----   Message from Abbie Sons, MD sent at 06/27/2020  9:03 AM EST ----- Regarding: f/u Sched post litho PA f/u 1-2 weeks with kub

## 2020-06-27 NOTE — Telephone Encounter (Signed)
App made Pt aware

## 2020-06-27 NOTE — Interval H&P Note (Signed)
History and Physical Interval Note: CV:RRR Lungs: clear  06/27/2020 7:51 AM  Rebecca Allen  has presented today for surgery, with the diagnosis of Kidney stone.  The various methods of treatment have been discussed with the patient and family. After consideration of risks, benefits and other options for treatment, the patient has consented to  Procedure(s): EXTRACORPOREAL SHOCK WAVE LITHOTRIPSY (ESWL) (Left) as a surgical intervention.  The patient's history has been reviewed, patient examined, no change in status, stable for surgery.  I have reviewed the patient's chart and labs.  Questions were answered to the patient's satisfaction.     [place]

## 2020-06-27 NOTE — Discharge Instructions (Signed)
As per [facility] instructions  Continue tamsulosin  Refill oxycodone sent to pharmacy  Strain urine   AMBULATORY SURGERY  DISCHARGE INSTRUCTIONS   1) The drugs that you were given will stay in your system until tomorrow so for the next 24 hours you should not:  A) Drive an automobile B) Make any legal decisions C) Drink any alcoholic beverage   2) You may resume regular meals tomorrow.  Today it is better to start with liquids and gradually work up to solid foods.  You may eat anything you prefer, but it is better to start with liquids, then soup and crackers, and gradually work up to solid foods.   3) Please notify your doctor immediately if you have any unusual bleeding, trouble breathing, redness and pain at the surgery site, drainage, fever, or pain not relieved by medication.    4) Additional Instructions:        Please contact your physician with any problems or Same Day Surgery at [phone], Monday through Friday 6 am to 4 pm, or  at [facility] number at [phone].

## 2020-06-28 ENCOUNTER — Emergency Department: Payer: 59

## 2020-06-28 ENCOUNTER — Encounter: Payer: Self-pay | Admitting: Emergency Medicine

## 2020-06-28 ENCOUNTER — Emergency Department
Admission: EM | Admit: 2020-06-28 | Discharge: 2020-06-28 | Disposition: A | Discharge status: Home / Self Care | Payer: 59 | Attending: Emergency Medicine | Admitting: Emergency Medicine

## 2020-06-28 ENCOUNTER — Other Ambulatory Visit: Payer: Self-pay

## 2020-06-28 DIAGNOSIS — Z7982 Long term (current) use of aspirin: Secondary | ICD-10-CM | POA: Diagnosis not present

## 2020-06-28 DIAGNOSIS — F17210 Nicotine dependence, cigarettes, uncomplicated: Secondary | ICD-10-CM | POA: Insufficient documentation

## 2020-06-28 DIAGNOSIS — Z9889 Other specified postprocedural states: Secondary | ICD-10-CM | POA: Diagnosis not present

## 2020-06-28 DIAGNOSIS — G8918 Other acute postprocedural pain: Secondary | ICD-10-CM | POA: Insufficient documentation

## 2020-06-28 DIAGNOSIS — R319 Hematuria, unspecified: Secondary | ICD-10-CM | POA: Diagnosis not present

## 2020-06-28 DIAGNOSIS — Z87442 Personal history of urinary calculi: Secondary | ICD-10-CM | POA: Diagnosis not present

## 2020-06-28 DIAGNOSIS — R109 Unspecified abdominal pain: Secondary | ICD-10-CM | POA: Insufficient documentation

## 2020-06-28 DIAGNOSIS — R110 Nausea: Secondary | ICD-10-CM | POA: Diagnosis not present

## 2020-06-28 LAB — CBC WITH DIFFERENTIAL/PLATELET
Abs Immature Granulocytes: 0.06 10*3/uL (ref 0.00–0.07)
Basophils Absolute: 0 10*3/uL (ref 0.0–0.1)
Basophils Relative: 0 %
Eosinophils Absolute: 0.1 10*3/uL (ref 0.0–0.5)
Eosinophils Relative: 1 %
HCT: 42.3 % (ref 36.0–46.0)
Hemoglobin: 14.4 g/dL (ref 12.0–15.0)
Immature Granulocytes: 1 %
Lymphocytes Relative: 12 %
Lymphs Abs: 1.4 10*3/uL (ref 0.7–4.0)
MCH: 31.6 pg (ref 26.0–34.0)
MCHC: 34 g/dL (ref 30.0–36.0)
MCV: 93 fL (ref 80.0–100.0)
Monocytes Absolute: 0.9 10*3/uL (ref 0.1–1.0)
Monocytes Relative: 8 %
Neutro Abs: 8.9 10*3/uL — ABNORMAL HIGH (ref 1.7–7.7)
Neutrophils Relative %: 78 %
Platelets: 247 10*3/uL (ref 150–400)
RBC: 4.55 MIL/uL (ref 3.87–5.11)
RDW: 13 % (ref 11.5–15.5)
WBC: 11.3 10*3/uL — ABNORMAL HIGH (ref 4.0–10.5)
nRBC: 0 % (ref 0.0–0.2)

## 2020-06-28 LAB — BASIC METABOLIC PANEL
Anion gap: 10 (ref 5–15)
BUN: 14 mg/dL (ref 6–20)
CO2: 22 mmol/L (ref 22–32)
Calcium: 9.5 mg/dL (ref 8.9–10.3)
Chloride: 104 mmol/L (ref 98–111)
Creatinine, Ser: 1.16 mg/dL — ABNORMAL HIGH (ref 0.44–1.00)
GFR, Estimated: 57 mL/min — ABNORMAL LOW (ref >60)
Glucose, Bld: 109 mg/dL — ABNORMAL HIGH (ref 70–99)
Potassium: 4.2 mmol/L (ref 3.5–5.1)
Sodium: 136 mmol/L (ref 135–145)

## 2020-06-28 MED ORDER — SODIUM CHLORIDE 0.9 % IV SOLN
1.5000 mg/kg | Freq: Once | INTRAVENOUS | Status: AC
  Administered 2020-06-28: 122 mg via INTRAVENOUS
  Filled 2020-06-28: qty 6.1

## 2020-06-28 MED ORDER — HYDROMORPHONE HCL 1 MG/ML IJ SOLN
1.0000 mg | Freq: Once | INTRAMUSCULAR | Status: AC
  Administered 2020-06-28: 1 mg via INTRAVENOUS
  Filled 2020-06-28: qty 1

## 2020-06-28 MED ORDER — MORPHINE SULFATE (PF) 4 MG/ML IV SOLN
4.0000 mg | Freq: Once | INTRAVENOUS | Status: DC
  Filled 2020-06-28: qty 1

## 2020-06-28 MED ORDER — ONDANSETRON HCL 4 MG/2ML IJ SOLN
4.0000 mg | INTRAMUSCULAR | Status: AC
  Administered 2020-06-28: 4 mg via INTRAVENOUS
  Filled 2020-06-28: qty 2

## 2020-06-28 MED ORDER — OXYCODONE-ACETAMINOPHEN 5-325 MG PO TABS
2.0000 | ORAL_TABLET | Freq: Once | ORAL | Status: AC
  Administered 2020-06-28: 2 via ORAL
  Filled 2020-06-28: qty 2

## 2020-06-28 NOTE — Progress Notes (Signed)
Patient went to ED due to pain becoming intolerable with no fever or chills nor excessive bleeding. She was seen and now is getting ready to be discharged.

## 2020-06-28 NOTE — ED Triage Notes (Signed)
Pt arrived via POV with reports of post-op pain after lithotripsy yesterday. Pt states pain started around 1130pm last night, pt states she also has hematuria present.  Pt took percocet about 45 mins prior to arrival, pt states the pain radiates from L flank to abdomen.

## 2020-06-28 NOTE — ED Provider Notes (Signed)
Patient received in signout from Dr. Karma Greaser.  CT imaging with noted small subpectoral hematoma and associated known stones both 3 mm.  Also associated obstructive changes that she would be expected.  She not febrile denies any dysuria.  Her pain is controlled.  Does appear appropriate for outpatient follow-up.  Discussed strict return precautions.   Merlyn Lot, MD 06/28/20 [phone]

## 2020-06-28 NOTE — ED Notes (Signed)
Patient arriving in room from triage at this time.

## 2020-06-28 NOTE — Discharge Instructions (Signed)
Please follow up with Dr. Diamantina Providence or one of his colleagues regarding your on-going pain.  Continue to take the medications prescribed by him.  Return to the emergency department if you develop new or worsening symptoms that concern you.

## 2020-06-28 NOTE — ED Provider Notes (Signed)
[facility] Emergency Department Provider Note  ____________________________________________   First MD Initiated Contact with Patient 06/28/20 0505     (approximate)  I have reviewed the triage vital signs and the nursing notes.   HISTORY  Chief Complaint Flank Pain and Post-op Problem    HPI Rebecca Allen is a 54 y.o. female who is recently postop for lithotripsy by Dr. Diamantina Providence who presents for evaluation of persistent and worsening left-sided flank pain that is sharp and radiating down into the left side of her groin.  She is also having hematuria.  She has nausea associated with the pain.  It feels much worse than the renal colic she was experiencing which led to the initial diagnosis of ureteral stone.  Nothing in particular makes it better or worse.  She has not been vomiting.  She denies fever/chills, sore throat, chest pain, shortness of breath.         Past Medical History:  Diagnosis Date  . Depression   . GERD (gastroesophageal reflux disease)   . History of kidney stones   . Hyperlipidemia   . Kidney stone   . Pneumonia   . Treadmill stress test negative for angina pectoris 12/18/2010    Patient Active Problem List   Diagnosis Date Noted  . Hematuria 06/18/2020  . Rash 05/13/2020  . Hematoma 05/13/2020  . Upper respiratory infection 11/29/2019  . Post-COVID chronic cough 11/15/2018  . Allergic sinusitis 11/11/2018  . Chronic insomnia 10/18/2018  . Symptomatic mammary hypertrophy 09/30/2018  . Current moderate episode of major depressive disorder without prior episode ([place]) 09/06/2018  . Gastritis 01/10/2018  . Palpitations 05/25/2017  . Chronic cervical radiculopathy 07/07/2016  . Lymphadenopathy 02/07/2016  . Post-traumatic stress 10/05/2014  . Panic attacks 07/25/2013  . Chronic low back pain with left-sided sciatica 05/30/2010  . Generalized anxiety disorder 05/16/2009  . PERS HX TOBACCO USE PRESENTING HAZARDS  HEALTH 05/07/2008  . Mixed hyperlipidemia 03/28/2008  . COMMON MIGRAINE 03/28/2008  . GERD 03/28/2008  . COSTOCHONDRITIS, RECURRENT 03/28/2008  . Atypical chest pain 03/28/2008    Past Surgical History:  Procedure Laterality Date  . ABDOMINAL HYSTERECTOMY  2001   with 1 ovary removed; remaining over removed a yr later  . CHOLECYSTECTOMY  2002  . COLONOSCOPY    . UPPER GI ENDOSCOPY  2019    Prior to Admission medications   Medication Sig Start Date End Date Taking? Authorizing Provider  ALPRAZolam Duanne Moron) 0.5 MG tablet Take 0.5-1 tablets (0.25-0.5 mg total) by mouth daily as needed for anxiety. 04/02/20   Bedsole, Amy E, MD  Ascorbic Acid (VITAMIN C WITH ROSE HIPS) 500 MG tablet Take 500 mg by mouth daily.    [provider]  aspirin 81 MG chewable tablet Chew 81 mg by mouth daily.    [provider]  calcium carbonate (TUMS - DOSED IN MG ELEMENTAL CALCIUM) 500 MG chewable tablet Chew 6 tablets by mouth daily as needed for indigestion or heartburn.    [provider]  escitalopram (LEXAPRO) 5 MG tablet Take 0.5 tablets (2.5 mg total) by mouth daily. Patient not taking: Reported on 06/27/2020 04/02/20   Jinny Sanders, MD  estradiol (VIVELLE-DOT) 0.05 MG/24HR patch PLACE 1 PATCH TRANS-DERMALLY TWICE A WEEK. REMOVE OLD PATCH BEFORE APPLYING NEW PATCH. 89/2/11   Copland, Elmo Putt B, PA-C  ondansetron (ZOFRAN ODT) 4 MG disintegrating tablet Take 1 tablet (4 mg total) by mouth every 8 (eight) hours as needed for nausea or vomiting. 06/19/20  Harvest Dark, MD  ondansetron (ZOFRAN) 4 MG tablet Take 1 tablet (4 mg total) by mouth every 8 (eight) hours as needed for nausea or vomiting. 06/19/20   Bedsole, Amy E, MD  oxyCODONE-acetaminophen (PERCOCET) 5-325 MG tablet Take 1 tablet by mouth every 4 (four) hours as needed for severe pain. 06/27/20   Stoioff, Ronda Fairly, MD  pantoprazole (PROTONIX) 40 MG tablet TAKE 1 TABLET BY MOUTH EVERY DAY 05/04/20   Copland, Frederico Hamman, MD    tamsulosin (FLOMAX) 0.4 MG CAPS capsule Take 1 capsule (0.4 mg total) by mouth daily. 06/19/20   Harvest Dark, MD  zinc gluconate 50 MG tablet Take 50 mg by mouth daily.    [provider]    Allergies Metoclopramide hcl, Morphine and related, and Venlafaxine  Family History  Problem Relation Age of Onset  . COPD Mother   . Heart attack Father 18  . Hypertension Father   . Heart disease Father        stent placement   . Leukemia Brother   . Kidney failure Maternal Grandfather   . COPD Paternal Grandmother   . Congenital heart disease Sister   . Breast cancer Neg Hx     Social History Social History   Tobacco Use  . Smoking status: Current Every Day Smoker    Packs/day: 0.25    Years: 30.00    Pack years: 7.50    Types: Cigarettes  . Smokeless tobacco: Never Used  Vaping Use  . Vaping Use: Never used  Substance Use Topics  . Alcohol use: Yes    Alcohol/week: 2.0 standard drinks    Types: 1 Glasses of wine, 1 Cans of beer per week    Comment: beer ocassionally  . Drug use: No    Review of Systems Constitutional: No fever/chills Eyes: No visual changes. ENT: No sore throat. Cardiovascular: Denies chest pain. Respiratory: Denies shortness of breath. Gastrointestinal: Severe left flank pain radiating to the left groin and left side of the abdomen associated with nausea but no vomiting. Genitourinary: Negative for dysuria. Musculoskeletal: Negative for neck pain.  Flank pain as described above. Integumentary: Negative for rash. Neurological: Negative for headaches, focal weakness or numbness.   ____________________________________________   PHYSICAL EXAM:  VITAL SIGNS: ED Triage Vitals  Enc Vitals Group     BP 06/28/20 0445 (!) 105/38     Pulse Rate 06/28/20 0445 94     Resp 06/28/20 0445 (!) 24     Temp 06/28/20 0447 98.8 F (37.1 C)     Temp Source 06/28/20 0445 Oral     SpO2 06/28/20 0445 96 %     Weight 06/28/20 0446 81.6 kg (180 lb)      Height 06/28/20 0446 1.651 m (5\' 5" )     Head Circumference --      Peak Flow --      Pain Score 06/28/20 0445 9     Pain Loc --      Pain Edu? --      Excl. in [place]? --     Constitutional: Alert and oriented.  Patient appears very uncomfortable. Eyes: Conjunctivae are normal.  Head: Atraumatic. Nose: No congestion/rhinnorhea. Mouth/Throat: Patient is wearing a mask. Neck: No stridor.  No meningeal signs.   Cardiovascular: Normal rate, regular rhythm. Good peripheral circulation. Grossly normal heart sounds. Respiratory: Normal respiratory effort.  No retractions. Gastrointestinal: Soft and nontender.  Nondistended. Musculoskeletal: Severe left CVA tenderness to percussion. Neurologic:  Normal speech and language. No gross focal  neurologic deficits are appreciated.  Skin:  Skin is warm, dry and intact.  Small amount of bruising on the left flank which was reported to be normal after lithotripsy. Psychiatric: Mood and affect are normal. Speech and behavior are normal.  ____________________________________________   LABS (all labs ordered are listed, but only abnormal results are displayed)  Labs Reviewed  BASIC METABOLIC PANEL - Abnormal; Notable for the following components:      Result Value   Glucose, Bld 109 (*)    Creatinine, Ser 1.16 (*)    GFR, Estimated 57 (*)    All other components within normal limits  CBC WITH DIFFERENTIAL/PLATELET - Abnormal; Notable for the following components:   WBC 11.3 (*)    Neutro Abs 8.9 (*)    All other components within normal limits   ____________________________________________  EKG  No indication for emergent EKG tonight.  I reviewed a prior EKG from about a month ago which was reassuring including her intervals with no worrisome abnormalities. ____________________________________________  RADIOLOGY CT renal stone protocol pending at time of transfer of  care.   ____________________________________________   PROCEDURES   Procedure(s) performed (including Critical Care):  Procedures   ____________________________________________   INITIAL IMPRESSION / MDM / [place] / ED COURSE  As part of my medical decision making, I reviewed the following data within the [place] notes reviewed and incorporated, Labs reviewed , Old chart reviewed, Patient signed out to Dr. Quentin Cornwall, reviewed notes from prior ED visits and  Controlled Substance Database   Differential diagnosis includes, but is not limited to, persistent renal/ureteral colic in the setting of recent lithotripsy, postoperative complication such as ureteral perforation or rupture, acute infection, less likely renal infarction.  I suspect the patient is having "normal" ureteral colic after lithotripsy because she said that she is passing what seems to be sand, so I suspect the treatment was appropriate and successful but she is still experiencing pain while she passes the remains of the stone.  Given her discomfort I have ordered morphine 4 mg IV, Zofran 4 mg IV, and lidocaine 1.5 mg/kg.  The patient is on the cardiac monitor to evaluate for evidence of arrhythmia and/or significant heart rate changes.  Once she is more comfortable we will reassess the need for imaging.  Lab work is pending as well.      Clinical Course as of Jun 28 737  Ludwig Clarks Jun 28, 2020  0722 Transferring ED care to Dr. Quentin Cornwall to follow up on CT scan and reassess patient's pain.  If adequately controlled, patient should be appropriate for discharge and outpatient urology follow up.   [CF]    Clinical Course User Index [CF] Hinda Kehr, MD     ____________________________________________  FINAL CLINICAL IMPRESSION(S) / ED DIAGNOSES  Final diagnoses:  Left flank pain  Post-operative pain     MEDICATIONS GIVEN DURING THIS VISIT:  Medications   oxyCODONE-acetaminophen (PERCOCET/ROXICET) 5-325 MG per tablet 2 tablet (has no administration in time range)  ondansetron (ZOFRAN) injection 4 mg (4 mg Intravenous Given 06/28/20 0612)  lidocaine (XYLOCAINE) 122 mg in sodium chloride 0.9 % 100 mL IVPB (0 mg/kg  81.6 kg Intravenous Stopped 06/28/20 0651)  HYDROmorphone (DILAUDID) injection 1 mg (1 mg Intravenous Given 06/28/20 0622)     ED Discharge Orders    None      *Please note:  Rebecca Allen was evaluated in Emergency Department on 06/28/2020 for the symptoms described in the history of present  illness. She was evaluated in the context of the global COVID-19 pandemic, which necessitated consideration that the patient might be at risk for infection with the SARS-CoV-2 virus that causes COVID-19. Institutional protocols and algorithms that pertain to the evaluation of patients at risk for COVID-19 are in a state of rapid change based on information released by regulatory bodies including the CDC and federal and state organizations. These policies and algorithms were followed during the patient's care in the ED.  Some ED evaluations and interventions may be delayed as a result of limited staffing during and after the pandemic.*  Note:  This document was prepared using Dragon voice recognition software and may include unintentional dictation errors.   Hinda Kehr, MD 06/28/20 [phone]

## 2020-07-11 ENCOUNTER — Encounter: Payer: Self-pay | Admitting: Physician Assistant

## 2020-07-11 ENCOUNTER — Ambulatory Visit
Admission: RE | Admit: 2020-07-11 | Discharge: 2020-07-11 | Disposition: A | Discharge status: Home / Self Care | Payer: 59 | Attending: Urology | Admitting: Urology

## 2020-07-11 ENCOUNTER — Ambulatory Visit (INDEPENDENT_AMBULATORY_CARE_PROVIDER_SITE_OTHER): Payer: 59 | Admitting: Physician Assistant

## 2020-07-11 ENCOUNTER — Other Ambulatory Visit: Payer: Self-pay

## 2020-07-11 ENCOUNTER — Ambulatory Visit
Admission: RE | Admit: 2020-07-11 | Discharge: 2020-07-11 | Disposition: A | Discharge status: Home / Self Care | Payer: 59 | Source: Ambulatory Visit | Attending: Urology | Admitting: Urology

## 2020-07-11 VITALS — BP 100/65 | HR 83 | Ht 65.0 in | Wt 178.0 lb

## 2020-07-11 DIAGNOSIS — N200 Calculus of kidney: Secondary | ICD-10-CM | POA: Diagnosis present

## 2020-07-11 DIAGNOSIS — N201 Calculus of ureter: Secondary | ICD-10-CM

## 2020-07-11 DIAGNOSIS — S37012A Minor contusion of left kidney, initial encounter: Secondary | ICD-10-CM | POA: Diagnosis not present

## 2020-07-11 NOTE — Progress Notes (Signed)
07/11/2020 10:12 AM   Rebecca Allen [birth year] [id number]  CC: Chief Complaint  Patient presents with  . Nephrolithiasis    2wk w/KUB   HPI: Rebecca Allen is a 54 y.o. female s/p ESWL with Dr. Bernardo Heater on 06/27/2020 for management of a 6 mm left UPJ stone. Intraoperative findings notable for smudging of the stone.  She presented to the ED the next day with reports of intolerable pain and gross hematuria without fever or chills. Repeat CT stone study revealed slight distal migration with stone fragmentation, 2 3 mm stones appreciated in the proximal left ureter. Additionally, she was noted to have a small left subcapsular hemorrhage at the left lower pole.  Today she reports significant improvement in her pain.  She has passed multiple stone fragments and brings them with her for analysis today.  She is still having occasional nausea and intermittent sharp flank pains that she is tolerating well.  She has completed prescribed Flomax.  KUB today with interval clearance of the left UPJ stone.  In-office UA today pan negative; urine microscopy with >10 epithelial cells and many bacteria.  PMH: Past Medical History:  Diagnosis Date  . Depression   . GERD (gastroesophageal reflux disease)   . History of kidney stones   . Hyperlipidemia   . Kidney stone   . Pneumonia   . Treadmill stress test negative for angina pectoris 12/18/2010    Surgical History: Past Surgical History:  Procedure Laterality Date  . ABDOMINAL HYSTERECTOMY  2001   with 1 ovary removed; remaining over removed a yr later  . CHOLECYSTECTOMY  2002  . COLONOSCOPY    . EXTRACORPOREAL SHOCK WAVE LITHOTRIPSY Left 06/27/2020   Procedure: EXTRACORPOREAL SHOCK WAVE LITHOTRIPSY (ESWL);  Surgeon: Abbie Sons, MD;  Location: [facility] ORS;  Service: Urology;  Laterality: Left;  . UPPER GI ENDOSCOPY  2019    Home Medications:  Allergies as of 07/11/2020      Reactions   Metoclopramide Hcl    REACTION:  Nervousness   Morphine And Related Nausea Only   Venlafaxine Other (See Comments)   Hot flashes, clammy and nausea      Medication List       Accurate as of July 11, 2020 10:12 AM. If you have any questions, ask your nurse or doctor.        STOP taking these medications   escitalopram 5 MG tablet Commonly known as: Lexapro Stopped by: Debroah Loop, PA-C   ondansetron 4 MG disintegrating tablet Commonly known as: Zofran ODT Stopped by: Debroah Loop, PA-C   oxyCODONE-acetaminophen 5-325 MG tablet Commonly known as: Percocet Stopped by: Debroah Loop, PA-C   tamsulosin 0.4 MG Caps capsule Commonly known as: FLOMAX Stopped by: Debroah Loop, PA-C     TAKE these medications   ALPRAZolam 0.5 MG tablet Commonly known as: XANAX Take 0.5-1 tablets (0.25-0.5 mg total) by mouth daily as needed for anxiety.   aspirin 81 MG chewable tablet Chew 81 mg by mouth daily.   calcium carbonate 500 MG chewable tablet Commonly known as: TUMS - dosed in mg elemental calcium Chew 6 tablets by mouth daily as needed for indigestion or heartburn.   estradiol 0.05 MG/24HR patch Commonly known as: VIVELLE-DOT PLACE 1 PATCH TRANS-DERMALLY TWICE A WEEK. REMOVE OLD PATCH BEFORE APPLYING NEW PATCH.   ondansetron 4 MG tablet Commonly known as: Zofran Take 1 tablet (4 mg total) by mouth every 8 (eight) hours as needed for nausea or vomiting.   pantoprazole  40 MG tablet Commonly known as: PROTONIX TAKE 1 TABLET BY MOUTH EVERY DAY   vitamin C with rose hips 500 MG tablet Take 500 mg by mouth daily.   Vitamin D3 25 MCG (1000 UT) Caps Take by mouth.   zinc gluconate 50 MG tablet Take 50 mg by mouth daily.       Allergies:  Allergies  Allergen Reactions  . Metoclopramide Hcl     REACTION: Nervousness  . Morphine And Related Nausea Only  . Venlafaxine Other (See Comments)    Hot flashes, clammy and nausea    Family History: Family History    Problem Relation Age of Onset  . COPD Mother   . Heart attack Father 74  . Hypertension Father   . Heart disease Father        stent placement   . Leukemia Brother   . Kidney failure Maternal Grandfather   . COPD Paternal Grandmother   . Congenital heart disease Sister   . Breast cancer Neg Hx     Social History:   reports that she has been smoking cigarettes. She has a 7.50 pack-year smoking history. She has never used smokeless tobacco. She reports current alcohol use of about 2.0 standard drinks of alcohol per week. She reports that she does not use drugs.  Physical Exam: BP 100/65   Pulse 83   Ht 5\' 5"  (1.651 m)   Wt 178 lb (80.7 kg)   BMI 29.62 kg/m   Constitutional:  Alert and oriented, no acute distress, nontoxic appearing HEENT: [place], AT Cardiovascular: No clubbing, cyanosis, or edema Respiratory: Normal respiratory effort, no increased work of breathing Skin: No rashes, bruises or suspicious lesions Neurologic: Grossly intact, no focal deficits, moving all 4 extremities Psychiatric: Normal mood and affect  Laboratory Data: Results for orders placed or performed in visit on 07/11/20  Microscopic Examination   Urine  Result Value Ref Range   WBC, UA 0-5 0 - 5 /hpf   RBC 0-2 0 - 2 /hpf   Epithelial Cells (non renal) >10 (A) 0 - 10 /hpf   Bacteria, UA Many (A) None seen/Few  Urinalysis, Complete  Result Value Ref Range   Specific Gravity, UA >1.030 (H) 1.005 - 1.030   pH, UA 5.0 5.0 - 7.5   Color, UA Yellow Yellow   Appearance Ur Cloudy (A) Clear   Leukocytes,UA Negative Negative   Protein,UA Negative Negative/Trace   Glucose, UA Negative Negative   Ketones, UA Negative Negative   RBC, UA Negative Negative   Bilirubin, UA Negative Negative   Urobilinogen, Ur 0.2 0.2 - 1.0 mg/dL   Nitrite, UA Negative Negative   Microscopic Examination See below:   CBC  Result Value Ref Range   WBC 6.6 3.4 - 10.8 x10E3/uL   RBC 4.58 3.77 - 5.28 x10E6/uL   Hemoglobin 14.3  11.1 - 15.9 g/dL   Hematocrit 42.3 34.0 - 46.6 %   MCV 92 79 - 97 fL   MCH 31.2 26.6 - 33.0 pg   MCHC 33.8 31 - 35 g/dL   RDW 12.9 11.7 - 15.4 %   Platelets 311 150 - 450 x10E3/uL   Pertinent Imaging: KUB, 07/11/2020: CLINICAL DATA:  Nephrolithiasis.  Recent lithotripsy  EXAM: ABDOMEN - 1 VIEW  COMPARISON:  June 27, 2020 abdominal radiograph and CT abdomen and pelvis June 28, 2020  FINDINGS: There are stable small pelvic calcifications, likely phleboliths. Previously noted left ureteral calculus not appreciable. No new calcifications evident. Moderate stool noted  in colon. No bowel dilatation or air-fluid level to suggest bowel obstruction. No free air. Surgical clips in right upper quadrant. Lung bases clear.  IMPRESSION: Apparent phleboliths in the pelvis. No other abnormal calcifications evident. No renal or ureteral calculus appreciable by radiography.  No bowel obstruction or free air.  Lung bases clear.   Electronically Signed   By: Lowella Grip III M.D.   On: 07/11/2020 14:36  I personally reviewed the images referenced above and note interval clearance of the left UPJ stone.  Assessment & Plan:   1. Left ureteral stone Patient has passed multiple fragments, no pyuria or microscopic hematuria on UA today consistent with interval stone passage.  We will send fragments for analysis today and discuss stone prevention recommendations per her results.  She does continue to have occasional twinges of left flank pain and nausea.  Differential includes small retained fragment versus #2 below. - Urinalysis, Complete - Calculi, with Photograph (to Clinical Lab)  2. Hematoma of left kidney, initial encounter VSS today.  Will obtain CBC and plan for follow-up renal ultrasound in 6 weeks to monitor.  Counseled patient to follow-up in clinic if she develops fever, chills, nausea, vomiting, or uncontrollable pain.  She expressed understanding. - US RENAL;  Future - CBC   Return in about 6 weeks (around 08/22/2020) for Stone f/u with renal US prior.  Debroah Loop, PA-C  [facility] [address], [place], [place] [address] [phone]

## 2020-07-12 LAB — CBC
Hematocrit: 42.3 % (ref 34.0–46.6)
Hemoglobin: 14.3 g/dL (ref 11.1–15.9)
MCH: 31.2 pg (ref 26.6–33.0)
MCHC: 33.8 g/dL (ref 31.5–35.7)
MCV: 92 fL (ref 79–97)
Platelets: 311 10*3/uL (ref 150–450)
RBC: 4.58 x10E6/uL (ref 3.77–5.28)
RDW: 12.9 % (ref 11.7–15.4)
WBC: 6.6 10*3/uL (ref 3.4–10.8)

## 2020-07-12 LAB — URINALYSIS, COMPLETE
Bilirubin, UA: NEGATIVE
Glucose, UA: NEGATIVE
Ketones, UA: NEGATIVE
Leukocytes,UA: NEGATIVE
Nitrite, UA: NEGATIVE
Protein,UA: NEGATIVE
RBC, UA: NEGATIVE
Specific Gravity, UA: >1.03 — ABNORMAL HIGH (ref 1.005–1.030)
Urobilinogen, Ur: 0.2 mg/dL (ref 0.2–1.0)
pH, UA: 5 (ref 5.0–7.5)

## 2020-07-12 LAB — MICROSCOPIC EXAMINATION: Epithelial Cells (non renal): >10 /hpf — AB (ref 0–10)

## 2020-07-17 LAB — CALCULI, WITH PHOTOGRAPH (CLINICAL LAB)
Calcium Oxalate Dihydrate: 70 %
Calcium Oxalate Monohydrate: 25 %
Hydroxyapatite: 5 %
Weight Calculi: 26 mg

## 2020-08-10 DIAGNOSIS — F4321 Adjustment disorder with depressed mood: Secondary | ICD-10-CM

## 2020-08-10 HISTORY — DX: Adjustment disorder with depressed mood: F43.21

## 2020-08-20 ENCOUNTER — Ambulatory Visit: Payer: Self-pay | Admitting: Physician Assistant

## 2020-08-21 ENCOUNTER — Other Ambulatory Visit: Payer: Self-pay

## 2020-08-21 ENCOUNTER — Telehealth (INDEPENDENT_AMBULATORY_CARE_PROVIDER_SITE_OTHER): Payer: 59 | Admitting: Family Medicine

## 2020-08-21 ENCOUNTER — Encounter: Payer: Self-pay | Admitting: Family Medicine

## 2020-08-21 VITALS — Ht 65.0 in

## 2020-08-21 DIAGNOSIS — J209 Acute bronchitis, unspecified: Secondary | ICD-10-CM | POA: Diagnosis not present

## 2020-08-21 MED ORDER — AZITHROMYCIN 250 MG PO TABS: ORAL_TABLET | ORAL | 0 refills | Status: AC

## 2020-08-21 MED ORDER — PREDNISONE 20 MG PO TABS: ORAL_TABLET | ORAL | 0 refills | Status: DC

## 2020-08-21 NOTE — Progress Notes (Signed)
Rebecca Allen T. [name], MD [facility] [place] [facility] [place], [address] Phone: [phone]  FAX: [phone]  [name] - 55 y.o. female  MRN [id number]  Date of Birth: 1965/08/28  Visit Date: 08/21/2020  PCP: Jinny Sanders, MD  Referred by: Jinny Sanders, MD  Virtual Visit via Video Note:  I connected with  Rebecca Allen on 08/21/2020 11:00 AM EST by a video enabled telemedicine application and verified that I am speaking with the correct person using two identifiers.   Location patient: home computer, tablet, or smartphone Location provider: work or home office Consent: Verbal consent directly obtained from [name]. Persons participating in the virtual visit: patient, provider  I discussed the limitations of evaluation and management by telemedicine and the availability of in person appointments. The patient expressed understanding and agreed to proceed.  Interactive audio and video telecommunications were attempted between this provider and patient, however failed, due to patient having technical difficulties OR patient did not have access to video capability.  We continued and completed visit with audio only.    Chief Complaint  Patient presents with  . Cough    Negative Covid test yesterday    History of Present Illness:  She does present with a cough, in has some risk factors and that she is a smoker.  She has not been vaccinated against COVID-19.  She is tested twice a week for COVID-19 for her workplace.  She has been negative, and she will continue to be tested twice a week.  Congested and coughing.  No fever.  All in her chest.  She does think she is having a little bit of some shortness of breath or wheezing.  She has been a longstanding smoker.  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  Yesterday - came back.  Ears - left are a little  sensitive.  Otherwise she denies any sore throat, headache, body aches, chills or fevers.  1/4 pack a day   Immunization History  Administered Date(s) Administered  . Td 08/11/1999  . Tdap 07/07/2016     Review of Systems as above: See pertinent positives and pertinent negatives per HPI No acute distress verbally   Observations/Objective/Exam:  An attempt was made to discern vital signs over the phone and per patient if applicable and possible.   General:    Alert, Oriented, appears well and in no acute distress  Pulmonary:     On inspection no signs of respiratory distress.  Psych / Neurological:     Pleasant and cooperative.  Assessment and Plan:    ICD-10-CM   1. Acute bronchitis, unspecified organism  J20.9    Total encounter time: 10 minutes. This includes total time spent on the day of encounter.  Multiple negative COVID-19 test.  Supportive care.  With a history of smoking for quite some time, I am going to place the patient on some Zithromax as well as give her some oral steroids.  I discussed the assessment and treatment plan with the patient. The patient was provided an opportunity to ask questions and all were answered. The patient agreed with the plan and demonstrated an understanding of the instructions.   The patient was advised to call back or seek an in-person evaluation if the symptoms worsen or if the condition fails to improve as anticipated.  Follow-up: prn unless noted otherwise below No follow-ups  on file.  Meds ordered this encounter  Medications  . azithromycin (ZITHROMAX) 250 MG tablet    Sig: Take 2 tablets (500 mg total) by mouth daily for 1 day, THEN 1 tablet (250 mg total) daily for 4 days.    Dispense:  6 tablet    Refill:  0  . predniSONE (DELTASONE) 20 MG tablet    Sig: 2 tabs po for 4 days, then 1 tab po for 4 days    Dispense:  12 tablet    Refill:  0   No orders of the defined types were placed in this  encounter.   Signed,  Maud Deed. Keaton Stirewalt, MD

## 2020-08-26 ENCOUNTER — Ambulatory Visit: Admission: RE | Admit: 2020-08-26 | Payer: 59 | Source: Ambulatory Visit

## 2020-08-29 ENCOUNTER — Ambulatory Visit
Admission: RE | Admit: 2020-08-29 | Discharge: 2020-08-29 | Disposition: A | Discharge status: Home / Self Care | Payer: 59 | Source: Ambulatory Visit | Attending: Physician Assistant | Admitting: Physician Assistant

## 2020-08-29 ENCOUNTER — Other Ambulatory Visit: Payer: Self-pay

## 2020-08-29 DIAGNOSIS — S37012A Minor contusion of left kidney, initial encounter: Secondary | ICD-10-CM | POA: Insufficient documentation

## 2020-09-03 ENCOUNTER — Ambulatory Visit: Payer: 59 | Admitting: Physician Assistant

## 2020-09-03 ENCOUNTER — Encounter: Payer: Self-pay | Admitting: Physician Assistant

## 2020-09-03 ENCOUNTER — Other Ambulatory Visit: Payer: Self-pay

## 2020-09-03 VITALS — BP 118/67 | HR 98 | Ht 65.0 in | Wt 181.0 lb

## 2020-09-03 DIAGNOSIS — R109 Unspecified abdominal pain: Secondary | ICD-10-CM | POA: Diagnosis not present

## 2020-09-03 DIAGNOSIS — S37012D Minor contusion of left kidney, subsequent encounter: Secondary | ICD-10-CM | POA: Diagnosis not present

## 2020-09-03 MED ORDER — ONDANSETRON HCL 4 MG PO TABS
4.0000 mg | ORAL_TABLET | Freq: Three times a day (TID) | ORAL | 0 refills | Status: AC | PRN
Start: 2020-09-03 — End: 2020-09-17

## 2020-09-03 NOTE — Progress Notes (Signed)
09/03/2020 4:29 PM   Rebecca Allen [birth year] [id number]  CC: Chief Complaint  Patient presents with   Follow-up    HPI: Rebecca Allen is a 55 y.o. female s/p ESWL with Dr. Bernardo Heater on 06/27/2020 for management of a 6 mm left UPJ stone with subsequent development of a left subcapsular hematoma who presents today for symptom recheck.  Today she reports ongoing left flank discomfort and intermittent LLQ pain as well as nausea since undergoing ESWL.  She is unsure if this represents kidney or MSK pain.  Additionally, she reports general shakiness and malaise.  She has been using a heating pad intermittently at home and notes this somewhat improves her flank discomfort.  Renal ultrasound dated 08/29/2020 notable for stable appearance of the left lower pole subcapsular hematoma.  In-office UA today positive for trace ketones; urine microscopy with moderate bacteria.   PMH: Past Medical History:  Diagnosis Date   Depression    GERD (gastroesophageal reflux disease)    History of kidney stones    Hyperlipidemia    Kidney stone    Pneumonia    Treadmill stress test negative for angina pectoris 12/18/2010    Surgical History: Past Surgical History:  Procedure Laterality Date   ABDOMINAL HYSTERECTOMY  2001   with 1 ovary removed; remaining over removed a yr later   CHOLECYSTECTOMY  2002   [place] LITHOTRIPSY Left 06/27/2020   Procedure: EXTRACORPOREAL SHOCK WAVE LITHOTRIPSY (ESWL);  Surgeon: Abbie Sons, MD;  Location: [facility] ORS;  Service: Urology;  Laterality: Left;   UPPER GI ENDOSCOPY  2019    Home Medications:  Allergies as of 09/03/2020      Reactions   Metoclopramide Hcl    REACTION: Nervousness   Morphine And Related Nausea Only   Venlafaxine Other (See Comments)   Hot flashes, clammy and nausea      Medication List       Accurate as of September 03, 2020  4:29 PM. If you have any questions, ask  your nurse or doctor.        STOP taking these medications   predniSONE 20 MG tablet Commonly known as: DELTASONE Stopped by: Debroah Loop, PA-C     TAKE these medications   ALPRAZolam 0.5 MG tablet Commonly known as: XANAX Take 0.5-1 tablets (0.25-0.5 mg total) by mouth daily as needed for anxiety.   aspirin 81 MG chewable tablet Chew 81 mg by mouth daily.   calcium carbonate 500 MG chewable tablet Commonly known as: TUMS - dosed in mg elemental calcium Chew 6 tablets by mouth daily as needed for indigestion or heartburn.   escitalopram 5 MG tablet Commonly known as: LEXAPRO Take 1 tablet by mouth daily.   estradiol 0.05 MG/24HR patch Commonly known as: VIVELLE-DOT PLACE 1 PATCH TRANS-DERMALLY TWICE A WEEK. REMOVE OLD PATCH BEFORE APPLYING NEW PATCH.   ondansetron 4 MG tablet Commonly known as: Zofran Take 1 tablet (4 mg total) by mouth every 8 (eight) hours as needed for up to 14 days. Started by: Debroah Loop, PA-C   pantoprazole 40 MG tablet Commonly known as: PROTONIX TAKE 1 TABLET BY MOUTH EVERY DAY   vitamin C with rose hips 500 MG tablet Take 500 mg by mouth daily.   Vitamin D3 25 MCG (1000 UT) Caps Take by mouth.   zinc gluconate 50 MG tablet Take 50 mg by mouth daily.       Allergies:  Allergies  Allergen Reactions  Metoclopramide Hcl     REACTION: Nervousness   Morphine And Related Nausea Only   Venlafaxine Other (See Comments)    Hot flashes, clammy and nausea    Family History: Family History  Problem Relation Age of Onset   COPD Mother    Heart attack Father 76   Hypertension Father    Heart disease Father        stent placement    Leukemia Brother    Kidney failure Maternal Grandfather    COPD Paternal Grandmother    Congenital heart disease Sister    Breast cancer Neg Hx     Social History:   reports that she has been smoking cigarettes. She has a 7.50 pack-year smoking history. She has never  used smokeless tobacco. She reports current alcohol use of about 2.0 standard drinks of alcohol per week. She reports that she does not use drugs.  Physical Exam: BP 118/67    Pulse 98    Ht 5\' 5"  (1.651 m)    Wt 181 lb (82.1 kg)    BMI 30.12 kg/m   Constitutional:  Alert and oriented, no acute distress, nontoxic appearing HEENT: [place], AT Cardiovascular: No clubbing, cyanosis, or edema Respiratory: Normal respiratory effort, no increased work of breathing Skin: No rashes, bruises or suspicious lesions Neurologic: Grossly intact, no focal deficits, moving all 4 extremities Psychiatric: Normal mood and affect  Laboratory Data: Results for orders placed or performed in visit on 09/03/20  Microscopic Examination   Urine  Result Value Ref Range   WBC, UA 0-5 0 - 5 /hpf   RBC 0-2 0 - 2 /hpf   Epithelial Cells (non renal) 0-10 0 - 10 /hpf   Bacteria, UA Moderate (A) None seen/Few  Urinalysis, Complete  Result Value Ref Range   Specific Gravity, UA 1.025 1.005 - 1.030   pH, UA 6.0 5.0 - 7.5   Color, UA Yellow Yellow   Appearance Ur Hazy (A) Clear   Leukocytes,UA Negative Negative   Protein,UA Negative Negative/Trace   Glucose, UA Negative Negative   Ketones, UA Trace (A) Negative   RBC, UA Negative Negative   Bilirubin, UA Negative Negative   Urobilinogen, Ur 0.2 0.2 - 1.0 mg/dL   Nitrite, UA Negative Negative   Microscopic Examination See below:   Basic metabolic panel  Result Value Ref Range   Glucose 99 65 - 99 mg/dL   BUN 15 6 - 24 mg/dL   Creatinine, Ser 0.78 0.57 - 1.00 mg/dL   GFR calc non Af Amer 88 >59 mL/min/1.73   GFR calc Af Amer 101 >59 mL/min/1.73   BUN/Creatinine Ratio 19 9 - 23   Sodium 141 134 - 144 mmol/L   Potassium 4.4 3.5 - 5.2 mmol/L   Chloride 102 96 - 106 mmol/L   CO2 24 20 - 29 mmol/L   Calcium 9.8 8.7 - 10.2 mg/dL  CBC  Result Value Ref Range   WBC 7.5 3.4 - 10.8 x10E3/uL   RBC 4.55 3.77 - 5.28 x10E6/uL   Hemoglobin 14.3 11.1 - 15.9 g/dL    Hematocrit 42.3 34.0 - 46.6 %   MCV 93 79 - 97 fL   MCH 31.4 26.6 - 33.0 pg   MCHC 33.8 31.5 - 35.7 g/dL   RDW 12.4 11.7 - 15.4 %   Platelets 263 150 - 450 x10E3/uL   Pertinent Imaging: Results for orders placed during the hospital encounter of 08/29/20  US RENAL  Narrative CLINICAL DATA:  Left subcapsular hematoma  EXAM: RENAL / URINARY TRACT ULTRASOUND COMPLETE  COMPARISON:  CT renal 06/28/2020.  FINDINGS: Right Kidney:  Renal measurements: 11 x 4.5 x 5.5 cm = volume: 157 mL. Echogenicity within normal limits. No mass or hydronephrosis visualized.  Left Kidney:  Renal measurements: 10.6 x 5.8 x 5.1 cm = volume: 164 mL. There is an elliptical 2.5 x 1 x 4 cm hypoechoic lesion along the interpolar region of the kidney. Echogenicity within normal limits. No mass or hydronephrosis visualized.  Urinary bladder:  Appears normal for degree of bladder distention.  Other:  None.  IMPRESSION: An elliptical 2.5 x 1 x 4 cm hypoechoic lesion along the interpolar region of the left kidney could represent the previously identified subcapsular hematoma.   Electronically Signed By: Iven Finn M.D. On: 08/29/2020 23:57  I personally reviewed the images referenced above and note stable appearance of the left subcapsular hematoma with no hydronephrosis.  Assessment & Plan:   1. Hematoma of left kidney, subsequent encounter Stable on imaging, though possibly symptomatic as below.  I obtained a BMP and CBC today to evaluate renal function and blood counts, though I suspect these will be stable.  Counseled patient that it may take time for her body to resorb the hematoma.  She expressed understanding. - Basic metabolic panel - CBC  2. Left flank pain UA today notable only for bacteriuria, will send for culture for further evaluation and treat as indicated.  Will prescribe a short course of Zofran for nausea.  Counseled patient to take Tylenol and continue using a heating  pad to relieve her flank pain, which is likely associated with her hematoma. - Urinalysis, Complete - CULTURE, URINE COMPREHENSIVE - ondansetron (ZOFRAN) 4 MG tablet; Take 1 tablet (4 mg total) by mouth every 8 (eight) hours as needed for up to 14 days.  Dispense: 20 tablet; Refill: 0  Return if symptoms worsen or fail to improve.  Debroah Loop, PA-C  [facility] [address] [place], [place] [address] [phone]

## 2020-09-04 ENCOUNTER — Telehealth: Payer: Self-pay | Admitting: Physician Assistant

## 2020-09-04 LAB — BASIC METABOLIC PANEL
BUN/Creatinine Ratio: 19 (ref 9–23)
BUN: 15 mg/dL (ref 6–24)
CO2: 24 mmol/L (ref 20–29)
Calcium: 9.8 mg/dL (ref 8.7–10.2)
Chloride: 102 mmol/L (ref 96–106)
Creatinine, Ser: 0.78 mg/dL (ref 0.57–1.00)
GFR calc Af Amer: 101 mL/min/{1.73_m2} (ref >59)
GFR calc non Af Amer: 88 mL/min/{1.73_m2} (ref >59)
Glucose: 99 mg/dL (ref 65–99)
Potassium: 4.4 mmol/L (ref 3.5–5.2)
Sodium: 141 mmol/L (ref 134–144)

## 2020-09-04 LAB — CBC
Hematocrit: 42.3 % (ref 34.0–46.6)
Hemoglobin: 14.3 g/dL (ref 11.1–15.9)
MCH: 31.4 pg (ref 26.6–33.0)
MCHC: 33.8 g/dL (ref 31.5–35.7)
MCV: 93 fL (ref 79–97)
Platelets: 263 10*3/uL (ref 150–450)
RBC: 4.55 x10E6/uL (ref 3.77–5.28)
RDW: 12.4 % (ref 11.7–15.4)
WBC: 7.5 10*3/uL (ref 3.4–10.8)

## 2020-09-04 LAB — URINALYSIS, COMPLETE
Bilirubin, UA: NEGATIVE
Glucose, UA: NEGATIVE
Leukocytes,UA: NEGATIVE
Nitrite, UA: NEGATIVE
Protein,UA: NEGATIVE
RBC, UA: NEGATIVE
Specific Gravity, UA: 1.025 (ref 1.005–1.030)
Urobilinogen, Ur: 0.2 mg/dL (ref 0.2–1.0)
pH, UA: 6 (ref 5.0–7.5)

## 2020-09-04 LAB — MICROSCOPIC EXAMINATION

## 2020-09-04 NOTE — Telephone Encounter (Signed)
Please contact the patient and inform her that her hemoglobin, hematocrit, and renal function are stable per her labs yesterday.  This is great news.  She should continue Tylenol and heating pads for management of left flank pain possibly associated with her subcapsular hematoma.

## 2020-09-05 NOTE — Telephone Encounter (Signed)
Notified patient as advised, patient verbalized understanding. Patient wants to know could it ever be possible for her subcapsular hematoma to separate and travel elsewhere in her body? Please advise.

## 2020-09-05 NOTE — Telephone Encounter (Signed)
No, not possible.

## 2020-09-07 LAB — CULTURE, URINE COMPREHENSIVE

## 2020-09-09 ENCOUNTER — Telehealth: Payer: Self-pay

## 2020-09-09 NOTE — Telephone Encounter (Signed)
Per DPR, LMOM advising patient.

## 2020-09-09 NOTE — Telephone Encounter (Signed)
-----   Message from Debroah Loop, [place] sent at 09/09/2020 12:45 PM EST ----- Please contact her and let her know her ucx is negative, no infection. ----- Message ----- From: Interface, [facility] Lab Results In Sent: 09/04/2020   5:37 AM EST To: Debroah Loop, PA-C

## 2020-10-08 ENCOUNTER — Other Ambulatory Visit: Payer: Self-pay

## 2020-10-08 ENCOUNTER — Encounter: Payer: Self-pay | Admitting: Family Medicine

## 2020-10-08 ENCOUNTER — Ambulatory Visit: Payer: 59 | Admitting: Family Medicine

## 2020-10-08 VITALS — BP 104/80 | HR 79 | Temp 98.0°F | Ht 65.0 in | Wt 181.5 lb

## 2020-10-08 DIAGNOSIS — Z13220 Encounter for screening for lipoid disorders: Secondary | ICD-10-CM

## 2020-10-08 DIAGNOSIS — M25531 Pain in right wrist: Secondary | ICD-10-CM | POA: Insufficient documentation

## 2020-10-08 DIAGNOSIS — Z131 Encounter for screening for diabetes mellitus: Secondary | ICD-10-CM

## 2020-10-08 DIAGNOSIS — R5383 Other fatigue: Secondary | ICD-10-CM

## 2020-10-08 DIAGNOSIS — K219 Gastro-esophageal reflux disease without esophagitis: Secondary | ICD-10-CM

## 2020-10-08 DIAGNOSIS — H538 Other visual disturbances: Secondary | ICD-10-CM

## 2020-10-08 LAB — LIPID PANEL
Cholesterol: 234 mg/dL — ABNORMAL HIGH (ref 0–200)
HDL: 62.8 mg/dL (ref >39.00)
NonHDL: 171.05
Total CHOL/HDL Ratio: 4
Triglycerides: 234 mg/dL — ABNORMAL HIGH (ref 0.0–149.0)
VLDL: 46.8 mg/dL — ABNORMAL HIGH (ref 0.0–40.0)

## 2020-10-08 LAB — CBC WITH DIFFERENTIAL/PLATELET
Basophils Absolute: 0 10*3/uL (ref 0.0–0.1)
Basophils Relative: 0.4 % (ref 0.0–3.0)
Eosinophils Absolute: 0.2 10*3/uL (ref 0.0–0.7)
Eosinophils Relative: 2.7 % (ref 0.0–5.0)
HCT: 41.8 % (ref 36.0–46.0)
Hemoglobin: 14.2 g/dL (ref 12.0–15.0)
Lymphocytes Relative: 33 % (ref 12.0–46.0)
Lymphs Abs: 2.1 10*3/uL (ref 0.7–4.0)
MCHC: 34 g/dL (ref 30.0–36.0)
MCV: 91.3 fl (ref 78.0–100.0)
Monocytes Absolute: 0.4 10*3/uL (ref 0.1–1.0)
Monocytes Relative: 7 % (ref 3.0–12.0)
Neutro Abs: 3.6 10*3/uL (ref 1.4–7.7)
Neutrophils Relative %: 56.9 % (ref 43.0–77.0)
Platelets: 274 10*3/uL (ref 150.0–400.0)
RBC: 4.58 Mil/uL (ref 3.87–5.11)
RDW: 13.2 % (ref 11.5–15.5)
WBC: 6.3 10*3/uL (ref 4.0–10.5)

## 2020-10-08 LAB — COMPREHENSIVE METABOLIC PANEL
ALT: 51 U/L — ABNORMAL HIGH (ref 0–35)
AST: 34 U/L (ref 0–37)
Albumin: 4.8 g/dL (ref 3.5–5.2)
Alkaline Phosphatase: 55 U/L (ref 39–117)
BUN: 19 mg/dL (ref 6–23)
CO2: 29 mEq/L (ref 19–32)
Calcium: 9.8 mg/dL (ref 8.4–10.5)
Chloride: 100 mEq/L (ref 96–112)
Creatinine, Ser: 0.8 mg/dL (ref 0.40–1.20)
GFR: 84.4 mL/min (ref >60.00)
Glucose, Bld: 91 mg/dL (ref 70–99)
Potassium: 4.1 mEq/L (ref 3.5–5.1)
Sodium: 138 mEq/L (ref 135–145)
Total Bilirubin: 0.5 mg/dL (ref 0.2–1.2)
Total Protein: 7.2 g/dL (ref 6.0–8.3)

## 2020-10-08 LAB — T3, FREE: T3, Free: 2.8 pg/mL (ref 2.3–4.2)

## 2020-10-08 LAB — T4, FREE: Free T4: 0.67 ng/dL (ref 0.60–1.60)

## 2020-10-08 LAB — HEMOGLOBIN A1C: Hgb A1c MFr Bld: 5.8 % (ref 4.6–6.5)

## 2020-10-08 LAB — VITAMIN B12: Vitamin B-12: 206 pg/mL — ABNORMAL LOW (ref 211–911)

## 2020-10-08 LAB — LDL CHOLESTEROL, DIRECT: Direct LDL: 142 mg/dL

## 2020-10-08 LAB — TSH: TSH: 1.28 u[IU]/mL (ref 0.35–4.50)

## 2020-10-08 NOTE — Progress Notes (Signed)
Patient ID: CLOTILDA HAFER, female    DOB: 12-Jun-1966, 55 y.o.   MRN: [id number]  This visit was conducted in person.  BP 104/80   Pulse 79   Temp 98 F (36.7 C) (Temporal)   Ht 5\' 5"  (1.651 m)   Wt 181 lb 8 oz (82.3 kg)   SpO2 98%   BMI 30.20 kg/m    CC:  Chief Complaint  Patient presents with  . Blurred Vision  . Fatigue  . Wrist Pain    Right  . Tingling    Left Arm    Subjective:   HPI: Lasean Jerilynn Mages Veltri is a 55 y.o. female presenting on 10/08/2020 for Blurred Vision, Fatigue, Wrist Pain (Right), and Tingling (Left Arm)  1.Fatigue off and on for several months Normal cbc in 07/2020  no recent B12.sleeping well at night.  no Cp, no SOB.   2. Blurred vision.. intermittently for several months  Went to eye MD .. given reading glasses... takes sometime for eye to adjust. 3. Right wrist pain, ttp at base of thumb. Occurred after ziplining in 05/2020.. persisted since. No swelling, no redness, no numbness or weakness in that hand/wrist.  4. Left arm tingling:  Started yesterday hen woke up in Am.. last d all day, now gone.  5. BMs every 3-4 days, but loose using probiotic.. has not helped much.  Having GERD given trying to eat more for kidney stone, s/p lithotripsy.      OF note GAD and MDD not ideally controlled, although improved .Marland Kitchen may need to readdress at future OV. GAD 7 : Generalized Anxiety Score 10/08/2020 04/02/2020 10/10/2019 10/18/2018  Nervous, Anxious, on Edge 2 3 3 3   Control/stop worrying 2 3 3 2   Worry too much - different things 2 3 3 2   Trouble relaxing 1 1 2 2   Restless 1 2 1 2   Easily annoyed or irritable 3 3 2 2   Afraid - awful might happen 2 3 2 2   Total GAD 7 Score 13 18 16 15   Anxiety Difficulty Very difficult - Somewhat difficult Very difficult   PHQ9 SCORE ONLY 10/08/2020 04/02/2020 10/10/2019  PHQ-9 Total Score 13 17 9        Relevant past medical, surgical, family and social history reviewed and updated as indicated. Interim  medical history since our last visit reviewed. Allergies and medications reviewed and updated. Outpatient Medications Prior to Visit  Medication Sig Dispense Refill  . ALPRAZolam (XANAX) 0.5 MG tablet Take 0.5-1 tablets (0.25-0.5 mg total) by mouth daily as needed for anxiety. 30 tablet 0  . aspirin 81 MG chewable tablet Chew 81 mg by mouth daily.    . calcium carbonate (TUMS - DOSED IN MG ELEMENTAL CALCIUM) 500 MG chewable tablet Chew 6 tablets by mouth daily as needed for indigestion or heartburn.    . Cholecalciferol (VITAMIN D3) 25 MCG (1000 UT) CAPS Take by mouth.    . estradiol (VIVELLE-DOT) 0.05 MG/24HR patch PLACE 1 PATCH TRANS-DERMALLY TWICE A WEEK. REMOVE OLD PATCH BEFORE APPLYING NEW PATCH. 24 patch 2  . Misc Natural Products (ELDERBERRY ZINC/VIT C/IMMUNE MT) Use as directed 1 tablet in the mouth or throat daily.    . pantoprazole (PROTONIX) 40 MG tablet TAKE 1 TABLET BY MOUTH EVERY DAY 90 tablet 3  . Ascorbic Acid (VITAMIN C WITH ROSE HIPS) 500 MG tablet Take 500 mg by mouth daily.    Marland Kitchen escitalopram (LEXAPRO) 5 MG tablet Take 1 tablet by mouth daily. (Patient not  taking: Reported on 10/08/2020)    . zinc gluconate 50 MG tablet Take 50 mg by mouth daily.     No facility-administered medications prior to visit.     Per HPI unless specifically indicated in ROS section below Review of Systems  Constitutional: Negative for fatigue and fever.  HENT: Negative for congestion.   Eyes: Negative for pain.  Respiratory: Negative for cough and shortness of breath.   Cardiovascular: Negative for chest pain, palpitations and leg swelling.  Gastrointestinal: Negative for abdominal pain.  Genitourinary: Negative for dysuria and vaginal bleeding.  Musculoskeletal: Negative for back pain.  Neurological: Negative for syncope, light-headedness and headaches.  Psychiatric/Behavioral: Negative for dysphoric mood.   Objective:  BP 104/80   Pulse 79   Temp 98 F (36.7 C) (Temporal)   Ht 5\' 5"   (1.651 m)   Wt 181 lb 8 oz (82.3 kg)   SpO2 98%   BMI 30.20 kg/m   Wt Readings from Last 3 Encounters:  10/08/20 181 lb 8 oz (82.3 kg)  09/03/20 181 lb (82.1 kg)  07/11/20 178 lb (80.7 kg)      Physical Exam Constitutional:      General: She is not in acute distress.Vital signs are normal.     Appearance: Normal appearance. She is well-developed and well-nourished. She is not ill-appearing or toxic-appearing.  HENT:     Head: Normocephalic.     Right Ear: Hearing, tympanic membrane, ear canal and external ear normal. Tympanic membrane is not erythematous, retracted or bulging.     Left Ear: Hearing, tympanic membrane, ear canal and external ear normal. Tympanic membrane is not erythematous, retracted or bulging.     Nose: No mucosal edema or rhinorrhea.     Right Sinus: No maxillary sinus tenderness or frontal sinus tenderness.     Left Sinus: No maxillary sinus tenderness or frontal sinus tenderness.     Mouth/Throat:     Mouth: Oropharynx is clear and moist and mucous membranes are normal.     Pharynx: Uvula midline.  Eyes:     General: Lids are normal. Lids are everted, no foreign bodies appreciated.     Extraocular Movements: EOM normal.     Conjunctiva/sclera: Conjunctivae normal.     Pupils: Pupils are equal, round, and reactive to light.  Neck:     Thyroid: No thyroid mass or thyromegaly.     Vascular: No carotid bruit.     Trachea: Trachea normal.  Cardiovascular:     Rate and Rhythm: Normal rate and regular rhythm.     Pulses: Normal pulses and intact distal pulses.     Heart sounds: Normal heart sounds, S1 normal and S2 normal. No murmur heard. No friction rub. No gallop.   Pulmonary:     Effort: Pulmonary effort is normal. No tachypnea or respiratory distress.     Breath sounds: Normal breath sounds. No decreased breath sounds, wheezing, rhonchi or rales.  Abdominal:     General: Bowel sounds are normal.     Palpations: Abdomen is soft.     Tenderness: There is  no abdominal tenderness.  Musculoskeletal:     Right wrist: Tenderness present. No swelling, deformity, bony tenderness, snuff box tenderness or crepitus. Normal range of motion.     Cervical back: Normal range of motion and neck supple.  Skin:    General: Skin is warm, dry and intact.     Findings: No rash.  Neurological:     Mental Status: She is alert.  Psychiatric:        Mood and Affect: Mood is not anxious or depressed.        Speech: Speech normal.        Behavior: Behavior normal. Behavior is cooperative.        Thought Content: Thought content normal.        Cognition and Memory: Cognition and memory normal.        Judgment: Judgment normal.       Results for orders placed or performed in visit on 09/03/20  CULTURE, URINE COMPREHENSIVE   Specimen: Urine   UR  Result Value Ref Range   Urine Culture, Comprehensive Final report    Organism ID, Bacteria Comment   Microscopic Examination   Urine  Result Value Ref Range   WBC, UA 0-5 0 - 5 /hpf   RBC 0-2 0 - 2 /hpf   Epithelial Cells (non renal) 0-10 0 - 10 /hpf   Bacteria, UA Moderate (A) None seen/Few  Urinalysis, Complete  Result Value Ref Range   Specific Gravity, UA 1.025 1.005 - 1.030   pH, UA 6.0 5.0 - 7.5   Color, UA Yellow Yellow   Appearance Ur Hazy (A) Clear   Leukocytes,UA Negative Negative   Protein,UA Negative Negative/Trace   Glucose, UA Negative Negative   Ketones, UA Trace (A) Negative   RBC, UA Negative Negative   Bilirubin, UA Negative Negative   Urobilinogen, Ur 0.2 0.2 - 1.0 mg/dL   Nitrite, UA Negative Negative   Microscopic Examination See below:   Basic metabolic panel  Result Value Ref Range   Glucose 99 65 - 99 mg/dL   BUN 15 6 - 24 mg/dL   Creatinine, Ser 0.78 0.57 - 1.00 mg/dL   GFR calc non Af Amer 88 >59 mL/min/1.73   GFR calc Af Amer 101 >59 mL/min/1.73   BUN/Creatinine Ratio 19 9 - 23   Sodium 141 134 - 144 mmol/L   Potassium 4.4 3.5 - 5.2 mmol/L   Chloride 102 96 - 106  mmol/L   CO2 24 20 - 29 mmol/L   Calcium 9.8 8.7 - 10.2 mg/dL  CBC  Result Value Ref Range   WBC 7.5 3.4 - 10.8 x10E3/uL   RBC 4.55 3.77 - 5.28 x10E6/uL   Hemoglobin 14.3 11.1 - 15.9 g/dL   Hematocrit 42.3 34.0 - 46.6 %   MCV 93 79 - 97 fL   MCH 31.4 26.6 - 33.0 pg   MCHC 33.8 31.5 - 35.7 g/dL   RDW 12.4 11.7 - 15.4 %   Platelets 263 150 - 450 x10E3/uL    This visit occurred during the SARS-CoV-2 public health emergency.  Safety protocols were in place, including screening questions prior to the visit, additional usage of staff PPE, and extensive cleaning of exam room while observing appropriate contact time as indicated for disinfecting solutions.   COVID 19 screen:  No recent travel or known exposure to COVID19 The patient denies respiratory symptoms of COVID 19 at this time. The importance of social distancing was discussed today.   Assessment and Plan    Problem List Items Addressed This Visit    Blurred vision, bilateral    Most likely due to aging changes of eye, but will eval glucose and A1C to rule out new diabetes.       Fatigue - Primary    Eval with labs.      Relevant Orders   TSH   CBC with Differential/Platelet   T3,  free   T4, free   Vitamin B12   Comprehensive metabolic panel   GERD    Recurrent since told to have citris with recent kidney stone.   Stop citrus and increase PPI to BID temporarily. If not improving follow up with GI.      Right wrist pain    No S/S carpal tunnel. No focal pain suggesting fracture.  Most likely wrist sprain. Use topical diclofenac gel and start home PT.       Other Visit Diagnoses    Screening cholesterol level       Relevant Orders   Lipid panel   Screening for diabetes mellitus (DM)       Relevant Orders   Hemoglobin A1c      Eliezer Lofts, MD

## 2020-10-08 NOTE — Assessment & Plan Note (Signed)
Eval with labs. 

## 2020-10-08 NOTE — Assessment & Plan Note (Signed)
Most likely due to aging changes of eye, but will eval glucose and A1C to rule out new diabetes.

## 2020-10-08 NOTE — Assessment & Plan Note (Signed)
Recurrent since told to have citris with recent kidney stone.   Stop citrus and increase PPI to BID temporarily. If not improving follow up with GI.

## 2020-10-08 NOTE — Patient Instructions (Addendum)
Cut out citrus again .Marland Kitchen  Can take pantoprazole 40 mg twice daily temporarily.  Start topical Voltaren gel four times daily.  Start home wrist physical therapy.  Can try Align as a probiotic.  Please stop at the lab to have labs drawn.

## 2020-10-08 NOTE — Assessment & Plan Note (Signed)
No S/S carpal tunnel. No focal pain suggesting fracture.  Most likely wrist sprain. Use topical diclofenac gel and start home PT.

## 2020-10-11 ENCOUNTER — Telehealth: Payer: Self-pay | Admitting: Family Medicine

## 2020-10-11 NOTE — Telephone Encounter (Signed)
Patient called to get the results of her lab work.  Please call patient.

## 2020-10-14 ENCOUNTER — Encounter: Payer: Self-pay | Admitting: *Deleted

## 2020-12-31 ENCOUNTER — Telehealth (INDEPENDENT_AMBULATORY_CARE_PROVIDER_SITE_OTHER): Payer: 59 | Admitting: Family Medicine

## 2020-12-31 ENCOUNTER — Encounter: Payer: Self-pay | Admitting: Family Medicine

## 2020-12-31 ENCOUNTER — Other Ambulatory Visit: Payer: Self-pay

## 2020-12-31 VITALS — Ht 65.0 in

## 2020-12-31 DIAGNOSIS — F411 Generalized anxiety disorder: Secondary | ICD-10-CM | POA: Diagnosis not present

## 2020-12-31 DIAGNOSIS — J309 Allergic rhinitis, unspecified: Secondary | ICD-10-CM

## 2020-12-31 MED ORDER — PREDNISONE 20 MG PO TABS: ORAL_TABLET | ORAL | 0 refills | Status: DC

## 2020-12-31 MED ORDER — ALPRAZOLAM 0.5 MG PO TABS: 0.2500 mg | ORAL_TABLET | Freq: Every day | ORAL | 0 refills | Status: DC | PRN

## 2020-12-31 NOTE — Patient Instructions (Addendum)
Change to zyrtec.  Continue nasal steroid spray.  Start mucinex D.  Compelte prednisone.  If not improving call for possible antibiotics as well as having a COVID test.

## 2020-12-31 NOTE — Assessment & Plan Note (Signed)
Change to zyrtec.  Continue nasal steroid spray.  Start mucinex D.  Compelte prednisone.  If not improving call for possible antibiotics as well as having a COVID test.

## 2020-12-31 NOTE — Progress Notes (Signed)
VIRTUAL VISIT Due to national recommendations of social distancing due to [place] 19, a virtual visit is felt to be most appropriate for this patient at this time.   I connected with the patient on 12/31/20 at  2:00 PM EDT by virtual telehealth platform and verified that I am speaking with the correct person using two identifiers.   I discussed the limitations, risks, security and privacy concerns of performing an evaluation and management service by  virtual telehealth platform and the availability of in person appointments. I also discussed with the patient that there may be a patient responsible charge related to this service. The patient expressed understanding and agreed to proceed.  Patient location: Home Provider Location: [facility] Participants: Eliezer Lofts and [place]   Chief Complaint  Patient presents with  . Cough  . Sore Throat  . Headache    History of Present Illness:  55 year old female present  With new onset symptoms on 12/29/2020.  Started with ST, headache sinus pressure.  Mild left ear pain, wet in ear canal.  Cough some off and on.  No SOB. No wheezing. No myalgia. No fever.  Tried robitussin.  Cough is  Not keeping her up at night.  Using nasal spray and steroid prn.   HX of allergies. She is a current smoker.  No COPD or asthma   Had COVID 04/2021, recovered completely.   GAD... having an increase in  Stress and panic attacks.. need a refgill of alprazolam for rescue.  COVID 19 screen COVID testing: PCR test at work 5/20 COVID vaccine: none COVID exposure: No recent travel or known exposure to [place]  The importance of social distancing was discussed today.     Review of Systems  All other systems reviewed and are negative.     Past Medical History:  Diagnosis Date  . Depression   . GERD (gastroesophageal reflux disease)   . History of kidney stones   . Hyperlipidemia   . Kidney stone   . Pneumonia   . Treadmill  stress test negative for angina pectoris 12/18/2010    reports that she has been smoking cigarettes. She has a 7.50 pack-year smoking history. She has never used smokeless tobacco. She reports current alcohol use of about 2.0 standard drinks of alcohol per week. She reports that she does not use drugs.   Current Outpatient Medications:  .  ALPRAZolam (XANAX) 0.5 MG tablet, Take 0.5-1 tablets (0.25-0.5 mg total) by mouth daily as needed for anxiety., Disp: 30 tablet, Rfl: 0 .  aspirin 81 MG chewable tablet, Chew 81 mg by mouth daily., Disp: , Rfl:  .  calcium carbonate (TUMS - DOSED IN MG ELEMENTAL CALCIUM) 500 MG chewable tablet, Chew 6 tablets by mouth daily as needed for indigestion or heartburn., Disp: , Rfl:  .  Cholecalciferol (VITAMIN D3) 25 MCG (1000 UT) CAPS, Take by mouth., Disp: , Rfl:  .  estradiol (VIVELLE-DOT) 0.05 MG/24HR patch, PLACE 1 PATCH TRANS-DERMALLY TWICE A WEEK. REMOVE OLD PATCH BEFORE APPLYING NEW PATCH., Disp: 24 patch, Rfl: 2 .  Misc Natural Products (ELDERBERRY ZINC/VIT C/IMMUNE MT), Use as directed 1 tablet in the mouth or throat daily., Disp: , Rfl:  .  pantoprazole (PROTONIX) 40 MG tablet, TAKE 1 TABLET BY MOUTH EVERY DAY, Disp: 90 tablet, Rfl: 3   Observations/Objective: Height 5\' 5"  (1.651 m).  Physical Exam  Physical Exam Constitutional:      General: The patient is not in acute distress. Pulmonary:  Effort: Pulmonary effort is normal. No respiratory distress.  Neurological:     Mental Status: The patient is alert and oriented to person, place, and time.  Psychiatric:        Mood and Affect: Mood normal.        Behavior: Behavior normal.   Assessment and Plan Problem List Items Addressed This Visit    Allergic sinusitis - Primary     Change to zyrtec.  Continue nasal steroid spray.  Start mucinex D.  Compelte prednisone.  If not improving call for possible antibiotics as well as having a COVID test.      Relevant Medications   predniSONE  (DELTASONE) 20 MG tablet   Generalized anxiety disorder    On lexapro.. moderate control. With increase stress need to have refill of alprazolam low dose to use prn. PDMP reviewed during this encounter.       Relevant Medications   ALPRAZolam (XANAX) 0.5 MG tablet     Meds ordered this encounter  Medications  . ALPRAZolam (XANAX) 0.5 MG tablet    Sig: Take 0.5-1 tablets (0.25-0.5 mg total) by mouth daily as needed for anxiety.    Dispense:  30 tablet    Refill:  0  . predniSONE (DELTASONE) 20 MG tablet    Sig: 3 tabs by mouth daily x 3 days, then 2 tabs by mouth daily x 2 days then 1 tab by mouth daily x 2 days    Dispense:  15 tablet    Refill:  0     I discussed the assessment and treatment plan with the patient. The patient was provided an opportunity to ask questions and all were answered. The patient agreed with the plan and demonstrated an understanding of the instructions.   The patient was advised to call back or seek an in-person evaluation if the symptoms worsen or if the condition fails to improve as anticipated.     Eliezer Lofts, MD

## 2020-12-31 NOTE — Assessment & Plan Note (Signed)
On lexapro.. moderate control. With increase stress need to have refill of alprazolam low dose to use prn. PDMP reviewed during this encounter.

## 2021-01-01 ENCOUNTER — Telehealth: Payer: Self-pay | Admitting: Family Medicine

## 2021-01-01 NOTE — Telephone Encounter (Signed)
Spoke with Tish.  Work note written to return to work on 01/07/2021.  She will send daughter to pick up note.

## 2021-01-01 NOTE — Telephone Encounter (Signed)
Rebecca Allen called in and stated that she had a virtual appointment yesterday and Dr. Diona Browner told her if she needed to be out of work then to call back and get a note, she tried to go to work but she felt worse and wanted to know about getting a doctors note.

## 2021-02-07 ENCOUNTER — Telehealth: Payer: Self-pay | Admitting: Family Medicine

## 2021-02-07 MED ORDER — AMOXICILLIN 500 MG PO CAPS: 1000.0000 mg | ORAL_CAPSULE | Freq: Two times a day (BID) | ORAL | 0 refills | Status: DC

## 2021-02-07 NOTE — Telephone Encounter (Signed)
Contacted pt and advised. She said she thinks she is going to go back to claritin and stop zyrtec. She said it has always worked better for her. Pt reports she tested negative for covid today. Advised pt if any symptoms worsen or she develops any new symptoms to contact our office next week or go to a local UC. Advised of ER precautions. Pt verbalized understanding.

## 2021-02-07 NOTE — Telephone Encounter (Signed)
Pt seen earlier in week at virtual visit.Marland Kitchen not improving with prednisone.  Will send in rx for antibiotics for possible bacterial sinus infection.  Please also notify pt to move forward with COVID testing as well if she has not already.

## 2021-02-07 NOTE — Telephone Encounter (Signed)
Rebecca Allen called in stated that the medication that she was given is not working and feels like she needs to get an antibiotic.

## 2021-02-24 ENCOUNTER — Telehealth: Payer: Self-pay

## 2021-02-24 NOTE — Telephone Encounter (Signed)
[facility] - Client TELEPHONE ADVICE RECORD [facility] Patient Name: Rebecca Allen Gender: Female DOB: [birth year] Age: 55 Y 3 M 27 D Return Phone Number: [phone] (Primary) Address: City/ State/ Zip: [place]   [address] Client [facility] - Client Client Site [place] - Day Physician Eliezer Lofts - MD Contact Type Call Who Is Calling Patient / Member / Family / Caregiver Call Type Triage / Clinical Relationship To Patient Self Return Phone Number [phone] (Primary) Chief Complaint NUMBNESS/TINGLING- sudden on one side of the body or face Reason for Call Symptomatic / Request for [name] states she is having left arm tingling and the shakes. Translation No Nurse Assessment Nurse: Laurann Montana, RN, [place] Date/Time Eilene Ghazi Time): 02/24/2021 3:39:41 PM Confirm and document reason for call. If symptomatic, describe symptoms. ---Caller states that she is having tingling in her left arm. Caller states that this has been going on for a while. Caller is tired and doesn't feel right. Caller states that she is anxious. Caller states no other sx. Does the patient have any new or worsening symptoms? ---Yes Will a triage be completed? ---Yes Related visit to physician within the last 2 weeks? ---No Does the PT have any chronic conditions? (i.e. diabetes, asthma, this includes High risk factors for pregnancy, etc.) ---No Is the patient pregnant or possibly pregnant? (Ask all females between the ages of 55-55) ---No Is this a behavioral health or substance abuse call? ---No Guidelines Guideline Title Affirmed Question Affirmed Notes Nurse Date/Time (Eastern Time) Neurologic Deficit [1] Numbness or tingling in one or both feet AND [2] is a chronic symptom (recurrent or ongoing AND present > 4 weeks) Laurann Montana, RN, [place] 02/24/2021  3:41:26 PM PLEASE NOTE: All timestamps contained within this report are represented as [place] Standard Time. CONFIDENTIALTY NOTICE: This fax transmission is intended only for the addressee. It contains information that is legally privileged, confidential or otherwise protected from use or disclosure. If you are not the intended recipient, you are strictly prohibited from reviewing, disclosing, copying using or disseminating any of this information or taking any action in reliance on or regarding this information. If you have received this fax in error, please notify us immediately by telephone so that we can arrange for its return to [place]. Phone: [phone], Toll-Free: [phone], Fax: [phone] Page: 2 of 2 Call Id: [id number] [place]. Time Eilene Ghazi Time) Disposition Final User 02/24/2021 3:37:53 PM Send to Urgent Queue Shann Medal 02/24/2021 3:44:42 PM SEE PCP WITHIN 3 DAYS Yes Laurann Montana, RN, [place] Disagree/Comply Comply Caller Understands Yes PreDisposition Call Doctor Care Advice Given Per Guideline SEE PCP WITHIN 3 DAYS: * You need to be seen within 2 or 3 days. CALL BACK IF: * Constant numbness or weakness in arms or leg

## 2021-02-24 NOTE — Telephone Encounter (Signed)
Pt c/o tingling in left arm, "like when your foot falls asleep",  for several weeks and occurring about once daily for 3-4 min. Pt also c/o shaking all over which is constant which she thinks is from anxiety. She states, "I don't feel normal, I am sluggish, awful, and feel drained."  She reports a hx of covid in Sept of last year and is wondering if these symptoms are from that. Pt denies any other symptoms. Denies SOB, chest pain, vision changes, weakness on one side, HA, or speech changes. Scheduled pt for 7/21 with PCP, which she requested. Advised to f/u with office if any symptoms changed, or she developed any new symptoms. Advised of ER precautions. Pt verbalized understanding.

## 2021-02-27 ENCOUNTER — Ambulatory Visit: Payer: 59 | Admitting: Family Medicine

## 2021-02-27 ENCOUNTER — Other Ambulatory Visit: Payer: Self-pay

## 2021-02-27 ENCOUNTER — Encounter: Payer: Self-pay | Admitting: Family Medicine

## 2021-02-27 VITALS — BP 110/80 | HR 79 | Temp 98.1°F | Ht 65.0 in | Wt 180.5 lb

## 2021-02-27 DIAGNOSIS — R590 Localized enlarged lymph nodes: Secondary | ICD-10-CM | POA: Diagnosis not present

## 2021-02-27 DIAGNOSIS — M79641 Pain in right hand: Secondary | ICD-10-CM | POA: Diagnosis not present

## 2021-02-27 DIAGNOSIS — F411 Generalized anxiety disorder: Secondary | ICD-10-CM

## 2021-02-27 DIAGNOSIS — F321 Major depressive disorder, single episode, moderate: Secondary | ICD-10-CM | POA: Diagnosis not present

## 2021-02-27 DIAGNOSIS — M79642 Pain in left hand: Secondary | ICD-10-CM

## 2021-02-27 MED ORDER — ALPRAZOLAM 0.5 MG PO TABS: 0.2500 mg | ORAL_TABLET | Freq: Every day | ORAL | 0 refills | Status: DC | PRN

## 2021-02-27 MED ORDER — ESCITALOPRAM OXALATE 10 MG PO TABS: 10.0000 mg | ORAL_TABLET | Freq: Every day | ORAL | 3 refills | Status: DC

## 2021-02-27 MED ORDER — AMOXICILLIN-POT CLAVULANATE 875-125 MG PO TABS: 1.0000 | ORAL_TABLET | Freq: Two times a day (BID) | ORAL | 0 refills | Status: DC

## 2021-02-27 NOTE — Patient Instructions (Addendum)
Start  Lexapro 5 mg daily x 1 week, then increase to 10 mg daily.  Increase Voltaren gel to four times daily.   Complete Augmentin x 10 days.  Wear carpal tunnel brace on right arm at night for 3-4 week.

## 2021-02-27 NOTE — Progress Notes (Signed)
Patient ID: Rebecca Allen, female    DOB: [birth year], 55 y.o.   MRN: [id number]  This visit was conducted in person.  BP 110/80   Pulse 79   Temp 98.1 F (36.7 C) (Temporal)   Ht 5\' 5"  (1.651 m)   Wt 180 lb 8 oz (81.9 kg)   SpO2 97%   BMI 30.04 kg/m    CC: Chief Complaint  Patient presents with   Sore Throat   Feels Anxious All The Time    Taking Xanax more than usual   Hand Pain    Bilateral   Flank Pain    Left-Since having lithotripsy    Shakey    Subjective:   HPI: Rebecca Allen is a 55 y.o. female presenting on 02/27/2021 for Sore Throat, Feels Anxious All The Time (Taking Xanax more than usual), Hand Pain (Bilateral), Flank Pain (Left-Since having lithotripsy/), and Shakey    She continues to have neck soreness from 12/2020.. sinus pressure resolved with oral prednisone ( did not complete it) and   amox 1 g BID but still  feels lump in neck on right side.. tender to touch. No change in size. Using nasal steroid prn.  Occ having heartburn.. using tums and prn pantoprazole   HX of allergies. She is a current smoker.  No COPD or asthma   She reports poor control on anxiety.. feels like jumping out of skin. Using alprazolam more ( now daily). Feels shaky in last few weeks.  Alprazolam helps her calm down through the day. Poor sleep at night.   GAD7: 21,  [id number]   Under more stress planning wedding for son. Getting a new puppy.  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  Apply voltaren cream BID for CMC arthritis, may be carpal tunnel.  Tingling in Bilateral hands Has not tried a brace.    Relevant past medical, surgical, family and social history reviewed and updated as indicated. Interim medical history since our last visit reviewed. Allergies and medications reviewed and updated. Outpatient Medications Prior to Visit  Medication Sig Dispense Refill   ALPRAZolam (XANAX) 0.5 MG  tablet Take 0.5-1 tablets (0.25-0.5 mg total) by mouth daily as needed for anxiety. 30 tablet 0   aspirin 81 MG chewable tablet Chew 81 mg by mouth daily.     BIOTIN PO Take 1 tablet by mouth daily.     BLACK CURRANT SEED OIL PO Take 5 mLs by mouth daily.     calcium carbonate (TUMS - DOSED IN MG ELEMENTAL CALCIUM) 500 MG chewable tablet Chew 6 tablets by mouth daily as needed for indigestion or heartburn.     Cholecalciferol (VITAMIN D3) 25 MCG (1000 UT) CAPS Take by mouth.     estradiol (VIVELLE-DOT) 0.05 MG/24HR patch PLACE 1 PATCH TRANS-DERMALLY TWICE A WEEK. REMOVE OLD PATCH BEFORE APPLYING NEW PATCH. 24 patch 2   MILK THISTLE PO Take 1 tablet by mouth daily.     Misc Natural Products (ELDERBERRY ZINC/VIT C/IMMUNE MT) Use as directed 1 tablet in the mouth or throat daily.     pantoprazole (PROTONIX) 40 MG tablet TAKE 1 TABLET BY MOUTH EVERY DAY 90 tablet 3   amoxicillin (AMOXIL) 500 MG capsule Take 2 capsules (1,000 mg total) by mouth 2 (two) times daily. 40 capsule 0   predniSONE (DELTASONE) 20 MG tablet 3 tabs by mouth daily x 3 days, then 2 tabs  by mouth daily x 2 days then 1 tab by mouth daily x 2 days 15 tablet 0   No facility-administered medications prior to visit.     Per HPI unless specifically indicated in ROS section below Review of Systems  Constitutional:  Negative for fatigue and fever.  HENT:  Negative for ear pain.   Eyes:  Negative for pain.  Respiratory:  Negative for chest tightness and shortness of breath.   Cardiovascular:  Negative for chest pain, palpitations and leg swelling.  Gastrointestinal:  Negative for abdominal pain.  Genitourinary:  Negative for dysuria.  Objective:  BP 110/80   Pulse 79   Temp 98.1 F (36.7 C) (Temporal)   Ht 5\' 5"  (1.651 m)   Wt 180 lb 8 oz (81.9 kg)   SpO2 97%   BMI 30.04 kg/m   Wt Readings from Last 3 Encounters:  02/27/21 180 lb 8 oz (81.9 kg)  10/08/20 181 lb 8 oz (82.3 kg)  09/03/20 181 lb (82.1 kg)      Physical  Exam Constitutional:      General: She is not in acute distress.    Appearance: Normal appearance. She is well-developed. She is not ill-appearing or toxic-appearing.  HENT:     Head: Normocephalic.     Right Ear: Hearing, tympanic membrane, ear canal and external ear normal. Tympanic membrane is not erythematous, retracted or bulging.     Left Ear: Hearing, tympanic membrane, ear canal and external ear normal. Tympanic membrane is not erythematous, retracted or bulging.     Nose: No mucosal edema or rhinorrhea.     Right Sinus: No maxillary sinus tenderness or frontal sinus tenderness.     Left Sinus: No maxillary sinus tenderness or frontal sinus tenderness.     Mouth/Throat:     Pharynx: Uvula midline.  Eyes:     General: Lids are normal. Lids are everted, no foreign bodies appreciated.     Conjunctiva/sclera: Conjunctivae normal.     Pupils: Pupils are equal, round, and reactive to light.  Neck:     Thyroid: No thyroid mass or thyromegaly.     Vascular: No carotid bruit.     Trachea: Trachea normal.  Cardiovascular:     Rate and Rhythm: Normal rate and regular rhythm.     Pulses: Normal pulses.     Heart sounds: Normal heart sounds, S1 normal and S2 normal. No murmur heard.   No friction rub. No gallop.  Pulmonary:     Effort: Pulmonary effort is normal. No tachypnea or respiratory distress.     Breath sounds: Normal breath sounds. No decreased breath sounds, wheezing, rhonchi or rales.  Abdominal:     General: Bowel sounds are normal.     Palpations: Abdomen is soft.     Tenderness: There is no abdominal tenderness.  Musculoskeletal:     Cervical back: Normal range of motion and neck supple.  Lymphadenopathy:     Head:     Right side of head: No submental, submandibular, tonsillar, preauricular, posterior auricular or occipital adenopathy.     Left side of head: No submental, submandibular, tonsillar, preauricular, posterior auricular or occipital adenopathy.     Cervical:  Cervical adenopathy present.     Right cervical: Superficial cervical adenopathy present. No deep cervical adenopathy.    Left cervical: No superficial, deep or posterior cervical adenopathy.     Upper Body:     Right upper body: No supraclavicular adenopathy.     Left upper body: No  supraclavicular adenopathy.  Skin:    General: Skin is warm and dry.     Findings: No rash.  Neurological:     Mental Status: She is alert.  Psychiatric:        Mood and Affect: Mood is not anxious or depressed.        Speech: Speech normal.        Behavior: Behavior normal. Behavior is cooperative.        Thought Content: Thought content normal.        Judgment: Judgment normal.      Results for orders placed or performed in visit on 10/08/20  TSH  Result Value Ref Range   TSH 1.28 0.35 - 4.50 uIU/mL  CBC with Differential/Platelet  Result Value Ref Range   WBC 6.3 4.0 - 10.5 K/uL   RBC 4.58 3.87 - 5.11 Mil/uL   Hemoglobin 14.2 12.0 - 15.0 g/dL   HCT 41.8 36.0 - 46.0 %   MCV 91.3 78.0 - 100.0 fl   MCHC 34.0 30.0 - 36.0 g/dL   RDW 13.2 11.5 - 15.5 %   Platelets 274.0 150.0 - 400.0 K/uL   Neutrophils Relative % 56.9 43.0 - 77.0 %   Lymphocytes Relative 33.0 12.0 - 46.0 %   Monocytes Relative 7.0 3.0 - 12.0 %   Eosinophils Relative 2.7 0.0 - 5.0 %   Basophils Relative 0.4 0.0 - 3.0 %   Neutro Abs 3.6 1.4 - 7.7 K/uL   Lymphs Abs 2.1 0.7 - 4.0 K/uL   Monocytes Absolute 0.4 0.1 - 1.0 K/uL   Eosinophils Absolute 0.2 0.0 - 0.7 K/uL   Basophils Absolute 0.0 0.0 - 0.1 K/uL  T3, free  Result Value Ref Range   T3, Free 2.8 2.3 - 4.2 pg/mL  T4, free  Result Value Ref Range   Free T4 0.67 0.60 - 1.60 ng/dL  Vitamin B12  Result Value Ref Range   Vitamin B-12 206 (L) 211 - 911 pg/mL  Hemoglobin A1c  Result Value Ref Range   Hgb A1c MFr Bld 5.8 4.6 - 6.5 %  Lipid panel  Result Value Ref Range   Cholesterol 234 (H) 0 - 200 mg/dL   Triglycerides 234.0 (H) 0.0 - 149.0 mg/dL   HDL 62.80 >39.00  mg/dL   VLDL 46.8 (H) 0.0 - 40.0 mg/dL   Total CHOL/HDL Ratio 4    NonHDL 171.05   Comprehensive metabolic panel  Result Value Ref Range   Sodium 138 135 - 145 mEq/L   Potassium 4.1 3.5 - 5.1 mEq/L   Chloride 100 96 - 112 mEq/L   CO2 29 19 - 32 mEq/L   Glucose, Bld 91 70 - 99 mg/dL   BUN 19 6 - 23 mg/dL   Creatinine, Ser 0.80 0.40 - 1.20 mg/dL   Total Bilirubin 0.5 0.2 - 1.2 mg/dL   Alkaline Phosphatase 55 39 - 117 U/L   AST 34 0 - 37 U/L   ALT 51 (H) 0 - 35 U/L   Total Protein 7.2 6.0 - 8.3 g/dL   Albumin 4.8 3.5 - 5.2 g/dL   GFR 84.40 >60.00 mL/min   Calcium 9.8 8.4 - 10.5 mg/dL  LDL cholesterol, direct  Result Value Ref Range   Direct LDL 142.0 mg/dL    This visit occurred during the SARS-CoV-2 public health emergency.  Safety protocols were in place, including screening questions prior to the visit, additional usage of staff PPE, and extensive cleaning of exam room while observing appropriate  contact time as indicated for disinfecting solutions.   COVID 19 screen:  No recent travel or known exposure to COVID19 The patient denies respiratory symptoms of COVID 19 at this time. The importance of social distancing was discussed today.   Assessment and Plan Problem List Items Addressed This Visit     Bilateral hand pain    OA vs. Carpal tunnel syndrome  Increase Voltaren gel to four times daily.  Wear carpal tunnel brace on right arm at night for 3-4 week.       Cervical lymphadenopathy    Will broaden antibiotics. If not resolving will refer to ENT Complete Augmentin x 10 days.      Current moderate episode of major depressive disorder without prior episode ([place]) - Primary    Chronic, poor control. Start  Lexapro 5 mg daily x 1 week, then increase to 10 mg daily.  Follow up in 4 weeks.       Relevant Medications   ALPRAZolam (XANAX) 0.5 MG tablet   Generalized anxiety disorder    Chronic, poor control. Start  Lexapro 5 mg daily x 1 week, then increase to 10  mg daily.  Follow up in 4 weeks.  Refilled alprazolam to use prn.      Relevant Medications   ALPRAZolam (XANAX) 0.5 MG tablet   Meds ordered this encounter  Medications   DISCONTD: escitalopram (LEXAPRO) 10 MG tablet    Sig: Take 1 tablet (10 mg total) by mouth daily.    Dispense:  30 tablet    Refill:  3   amoxicillin-clavulanate (AUGMENTIN) 875-125 MG tablet    Sig: Take 1 tablet by mouth 2 (two) times daily.    Dispense:  20 tablet    Refill:  0   ALPRAZolam (XANAX) 0.5 MG tablet    Sig: Take 0.5-1 tablets (0.25-0.5 mg total) by mouth daily as needed for anxiety.    Dispense:  30 tablet    Refill:  0       Rebecca Lofts, MD

## 2021-03-20 ENCOUNTER — Telehealth: Payer: Self-pay

## 2021-03-20 ENCOUNTER — Other Ambulatory Visit: Payer: Self-pay | Admitting: Obstetrics and Gynecology

## 2021-03-20 DIAGNOSIS — E8941 Symptomatic postprocedural ovarian failure: Secondary | ICD-10-CM

## 2021-03-20 MED ORDER — ESTRADIOL 0.05 MG/24HR TD PTTW: MEDICATED_PATCH | TRANSDERMAL | 0 refills | Status: DC

## 2021-03-20 NOTE — Telephone Encounter (Signed)
Pt needs RF on estradiol patch, is completely out. Is currently in [place], daughter is in hospice. Annual scheduled. If you can pls send Rx to [facility] in [place] (updated in pharmacies).

## 2021-03-20 NOTE — Telephone Encounter (Signed)
Rx RF eRxd,

## 2021-03-20 NOTE — Progress Notes (Signed)
Rx RF estradiol patch till 9/22 annual

## 2021-03-21 NOTE — Telephone Encounter (Signed)
Called pt, no answer, LVMTRC. 

## 2021-03-21 NOTE — Telephone Encounter (Signed)
Tried again, no answer, did not leave voice msg this time.

## 2021-03-22 ENCOUNTER — Other Ambulatory Visit: Payer: Self-pay | Admitting: Family Medicine

## 2021-03-28 ENCOUNTER — Ambulatory Visit: Payer: 59 | Admitting: Family Medicine

## 2021-04-22 DIAGNOSIS — M79641 Pain in right hand: Secondary | ICD-10-CM | POA: Insufficient documentation

## 2021-04-22 DIAGNOSIS — M79642 Pain in left hand: Secondary | ICD-10-CM | POA: Insufficient documentation

## 2021-04-22 NOTE — Assessment & Plan Note (Signed)
Will broaden antibiotics. If not resolving will refer to ENT Complete Augmentin x 10 days.

## 2021-04-22 NOTE — Assessment & Plan Note (Addendum)
OA vs. Carpal tunnel syndrome  Increase Voltaren gel to four times daily.  Wear carpal tunnel brace on right arm at night for 3-4 week.

## 2021-04-22 NOTE — Assessment & Plan Note (Signed)
Chronic, poor control. Start  Lexapro 5 mg daily x 1 week, then increase to 10 mg daily.  Follow up in 4 weeks.

## 2021-04-22 NOTE — Assessment & Plan Note (Addendum)
Chronic, poor control. Start  Lexapro 5 mg daily x 1 week, then increase to 10 mg daily.  Follow up in 4 weeks.  Refilled alprazolam to use prn.

## 2021-05-06 ENCOUNTER — Other Ambulatory Visit (HOSPITAL_COMMUNITY)
Admission: RE | Admit: 2021-05-06 | Discharge: 2021-05-06 | Disposition: A | Discharge status: Home / Self Care | Payer: 59 | Source: Ambulatory Visit | Attending: Obstetrics and Gynecology | Admitting: Obstetrics and Gynecology

## 2021-05-06 ENCOUNTER — Other Ambulatory Visit: Payer: Self-pay

## 2021-05-06 ENCOUNTER — Ambulatory Visit (INDEPENDENT_AMBULATORY_CARE_PROVIDER_SITE_OTHER): Payer: 59 | Admitting: Obstetrics and Gynecology

## 2021-05-06 ENCOUNTER — Encounter: Payer: Self-pay | Admitting: Obstetrics and Gynecology

## 2021-05-06 VITALS — BP 110/70 | HR 69 | Temp 98.0°F | Resp 16 | Ht 65.0 in | Wt 179.2 lb

## 2021-05-06 DIAGNOSIS — R109 Unspecified abdominal pain: Secondary | ICD-10-CM

## 2021-05-06 DIAGNOSIS — Z124 Encounter for screening for malignant neoplasm of cervix: Secondary | ICD-10-CM | POA: Diagnosis present

## 2021-05-06 DIAGNOSIS — R350 Frequency of micturition: Secondary | ICD-10-CM | POA: Diagnosis not present

## 2021-05-06 DIAGNOSIS — Z01419 Encounter for gynecological examination (general) (routine) without abnormal findings: Secondary | ICD-10-CM

## 2021-05-06 DIAGNOSIS — N200 Calculus of kidney: Secondary | ICD-10-CM

## 2021-05-06 DIAGNOSIS — E8941 Symptomatic postprocedural ovarian failure: Secondary | ICD-10-CM

## 2021-05-06 DIAGNOSIS — Z1211 Encounter for screening for malignant neoplasm of colon: Secondary | ICD-10-CM

## 2021-05-06 DIAGNOSIS — Z1231 Encounter for screening mammogram for malignant neoplasm of breast: Secondary | ICD-10-CM | POA: Diagnosis not present

## 2021-05-06 DIAGNOSIS — Z79890 Hormone replacement therapy (postmenopausal): Secondary | ICD-10-CM

## 2021-05-06 LAB — POCT URINALYSIS DIPSTICK
Bilirubin, UA: NEGATIVE
Blood, UA: NEGATIVE
Glucose, UA: NEGATIVE
Ketones, UA: NEGATIVE
Leukocytes, UA: NEGATIVE
Nitrite, UA: NEGATIVE
Protein, UA: NEGATIVE
Spec Grav, UA: <=1.005 — AB (ref 1.010–1.025)
Urobilinogen, UA: 0.2 E.U./dL
pH, UA: 7.5 (ref 5.0–8.0)

## 2021-05-06 MED ORDER — HYDROCODONE-ACETAMINOPHEN 5-325 MG PO TABS: 1.0000 | ORAL_TABLET | Freq: Four times a day (QID) | ORAL | 0 refills | Status: AC | PRN

## 2021-05-06 MED ORDER — ESTRADIOL 0.05 MG/24HR TD PTTW: MEDICATED_PATCH | TRANSDERMAL | 3 refills | Status: DC

## 2021-05-06 NOTE — Patient Instructions (Signed)
I value your feedback and you entrusting us with your care. If you get a [facility] patient survey, I would appreciate you taking the time to let us know about your experience today. Thank you!  [facility]: [phone]      

## 2021-05-06 NOTE — Progress Notes (Signed)
PCP: Jinny Sanders, MD   Chief Complaint  Patient presents with   Annual Exam    Patient presents today for her annual physical, patient reports that she feels poorly today and has concerns  she would like to address. Patient reports for the past two days or more she has been experiencing flank pain on her right side, nausea and frequency of urination. Patient denies fever, dysuria, hematuria or vomiting. Patient reports past medical history of kidney stones.      HPI:      [name] is a 55 y.o. [phone] whose LMP was No LMP recorded. Patient has had a hysterectomy., presents today for her annual examination.  Her menses are absent due to [facility]. She does have vasomotor sx. Is on estradiol patch 0.05 mg twice wkly with sx control. Sx increase if she tries to wean down.   Sex activity: single partner, contraception - status post hysterectomy. She does not have vaginal dryness.   Last Pap: 07/22/18  Results were: unsatisfactory/neg HPV DNA.   Last mammogram: 06/02/18  Results were: Cat 3 LT breast. Pt had LT breast f/u 4/20 which was stable. Repeat bilat mammo due after 6 months, not done  There is no FH of breast cancer. There is no FH of ovarian cancer. The patient does do self-breast exams.  Colonoscopy: ~10 yrs ago per pt report (I can't find records of it);  Repeat due after 10 years.  Had upper endoscopy with Dr. Alice Reichert in 2019 and likes him.  Tobacco use: smokes about 1/3 to 1/4 ppd, trying to quit. Didn't like nicotine patch. Alcohol use: none  No drug use Exercise: moderately active  She does get adequate calcium and Vitamin D in her diet.  Labs with PCP.  Hx of kidney stone in LT kidney 11/21, s/p lithotripsy; managed by [facility]. Pt developed RT kidney pain yesterday AM that feels similar. Cant sleep or get comfortable. Had neg renal u/s RT kidney 1/22.    Past Medical History:  Diagnosis Date   Depression    GERD (gastroesophageal reflux  disease)    History of kidney stones    Hyperlipidemia    Kidney stone    Pneumonia    Treadmill stress test negative for angina pectoris 12/18/2010    Past Surgical History:  Procedure Laterality Date   ABDOMINAL HYSTERECTOMY  2001   with 1 ovary removed; remaining over removed a yr later   CHOLECYSTECTOMY  2002   [place] LITHOTRIPSY Left 06/27/2020   Procedure: EXTRACORPOREAL SHOCK WAVE LITHOTRIPSY (ESWL);  Surgeon: Abbie Sons, MD;  Location: [facility] ORS;  Service: Urology;  Laterality: Left;   UPPER GI ENDOSCOPY  2019    Family History  Problem Relation Age of Onset   COPD Mother    Heart attack Father 21   Hypertension Father    Heart disease Father        stent placement    Leukemia Brother    Kidney failure Maternal Grandfather    COPD Paternal Grandmother    Congenital heart disease Sister    Breast cancer Neg Hx     Social History   Socioeconomic History   Marital status: Married    Spouse name: Mia Creek   Number of children: Not on file   Years of education: Not on file   Highest education level: Not on file  Occupational History   Occupation: Physiological scientist  Employer: [employer]    Comment: Urgent Care in [place]  Tobacco Use   Smoking status: Every Day    Packs/day: 0.25    Years: 30.00    Pack years: 7.50    Types: Cigarettes   Smokeless tobacco: Never  Vaping Use   Vaping Use: Never used  Substance and Sexual Activity   Alcohol use: Yes    Alcohol/week: 2.0 standard drinks    Types: 1 Glasses of wine, 1 Cans of beer per week    Comment: beer ocassionally   Drug use: No   Sexual activity: Yes    Birth control/protection: Surgical    Comment: Hsyterectomy  Other Topics Concern   Not on file  Social History Narrative   Regular exercise- yes, occ walking   Diet- fruits and veggies, water    Social Determinants of Health   Financial Resource Strain: Not on file  Food Insecurity: Not on file   Transportation Needs: Not on file  Physical Activity: Not on file  Stress: Not on file  Social Connections: Not on file  Intimate Partner Violence: Not on file     Current Outpatient Medications:    ALPRAZolam (XANAX) 0.5 MG tablet, Take 0.5-1 tablets (0.25-0.5 mg total) by mouth daily as needed for anxiety., Disp: 30 tablet, Rfl: 0   aspirin 81 MG chewable tablet, Chew 81 mg by mouth daily., Disp: , Rfl:    BIOTIN PO, Take 1 tablet by mouth daily., Disp: , Rfl:    BLACK CURRANT SEED OIL PO, Take 5 mLs by mouth daily., Disp: , Rfl:    calcium carbonate (TUMS - DOSED IN MG ELEMENTAL CALCIUM) 500 MG chewable tablet, Chew 6 tablets by mouth daily as needed for indigestion or heartburn., Disp: , Rfl:    Cholecalciferol (VITAMIN D3) 25 MCG (1000 UT) CAPS, Take by mouth., Disp: , Rfl:    escitalopram (LEXAPRO) 10 MG tablet, TAKE 1 TABLET BY MOUTH EVERY DAY, Disp: 90 tablet, Rfl: 0   MILK THISTLE PO, Take 1 tablet by mouth daily., Disp: , Rfl:    Misc Natural Products (ELDERBERRY ZINC/VIT C/IMMUNE MT), Use as directed 1 tablet in the mouth or throat daily., Disp: , Rfl:    pantoprazole (PROTONIX) 40 MG tablet, TAKE 1 TABLET BY MOUTH EVERY DAY, Disp: 90 tablet, Rfl: 3   amoxicillin-clavulanate (AUGMENTIN) 875-125 MG tablet, Take 1 tablet by mouth 2 (two) times daily., Disp: 20 tablet, Rfl: 0   estradiol (VIVELLE-DOT) 0.05 MG/24HR patch, PLACE 1 PATCH TRANS-DERMALLY TWICE A WEEK. REMOVE OLD PATCH BEFORE APPLYING NEW PATCH., Disp: 24 patch, Rfl: 3   HYDROcodone-acetaminophen (NORCO/VICODIN) 5-325 MG tablet, Take 1 tablet by mouth every 6 (six) hours as needed for up to 5 days for moderate pain., Disp: 20 tablet, Rfl: 0     ROS:  Review of Systems  Constitutional:  Negative for fatigue, fever and unexpected weight change.  Respiratory:  Negative for cough, shortness of breath and wheezing.   Cardiovascular:  Negative for chest pain, palpitations and leg swelling.  Gastrointestinal:  Positive  for nausea. Negative for blood in stool, constipation, diarrhea and vomiting.  Endocrine: Negative for cold intolerance, heat intolerance and polyuria.  Genitourinary:  Positive for frequency. Negative for dyspareunia, dysuria, flank pain, genital sores, hematuria, menstrual problem, pelvic pain, urgency, vaginal bleeding, vaginal discharge and vaginal pain.  Musculoskeletal:  Positive for back pain. Negative for arthralgias, joint swelling and myalgias.  Skin:  Negative for rash.  Neurological:  Positive for headaches. Negative for dizziness,  syncope, light-headedness and numbness.  Hematological:  Negative for adenopathy.  Psychiatric/Behavioral:  Positive for agitation. Negative for confusion, sleep disturbance and suicidal ideas. The patient is not nervous/anxious.  BREAST: No symptoms    Objective: BP 110/70   Pulse 69   Temp 98 F (36.7 C) (Oral)   Resp 16   Ht 5\' 5"  (1.651 m)   Wt 179 lb 3.2 oz (81.3 kg)   SpO2 98%   BMI 29.82 kg/m    Physical Exam Constitutional:      General: She is in acute distress.     Appearance: She is well-developed.     Comments: VERY UNCOMFORTABLE DUE TO RT KIDNEY PAIN  Genitourinary:     Vulva normal.     Genitourinary Comments: UTERUS/CX SURG REM     Right Labia: No rash, tenderness or lesions.    Left Labia: No tenderness, lesions or rash.    Vaginal cuff intact.    No vaginal discharge, erythema or tenderness.      Right Adnexa: absent.    Right Adnexa: not tender and no mass present.    Left Adnexa: absent.    Left Adnexa: not tender and no mass present.    Cervix is absent.     Uterus is absent.  Breasts:    Right: No mass, nipple discharge, skin change or tenderness.     Left: No mass, nipple discharge, skin change or tenderness.  Neck:     Thyroid: No thyromegaly.  Cardiovascular:     Rate and Rhythm: Normal rate and regular rhythm.     Heart sounds: Normal heart sounds. No murmur heard. Pulmonary:     Effort: Pulmonary  effort is normal.     Breath sounds: Normal breath sounds.  Abdominal:     Palpations: Abdomen is soft.     Tenderness: There is no abdominal tenderness. There is right CVA tenderness. There is no left CVA tenderness or guarding.  Musculoskeletal:        General: Normal range of motion.     Cervical back: Normal range of motion.  Neurological:     General: No focal deficit present.     Mental Status: She is alert and oriented to person, place, and time.     Cranial Nerves: No cranial nerve deficit.  Skin:    General: Skin is warm and dry.  Psychiatric:        Mood and Affect: Mood normal.        Behavior: Behavior normal.        Thought Content: Thought content normal.        Judgment: Judgment normal.  Vitals reviewed.   Results for orders placed or performed in visit on 05/06/21 (from the past 24 hour(s))  POCT urinalysis dipstick     Status: Abnormal   Collection Time: 05/06/21 11:21 AM  Result Value Ref Range   Color, UA yellow    Clarity, UA clear    Glucose, UA Negative Negative   Bilirubin, UA negative    Ketones, UA negative    Spec Grav, UA <=1.005 (A) 1.010 - 1.025   Blood, UA negative    pH, UA 7.5 5.0 - 8.0   Protein, UA Negative Negative   Urobilinogen, UA 0.2 0.2 or 1.0 E.U./dL   Nitrite, UA negative    Leukocytes, UA Negative Negative   Appearance     Odor      Assessment/Plan: Encounter for annual routine gynecological examination  Cervical cancer screening -  Plan: Cytology - PAP  Encounter for screening mammogram for malignant neoplasm of breast - Plan: MM 3D SCREEN BREAST BILATERAL; pt to sched mammo  Screening for colon cancer - Plan: Ambulatory referral to Gastroenterology; refer to [facility]. Has seen Dr. Alice Reichert in past for EGD  Hot flashes due to surgical menopause - Plan: estradiol (VIVELLE-DOT) 0.05 MG/24HR patch; Rx RF. Doing well, can cont dose.   Hormone replacement therapy (HRT) - Plan: estradiol (VIVELLE-DOT) 0.05 MG/24HR patch  Urinary  frequency - Plan: POCT urinalysis dipstick; neg UA. Sx most likely related to kidney stone.   Flank pain - Plan: HYDROcodone-acetaminophen (NORCO/VICODIN) 5-325 MG tablet; most likely kidney stone in RT kidney now. Rx vicodin for pain, pt to call [facility] urology for f/u.    Meds ordered this encounter  Medications   HYDROcodone-acetaminophen (NORCO/VICODIN) 5-325 MG tablet    Sig: Take 1 tablet by mouth every 6 (six) hours as needed for up to 5 days for moderate pain.    Dispense:  20 tablet    Refill:  0    Order Specific Question:   Supervising Provider    Answer:   Gae Dry [[id number]]   estradiol (VIVELLE-DOT) 0.05 MG/24HR patch    Sig: PLACE 1 PATCH TRANS-DERMALLY TWICE A WEEK. REMOVE OLD PATCH BEFORE APPLYING NEW PATCH.    Dispense:  24 patch    Refill:  3    Order Specific Question:   Supervising Provider    Answer:   Gae Dry [[id number]]            GYN counsel breast self exam, mammography screening, use and side effects of HRT, menopause, adequate intake of calcium and vitamin D, diet and exercise    F/U  Return in about 1 year (around 05/06/2022).  Conny Situ B. Jozey Janco, PA-C 05/06/2021 1:46 PM

## 2021-05-07 ENCOUNTER — Ambulatory Visit
Admission: RE | Admit: 2021-05-07 | Discharge: 2021-05-07 | Disposition: A | Discharge status: Home / Self Care | Payer: 59 | Attending: Urology | Admitting: Urology

## 2021-05-07 ENCOUNTER — Ambulatory Visit
Admission: RE | Admit: 2021-05-07 | Discharge: 2021-05-07 | Disposition: A | Discharge status: Home / Self Care | Payer: 59 | Source: Ambulatory Visit | Attending: Urology | Admitting: Urology

## 2021-05-07 DIAGNOSIS — N200 Calculus of kidney: Secondary | ICD-10-CM | POA: Diagnosis present

## 2021-05-07 NOTE — Progress Notes (Signed)
05/12/21 4:13 PM   Rebecca Allen [birth year] [id number]  Referring provider:  Jinny Sanders, MD [address] [place],  [place] [address]  Chief Complaint  Patient presents with   Nephrolithiasis    Urological history  1. Nephrolithiasis  -Stone composition of 70% calcium oxalate dihydrate, 25% calcium monohydrate and 5% hydroxyapatite - S/p ESWL with Dr. Bernardo Heater on 06/27/2020 for 6 mm left UPJ stone with subsequent development of a left subcapsular hematoma - RUS dated 08/29/2020 notable for stable appearance of the left lower pole subcapsular hematoma.  2. Hematoma of left kidney -resolved on CT renal stone study 04/2021   HPI: Rebecca Allen is a 55 y.o.female who pesents today for further evaluation of right flank pain.   KUB on 05/07/2021 was negative for stone.   She reports today that she is experiencing right flank pain. Her pain lessens with standing, heat compress, and walking. It worsens when she is sitting. She has experience waxing/waning nausea.  Patient denies any dysuria, suprapelvic pain or radiation of the pain.  gross hematuria.  Patient denies any fevers, chills, and vomiting.   UA > 10 squames and moderate bacteria.  STAT renal stone study 05/08/2021 - negative    PMH: Past Medical History:  Diagnosis Date   Depression    GERD (gastroesophageal reflux disease)    History of kidney stones    Hyperlipidemia    Kidney stone    Pneumonia    Treadmill stress test negative for angina pectoris 12/18/2010    Surgical History: Past Surgical History:  Procedure Laterality Date   ABDOMINAL HYSTERECTOMY  2001   with 1 ovary removed; remaining over removed a yr later   CHOLECYSTECTOMY  2002   [place] LITHOTRIPSY Left 06/27/2020   Procedure: EXTRACORPOREAL SHOCK WAVE LITHOTRIPSY (ESWL);  Surgeon: Abbie Sons, MD;  Location: [facility] ORS;  Service: Urology;  Laterality: Left;   UPPER GI ENDOSCOPY   2019    Home Medications:  Allergies as of 05/08/2021       Reactions   Metoclopramide Hcl    REACTION: Nervousness   Morphine And Related Nausea Only   Venlafaxine Other (See Comments)   Hot flashes, clammy and nausea        Medication List        Accurate as of May 08, 2021 11:59 PM. If you have any questions, ask your nurse or doctor.          STOP taking these medications    amoxicillin-clavulanate 875-125 MG tablet Commonly known as: AUGMENTIN Stopped by: Brayli Klingbeil, PA-C   escitalopram 10 MG tablet Commonly known as: LEXAPRO Stopped by: Zara Council, PA-C       TAKE these medications    ALPRAZolam 0.5 MG tablet Commonly known as: XANAX Take 0.5-1 tablets (0.25-0.5 mg total) by mouth daily as needed for anxiety.   aspirin 81 MG chewable tablet Chew 81 mg by mouth daily.   BIOTIN PO Take 1 tablet by mouth daily.   BLACK CURRANT SEED OIL PO Take 5 mLs by mouth daily.   calcium carbonate 500 MG chewable tablet Commonly known as: TUMS - dosed in mg elemental calcium Chew 6 tablets by mouth daily as needed for indigestion or heartburn.   ELDERBERRY ZINC/VIT C/IMMUNE MT Use as directed 1 tablet in the mouth or throat daily.   estradiol 0.05 MG/24HR patch Commonly known as: VIVELLE-DOT PLACE 1 PATCH TRANS-DERMALLY TWICE A WEEK. REMOVE OLD  PATCH BEFORE APPLYING NEW PATCH.   HYDROcodone-acetaminophen 5-325 MG tablet Commonly known as: NORCO/VICODIN Take 1 tablet by mouth every 6 (six) hours as needed for up to 5 days for moderate pain.   MILK THISTLE PO Take 1 tablet by mouth daily.   pantoprazole 40 MG tablet Commonly known as: PROTONIX TAKE 1 TABLET BY MOUTH EVERY DAY   Vitamin D3 25 MCG (1000 UT) Caps Take by mouth.        Allergies:  Allergies  Allergen Reactions   Metoclopramide Hcl     REACTION: Nervousness   Morphine And Related Nausea Only   Venlafaxine Other (See Comments)    Hot flashes, clammy and nausea     Family History: Family History  Problem Relation Age of Onset   COPD Mother    Heart attack Father 16   Hypertension Father    Heart disease Father        stent placement    Leukemia Brother    Kidney failure Maternal Grandfather    COPD Paternal Grandmother    Congenital heart disease Sister    Breast cancer Neg Hx     Social History:  reports that she has been smoking cigarettes. She has a 7.50 pack-year smoking history. She has never used smokeless tobacco. She reports current alcohol use of about 2.0 standard drinks per week. She reports that she does not use drugs.   Physical Exam: BP 102/65   Pulse 92   Ht 5\' 5"  (1.651 m)   Wt 179 lb (81.2 kg)   BMI 29.79 kg/m   Constitutional:  Alert and oriented, No acute distress. HEENT: [place] AT, mask in place.  Trachea midline Cardiovascular: No clubbing, cyanosis, or edema. Respiratory: Normal respiratory effort, no increased work of breathing. Neurologic: Grossly intact, no focal deficits, moving all 4 extremities. Psychiatric: Normal mood and affect.  Laboratory Data: Lab Results  Component Value Date   CREATININE 0.80 10/08/2020   Component     Latest Ref Rng & Units 10/08/2020  Direct LDL     mg/dL 142.0   Component     Latest Ref Rng & Units 10/08/2020  Sodium     135 - 145 mEq/L 138  Potassium     3.5 - 5.1 mEq/L 4.1  Chloride     96 - 112 mEq/L 100  CO2     19 - 32 mEq/L 29  Glucose     70 - 99 mg/dL 91  BUN     6 - 23 mg/dL 19  Creatinine     0.40 - 1.20 mg/dL 0.80  Total Bilirubin     0.2 - 1.2 mg/dL 0.5  Alkaline Phosphatase     39 - 117 U/L 55  AST     0 - 37 U/L 34  ALT     0 - 35 U/L 51 (H)  Total Protein     6.0 - 8.3 g/dL 7.2  Albumin     3.5 - 5.2 g/dL 4.8  GFR     >60.00 mL/min 84.40  Calcium     8.4 - 10.5 mg/dL 9.8   Component     Latest Ref Rng & Units 10/08/2020  Cholesterol     0 - 200 mg/dL 234 (H)  Triglycerides     0.0 - 149.0 mg/dL 234.0 (H)  HDL Cholesterol      >39.00 mg/dL 62.80  VLDL     0.0 - 40.0 mg/dL 46.8 (H)  Total CHOL/HDL Ratio  4  NonHDL      171.05    Lab Results  Component Value Date   HGBA1C 5.8 10/08/2020    Urinalysis Component     Latest Ref Rng & Units 05/06/2021  Color, UA      yellow  Clarity, UA      clear  Glucose     Negative Negative  Bilirubin, UA      negative  Ketones, UA      negative  Specific Gravity, UA     1.010 - 1.025 <=1.005 (A)  RBC, UA      negative  pH, UA     5.0 - 8.0 7.5  Protein,UA     Negative Negative  Urobilinogen, UA     0.2 or 1.0 E.U./dL 0.2  Nitrite, UA      negative  Leukocytes,UA     Negative Negative  Appearance     CLEAR    Component     Latest Ref Rng & Units 05/08/2021          WBC, UA     0 - 5 /hpf 0-5  RBC     0 - 2 /hpf 0-2  Epithelial Cells (non renal)     0 - 10 /hpf >10 (A)  Bacteria, UA     None seen/Few Moderate (A)  I have reviewed the labs.    Pertinent Imaging: CLINICAL DATA:  Right flank pain   EXAM: ABDOMEN - 1 VIEW   COMPARISON:  CT today   FINDINGS: Prior cholecystectomy. There is a non obstructive bowel gas pattern. No supine evidence of free air. No organomegaly or suspicious calcification. No acute bony abnormality.   IMPRESSION: Negative.     Electronically Signed   By: Rolm Baptise M.D.   On: 05/08/2021 15:18  CLINICAL DATA:  Right flank pain   EXAM: CT ABDOMEN AND PELVIS WITHOUT CONTRAST   TECHNIQUE: Multidetector CT imaging of the abdomen and pelvis was performed following the standard protocol without IV contrast.   COMPARISON:  06/28/2020   FINDINGS: Lower chest: No acute abnormality.   Hepatobiliary: Prior cholecystectomy. Diffuse fatty infiltration of the liver. No focal hepatic abnormality.   Pancreas: No focal abnormality or ductal dilatation.   Spleen: No focal abnormality.  Normal size.   Adrenals/Urinary Tract: No adrenal abnormality. No focal renal abnormality. No stones or  hydronephrosis. Urinary bladder is unremarkable.   Stomach/Bowel: Normal appendix. Stomach, large and small bowel grossly unremarkable.   Vascular/Lymphatic: Scattered aortic calcifications. No evidence of aneurysm or adenopathy.   Reproductive: Prior hysterectomy.  No adnexal masses.   Other: No free fluid or free air.   Musculoskeletal: No acute bony abnormality.   IMPRESSION: No renal or ureteral stones.  No hydronephrosis.   Hepatic steatosis.   Aortic atherosclerosis.     Electronically Signed   By: Rolm Baptise M.D.   On: 05/08/2021 15:17 I have independently reviewed the films.  See HPI.    Assessment & Plan:    Right flank pain  - KUB  negative for stone burden  - UA today was unremarkable  - Urine sent for a culture for thoroughness  - STAT CT renal stone study negative for stones -advised patient to follow up with PCP to rule out MSK etiology  Return if symptoms worsen or fail to improve.  [place] [address] [place], [place] [address] [phone]  [facility] as a scribe for [facility], PA-C.,have documented all  relevant documentation on the behalf of Latonia Conrow, PA-C,as directed by  [facility], PA-C while in the presence of Lameisha Schuenemann, PA-C.  I have reviewed the above documentation for accuracy and completeness, and I agree with the above.    Zara Council, PA-C   I spent 30 minutes on the day of the encounter to include pre-visit record review, face-to-face time with the patient, and post-visit ordering of tests.

## 2021-05-08 ENCOUNTER — Other Ambulatory Visit: Payer: Self-pay

## 2021-05-08 ENCOUNTER — Ambulatory Visit
Admission: RE | Admit: 2021-05-08 | Discharge: 2021-05-08 | Disposition: A | Discharge status: Home / Self Care | Payer: 59 | Source: Ambulatory Visit | Attending: Urology | Admitting: Urology

## 2021-05-08 ENCOUNTER — Encounter: Payer: Self-pay | Admitting: Urology

## 2021-05-08 ENCOUNTER — Ambulatory Visit: Payer: 59 | Admitting: Urology

## 2021-05-08 VITALS — BP 102/65 | HR 92 | Ht 65.0 in | Wt 179.0 lb

## 2021-05-08 DIAGNOSIS — R109 Unspecified abdominal pain: Secondary | ICD-10-CM

## 2021-05-08 DIAGNOSIS — N200 Calculus of kidney: Secondary | ICD-10-CM | POA: Diagnosis not present

## 2021-05-08 LAB — CYTOLOGY - PAP
Comment: NEGATIVE
Diagnosis: NEGATIVE
High risk HPV: NEGATIVE

## 2021-05-09 LAB — URINALYSIS, COMPLETE
Bilirubin, UA: NEGATIVE
Glucose, UA: NEGATIVE
Ketones, UA: NEGATIVE
Leukocytes,UA: NEGATIVE
Nitrite, UA: NEGATIVE
Protein,UA: NEGATIVE
RBC, UA: NEGATIVE
Specific Gravity, UA: 1.025 (ref 1.005–1.030)
Urobilinogen, Ur: 0.2 mg/dL (ref 0.2–1.0)
pH, UA: 6 (ref 5.0–7.5)

## 2021-05-09 LAB — MICROSCOPIC EXAMINATION: Epithelial Cells (non renal): >10 /hpf — AB (ref 0–10)

## 2021-05-19 LAB — CULTURE, URINE COMPREHENSIVE

## 2021-05-20 ENCOUNTER — Telehealth: Payer: Self-pay | Admitting: Urology

## 2021-05-20 NOTE — Telephone Encounter (Signed)
Pt returned call and I read message from [facility]

## 2021-06-19 ENCOUNTER — Encounter: Payer: Self-pay | Admitting: Obstetrics and Gynecology

## 2021-06-19 ENCOUNTER — Other Ambulatory Visit: Payer: Self-pay

## 2021-06-19 ENCOUNTER — Ambulatory Visit
Admission: RE | Admit: 2021-06-19 | Discharge: 2021-06-19 | Disposition: A | Discharge status: Home / Self Care | Payer: 59 | Source: Ambulatory Visit | Attending: Obstetrics and Gynecology | Admitting: Obstetrics and Gynecology

## 2021-06-19 DIAGNOSIS — Z1231 Encounter for screening mammogram for malignant neoplasm of breast: Secondary | ICD-10-CM | POA: Diagnosis present

## 2021-06-20 IMAGING — CR DG ABDOMEN 1V
2 series · 2 of 2 positions shown · non-contrast
Comparison: June 27, 2020 abdominal radiograph and CT abdomen
and pelvis June 28, 2020

CLINICAL DATA: Nephrolithiasis.  Recent lithotripsy

EXAM:
ABDOMEN - 1 VIEW

[abdomen kub (1 of 2)]
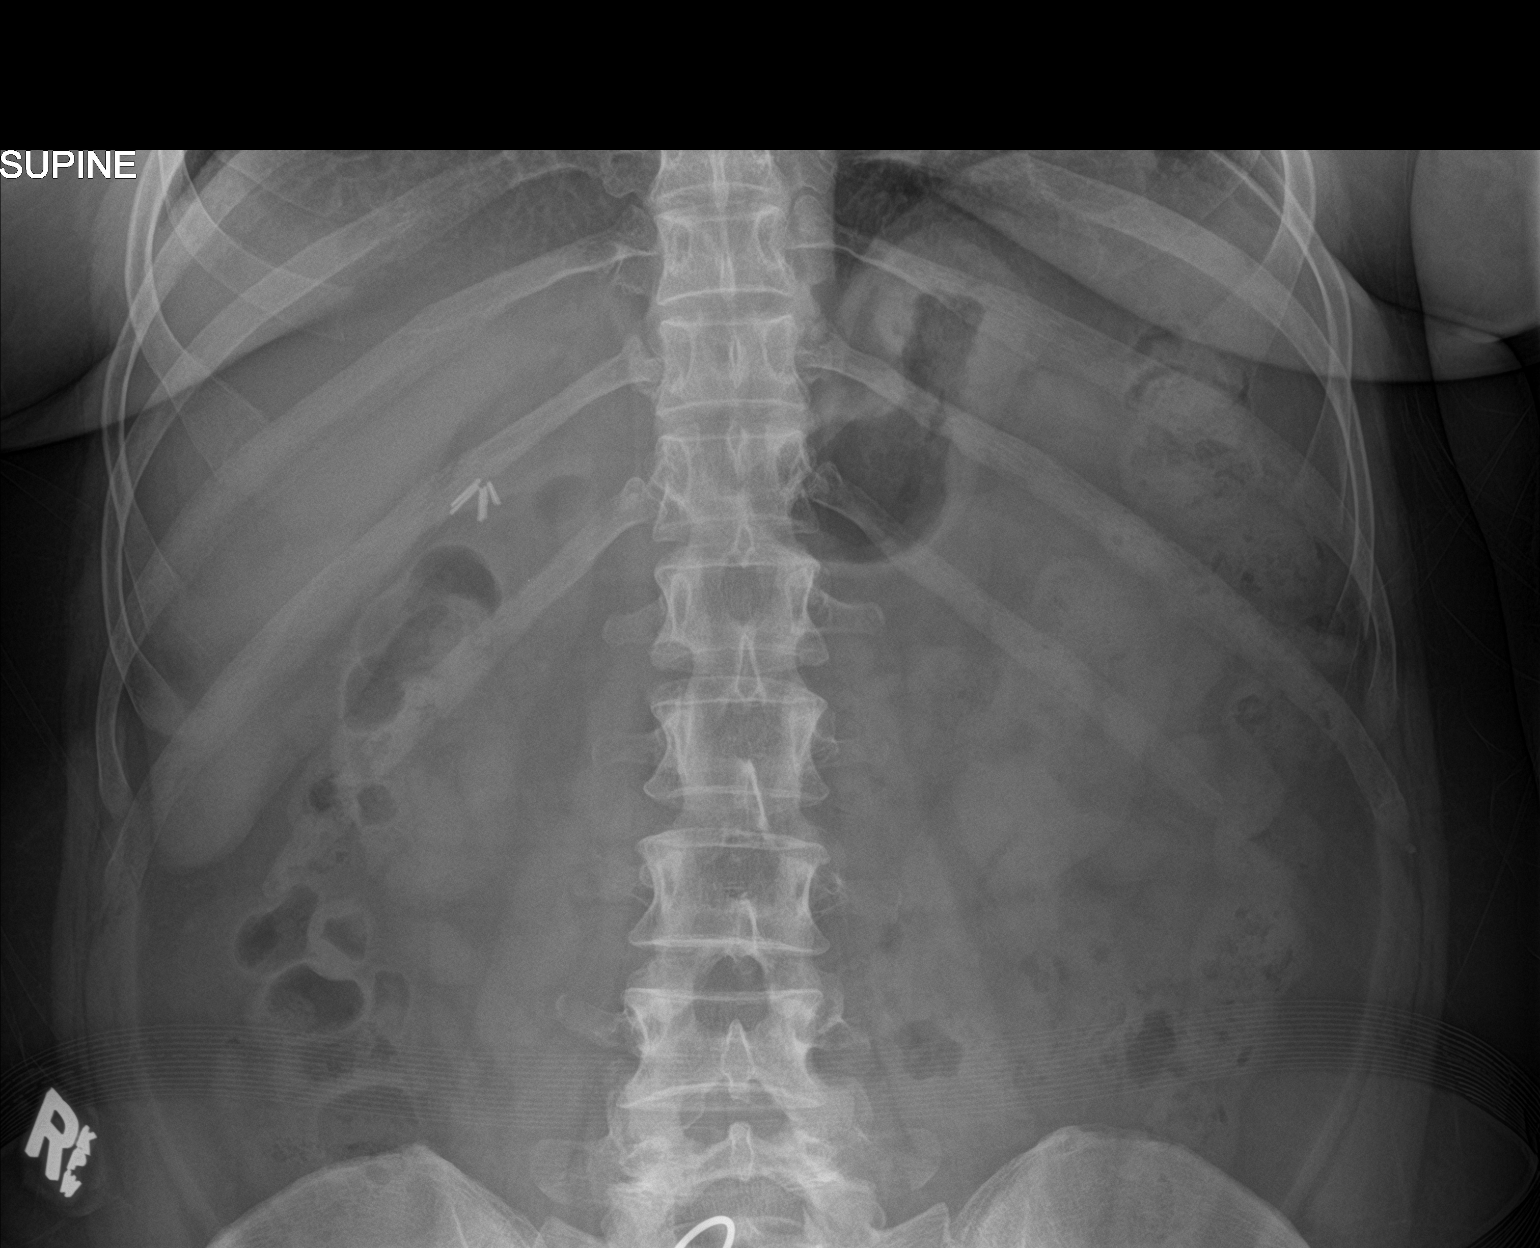

[abdomen kub (2 of 2)]
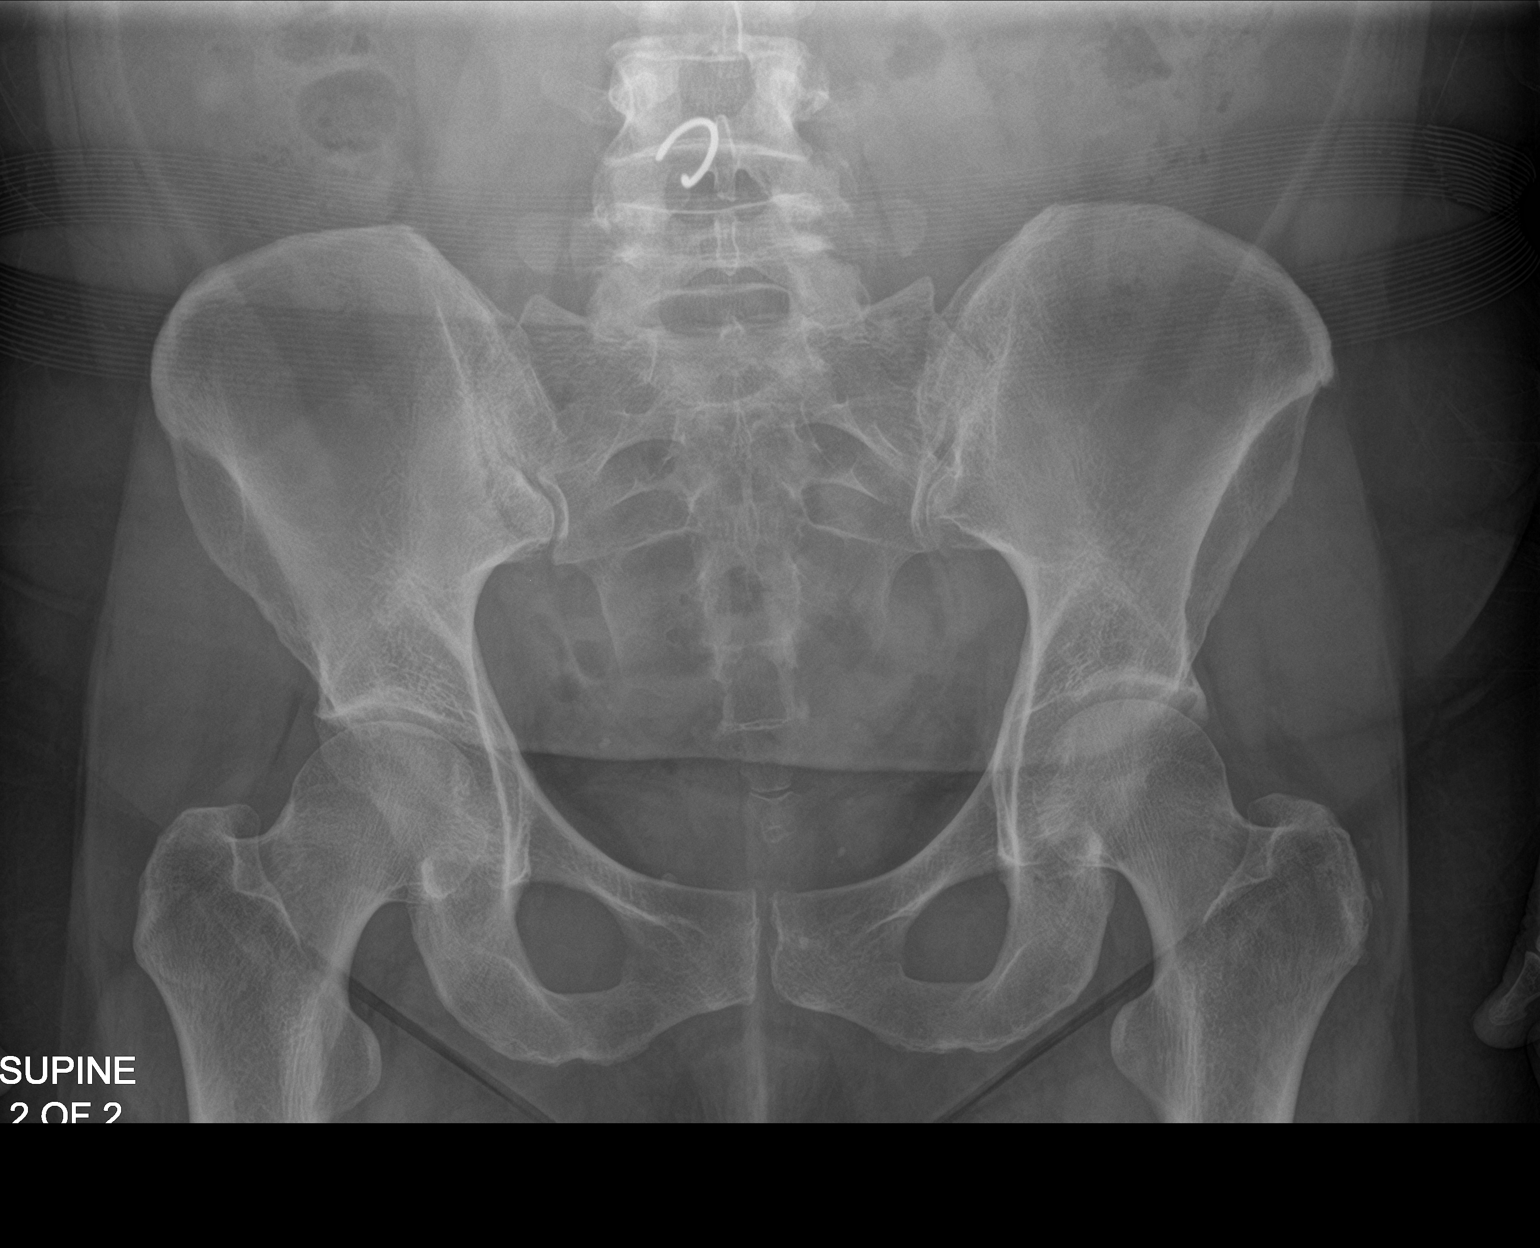

[2 of 2 positions shown; findings below may reference images not displayed]

FINDINGS: There are stable small pelvic calcifications, likely phleboliths.
Previously noted left ureteral calculus not appreciable. No new
calcifications evident. Moderate stool noted in colon. No bowel
dilatation or air-fluid level to suggest bowel obstruction. No free
air. Surgical clips in right upper quadrant. Lung bases clear.
IMPRESSION: Apparent phleboliths in the pelvis. No other abnormal calcifications
evident. No renal or ureteral calculus appreciable by radiography.

No bowel obstruction or free air.  Lung bases clear.

## 2021-06-24 ENCOUNTER — Ambulatory Visit: Payer: 59 | Admitting: Family Medicine

## 2021-06-24 ENCOUNTER — Encounter: Payer: Self-pay | Admitting: Family Medicine

## 2021-06-24 ENCOUNTER — Other Ambulatory Visit: Payer: Self-pay

## 2021-06-24 VITALS — BP 92/70 | HR 68 | Temp 97.6°F | Ht 65.0 in | Wt 176.4 lb

## 2021-06-24 DIAGNOSIS — F321 Major depressive disorder, single episode, moderate: Secondary | ICD-10-CM | POA: Diagnosis not present

## 2021-06-24 DIAGNOSIS — Z13220 Encounter for screening for lipoid disorders: Secondary | ICD-10-CM

## 2021-06-24 DIAGNOSIS — F411 Generalized anxiety disorder: Secondary | ICD-10-CM

## 2021-06-24 DIAGNOSIS — F4321 Adjustment disorder with depressed mood: Secondary | ICD-10-CM | POA: Insufficient documentation

## 2021-06-24 DIAGNOSIS — R799 Abnormal finding of blood chemistry, unspecified: Secondary | ICD-10-CM | POA: Diagnosis not present

## 2021-06-24 DIAGNOSIS — E782 Mixed hyperlipidemia: Secondary | ICD-10-CM

## 2021-06-24 DIAGNOSIS — F5104 Psychophysiologic insomnia: Secondary | ICD-10-CM

## 2021-06-24 DIAGNOSIS — F331 Major depressive disorder, recurrent, moderate: Secondary | ICD-10-CM

## 2021-06-24 DIAGNOSIS — R450 Nervousness: Secondary | ICD-10-CM | POA: Diagnosis not present

## 2021-06-24 LAB — COMPREHENSIVE METABOLIC PANEL
ALT: 37 U/L — ABNORMAL HIGH (ref 0–35)
AST: 24 U/L (ref 0–37)
Albumin: 5 g/dL (ref 3.5–5.2)
Alkaline Phosphatase: 51 U/L (ref 39–117)
BUN: 21 mg/dL (ref 6–23)
CO2: 29 mEq/L (ref 19–32)
Calcium: 10.2 mg/dL (ref 8.4–10.5)
Chloride: 101 mEq/L (ref 96–112)
Creatinine, Ser: 0.81 mg/dL (ref 0.40–1.20)
GFR: 82.74 mL/min (ref >60.00)
Glucose, Bld: 88 mg/dL (ref 70–99)
Potassium: 4.1 mEq/L (ref 3.5–5.1)
Sodium: 137 mEq/L (ref 135–145)
Total Bilirubin: 0.4 mg/dL (ref 0.2–1.2)
Total Protein: 7.6 g/dL (ref 6.0–8.3)

## 2021-06-24 LAB — LIPID PANEL
Cholesterol: 255 mg/dL — ABNORMAL HIGH (ref 0–200)
HDL: 54 mg/dL (ref >39.00)
NonHDL: 201
Total CHOL/HDL Ratio: 5
Triglycerides: 224 mg/dL — ABNORMAL HIGH (ref 0.0–149.0)
VLDL: 44.8 mg/dL — ABNORMAL HIGH (ref 0.0–40.0)

## 2021-06-24 LAB — LDL CHOLESTEROL, DIRECT: Direct LDL: 173 mg/dL

## 2021-06-24 MED ORDER — ALPRAZOLAM 0.5 MG PO TABS
0.5000 mg | ORAL_TABLET | Freq: Two times a day (BID) | ORAL | 0 refills | Status: DC | PRN
Start: 2021-06-24 — End: 2021-11-07

## 2021-06-24 MED ORDER — PANTOPRAZOLE SODIUM 40 MG PO TBEC: 40.0000 mg | DELAYED_RELEASE_TABLET | Freq: Every day | ORAL | 3 refills | Status: DC

## 2021-06-24 NOTE — Progress Notes (Signed)
Patient ID: Rebecca Allen, female    DOB: 07/30/1966, 55 y.o.   MRN: [id number]  This visit was conducted in person.  BP 92/70   Pulse 68   Temp 97.6 F (36.4 C) (Temporal)   Ht 5\' 5"  (1.651 m)   Wt 176 lb 6 oz (80 kg)   SpO2 98%   BMI 29.35 kg/m    CC:  Chief Complaint  Patient presents with   Fatigue    Recently lost daughter    Rebecca Allen    Subjective:   HPI: Rebecca Allen is a 55 y.o. female with history of GAD, MDD, chronic insomnia presenting on 06/24/2021 for Fatigue (Recently lost daughter/) and Rebecca Allen  She reports  her daughter passed away in 05-Apr-2021.. from drug overdose/ fentanyl was found in her system.  She had been doing well until this loss.  Since then she   She has noted she is using reading glassess more, she feels like sugar may be low.. she has shaking daily. Cannot sleep at night.Marland Kitchen 1-2 hours of sleep a night. Cannot urn of brain at night. She is very moody, irritable  She awakens feeling shaky and waek in AMs.  She is interested in seeing a counselor.      She has not been taking lexapro.. since April 06, 2023.  Using alprazolam 1/2 to 1 1/2 tablet depending on  stressful days while she was in [place] for funeral etc.  She is eating very small meals,   When she is at work she cannot forget, but when getting home it hits.    PHQ9: 14   GAD7: 18  Relevant past medical, surgical, family and social history reviewed and updated as indicated. Interim medical history since our last visit reviewed. Allergies and medications reviewed and updated. Outpatient Medications Prior to Visit  Medication Sig Dispense Refill   ALPRAZolam (XANAX) 0.5 MG tablet Take 0.5-1 tablets (0.25-0.5 mg total) by mouth daily as needed for anxiety. 30 tablet 0   aspirin 81 MG chewable tablet Chew 81 mg by mouth daily.     BIOTIN PO Take 1 tablet by mouth daily.     BLACK CURRANT SEED OIL PO Take 5 mLs by mouth daily.     calcium carbonate (TUMS - DOSED IN  MG ELEMENTAL CALCIUM) 500 MG chewable tablet Chew 6 tablets by mouth daily as needed for indigestion or heartburn.     Cholecalciferol (VITAMIN D3) 25 MCG (1000 UT) CAPS Take by mouth.     estradiol (VIVELLE-DOT) 0.05 MG/24HR patch PLACE 1 PATCH TRANS-DERMALLY TWICE A WEEK. REMOVE OLD PATCH BEFORE APPLYING NEW PATCH. 24 patch 3   MILK THISTLE PO Take 1 tablet by mouth daily.     Misc Natural Products (ELDERBERRY ZINC/VIT C/IMMUNE MT) Use as directed 1 tablet in the mouth or throat daily.     pantoprazole (PROTONIX) 40 MG tablet TAKE 1 TABLET BY MOUTH EVERY DAY 90 tablet 3   No facility-administered medications prior to visit.     Per HPI unless specifically indicated in ROS section below Review of Systems  Constitutional:  Negative for fatigue and fever.  HENT:  Negative for congestion.   Eyes:  Negative for pain.  Respiratory:  Negative for cough and shortness of breath.   Cardiovascular:  Negative for chest pain, palpitations and leg swelling.  Gastrointestinal:  Negative for abdominal pain.  Genitourinary:  Negative for dysuria and vaginal bleeding.  Musculoskeletal:  Negative for back pain.  Neurological:  Negative  for syncope, light-headedness and headaches.  Psychiatric/Behavioral:  Negative for dysphoric mood.   Objective:  BP 92/70   Pulse 68   Temp 97.6 F (36.4 C) (Temporal)   Ht 5\' 5"  (1.651 m)   Wt 176 lb 6 oz (80 kg)   SpO2 98%   BMI 29.35 kg/m   Wt Readings from Last 3 Encounters:  06/24/21 176 lb 6 oz (80 kg)  05/08/21 179 lb (81.2 kg)  05/06/21 179 lb 3.2 oz (81.3 kg)      Physical Exam Constitutional:      General: She is not in acute distress.    Appearance: Normal appearance. She is well-developed. She is not ill-appearing or toxic-appearing.  HENT:     Head: Normocephalic.     Right Ear: Hearing, tympanic membrane, ear canal and external ear normal. Tympanic membrane is not erythematous, retracted or bulging.     Left Ear: Hearing, tympanic membrane,  ear canal and external ear normal. Tympanic membrane is not erythematous, retracted or bulging.     Nose: No mucosal edema or rhinorrhea.     Right Sinus: No maxillary sinus tenderness or frontal sinus tenderness.     Left Sinus: No maxillary sinus tenderness or frontal sinus tenderness.     Mouth/Throat:     Pharynx: Uvula midline.  Eyes:     General: Lids are normal. Lids are everted, no foreign bodies appreciated.     Conjunctiva/sclera: Conjunctivae normal.     Pupils: Pupils are equal, round, and reactive to light.  Neck:     Thyroid: No thyroid mass or thyromegaly.     Vascular: No carotid bruit.     Trachea: Trachea normal.  Cardiovascular:     Rate and Rhythm: Normal rate and regular rhythm.     Pulses: Normal pulses.     Heart sounds: Normal heart sounds, S1 normal and S2 normal. No murmur heard.   No friction rub. No gallop.  Pulmonary:     Effort: Pulmonary effort is normal. No tachypnea or respiratory distress.     Breath sounds: Normal breath sounds. No decreased breath sounds, wheezing, rhonchi or rales.  Abdominal:     General: Bowel sounds are normal.     Palpations: Abdomen is soft.     Tenderness: There is no abdominal tenderness.  Musculoskeletal:     Cervical back: Normal range of motion and neck supple.  Skin:    General: Skin is warm and dry.     Findings: No rash.  Neurological:     Mental Status: She is alert.  Psychiatric:        Mood and Affect: Mood is not depressed. Affect is blunt and flat.        Speech: Speech normal.        Behavior: Behavior is agitated. Behavior is cooperative.        Thought Content: Thought content normal.        Cognition and Memory: Cognition and memory normal.        Judgment: Judgment normal.     Comments: Laughing inappropriately, unable to answer some questions.. say " I just don't know" repeatedly.      Results for orders placed or performed in visit on 05/08/21  CULTURE, URINE COMPREHENSIVE   Specimen: Urine    UR  Result Value Ref Range   Urine Culture, Comprehensive Final report    Organism ID, Bacteria Lactobacillus species    Organism ID, Bacteria Comment   Microscopic Examination  Urine  Result Value Ref Range   WBC, UA 0-5 0 - 5 /hpf   RBC 0-2 0 - 2 /hpf   Epithelial Cells (non renal) >10 (A) 0 - 10 /hpf   Bacteria, UA Moderate (A) None seen/Few  Urinalysis, Complete  Result Value Ref Range   Specific Gravity, UA 1.025 1.005 - 1.030   pH, UA 6.0 5.0 - 7.5   Color, UA Yellow Yellow   Appearance Ur Cloudy (A) Clear   Leukocytes,UA Negative Negative   Protein,UA Negative Negative/Trace   Glucose, UA Negative Negative   Ketones, UA Negative Negative   RBC, UA Negative Negative   Bilirubin, UA Negative Negative   Urobilinogen, Ur 0.2 0.2 - 1.0 mg/dL   Nitrite, UA Negative Negative   Microscopic Examination See below:     This visit occurred during the SARS-CoV-2 public health emergency.  Safety protocols were in place, including screening questions prior to the visit, additional usage of staff PPE, and extensive cleaning of exam room while observing appropriate contact time as indicated for disinfecting solutions.   COVID 19 screen:  No recent travel or known exposure to COVID19 The patient denies respiratory symptoms of COVID 19 at this time. The importance of social distancing was discussed today.   Assessment and Plan    Problem List Items Addressed This Visit     Chronic insomnia    Poor control. She is worried med like tramadol will make her feel sluggish and sedated next day at work.  Will use alprazolam for sleep at night temporarily. Reviewed healthy sleep hygiene.       Current moderate episode of major depressive disorder ([place]) - Primary    Poor control.. worsened off lexapro and with loss of daughter.  She is not interested in restarting lexaporo low dose ( no SE, just does not want to take)  Referred to psychology for counseling.. this has been very helpful in  the past for her.       Relevant Medications   ALPRAZolam (XANAX) 0.5 MG tablet   Other Relevant Orders   Ambulatory referral to Psychology   Generalized anxiety disorder    Poor control, recommended restart SSRI, but she is not interested at this point.  Can use alprazolam as needed for anxiety/sleep. Referred to counseling.      Relevant Medications   ALPRAZolam (XANAX) 0.5 MG tablet   Other Relevant Orders   Ambulatory referral to Psychology   Grieving    Loss of child.   Having symptoms beyond normal healthy grieving.      Relevant Orders   Ambulatory referral to Psychology   Jittery    No tremor on exam. Likely due to anxiety, skip[ping meals, lack of sleep etc. Will check glucose and electrolytes today as she is fasting.  encouraged 3 meals with snack daily, each meal with protein and fiber.      Relevant Orders   Comprehensive metabolic panel   Mixed hyperlipidemia    Due for re-eval.      Other Visit Diagnoses     Screening cholesterol level       Relevant Orders   Lipid panel        Eliezer Lofts, MD

## 2021-06-24 NOTE — Assessment & Plan Note (Addendum)
Poor control.. worsened off lexapro and with loss of daughter.  She is not interested in restarting lexaporo low dose ( no SE, just does not want to take)  Referred to psychology for counseling.. this has been very helpful in the past for her.

## 2021-06-24 NOTE — Assessment & Plan Note (Signed)
Loss of child.   Having symptoms beyond normal healthy grieving.

## 2021-06-24 NOTE — Patient Instructions (Addendum)
Please stop at the lab to have labs drawn.  Call to set up counseling as discussed.  Consider restarting Lexapro low dose.  Increase alprazolam to 1 tab as needed during the day and 1 tablet as needed for sleep at night. Do not skip meals, make sure each meal had protein and fiber in it.

## 2021-06-24 NOTE — Assessment & Plan Note (Signed)
No tremor on exam. Likely due to anxiety, skip[ping meals, lack of sleep etc. Will check glucose and electrolytes today as she is fasting.  encouraged 3 meals with snack daily, each meal with protein and fiber.

## 2021-06-24 NOTE — Assessment & Plan Note (Signed)
Poor control. She is worried med like tramadol will make her feel sluggish and sedated next day at work.  Will use alprazolam for sleep at night temporarily. Reviewed healthy sleep hygiene.

## 2021-06-24 NOTE — Assessment & Plan Note (Signed)
Due for re-eval. 

## 2021-06-24 NOTE — Assessment & Plan Note (Signed)
Poor control, recommended restart SSRI, but she is not interested at this point.  Can use alprazolam as needed for anxiety/sleep. Referred to counseling.

## 2021-06-25 ENCOUNTER — Encounter: Payer: Self-pay | Admitting: *Deleted

## 2021-07-29 ENCOUNTER — Ambulatory Visit: Payer: 59 | Admitting: Family Medicine

## 2021-07-29 ENCOUNTER — Telehealth: Payer: Self-pay

## 2021-07-29 NOTE — Telephone Encounter (Signed)
[place] Night - Client Nonclinical Telephone Record  [facility] [place] Night - Client Client Site [place] - Night Provider Eliezer Lofts - MD Contact Type Call Who Is Calling Patient / Member / Family / Caregiver Caller Name [place] Phone Number [phone] Patient Name Rebecca Allen Patient DOB April 13, 2066 Call Type Message Only Information Provided Reason for Call Request to [place] Appointment Initial Comment Caller states they need to cancel appt because they have fever. Patient request to speak to RN No Additional Comment Provided office hours Disp. Time Disposition Final User 07/29/2021 8:16:36 AM General Information Provided Yes Ian Malkin Call Closed By: Ian Malkin Transaction Date/Time: 07/29/2021 8:12:12 AM (ET

## 2021-08-19 ENCOUNTER — Ambulatory Visit: Payer: 59 | Admitting: Family Medicine

## 2021-08-19 ENCOUNTER — Other Ambulatory Visit: Payer: Self-pay

## 2021-08-19 ENCOUNTER — Encounter: Payer: Self-pay | Admitting: Family Medicine

## 2021-08-19 VITALS — BP 100/60 | HR 76 | Temp 98.0°F | Ht 65.0 in | Wt 179.5 lb

## 2021-08-19 DIAGNOSIS — F4321 Adjustment disorder with depressed mood: Secondary | ICD-10-CM

## 2021-08-19 DIAGNOSIS — F331 Major depressive disorder, recurrent, moderate: Secondary | ICD-10-CM

## 2021-08-19 DIAGNOSIS — R42 Dizziness and giddiness: Secondary | ICD-10-CM

## 2021-08-19 DIAGNOSIS — J0100 Acute maxillary sinusitis, unspecified: Secondary | ICD-10-CM

## 2021-08-19 MED ORDER — AMOXICILLIN 500 MG PO CAPS: 1000.0000 mg | ORAL_CAPSULE | Freq: Two times a day (BID) | ORAL | 0 refills | Status: DC

## 2021-08-19 MED ORDER — ESCITALOPRAM OXALATE 5 MG PO TABS: 5.0000 mg | ORAL_TABLET | Freq: Every day | ORAL | 4 refills | Status: DC

## 2021-08-19 NOTE — Progress Notes (Signed)
Patient ID: Rebecca Allen, female    DOB: February 01, 1966, 56 y.o.   MRN: [id number]  This visit was conducted in person.  BP 100/60    Pulse 76    Temp 98 F (36.7 C) (Temporal)    Ht 5\' 5"  (1.651 m)    Wt 179 lb 8 oz (81.4 kg)    SpO2 96%    BMI 29.87 kg/m    CC:  Chief Complaint  Patient presents with   Follow-up    Mood   Dizziness   Facial Pain   Ear Pain   Nasal Congestion    Subjective:   HPI: Rebecca Allen is a 56 y.o. female presenting on 08/19/2021 for Follow-up (Mood), Dizziness, Facial Pain, Ear Pain, and Nasal Congestion  MDD, GAD :  At last OV 06/2021 her mood was worsened off lexapro and with the passing of her daughter.  Referred to counseling She was not interested in restarting SSRI. Using alprazolam at night for sleep.   Today she reports she has been unable to get in until 10/2020.  She feels her mood is worse now.  She is ready now to restart the lexapro.  She plans on starting grieving meetings at husbands work.  GAD7:  18  PHq9: 19     In Last 4-6 days.. she has nasal congestion , pressure in forehead and cheek bones. She feels dizzy.  In last 3 days started with vertigo, green nasal discharge. Has used ibuprofen sudafed, nasal steroid.   Did COVID test neg  today  Relevant past medical, surgical, family and social history reviewed and updated as indicated. Interim medical history since our last visit reviewed. Allergies and medications reviewed and updated. Outpatient Medications Prior to Visit  Medication Sig Dispense Refill   ALPRAZolam (XANAX) 0.5 MG tablet Take 1 tablet (0.5 mg total) by mouth 2 (two) times daily as needed for anxiety or sleep. 60 tablet 0   aspirin 81 MG chewable tablet Chew 81 mg by mouth daily.     BIOTIN PO Take 1 tablet by mouth daily.     BLACK CURRANT SEED OIL PO Take 5 mLs by mouth daily.     calcium carbonate (TUMS - DOSED IN MG ELEMENTAL CALCIUM) 500 MG chewable tablet Chew 6 tablets by mouth daily as  needed for indigestion or heartburn.     Cholecalciferol (VITAMIN D3) 25 MCG (1000 UT) CAPS Take by mouth.     estradiol (VIVELLE-DOT) 0.05 MG/24HR patch PLACE 1 PATCH TRANS-DERMALLY TWICE A WEEK. REMOVE OLD PATCH BEFORE APPLYING NEW PATCH. 24 patch 3   MILK THISTLE PO Take 1 tablet by mouth daily.     Misc Natural Products (ELDERBERRY ZINC/VIT C/IMMUNE MT) Use as directed 1 tablet in the mouth or throat daily.     pantoprazole (PROTONIX) 40 MG tablet Take 1 tablet (40 mg total) by mouth daily. 90 tablet 3   No facility-administered medications prior to visit.     Per HPI unless specifically indicated in ROS section below Review of Systems  Constitutional:  Negative for fatigue and fever.  HENT:  Positive for congestion, sinus pressure and sinus pain.   Eyes:  Negative for pain.  Respiratory:  Negative for cough and shortness of breath.   Cardiovascular:  Negative for chest pain, palpitations and leg swelling.  Gastrointestinal:  Negative for abdominal pain.  Genitourinary:  Negative for dysuria and vaginal bleeding.  Musculoskeletal:  Negative for back pain.  Neurological:  Negative for syncope,  light-headedness and headaches.  Psychiatric/Behavioral:  Positive for agitation, decreased concentration, dysphoric mood and sleep disturbance. Negative for suicidal ideas. The patient is nervous/anxious.   Objective:  BP 100/60    Pulse 76    Temp 98 F (36.7 C) (Temporal)    Ht 5\' 5"  (1.651 m)    Wt 179 lb 8 oz (81.4 kg)    SpO2 96%    BMI 29.87 kg/m   Wt Readings from Last 3 Encounters:  08/19/21 179 lb 8 oz (81.4 kg)  06/24/21 176 lb 6 oz (80 kg)  05/08/21 179 lb (81.2 kg)      Physical Exam Constitutional:      General: She is not in acute distress.    Appearance: Normal appearance. She is well-developed. She is not ill-appearing or toxic-appearing.  HENT:     Head: Normocephalic.     Right Ear: Hearing, tympanic membrane, ear canal and external ear normal. Tympanic membrane is  not erythematous, retracted or bulging.     Left Ear: Hearing, tympanic membrane, ear canal and external ear normal. Tympanic membrane is not erythematous, retracted or bulging.     Nose: No mucosal edema or rhinorrhea.     Right Sinus: No maxillary sinus tenderness or frontal sinus tenderness.     Left Sinus: No maxillary sinus tenderness or frontal sinus tenderness.     Mouth/Throat:     Pharynx: Uvula midline.  Eyes:     General: Lids are normal. Lids are everted, no foreign bodies appreciated.     Conjunctiva/sclera: Conjunctivae normal.     Pupils: Pupils are equal, round, and reactive to light.  Neck:     Thyroid: No thyroid mass or thyromegaly.     Vascular: No carotid bruit.     Trachea: Trachea normal.  Cardiovascular:     Rate and Rhythm: Normal rate and regular rhythm.     Pulses: Normal pulses.     Heart sounds: Normal heart sounds, S1 normal and S2 normal. No murmur heard.   No friction rub. No gallop.  Pulmonary:     Effort: Pulmonary effort is normal. No tachypnea or respiratory distress.     Breath sounds: Normal breath sounds. No decreased breath sounds, wheezing, rhonchi or rales.  Abdominal:     General: Bowel sounds are normal.     Palpations: Abdomen is soft.     Tenderness: There is no abdominal tenderness.  Musculoskeletal:     Cervical back: Normal range of motion and neck supple.  Skin:    General: Skin is warm and dry.     Findings: No rash.  Neurological:     Mental Status: She is alert.  Psychiatric:        Attention and Perception: Attention normal.        Mood and Affect: Mood is depressed. Mood is not anxious. Affect is flat.        Speech: Speech normal.        Behavior: Behavior is withdrawn. Behavior is cooperative.        Thought Content: Thought content normal.        Cognition and Memory: Cognition normal.        Judgment: Judgment normal.      Results for orders placed or performed in visit on 06/24/21  Comprehensive metabolic panel   Result Value Ref Range   Sodium 137 135 - 145 mEq/L   Potassium 4.1 3.5 - 5.1 mEq/L   Chloride 101 96 - 112 mEq/L  CO2 29 19 - 32 mEq/L   Glucose, Bld 88 70 - 99 mg/dL   BUN 21 6 - 23 mg/dL   Creatinine, Ser 0.81 0.40 - 1.20 mg/dL   Total Bilirubin 0.4 0.2 - 1.2 mg/dL   Alkaline Phosphatase 51 39 - 117 U/L   AST 24 0 - 37 U/L   ALT 37 (H) 0 - 35 U/L   Total Protein 7.6 6.0 - 8.3 g/dL   Albumin 5.0 3.5 - 5.2 g/dL   GFR 82.74 >60.00 mL/min   Calcium 10.2 8.4 - 10.5 mg/dL  Lipid panel  Result Value Ref Range   Cholesterol 255 (H) 0 - 200 mg/dL   Triglycerides 224.0 (H) 0.0 - 149.0 mg/dL   HDL 54.00 >39.00 mg/dL   VLDL 44.8 (H) 0.0 - 40.0 mg/dL   Total CHOL/HDL Ratio 5    NonHDL 201.00   LDL cholesterol, direct  Result Value Ref Range   Direct LDL 173.0 mg/dL    This visit occurred during the SARS-CoV-2 public health emergency.  Safety protocols were in place, including screening questions prior to the visit, additional usage of staff PPE, and extensive cleaning of exam room while observing appropriate contact time as indicated for disinfecting solutions.   COVID 19 screen:  No recent travel or known exposure to COVID19 The patient denies respiratory symptoms of COVID 19 at this time. The importance of social distancing was discussed today.   Assessment and Plan Problem List Items Addressed This Visit     Acute non-recurrent maxillary sinusitis    No current sign of bacterial infection. Most likely allergic or viral etiology. Continue nasal steroid, add nasal saline.  If not improving in 3-4 days fill rx for antibiotics.       Relevant Medications   amoxicillin (AMOXIL) 500 MG capsule   Current moderate episode of major depressive disorder (HCC) - Primary    Worsening control , in excess of normal grieving.   Start counseling.Locations and numbers given in AVS.   Start Lexapro  5 mg at bedtime.  Follow up in 4 weeks.      Relevant Medications   escitalopram  (LEXAPRO) 5 MG tablet   Grieving   Other Visit Diagnoses     Vertigo           Meds ordered this encounter  Medications   escitalopram (LEXAPRO) 5 MG tablet    Sig: Take 1 tablet (5 mg total) by mouth daily.    Dispense:  30 tablet    Refill:  4   amoxicillin (AMOXIL) 500 MG capsule    Sig: Take 2 capsules (1,000 mg total) by mouth 2 (two) times daily.    Dispense:  40 capsule    Refill:  0       Eliezer Lofts, MD

## 2021-08-19 NOTE — Patient Instructions (Addendum)
Start counseling.   Start Lexapro  5 mg at bedtime  Continue nasal steroid, add nasal saline.  If not improving in 3-4 days fill rx for antibiotics.   [place] and [place] locations    [facility]  [phone]  -[place]  [phone]  -[place]  [phone]     [facility]  Address: [address] [place], [place], [place] [address]  Phone: [phone]   [facility]  Address: [address] [place], [place], [place] [address]  Phone: [phone]   -[facility]  Address: [address], [place],  [address]  Phone: [phone]

## 2021-08-22 ENCOUNTER — Other Ambulatory Visit: Payer: Self-pay

## 2021-08-22 ENCOUNTER — Emergency Department: Payer: 59

## 2021-08-22 ENCOUNTER — Emergency Department
Admission: EM | Admit: 2021-08-22 | Discharge: 2021-08-22 | Disposition: A | Discharge status: Home / Self Care | Payer: 59 | Attending: Emergency Medicine | Admitting: Emergency Medicine

## 2021-08-22 ENCOUNTER — Encounter: Payer: Self-pay | Admitting: Emergency Medicine

## 2021-08-22 DIAGNOSIS — R110 Nausea: Secondary | ICD-10-CM | POA: Diagnosis not present

## 2021-08-22 DIAGNOSIS — R202 Paresthesia of skin: Secondary | ICD-10-CM | POA: Insufficient documentation

## 2021-08-22 DIAGNOSIS — R42 Dizziness and giddiness: Secondary | ICD-10-CM | POA: Insufficient documentation

## 2021-08-22 DIAGNOSIS — H9212 Otorrhea, left ear: Secondary | ICD-10-CM | POA: Insufficient documentation

## 2021-08-22 LAB — URINALYSIS, ROUTINE W REFLEX MICROSCOPIC
Bilirubin Urine: NEGATIVE
Glucose, UA: NEGATIVE mg/dL
Hgb urine dipstick: NEGATIVE
Ketones, ur: 5 mg/dL — AB
Leukocytes,Ua: NEGATIVE
Nitrite: NEGATIVE
Protein, ur: NEGATIVE mg/dL
Specific Gravity, Urine: >1.046 — ABNORMAL HIGH (ref 1.005–1.030)
pH: 5 (ref 5.0–8.0)

## 2021-08-22 LAB — CBC
HCT: 45.4 % (ref 36.0–46.0)
Hemoglobin: 15.2 g/dL — ABNORMAL HIGH (ref 12.0–15.0)
MCH: 31.3 pg (ref 26.0–34.0)
MCHC: 33.5 g/dL (ref 30.0–36.0)
MCV: 93.4 fL (ref 80.0–100.0)
Platelets: 264 10*3/uL (ref 150–400)
RBC: 4.86 MIL/uL (ref 3.87–5.11)
RDW: 13 % (ref 11.5–15.5)
WBC: 7.6 10*3/uL (ref 4.0–10.5)
nRBC: 0 % (ref 0.0–0.2)

## 2021-08-22 LAB — BASIC METABOLIC PANEL
Anion gap: 8 (ref 5–15)
BUN: 19 mg/dL (ref 6–20)
CO2: 24 mmol/L (ref 22–32)
Calcium: 9.2 mg/dL (ref 8.9–10.3)
Chloride: 102 mmol/L (ref 98–111)
Creatinine, Ser: 0.86 mg/dL (ref 0.44–1.00)
GFR, Estimated: >60 mL/min (ref >60)
Glucose, Bld: 102 mg/dL — ABNORMAL HIGH (ref 70–99)
Potassium: 4.2 mmol/L (ref 3.5–5.1)
Sodium: 134 mmol/L — ABNORMAL LOW (ref 135–145)

## 2021-08-22 LAB — TROPONIN I (HIGH SENSITIVITY): Troponin I (High Sensitivity): 3 ng/L (ref <18)

## 2021-08-22 MED ORDER — DIAZEPAM 5 MG/ML IJ SOLN
2.5000 mg | Freq: Once | INTRAMUSCULAR | Status: AC
Start: 2021-08-22 — End: 2021-08-22
  Administered 2021-08-22: 2.5 mg via INTRAVENOUS
  Filled 2021-08-22: qty 2

## 2021-08-22 MED ORDER — LORAZEPAM 2 MG/ML IJ SOLN: 0.5000 mg | Freq: Once | INTRAMUSCULAR | Status: DC | PRN

## 2021-08-22 MED ORDER — IOHEXOL 350 MG/ML SOLN
100.0000 mL | Freq: Once | INTRAVENOUS | Status: AC | PRN
  Administered 2021-08-22: 100 mL via INTRAVENOUS
  Filled 2021-08-22: qty 100

## 2021-08-22 MED ORDER — MECLIZINE HCL 25 MG PO TABS
25.0000 mg | ORAL_TABLET | Freq: Once | ORAL | Status: AC
  Administered 2021-08-22: 25 mg via ORAL
  Filled 2021-08-22: qty 1

## 2021-08-22 MED ORDER — MECLIZINE HCL 25 MG PO TABS: 25.0000 mg | ORAL_TABLET | Freq: Three times a day (TID) | ORAL | 0 refills | Status: DC | PRN

## 2021-08-22 MED ORDER — SODIUM CHLORIDE 0.9 % IV BOLUS
1000.0000 mL | Freq: Once | INTRAVENOUS | Status: AC
  Administered 2021-08-22: 1000 mL via INTRAVENOUS

## 2021-08-22 MED ORDER — ONDANSETRON 4 MG PO TBDP: 4.0000 mg | ORAL_TABLET | Freq: Three times a day (TID) | ORAL | 0 refills | Status: AC | PRN

## 2021-08-22 MED ORDER — ONDANSETRON HCL 4 MG/2ML IJ SOLN
4.0000 mg | Freq: Once | INTRAMUSCULAR | Status: AC
  Administered 2021-08-22: 4 mg via INTRAVENOUS
  Filled 2021-08-22: qty 2

## 2021-08-22 NOTE — Discharge Instructions (Addendum)
Your work-up was reassuring other than the incidental noted lesions on your CT imaging which you discussed with your primary care doctor.  We are starting you on some nausea medicine and some antivertigo medication.  Please call ENT on Monday to schedule a follow-up and return to the ER if you develop any worsening symptoms or any other concerns  1. Negative for acute aortic syndrome. Normal appearance of the thoracic and abdominal aorta without aneurysm, dissection or stenosis. 2. No acute abnormality in the chest, abdomen or pelvis. 3.  Aortic Atherosclerosis (ICD10-I70.0). 4. Two small enhancing structures in the liver, largest measures 1.0 cm. These are likely incidental findings/lesions unless patient has history or concern for neoplastic disease

## 2021-08-22 NOTE — ED Notes (Signed)
Provided phone to pt to speak with MRI screener.

## 2021-08-22 NOTE — ED Triage Notes (Signed)
C/O dizziness since Saturday. Seen by PCP on Tuesday, being treated for a sinus infection  -- with amoxicilin and sudafed.  States dizziness worsening today. Also c/o strange sensation to left chest down left arm.

## 2021-08-22 NOTE — ED Provider Notes (Addendum)
[facility] Provider Note    Event Date/Time   First MD Initiated Contact with Patient 08/22/21 1124     (approximate)   History   Dizziness   HPI  Rebecca Allen is a 56 y.o. female  with anxiety  Who comes in with dizziness. Reports dizziness fo 6 days. Pt reports beings started on amoxicillin secondary to sinusitis 3 days ago.. Since then she has had worsening dizziness and nausea.  She reports not feeling hungry due to nausea.  Then this morning she developed left arm numbness and tingling. Reports some drainage from the left ear. Denies hearing loss. Denies chest pain or palpitations but does have some L shoulder pain. No SOB, cough. Has tried ibuprofen and tylenol.   Does reports smoking history.  Denies fevers. No headache, neck stiffness.       Physical Exam   Triage Vital Signs: ED Triage Vitals  Enc Vitals Group     BP 08/22/21 0939 114/63     Pulse Rate 08/22/21 0939 74     Resp 08/22/21 0939 16     Temp 08/22/21 0939 98.5 F (36.9 C)     Temp Source 08/22/21 0939 Oral     SpO2 08/22/21 0939 95 %     Weight 08/22/21 0938 179 lb 7.3 oz (81.4 kg)     Height 08/22/21 0938 5\' 5"  (1.651 m)     Head Circumference --      Peak Flow --      Pain Score 08/22/21 0938 0     Pain Loc --      Pain Edu? --      Excl. in [place]? --     Most recent vital signs: Vitals:   08/22/21 0939  BP: 114/63  Pulse: 74  Resp: 16  Temp: 98.5 F (36.9 C)  SpO2: 95%     General: Awake, no distress. Well appearing  CV:  Good peripheral perfusion. Normal rate.  Pulses equal bilaterally.  TM clear bilaterally.  No pain behind the ears Resp:  Normal effort. Clear lungs  Abd:  No distention. Non tender  Other:  Pupils reactive bilaterally,  Neuro: Patient has some sensation changes on the left arm that come and go.  She reports just a little tingling at her fingertips at this time but previously it had been her entire arm that was tingling.  She  has no pronator drift.  Otherwise she appears neuro intact.  She reports a little bit of pain in her left arm she has no redness.  Full range of motion of joints.  She has good distal pulse.  No swelling of the arm.      ED Results / Procedures / Treatments   Labs (all labs ordered are listed, but only abnormal results are displayed) Labs Reviewed  BASIC METABOLIC PANEL - Abnormal; Notable for the following components:      Result Value   Sodium 134 (*)    Glucose, Bld 102 (*)    All other components within normal limits  CBC - Abnormal; Notable for the following components:   Hemoglobin 15.2 (*)    All other components within normal limits  URINALYSIS, ROUTINE W REFLEX MICROSCOPIC - Abnormal; Notable for the following components:   Color, Urine STRAW (*)    APPearance HAZY (*)    Specific Gravity, Urine >1.046 (*)    Ketones, ur 5 (*)    All other components within normal limits  CBG MONITORING,  ED  TROPONIN I (HIGH SENSITIVITY)     EKG  My interpretation of EKG: normal sinus rate no st elevation, no twi normal intervals.     RADIOLOGY I have reviewed the xray personally and agree with radiology read CT head was negative for intercranial hemorrhage   PROCEDURES:  Critical Care performed: No  Procedures   MEDICATIONS ORDERED IN ED: Medications - No data to display   IMPRESSION / MDM / [place] / ED COURSE  I reviewed the triage vital signs and the nursing notes.   Differential diagnosis includes, but is not limited to, worsening ear infection, ekg, cardiac markers to rule out ACS, electrolyte abnormalities, thyroid dysfunction, consider stroke/mass, dissection  I reviewed a note from 08/19/2021 where patient was seen by her primary care doctor started on Lexapro and a nasal steroid and was prescribed some antibiotics if things were not getting better.  Appears that she was put on amoxicillin.  I did order a CT dissection to make sure no dissection  given patient is reporting some pain in the shoulder blade and the arm with the tingling sensation.  This was negative for dissection.  There was incidentally noted 2 small lesions on her liver which are most likely incidental however if she has a history of concern for neoplastic could be more concerning.  I discussed this with patient and she will follow this up with her primary care doctor.   Her troponin was negative and she is low risk for ACS and symptoms have been going on for greater than 3 hours.  Her pain started at 5 AM and seems more musculoskeletal in nature.  Her troponin was drawn at 940.  I discussed repeat troponin but at this time she is declining.  Seems more musculoskeletal in nature given its worse with  movement.  She states that she thinks that she slept on it.  Given her low heart score of 2 and pain started >3 hours prior to trop drawn she does not need repeat trop.   [name] urine without evidence of UTI but did have elevated specific gravity so patient was given some fluids.  She has no evidence of Electra abnormalities except for slightly low sodium but her kidney function is normal  Her CT head is without evidence of any mass.  She had an MRI without evidence of stroke.  I reevaluated patient she is feeling better.  I suspect that she has some kind of inner ear issue with this recent infection.  We discussed doing some steroids but patient declined stating that steroids make her feel really unwell.  We want to prescribe her some meclizine, Zofran and she can follow-up with ENT.  She expressed understanding felt comfortable with this plan  I discussed the provisional nature of ED diagnosis, the treatment so far, the ongoing plan of care, follow up appointments and return precautions with the patient and any family or support people present. They expressed understanding and agreed with the plan, discharged home.          FINAL CLINICAL IMPRESSION(S) / ED DIAGNOSES    Final diagnoses:  Vertigo     Rx / DC Orders   ED Discharge Orders          Ordered    meclizine (ANTIVERT) 25 MG tablet  3 times daily PRN        08/22/21 1709    ondansetron (ZOFRAN-ODT) 4 MG disintegrating tablet  Every 8 hours PRN  08/22/21 1709             Note:  This document was prepared using Dragon voice recognition software and may include unintentional dictation errors.   Vanessa Amsterdam, MD 08/22/21 1716    Vanessa Smoot, MD 08/22/21 [phone]

## 2021-09-09 DIAGNOSIS — J0100 Acute maxillary sinusitis, unspecified: Secondary | ICD-10-CM | POA: Insufficient documentation

## 2021-09-09 NOTE — Assessment & Plan Note (Signed)
Worsening control , in excess of normal grieving.   Start counseling.Locations and numbers given in AVS.   Start Lexapro  5 mg at bedtime.  Follow up in 4 weeks.

## 2021-09-09 NOTE — Assessment & Plan Note (Signed)
No current sign of bacterial infection. Most likely allergic or viral etiology. Continue nasal steroid, add nasal saline.  If not improving in 3-4 days fill rx for antibiotics.

## 2021-09-10 ENCOUNTER — Other Ambulatory Visit: Payer: Self-pay | Admitting: Family Medicine

## 2021-10-01 ENCOUNTER — Other Ambulatory Visit: Payer: Self-pay | Admitting: Family Medicine

## 2021-10-01 ENCOUNTER — Telehealth: Payer: Self-pay | Admitting: Family Medicine

## 2021-10-01 DIAGNOSIS — F5104 Psychophysiologic insomnia: Secondary | ICD-10-CM

## 2021-10-01 DIAGNOSIS — F331 Major depressive disorder, recurrent, moderate: Secondary | ICD-10-CM

## 2021-10-01 DIAGNOSIS — F410 Panic disorder [episodic paroxysmal anxiety] without agoraphobia: Secondary | ICD-10-CM

## 2021-10-01 DIAGNOSIS — F411 Generalized anxiety disorder: Secondary | ICD-10-CM

## 2021-10-01 MED ORDER — TRIAMCINOLONE ACETONIDE 0.5 % EX CREA: 1.0000 "application " | TOPICAL_CREAM | Freq: Two times a day (BID) | CUTANEOUS | 0 refills | Status: DC

## 2021-10-01 NOTE — Telephone Encounter (Signed)
Pt called she needs a refill on her eczema cream called in she is unsure of the name

## 2021-10-01 NOTE — Telephone Encounter (Signed)
No cream on current medication list.  Triamcinolone 0.5% is on medication history.

## 2021-10-01 NOTE — Telephone Encounter (Signed)
Refilled triamcinolone

## 2021-10-03 NOTE — Addendum Note (Signed)
Addended by: Eliezer Lofts E on: 10/03/2021 05:06 PM   Modules accepted: Orders

## 2021-10-03 NOTE — Telephone Encounter (Signed)
Rebecca Allen called in and wanted to know about having Rebecca Allen to call her to over some other symptoms

## 2021-10-03 NOTE — Telephone Encounter (Addendum)
I spoke with pt; pt said her congestion is better.pt said she weaned off of Lexapro about 2 wks ago due to med making pt feel groggy and dizzy. Pt was taking med at nighttime as instructed but still pt could not take med because of how it made her feel. Pt has xanax for prn use but pt is trying to cope with anxiety on her own without medication. Pt understands there is a DNA test that pt could take to see what med is good for her body and will help her issues. Pt said she cannot keep trying med that does not work for her. Pt request cb after reviewed by Dr Diona Browner.

## 2021-10-03 NOTE — Telephone Encounter (Signed)
Tish notified as instructed by telephone.  She is agreeable to psychiatry referral.  [place] location.

## 2021-10-03 NOTE — Telephone Encounter (Signed)
I do not know of a test that does what she wants except for possibly at an alternative med doctor. She can look online.  I can refer her to psychiatry as well.

## 2021-10-26 ENCOUNTER — Other Ambulatory Visit: Payer: Self-pay

## 2021-10-26 ENCOUNTER — Emergency Department
Admission: EM | Admit: 2021-10-26 | Discharge: 2021-10-26 | Disposition: A | Discharge status: Home / Self Care | Payer: 59 | Attending: Student in an Organized Health Care Education/Training Program | Admitting: Student in an Organized Health Care Education/Training Program

## 2021-10-26 ENCOUNTER — Emergency Department: Payer: 59

## 2021-10-26 DIAGNOSIS — R0789 Other chest pain: Secondary | ICD-10-CM | POA: Insufficient documentation

## 2021-10-26 DIAGNOSIS — F17200 Nicotine dependence, unspecified, uncomplicated: Secondary | ICD-10-CM | POA: Insufficient documentation

## 2021-10-26 LAB — BASIC METABOLIC PANEL
Anion gap: 11 (ref 5–15)
BUN: 14 mg/dL (ref 6–20)
CO2: 24 mmol/L (ref 22–32)
Calcium: 9.4 mg/dL (ref 8.9–10.3)
Chloride: 104 mmol/L (ref 98–111)
Creatinine, Ser: 0.64 mg/dL (ref 0.44–1.00)
GFR, Estimated: >60 mL/min (ref >60)
Glucose, Bld: 96 mg/dL (ref 70–99)
Potassium: 3.8 mmol/L (ref 3.5–5.1)
Sodium: 139 mmol/L (ref 135–145)

## 2021-10-26 LAB — CBC
HCT: 39.2 % (ref 36.0–46.0)
Hemoglobin: 13.1 g/dL (ref 12.0–15.0)
MCH: 30.8 pg (ref 26.0–34.0)
MCHC: 33.4 g/dL (ref 30.0–36.0)
MCV: 92.2 fL (ref 80.0–100.0)
Platelets: 266 10*3/uL (ref 150–400)
RBC: 4.25 MIL/uL (ref 3.87–5.11)
RDW: 13.1 % (ref 11.5–15.5)
WBC: 6.9 10*3/uL (ref 4.0–10.5)
nRBC: 0 % (ref 0.0–0.2)

## 2021-10-26 LAB — TROPONIN I (HIGH SENSITIVITY)
Troponin I (High Sensitivity): 3 ng/L (ref <18)
Troponin I (High Sensitivity): 3 ng/L (ref <18)

## 2021-10-26 LAB — D-DIMER, QUANTITATIVE: D-Dimer, Quant: 0.33 ug/mL-FEU (ref 0.00–0.50)

## 2021-10-26 MED ORDER — DIAZEPAM 5 MG PO TABS
5.0000 mg | ORAL_TABLET | Freq: Once | ORAL | Status: AC
  Administered 2021-10-26: 5 mg via ORAL
  Filled 2021-10-26: qty 1

## 2021-10-26 NOTE — ED Notes (Signed)
Pt sts that no urine preg test is needed as she has had a hysterectomy. ?

## 2021-10-26 NOTE — ED Triage Notes (Signed)
Pt via POV from home. Pt c/o sudden onset of mid-sternal CP and SOB. Denies hx cardiac issues. Pt is A&OX4, pt extremely anxious and has a hx of anxiety. Reassured pt, pt states that she took 2 Xanax around 0700 and 1130 this AM but it has not helped.  ?

## 2021-10-26 NOTE — ED Provider Notes (Signed)
? ?[facility] ?Provider Note ? ? ? Event Date/Time  ? First MD Initiated Contact with Patient 10/26/21 1334   ?  (approximate) ? ? ?History  ? ?Chest Pain ? ? ?HPI ? ?Rebecca Allen is a 56 y.o. female with a history of anxiety and bronchitis does smoke presents to the ER for evaluation of chest discomfort and pleuritic discomfort.  States that she was having a panic attack earlier today was breathing through a bag took her Xanax but still feels very anxious and like her lungs are hurting.  Denies any diaphoresis no pain radiating through to her back or jaw no fevers or chills.  No abdominal pain. ?  ? ? ?Physical Exam  ? ?Triage Vital Signs: ?ED Triage Vitals  ?Enc Vitals Group  ?   BP 10/26/21 1257 101/71  ?   Pulse Rate 10/26/21 1257 91  ?   Resp 10/26/21 1257 (!) 26  ?   Temp 10/26/21 1257 98.6 ?F (37 ?C)  ?   Temp Source 10/26/21 1257 Oral  ?   SpO2 10/26/21 1257 98 %  ?   Weight 10/26/21 1259 170 lb (77.1 kg)  ?   Height 10/26/21 1259 5' 5.5" (1.664 m)  ?   Head Circumference --   ?   Peak Flow --   ?   Pain Score 10/26/21 1259 10  ?   Pain Loc --   ?   Pain Edu? --   ?   Excl. in [place]? --   ? ? ?Most recent vital signs: ?Vitals:  ? 10/26/21 1257 10/26/21 1402  ?BP: 101/71 (!) 98/56  ?Pulse: 91 82  ?Resp: (!) 26 17  ?Temp: 98.6 ?F (37 ?C)   ?SpO2: 98% 96%  ? ? ? ?Constitutional: Alert  anxious appearing ?Eyes: Conjunctivae are normal.  ?Head: Atraumatic. ?Nose: No congestion/rhinnorhea. ?Mouth/Throat: Mucous membranes are moist.   ?Neck: Painless ROM.  ?Cardiovascular:   Good peripheral circulation. No m/g/r ?Respiratory: Normal respiratory effort.  She is mildly tachypneic but does appear anxious.  She has scattered coarse breath sounds throughout mild expiratory wheeze but good air movement throughout ?Gastrointestinal: Soft and nontender.  ?Musculoskeletal:  no deformity ?Neurologic:  MAE spontaneously. No gross focal neurologic deficits are appreciated.  ?Skin:  Skin is warm,  dry and intact. No rash noted. ?Psychiatric: Mood and affect are anxious. Speech and behavior are normal. ? ? ? ?ED Results / Procedures / Treatments  ? ?Labs ?(all labs ordered are listed, but only abnormal results are displayed) ?Labs Reviewed  ?BASIC METABOLIC PANEL  ?CBC  ?D-DIMER, QUANTITATIVE  ?POC URINE PREG, ED  ?TROPONIN I (HIGH SENSITIVITY)  ?TROPONIN I (HIGH SENSITIVITY)  ? ? ? ?EKG ? ?ED ECG REPORT ?I, Merlyn Lot, the attending physician, personally viewed and interpreted this ECG. ? ? Date: 10/26/2021 ? EKG Time: 13:01 ? Rate: 85 ? Rhythm:  sinus ? Axis: normal ? Intervals: normal intervals ? ST&T Change: no stemi, no depression ? ? ? ?RADIOLOGY ?Please see ED Course for my review and interpretation. ? ?I personally reviewed all radiographic images ordered to evaluate for the above acute complaints and reviewed radiology reports and findings.  These findings were personally discussed with the patient.  Please see medical record for radiology report. ? ? ? ?PROCEDURES: ? ?Critical Care performed:  ? ?Procedures ? ? ?MEDICATIONS ORDERED IN ED: ?Medications  ?diazepam (VALIUM) tablet 5 mg (5 mg Oral Given 10/26/21 1406)  ? ? ? ?IMPRESSION / MDM /  ASSESSMENT AND PLAN / ED COURSE  ?I reviewed the triage vital signs and the nursing notes. ?             ?               ? ?Differential diagnosis includes, but is not limited to, ACS, pericarditis, esophagitis, boerhaaves, pe, dissection, pna, bronchitis, costochondritis ? ?Patient presenting with chest discomfort and anxiety as described above.  She is very anxious appearing but in no acute distress.  Her EKG is nonischemic.  Initial troponin ordered.  Chest x-ray ordered.  As I do suspect she is having anxiety attack will give Valium.  She does have some bronchitic sounding lungs therefore I did recommend nebulizer patient states that this makes her feel too jittery and is declining this. ? ?Clinical Course as of 10/26/21 1510  ?Sun Oct 26, 2021  ?1446  D-dimer negative.  Still waiting troponin.  Chest x-ray on my review and interpretation does not show any evidence of pneumothorax or consolidation. [PR]  ?1509 Patient reassessed.  Troponin negative.  She feels much improved after Valium.  Suspect this is related to panic attack but given her age and risk factors will order repeat troponin.  If negative do believe she will be appropriate for outpatient follow-up.  Patient is agreeable to plan. [PR]  ?  ?Clinical Course User Index ?[PR] Merlyn Lot, MD  ? ? ? ?FINAL CLINICAL IMPRESSION(S) / ED DIAGNOSES  ? ?Final diagnoses:  ?Atypical chest pain  ? ? ? ?Rx / DC Orders  ? ?ED Discharge Orders   ? ? None  ? ?  ? ? ? ?Note:  This document was prepared using Dragon voice recognition software and may include unintentional dictation errors. ? ?  ?Merlyn Lot, MD ?10/26/21 1510 ? ?

## 2021-10-27 ENCOUNTER — Telehealth: Payer: Self-pay | Admitting: Family Medicine

## 2021-10-27 NOTE — Telephone Encounter (Signed)
Pt called asking for a call back to discuss the medication change that was done during her ER visit on 10/26/21. Please advise. ?

## 2021-11-07 MED ORDER — DIAZEPAM 5 MG PO TABS: 5.0000 mg | ORAL_TABLET | Freq: Every day | ORAL | 0 refills | Status: DC | PRN

## 2021-11-07 NOTE — Telephone Encounter (Signed)
Patient was contacted and scheduled an ER follow up visit.  Please call patient back and see how she is doing and if she has any questions that need to be addressed before her visit on 11/18/21.  ? ?Thanks.  ?

## 2021-11-07 NOTE — Telephone Encounter (Signed)
Spoke with Rebecca Allen.  She was seen in the ER on 10/26/2021.  She was having an anxiety/panic attack that she couldn't shake and then she started having CP which took her to the ER.  The ER doctor told her to stop taking her xanax.  He gave her Valium 5 mg po while in the ER and Rebecca Allen states with in 15 minutes after taking the Valium she felt calm and almost normal.  She went home and threw away the xanax since the ER doctor told her to stop it but he told her he couldn't prescribe the Valium, that her PCP had to do that.  She states she has been struggling really bad the last couple of week because she does have an medication.  She does have an appointment with a psychologist in a week or so. ?She has also not been taking her lexapro because an ER doctor back in January told her to stop the lexapro because it could be attributing to her vertigo. ?Also back in January at the ER visit they did a CT and told her she had lesions on her liver but didn't seem concerned about that.  Rebecca Allen wanted Dr. Diona Browner to be aware of that as well. ?A hosptial follow up has been scheduled with Dr. Diona Browner for 11/18/21.  Rebecca Allen is asking if Dr. Diona Browner would send in a Rx for the Valium 5 mg until she can get in for her appointment. ? ?Please advise.  ?

## 2021-11-07 NOTE — Telephone Encounter (Signed)
Tish notified by telephone that Dr. Diona Browner sent in Rx for Valium 5 mg to [facility] [address]  ?

## 2021-11-07 NOTE — Telephone Encounter (Signed)
Prescription for Valium short-term sent in as requested. ?

## 2021-11-07 NOTE — Addendum Note (Signed)
Addended byEliezer Lofts E on: 11/07/2021 04:10 PM ? ? Modules accepted: Orders ? ?

## 2021-11-10 NOTE — Progress Notes (Signed)
Virtual Visit via Video Note ? ?I connected with Rebecca Allen on 11/12/21 at  9:00 AM EDT by a video enabled telemedicine application and verified that I am speaking with the correct person using two identifiers. ? ?Location: ?Patient: car ?Provider: office ?Persons participated in the visit- patient, provider  ?  ?I discussed the limitations of evaluation and management by telemedicine and the availability of in person appointments. The patient expressed understanding and agreed to proceed. ? ?  ?I discussed the assessment and treatment plan with the patient. The patient was provided an opportunity to ask questions and all were answered. The patient agreed with the plan and demonstrated an understanding of the instructions. ?  ?The patient was advised to call back or seek an in-person evaluation if the symptoms worsen or if the condition fails to improve as anticipated. ? ?I provided 45 minutes of non-face-to-face time during this encounter. ? ? ?Norman Clay, MD ? ? ? ? Psychiatric Initial Adult Assessment  ? ?Patient Identification: Rebecca Allen ?MRN:  [id number] ?Date of Evaluation:  11/12/2021 ?Referral Source: Jinny Sanders, MD  ?Chief Complaint:   ?Chief Complaint  ?Patient presents with  ? Anxiety  ? Depression  ? ?Visit Diagnosis:  ?  ICD-10-CM   ?1. Moderate episode of recurrent major depressive disorder (HCC)  F33.1   ?  ?2. Panic disorder  F41.0   ?  ?3. Generalized anxiety disorder  F41.1   ?  ? ? ?History of Present Illness:   ?Rebecca Allen is a 56 y.o. year old female with a history of depression, anxiety, hysterectomy, hyperlipidemia, who is referred for depression.  ? ?She states that she lost Quillian Quince, her daughter last August from Fentanyl overdose.  She has had worsening in depression, anxiety and panic attacks since then.  She feels angry and sad about the loss.  Her daughter would have been 49 year old this year.  She gave up custody of her 2 children to her father.   Although she does not complain about their step mother as she is great, she feels frustrated as she is unable to see them anymore.  She tries to shut things off, and keep it to herself.  She also states that she has no time to grieve due to the wedding of her son last year, followed by holidays. She has been also busy as her another daughter is getting married.  She now has 2 months old grandson.  She agrees that this reminds her of Quillian Quince as they were close with each other.  Although she comes to work regularly, she feels irritable and anxious all the time.  She does not feel like herself, stating that she used to be very relaxed.  ? ?Se has worsening in her mood symptoms as listed below.  ? ?Substance- she drinks a cocktail or beer on weekend at times, used CBD for anxiety , last in Feb. She did not like the test, and has no intention to use it again.  ? ?Daily routine: work, doing house chores ?Diet:  ?Exercise: ?Support:husband ?Household: husband, 3 dogs ?Marital status:married for 30 years ?Number of children: 3 (29, 31, 34-deceased) ?Employment: Engineer, maintenance for 27 years ?Education:  high school ?Last PCP / ongoing medical evaluation:   ?She was born in [place], grew up in [place]. She moved to [facility] in 2008 for her husband work ?She states that her mother died when she was 30 year old.  She has estranged relationship with her biological father.  She was raised by her maternal uncle and his wife, who she calls as her parents.  Her uncle had issues with alcohol, which created conflict in their marriage, and she was protective of her sibling.  ? ?Associated Signs/Symptoms: ?Depression Symptoms:  depressed mood, ?anhedonia, ?insomnia, ?fatigue, ?difficulty concentrating, ?anxiety, ?panic attacks, ?decreased appetite, ?(Hypo) Manic Symptoms:   denies decreased need for sleep, euphoria ?Anxiety Symptoms:  Excessive Worry, ?Panic Symptoms, ?Psychotic Symptoms:   denies AH, VH, paranoia ?PTSD Symptoms: ?Had a  traumatic exposure:  some trama in childhood ?Re-experiencing:  None ?Hypervigilance:  No ?Hyperarousal:  None ?Avoidance:  None ? ? ?Past Psychiatric History:  ?Outpatient: depression, anxiety for many years ?Psychiatry admission: denies  ?Previous suicide attempt: denies ?Past trials of medication: sertraline (drowsiness), lexapro (vertigo) ?History of violence:   ? ?Previous Psychotropic Medications: Yes  ? ?Substance Abuse History in the last 12 months:  No. ? ?Consequences of Substance Abuse: ?NA ? ?Past Medical History:  ?Past Medical History:  ?Diagnosis Date  ? Depression   ? GERD (gastroesophageal reflux disease)   ? History of kidney stones   ? Hyperlipidemia   ? Kidney stone   ? Pneumonia   ? Treadmill stress test negative for angina pectoris 12/18/2010  ?  ?Past Surgical History:  ?Procedure Laterality Date  ? ABDOMINAL HYSTERECTOMY  2001  ? with 1 ovary removed; remaining over removed a yr later  ? CHOLECYSTECTOMY  2002  ? COLONOSCOPY    ? EXTRACORPOREAL SHOCK WAVE LITHOTRIPSY Left 06/27/2020  ? Procedure: EXTRACORPOREAL SHOCK WAVE LITHOTRIPSY (ESWL);  Surgeon: Abbie Sons, MD;  Location: [facility] ORS;  Service: Urology;  Laterality: Left;  ? UPPER GI ENDOSCOPY  2019  ? ? ?Family Psychiatric History: as below ? ?Family History:  ?Family History  ?Problem Relation Age of Onset  ? COPD Mother   ? Heart attack Father 31  ? Hypertension Father   ? Heart disease Father   ?     stent placement   ? Congenital heart disease Sister   ? Leukemia Brother   ? Anxiety disorder Maternal Uncle   ? Alcohol abuse Maternal Uncle   ? Kidney failure Maternal Grandfather   ? COPD Paternal Grandmother   ? Breast cancer Neg Hx   ? ? ?Social History:   ?Social History  ? ?Socioeconomic History  ? Marital status: Married  ?  Spouse name: Mia Creek  ? Number of children: Not on file  ? Years of education: Not on file  ? Highest education level: Not on file  ?Occupational History  ? Occupation: Physiological scientist  ?   Employer: [employer]  ?  Comment: Urgent Care in [place]  ?Tobacco Use  ? Smoking status: Every Day  ?  Packs/day: 0.25  ?  Years: 30.00  ?  Pack years: 7.50  ?  Types: Cigarettes  ? Smokeless tobacco: Never  ?Vaping Use  ? Vaping Use: Never used  ?Substance and Sexual Activity  ? Alcohol use: Yes  ?  Alcohol/week: 2.0 standard drinks  ?  Types: 1 Glasses of wine, 1 Cans of beer per week  ?  Comment: beer ocassionally  ? Drug use: No  ? Sexual activity: Yes  ?  Birth control/protection: Surgical  ?  Comment: Hsyterectomy  ?Other Topics Concern  ? Not on file  ?Social History Narrative  ? Regular exercise- yes, occ walking  ? Diet- fruits and veggies, water   ? ?Social Determinants of Health  ? ?Financial Resource Strain: Not  on file  ?Food Insecurity: Not on file  ?Transportation Needs: Not on file  ?Physical Activity: Not on file  ?Stress: Not on file  ?Social Connections: Not on file  ? ? ?Additional Social History: as above ? ?Allergies:   ?Allergies  ?Allergen Reactions  ? Metoclopramide Hcl   ?  REACTION: Nervousness  ? Morphine And Related Nausea Only  ? Venlafaxine Other (See Comments)  ?  Hot flashes, clammy and nausea  ? ? ?Metabolic Disorder Labs: ?Lab Results  ?Component Value Date  ? HGBA1C 5.8 10/08/2020  ? ?No results found for: PROLACTIN ?Lab Results  ?Component Value Date  ? CHOL 255 (H) 06/24/2021  ? TRIG 224.0 (H) 06/24/2021  ? HDL 54.00 06/24/2021  ? CHOLHDL 5 06/24/2021  ? VLDL 44.8 (H) 06/24/2021  ? LDLCALC 118 (H) 10/10/2019  ? LDLCALC 171 (H) 01/26/2017  ? ?Lab Results  ?Component Value Date  ? TSH 1.28 10/08/2020  ? ? ?Therapeutic Level Labs: ?No results found for: LITHIUM ?No results found for: CBMZ ?No results found for: VALPROATE ? ?Current Medications: ?Current Outpatient Medications  ?Medication Sig Dispense Refill  ? sertraline (ZOLOFT) 50 MG tablet 25 mg daily at night, then 50 mg at night 30 tablet 1  ? amoxicillin (AMOXIL) 500 MG capsule Take 2 capsules (1,000 mg total) by mouth 2 (two)  times daily. 40 capsule 0  ? aspirin 81 MG chewable tablet Chew 81 mg by mouth daily.    ? BIOTIN PO Take 1 tablet by mouth daily.    ? BLACK CURRANT SEED OIL PO Take 5 mLs by mouth daily.    ? calcium carbonate (T

## 2021-11-12 ENCOUNTER — Encounter: Payer: Self-pay | Admitting: Psychiatry

## 2021-11-12 ENCOUNTER — Ambulatory Visit (INDEPENDENT_AMBULATORY_CARE_PROVIDER_SITE_OTHER): Payer: 59 | Admitting: Psychiatry

## 2021-11-12 ENCOUNTER — Other Ambulatory Visit: Payer: Self-pay | Admitting: Psychiatry

## 2021-11-12 DIAGNOSIS — F331 Major depressive disorder, recurrent, moderate: Secondary | ICD-10-CM

## 2021-11-12 DIAGNOSIS — F411 Generalized anxiety disorder: Secondary | ICD-10-CM | POA: Diagnosis not present

## 2021-11-12 DIAGNOSIS — F410 Panic disorder [episodic paroxysmal anxiety] without agoraphobia: Secondary | ICD-10-CM

## 2021-11-12 MED ORDER — SERTRALINE HCL 50 MG PO TABS: ORAL_TABLET | ORAL | 1 refills | Status: DC

## 2021-11-12 NOTE — Patient Instructions (Signed)
Start sertraline 25 mg at night for one week, then 50 mg a night  ?Continue diazepam 2.5 mg daily as needed for anxiety- she declined refill ?Next appointment: 5/18 at 3:30, in person ? ?The next visit will be in person visit. Please arrive 15 mins before the scheduled time.  ? ?[facility]  ?Address: [address], [place], [place] [address]   ?

## 2021-11-15 ENCOUNTER — Emergency Department
Admission: EM | Admit: 2021-11-15 | Discharge: 2021-11-15 | Disposition: A | Discharge status: Home / Self Care | Payer: 59 | Attending: Emergency Medicine | Admitting: Emergency Medicine

## 2021-11-15 ENCOUNTER — Other Ambulatory Visit: Payer: Self-pay

## 2021-11-15 DIAGNOSIS — L299 Pruritus, unspecified: Secondary | ICD-10-CM | POA: Diagnosis present

## 2021-11-15 DIAGNOSIS — L259 Unspecified contact dermatitis, unspecified cause: Secondary | ICD-10-CM | POA: Diagnosis not present

## 2021-11-15 LAB — BASIC METABOLIC PANEL
Anion gap: 9 (ref 5–15)
BUN: 18 mg/dL (ref 6–20)
CO2: 26 mmol/L (ref 22–32)
Calcium: 9.2 mg/dL (ref 8.9–10.3)
Chloride: 103 mmol/L (ref 98–111)
Creatinine, Ser: 0.84 mg/dL (ref 0.44–1.00)
GFR, Estimated: >60 mL/min (ref >60)
Glucose, Bld: 84 mg/dL (ref 70–99)
Potassium: 4 mmol/L (ref 3.5–5.1)
Sodium: 138 mmol/L (ref 135–145)

## 2021-11-15 LAB — CBC WITH DIFFERENTIAL/PLATELET
Abs Immature Granulocytes: 0.03 10*3/uL (ref 0.00–0.07)
Basophils Absolute: 0 10*3/uL (ref 0.0–0.1)
Basophils Relative: 1 %
Eosinophils Absolute: 0.1 10*3/uL (ref 0.0–0.5)
Eosinophils Relative: 2 %
HCT: 42 % (ref 36.0–46.0)
Hemoglobin: 13.7 g/dL (ref 12.0–15.0)
Immature Granulocytes: 0 %
Lymphocytes Relative: 33 %
Lymphs Abs: 2.6 10*3/uL (ref 0.7–4.0)
MCH: 30.3 pg (ref 26.0–34.0)
MCHC: 32.6 g/dL (ref 30.0–36.0)
MCV: 92.9 fL (ref 80.0–100.0)
Monocytes Absolute: 0.5 10*3/uL (ref 0.1–1.0)
Monocytes Relative: 7 %
Neutro Abs: 4.7 10*3/uL (ref 1.7–7.7)
Neutrophils Relative %: 57 %
Platelets: 264 10*3/uL (ref 150–400)
RBC: 4.52 MIL/uL (ref 3.87–5.11)
RDW: 13 % (ref 11.5–15.5)
WBC: 8.1 10*3/uL (ref 4.0–10.5)
nRBC: 0 % (ref 0.0–0.2)

## 2021-11-15 MED ORDER — TRIAMCINOLONE ACETONIDE 0.1 % EX CREA: 1.0000 "application " | TOPICAL_CREAM | Freq: Two times a day (BID) | CUTANEOUS | 1 refills | Status: DC

## 2021-11-15 NOTE — ED Provider Notes (Signed)
? ?[facility] ?Provider Note ? ? ? Event Date/Time  ? First MD Initiated Contact with Patient 11/15/21 1417   ?  (approximate) ? ? ?History  ? ?Insect Bite ? ? ?HPI ? ?Daily Rebecca Allen is a 56 y.o. female   presents to the ED with complaint of possible insect bite.  Patient noticed a area to the left of her neck that itches and burns but states it is very painful also.  Patient did not actually see an insect. ? ?  ? ? ?Physical Exam  ? ?Triage Vital Signs: ?ED Triage Vitals  ?Enc Vitals Group  ?   BP 11/15/21 1246 138/85  ?   Pulse Rate 11/15/21 1246 76  ?   Resp 11/15/21 1246 16  ?   Temp 11/15/21 1246 98.1 ?F (36.7 ?C)  ?   Temp Source 11/15/21 1246 Oral  ?   SpO2 11/15/21 1246 96 %  ?   Weight 11/15/21 1248 170 lb (77.1 kg)  ?   Height 11/15/21 1248 '5\' 5"'$  (1.651 m)  ?   Head Circumference --   ?   Peak Flow --   ?   Pain Score 11/15/21 1247 3  ?   Pain Loc --   ?   Pain Edu? --   ?   Excl. in [place]? --   ? ? ?Most recent vital signs: ?Vitals:  ? 11/15/21 1246  ?BP: 138/85  ?Pulse: 76  ?Resp: 16  ?Temp: 98.1 ?F (36.7 ?C)  ?SpO2: 96%  ? ? ? ?General: Awake, no distress.  ?CV:  Good peripheral perfusion.  ?Resp:  Normal effort.  Lungs are clear bilaterally. ?Abd:  No distention.  ?Other:  At the base of the left lateral neck there is a single erythematous linear lesion.  No vesicles are present at this time, no pustules. ? ? ?ED Results / Procedures / Treatments  ? ?Labs ?(all labs ordered are listed, but only abnormal results are displayed) ?Labs Reviewed  ?CBC WITH DIFFERENTIAL/PLATELET  ?BASIC METABOLIC PANEL  ? ? ? ? ?PROCEDURES: ? ?Critical Care performed:  ? ?Procedures ? ? ?MEDICATIONS ORDERED IN ED: ?Medications - No data to display ? ? ?IMPRESSION / MDM / ASSESSMENT AND PLAN / ED COURSE  ?I reviewed the triage vital signs and the nursing notes. ? ? ?Differential diagnosis includes, but is not limited to, insect bite, contact dermatitis, early herpes zoster. ? ?56 year old female  presents to the ED with a single linear erythematous lesion at the base of her neck on the left side.  Patient had recently been camping and thought initially that it was a bug bite.  Patient reports that it itches, burns and at times is painful.  Lab work is completely normal.  Patient was reassured and at this time will treat with triamcinolone cream.  She is aware that should she start developing any vesicular lesions that this is more inclined to be shingles. ? ? ? ?FINAL CLINICAL IMPRESSION(S) / ED DIAGNOSES  ? ?Final diagnoses:  ?Contact dermatitis, unspecified contact dermatitis type, unspecified trigger  ? ? ? ?Rx / DC Orders  ? ?ED Discharge Orders   ? ?      Ordered  ?  triamcinolone cream (KENALOG) 0.1 %  2 times daily       ? 11/15/21 1607  ? ?  ?  ? ?  ? ? ? ?Note:  This document was prepared using Dragon voice recognition software and may include unintentional dictation  errors. ?  ?Johnn Hai, PA-C ?11/15/21 1612 ? ?  ?Nena Polio, MD ?11/15/21 1633 ? ?

## 2021-11-15 NOTE — ED Triage Notes (Incomplete)
Pt to ED POV states was camping and got insect bite, possibly spider bite, yesterday. States since this morning she noticed a line of redness and swelling from bite extending about 4cm down neck on L side. States that whole L side of neck and shoulder and behind L ear is slightly painful and tender.  ? ?Pt tried using topical hydrocortisone cream with no relief. ? ?Breathing is unlabored. Pt in NAD, ambulatory to triage room. ?

## 2021-11-18 ENCOUNTER — Ambulatory Visit: Payer: 59 | Admitting: Family Medicine

## 2021-11-18 VITALS — BP 104/66 | HR 82 | Temp 96.9°F | Resp 14 | Ht 65.0 in | Wt 180.2 lb

## 2021-11-18 DIAGNOSIS — F331 Major depressive disorder, recurrent, moderate: Secondary | ICD-10-CM

## 2021-11-18 DIAGNOSIS — R21 Rash and other nonspecific skin eruption: Secondary | ICD-10-CM

## 2021-11-18 DIAGNOSIS — F410 Panic disorder [episodic paroxysmal anxiety] without agoraphobia: Secondary | ICD-10-CM

## 2021-11-18 DIAGNOSIS — F411 Generalized anxiety disorder: Secondary | ICD-10-CM | POA: Diagnosis not present

## 2021-11-18 DIAGNOSIS — K769 Liver disease, unspecified: Secondary | ICD-10-CM

## 2021-11-18 DIAGNOSIS — L301 Dyshidrosis [pompholyx]: Secondary | ICD-10-CM

## 2021-11-18 DIAGNOSIS — R911 Solitary pulmonary nodule: Secondary | ICD-10-CM | POA: Diagnosis not present

## 2021-11-18 LAB — TSH: TSH: 0.96 u[IU]/mL (ref 0.35–5.50)

## 2021-11-18 MED ORDER — BETAMETHASONE DIPROPIONATE 0.05 % EX CREA: TOPICAL_CREAM | Freq: Two times a day (BID) | CUTANEOUS | 0 refills | Status: DC

## 2021-11-18 NOTE — Progress Notes (Signed)
? ? Patient ID: Rebecca Allen, female    DOB: March 01, 1966, 56 y.o.   MRN: [id number] ? ?This visit was conducted in person. ? ?BP 104/66   Pulse 82   Temp (!) 96.9 ?F (36.1 ?C)   Resp 14   Ht '5\' 5"'$  (1.651 m)   Wt 180 lb 3 oz (81.7 kg)   SpO2 99%   BMI 29.98 kg/m?   ? ?CC:  ?Chief Complaint  ?Patient presents with  ? ER follow up  ? ? ?Subjective:  ? ?HPI: ?Rebecca Allen is a 56 y.o. female presenting on 11/18/2021 for ER follow up ? ? Seen in ED on 3/18 for atypical chest pain ? Reviewed note in detail ? EKG was unremarkable. ? Ddimer neg, troponin neg. ? CXR unremarkable ?Felt better after valium. ? ? Seen again in  ED  on 4/8 for  contact dermatitis ? She thinks she was bitten on camping trip. ? Very itchy. ? Given topical steroid.. no spreading. ? ?She had an appt on 4/5 with psychiatry 11/12/2021 Dr. Modesta Messing ? Started on sertraline ( she has not started yet), continued valium started in ED. ?Requested TSH. ?   ?Lab Results  ?Component Value Date  ? TSH 1.28 10/08/2020  ? ? Still has dyshidrotic eczema... triamcinolone cream. ? Has appt with derm in 03/2022 ? ? ?Relevant past medical, surgical, family and social history reviewed and updated as indicated. Interim medical history since our last visit reviewed. ?Allergies and medications reviewed and updated. ?Outpatient Medications Prior to Visit  ?Medication Sig Dispense Refill  ? aspirin 81 MG chewable tablet Chew 81 mg by mouth daily.    ? calcium carbonate (TUMS - DOSED IN MG ELEMENTAL CALCIUM) 500 MG chewable tablet Chew 6 tablets by mouth daily as needed for indigestion or heartburn.    ? diazepam (VALIUM) 5 MG tablet Take 1 tablet (5 mg total) by mouth daily as needed for anxiety. 30 tablet 0  ? estradiol (VIVELLE-DOT) 0.05 MG/24HR patch PLACE 1 PATCH TRANS-DERMALLY TWICE A WEEK. REMOVE OLD PATCH BEFORE APPLYING NEW PATCH. 24 patch 3  ? pantoprazole (PROTONIX) 40 MG tablet Take 1 tablet (40 mg total) by mouth daily. 90 tablet 3  ?  triamcinolone cream (KENALOG) 0.1 % Apply 1 application. topically 2 (two) times daily. 15 g 1  ? sertraline (ZOLOFT) 50 MG tablet 25 mg at night for one week, then 50 mg at night (Patient not taking: Reported on 11/18/2021) 30 tablet 1  ? amoxicillin (AMOXIL) 500 MG capsule Take 2 capsules (1,000 mg total) by mouth 2 (two) times daily. 40 capsule 0  ? BIOTIN PO Take 1 tablet by mouth daily.    ? BLACK CURRANT SEED OIL PO Take 5 mLs by mouth daily.    ? Cholecalciferol (VITAMIN D3) 25 MCG (1000 UT) CAPS Take by mouth.    ? meclizine (ANTIVERT) 25 MG tablet Take 1 tablet (25 mg total) by mouth 3 (three) times daily as needed for dizziness. 30 tablet 0  ? MILK THISTLE PO Take 1 tablet by mouth daily.    ? Misc Natural Products (ELDERBERRY ZINC/VIT C/IMMUNE MT) Use as directed 1 tablet in the mouth or throat daily.    ? ?No facility-administered medications prior to visit.  ?  ? ?Per HPI unless specifically indicated in ROS section below ?Review of Systems  ?Constitutional:  Negative for fatigue and fever.  ?HENT:  Negative for ear pain.   ?Eyes:  Negative for pain.  ?Respiratory:  Negative for chest tightness and shortness of breath.   ?Cardiovascular:  Negative for chest pain, palpitations and leg swelling.  ?Gastrointestinal:  Negative for abdominal pain.  ?Genitourinary:  Negative for dysuria.  ?Objective:  ?BP 104/66   Pulse 82   Temp (!) 96.9 ?F (36.1 ?C)   Resp 14   Ht '5\' 5"'$  (1.651 m)   Wt 180 lb 3 oz (81.7 kg)   SpO2 99%   BMI 29.98 kg/m?   ?Wt Readings from Last 3 Encounters:  ?11/18/21 180 lb 3 oz (81.7 kg)  ?11/15/21 170 lb (77.1 kg)  ?10/26/21 170 lb (77.1 kg)  ?  ?  ?Physical Exam ?Constitutional:   ?   General: She is not in acute distress. ?   Appearance: Normal appearance. She is well-developed. She is not ill-appearing or toxic-appearing.  ?HENT:  ?   Head: Normocephalic.  ?   Right Ear: Hearing, tympanic membrane, ear canal and external ear normal. Tympanic membrane is not erythematous,  retracted or bulging.  ?   Left Ear: Hearing, tympanic membrane, ear canal and external ear normal. Tympanic membrane is not erythematous, retracted or bulging.  ?   Nose: No mucosal edema or rhinorrhea.  ?   Right Sinus: No maxillary sinus tenderness or frontal sinus tenderness.  ?   Left Sinus: No maxillary sinus tenderness or frontal sinus tenderness.  ?   Mouth/Throat:  ?   Pharynx: Uvula midline.  ?Eyes:  ?   General: Lids are normal. Lids are everted, no foreign bodies appreciated.  ?   Conjunctiva/sclera: Conjunctivae normal.  ?   Pupils: Pupils are equal, round, and reactive to light.  ?Neck:  ?   Thyroid: No thyroid mass or thyromegaly.  ?   Vascular: No carotid bruit.  ?   Trachea: Trachea normal.  ?Cardiovascular:  ?   Rate and Rhythm: Normal rate and regular rhythm.  ?   Pulses: Normal pulses.  ?   Heart sounds: Normal heart sounds, S1 normal and S2 normal. No murmur heard. ?  No friction rub. No gallop.  ?Pulmonary:  ?   Effort: Pulmonary effort is normal. No tachypnea or respiratory distress.  ?   Breath sounds: Normal breath sounds. No decreased breath sounds, wheezing, rhonchi or rales.  ?Abdominal:  ?   General: Bowel sounds are normal.  ?   Palpations: Abdomen is soft.  ?   Tenderness: There is no abdominal tenderness.  ?Musculoskeletal:  ?   Cervical back: Normal range of motion and neck supple.  ?Skin: ?   General: Skin is warm and dry.  ?   Findings: No rash.  ?Neurological:  ?   Mental Status: She is alert.  ?Psychiatric:     ?   Mood and Affect: Mood is anxious. Mood is not depressed.     ?   Speech: Speech normal.     ?   Behavior: Behavior is agitated. Behavior is cooperative.     ?   Thought Content: Thought content normal.     ?   Judgment: Judgment normal.  ? ?   ?Results for orders placed or performed during the hospital encounter of 11/15/21  ?CBC with Differential  ?Result Value Ref Range  ? WBC 8.1 4.0 - 10.5 K/uL  ? RBC 4.52 3.87 - 5.11 MIL/uL  ? Hemoglobin 13.7 12.0 - 15.0 g/dL  ?  HCT 42.0 36.0 - 46.0 %  ? MCV 92.9 80.0 - 100.0 fL  ? MCH 30.3 26.0 -  34.0 pg  ? MCHC 32.6 30.0 - 36.0 g/dL  ? RDW 13.0 11.5 - 15.5 %  ? Platelets 264 150 - 400 K/uL  ? nRBC 0.0 0.0 - 0.2 %  ? Neutrophils Relative % 57 %  ? Neutro Abs 4.7 1.7 - 7.7 K/uL  ? Lymphocytes Relative 33 %  ? Lymphs Abs 2.6 0.7 - 4.0 K/uL  ? Monocytes Relative 7 %  ? Monocytes Absolute 0.5 0.1 - 1.0 K/uL  ? Eosinophils Relative 2 %  ? Eosinophils Absolute 0.1 0.0 - 0.5 K/uL  ? Basophils Relative 1 %  ? Basophils Absolute 0.0 0.0 - 0.1 K/uL  ? Immature Granulocytes 0 %  ? Abs Immature Granulocytes 0.03 0.00 - 0.07 K/uL  ?Basic metabolic panel  ?Result Value Ref Range  ? Sodium 138 135 - 145 mmol/L  ? Potassium 4.0 3.5 - 5.1 mmol/L  ? Chloride 103 98 - 111 mmol/L  ? CO2 26 22 - 32 mmol/L  ? Glucose, Bld 84 70 - 99 mg/dL  ? BUN 18 6 - 20 mg/dL  ? Creatinine, Ser 0.84 0.44 - 1.00 mg/dL  ? Calcium 9.2 8.9 - 10.3 mg/dL  ? GFR, Estimated >60 >60 mL/min  ? Anion gap 9 5 - 15  ? ? ?This visit occurred during the SARS-CoV-2 public health emergency.  Safety protocols were in place, including screening questions prior to the visit, additional usage of staff PPE, and extensive cleaning of exam room while observing appropriate contact time as indicated for disinfecting solutions.  ? ?COVID 19 screen:  No recent travel or known exposure to [place] ?The patient denies respiratory symptoms of COVID 19 at this time. ?The importance of social distancing was discussed today.  ? ?Assessment and Plan ?Problem List Items Addressed This Visit   ? ? Current moderate episode of major depressive disorder ([place]) - Primary  ?   Chronic, inadequate control ? Followed by Psychiatry. ?Now on low dose sertraline 50 mg daily. ?Check TSH for secondary cause. ? ?  ?  ? Generalized anxiety disorder  ? Panic attacks  ? Relevant Orders  ? TSH (Completed)  ? Rash  ?   Acute, recurrent. ? ?Can try a trial of betamethasone for hand rash ?  ?  ? ?Other Visit Diagnoses   ? ? Pulmonary  nodule less than 6 mm determined by computed tomography of lung      ? Liver lesion      ? Dyshidrotic eczema      ? ?  ? ? ?  ? ?Eliezer Lofts, MD  ? ?

## 2021-11-18 NOTE — Patient Instructions (Addendum)
Please stop at the lab to have labs drawn. ?Can try a trial of betamethasone for hand rash. ?

## 2021-11-20 ENCOUNTER — Telehealth: Payer: Self-pay

## 2021-11-20 ENCOUNTER — Other Ambulatory Visit: Payer: Self-pay | Admitting: Psychiatry

## 2021-11-20 MED ORDER — FLUOXETINE HCL 10 MG PO CAPS: 10.0000 mg | ORAL_CAPSULE | Freq: Every day | ORAL | 0 refills | Status: DC

## 2021-11-20 MED ORDER — FLUOXETINE HCL 20 MG PO CAPS: 20.0000 mg | ORAL_CAPSULE | Freq: Every day | ORAL | 0 refills | Status: DC

## 2021-11-20 NOTE — Telephone Encounter (Signed)
Discussed with the patient.  She states that she cannot function due to drowsiness after starting sertraline at night. She took the dose last night. She is willing to try fluoxetine.  Advised the following.  ?-Start fluoxetine 10 mg daily for 1 week, then 20 mg

## 2021-11-20 NOTE — Telephone Encounter (Signed)
fluo

## 2021-11-20 NOTE — Telephone Encounter (Signed)
pt called left a message to let you know that she can not take the zoloft.she "can not function" would like to speak with you about trying something else.  ?

## 2021-12-04 ENCOUNTER — Other Ambulatory Visit: Payer: Self-pay | Admitting: Psychiatry

## 2021-12-15 NOTE — Assessment & Plan Note (Signed)
Chronic, inadequate control ? Followed by Psychiatry. ?Now on low dose sertraline 50 mg daily. ?Check TSH for secondary cause. ?

## 2021-12-15 NOTE — Assessment & Plan Note (Addendum)
Acute, recurrent. ? ?Can try a trial of betamethasone for hand rash ?

## 2021-12-22 NOTE — Progress Notes (Signed)
BH MD/PA/NP OP Progress Note  12/25/2021 4:10 PM Rebecca Allen  MRN:  [id number]  Chief Complaint:  Chief Complaint  Patient presents with   Follow-up   HPI:  - Discussed with the patient on the phone since the last visit. She states that she cannot function due to drowsiness after starting sertraline at night. She took the dose last night. She is willing to try fluoxetine.  Advised the following.  -Start fluoxetine 10 mg daily for 1 week, then 20 mg  This is a follow-up appointment for depression.  She states that she could not take fluoxetine, which made her feel weird.  She works part-time.  She thinks about her daughter, Quillian Quince all the time.  She felt different when she did not get a phone call from her on Mother's Day.  She reports conflict with Quillian Quince since she was a teenager in relation to her substance use.  She occasionally feels guilty that she could have done something for her, although she knows that it is not true.  She does have time with her other daughter, and her grandchild.  She will be going on a cruise with her husband.  Although she feels nervous, she is looking forward to it.  She has depressive symptoms as in PHQ-9.  She denies SI.  She feels anxious and tense.  She had intense anxiety a few days ago; she took Valium, and she has been doing better since then.  She agrees to take Valium only for short-term.  After discussing treatment options, she agrees to try bupropion.   Daily routine: work, doing house chores Diet:  Exercise: Support:husband Household: husband, 3 dogs Marital status:married for 30 years Number of children: 3 2022-11-30, 54, 34-deceased) Employment: Engineer, maintenance for 27 years Education:  high school Last PCP / ongoing medical evaluation:   She was born in [place], grew up in [place]. She moved to [facility] in 2008 for her husband work She states that her mother died when she was 38 year old.  She has estranged relationship with her biological  father.  She was raised by her maternal uncle and his wife, who she calls as her parents.  Her uncle had issues with alcohol, which created conflict in their marriage, and she was protective of her sibling.  Wt Readings from Last 3 Encounters:  12/25/21 179 lb 6.4 oz (81.4 kg)  11/18/21 180 lb 3 oz (81.7 kg)  11/15/21 170 lb (77.1 kg)      Visit Diagnosis:    ICD-10-CM   1. Moderate episode of recurrent major depressive disorder (HCC)  F33.1     2. Panic disorder  F41.0     3. Generalized anxiety disorder  F41.1       Past Psychiatric History: Please see initial evaluation for full details. I have reviewed the history. No updates at this time.     Past Medical History:  Past Medical History:  Diagnosis Date   Depression    GERD (gastroesophageal reflux disease)    History of kidney stones    Hyperlipidemia    Kidney stone    Pneumonia    Treadmill stress test negative for angina pectoris 12/18/2010    Past Surgical History:  Procedure Laterality Date   ABDOMINAL HYSTERECTOMY  2001   with 1 ovary removed; remaining over removed a yr later   CHOLECYSTECTOMY  2002   [place] LITHOTRIPSY Left 06/27/2020   Procedure: EXTRACORPOREAL SHOCK WAVE LITHOTRIPSY (ESWL);  Surgeon: Abbie Sons, MD;  Location: [facility] ORS;  Service: Urology;  Laterality: Left;   UPPER GI ENDOSCOPY  2019    Family Psychiatric History: Please see initial evaluation for full details. I have reviewed the history. No updates at this time.     Family History:  Family History  Problem Relation Age of Onset   COPD Mother    Heart attack Father 91   Hypertension Father    Heart disease Father        stent placement    Congenital heart disease Sister    Leukemia Brother    Anxiety disorder Maternal Uncle    Alcohol abuse Maternal Uncle    Kidney failure Maternal Grandfather    COPD Paternal Grandmother    Breast cancer Neg Hx     Social History:  Social History    Socioeconomic History   Marital status: Married    Spouse name: Mia Creek   Number of children: Not on file   Years of education: Not on file   Highest education level: Not on file  Occupational History   Occupation: Research scientist (life sciences): [employer]    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 and liquor sometimes   Drug use: No   Sexual activity: Yes    Birth control/protection: Surgical    Comment: Hsyterectomy  Other Topics Concern   Not on file  Social History Narrative   Regular exercise- yes, occ walking   Diet- fruits and veggies, water    Social Determinants of Health   Financial Resource Strain: Not on file  Food Insecurity: Not on file  Transportation Needs: Not on file  Physical Activity: Not on file  Stress: Not on file  Social Connections: Not on file    Allergies:  Allergies  Allergen Reactions   Metoclopramide Hcl     REACTION: Nervousness   Morphine And Related Nausea Only   Venlafaxine Other (See Comments)    Hot flashes, clammy and nausea    Metabolic Disorder Labs: Lab Results  Component Value Date   HGBA1C 5.8 10/08/2020   No results found for: PROLACTIN Lab Results  Component Value Date   CHOL 255 (H) 06/24/2021   TRIG 224.0 (H) 06/24/2021   HDL 54.00 06/24/2021   CHOLHDL 5 06/24/2021   VLDL 44.8 (H) 06/24/2021   LDLCALC 118 (H) 10/10/2019   LDLCALC 171 (H) 01/26/2017   Lab Results  Component Value Date   TSH 0.96 11/18/2021   TSH 1.28 10/08/2020    Therapeutic Level Labs: No results found for: LITHIUM No results found for: VALPROATE No components found for:  CBMZ  Current  Medications: Current Outpatient Medications  Medication Sig Dispense Refill   aspirin 81 MG chewable tablet Chew 81 mg by mouth daily.     betamethasone dipropionate 0.05 % cream Apply topically 2 (two) times daily. 30 g 0   buPROPion (WELLBUTRIN) 75 MG tablet Take 1 tablet (75 mg total) by mouth daily. 30 tablet 1   calcium carbonate (TUMS - DOSED IN  MG ELEMENTAL CALCIUM) 500 MG chewable tablet Chew 6 tablets by mouth daily as needed for indigestion or heartburn.     diazepam (VALIUM) 5 MG tablet Take 1 tablet (5 mg total) by mouth daily as needed for anxiety. 30 tablet 0   diazepam (VALIUM) 5 MG tablet Take 0.5 tablets (2.5 mg total) by mouth daily as needed for anxiety. 15 tablet 0   estradiol (VIVELLE-DOT) 0.05 MG/24HR patch PLACE 1 PATCH TRANS-DERMALLY TWICE A WEEK. REMOVE OLD PATCH BEFORE APPLYING NEW PATCH. 24 patch 3   pantoprazole (PROTONIX) 40 MG tablet Take 1 tablet (40 mg total) by mouth daily. 90 tablet 3   triamcinolone cream (KENALOG) 0.1 % Apply 1 application. topically 2 (two) times daily. 15 g 1   No current facility-administered medications for this visit.     Musculoskeletal: Strength & Muscle Tone: within normal limits Gait & Station: normal Patient leans: N/A  Psychiatric Specialty Exam: Review of Systems  Psychiatric/Behavioral:  Positive for dysphoric mood and sleep disturbance. Negative for agitation, behavioral problems, confusion, decreased concentration, hallucinations, self-injury and suicidal ideas. The patient is nervous/anxious. The patient is not hyperactive.   All other systems reviewed and are negative.  Blood pressure 100/68, pulse 85, temperature 98.4 F (36.9 C), temperature source Oral, weight 179 lb 6.4 oz (81.4 kg).Body mass index is 29.85 kg/m.  General Appearance: Fairly Groomed  Eye Contact:  Good  Speech:  Clear and Coherent  Volume:  Normal  Mood:   anxious  Affect:  Appropriate, Congruent, and Full Range  Thought Process:  Coherent   Orientation:  Full (Time, Place, and Person)  Thought Content: Logical   Suicidal Thoughts:  No  Homicidal Thoughts:  No  Memory:  Immediate;   Good  Judgement:  Good  Insight:  Good  Psychomotor Activity:  Normal  Concentration:  Concentration: Good and Attention Span: Good  Recall:  Good  Fund of Knowledge: Good  Language: Good  Akathisia:  No  Handed:  Right  AIMS (if indicated): not done  Assets:  Communication Skills Desire for Improvement  ADL's:  Intact  Cognition: WNL  Sleep:  Poor   Screenings: GAD-7    Flowsheet Row Office Visit from 11/18/2021 in [place] at [facility] Visit from 08/19/2021 in [place] at [place] Office Visit from 06/24/2021 in [place] at [facility] Visit from 02/27/2021 in [place] at [place] from 10/08/2020 in [place] at [facility]  Total GAD-7 Score '20 18 18 21 13      '$ [place] Office Visit from 12/25/2021 in [place] Office Visit from 11/18/2021 in [place] at [place] Office Visit from 08/19/2021 in [place] at [facility] Visit from 06/24/2021 in [place] at [facility] Visit from 02/27/2021 in [place] at [place]  PHQ-2 Total Score '2 6 5 2 3  '$ PHQ-9 Total Score '11 20 19 14 18      '$ [place] Office Visit from 12/25/2021 in [place] ED from 11/15/2021 in [place] ED from 10/26/2021 in [place] No Risk No Risk No Risk        Assessment and Plan:  Rebecca Allen is a 56 y.o. year old female with a history of depression, anxiety, hysterectomy, hyperlipidemia, who presents for follow up appointment for below.   1. Moderate episode  of recurrent major depressive disorder ([place]) 2. Panic disorder 3.  Generalized anxiety disorder She continues to report depressive symptoms and anxiety, panic attacks since the last visit.  Psychosocial stressors includes loss of her daughter from following the overdose in August 2020, upcoming wedding of her daughter, and recent birth of her grandchild.  She could not tolerate sertraline, duloxetine due to adverse reaction. She is not interested in [place]. Will start bupropion to target depression. Will start from lowest dose given her history of adverse reaction to SSRI/SNRI.  Discussed potential risk of headache, palpitation, worsening in anxiety.  She has no known history of seizure. Will continue valium prn for anxiety.  She agrees that this medication to be used for only short-term/emergency use given its risk.     # Insomnia She reports middle insomnia and snoring.  She reportedly has sleep study 7 years ago, which was not consistent with OSA.  Although she was advised to have another evaluation, she declined this at this time.    Plan Discontinue fluoxetine Start bupropion 75 mg daily  Continue diazepam 2.5 mg daily as needed for anxiety Next appointment:  6/20 at 3:30 for 30 mins, in person Will check with the patient if she checked TSH with her PCP at the next visit   Past trials of medication: sertraline (drowsiness), fluoxetine ("weird"), lexapro (vertigo), venlafaxine (jittery)   The patient demonstrates the following risk factors for suicide: Chronic risk factors for suicide include: psychiatric disorder of depression, anxiety  . Acute risk factors for suicide include: loss (financial, interpersonal, professional). Protective factors for this patient include: positive social support, coping skills, and hope for the future. Considering these factors, the overall suicide risk at this point appears to be low. Patient is appropriate for outpatient follow up.     Collaboration of Care: Collaboration of Care: Other N/A  Patient/Guardian was advised Release  of Information must be obtained prior to any record release in order to collaborate their care with an outside provider. Patient/Guardian was advised if they have not already done so to contact the registration department to sign all necessary forms in order for [place] to release information regarding their care.   Consent: Patient/Guardian gives verbal consent for treatment and assignment of benefits for services provided during this visit. Patient/Guardian expressed understanding and agreed to proceed.    Norman Clay, MD 12/25/2021, 4:10 PM

## 2021-12-25 ENCOUNTER — Encounter: Payer: Self-pay | Admitting: Psychiatry

## 2021-12-25 ENCOUNTER — Ambulatory Visit (INDEPENDENT_AMBULATORY_CARE_PROVIDER_SITE_OTHER): Payer: 59 | Admitting: Psychiatry

## 2021-12-25 VITALS — BP 100/68 | HR 85 | Temp 98.4°F | Wt 179.4 lb

## 2021-12-25 DIAGNOSIS — F411 Generalized anxiety disorder: Secondary | ICD-10-CM

## 2021-12-25 DIAGNOSIS — F410 Panic disorder [episodic paroxysmal anxiety] without agoraphobia: Secondary | ICD-10-CM

## 2021-12-25 DIAGNOSIS — F331 Major depressive disorder, recurrent, moderate: Secondary | ICD-10-CM

## 2021-12-25 MED ORDER — DIAZEPAM 5 MG PO TABS: 2.5000 mg | ORAL_TABLET | Freq: Every day | ORAL | 0 refills | Status: AC | PRN

## 2021-12-25 MED ORDER — BUPROPION HCL 75 MG PO TABS: 75.0000 mg | ORAL_TABLET | Freq: Every day | ORAL | 1 refills | Status: DC

## 2022-01-17 ENCOUNTER — Other Ambulatory Visit: Payer: Self-pay | Admitting: Psychiatry

## 2022-01-25 NOTE — Progress Notes (Deleted)
BH MD/PA/NP OP Progress Note  01/25/2022 4:03 PM Camera ALZORA HA  MRN:  [id number]  Chief Complaint: No chief complaint on file.  HPI: *** Visit Diagnosis: No diagnosis found.  Past Psychiatric History: Please see initial evaluation for full details. I have reviewed the history. No updates at this time.     Past Medical History:  Past Medical History:  Diagnosis Date   Depression    GERD (gastroesophageal reflux disease)    History of kidney stones    Hyperlipidemia    Kidney stone    Pneumonia    Treadmill stress test negative for angina pectoris 12/18/2010    Past Surgical History:  Procedure Laterality Date   ABDOMINAL HYSTERECTOMY  2001   with 1 ovary removed; remaining over removed a yr later   CHOLECYSTECTOMY  2002   [place] LITHOTRIPSY Left 06/27/2020   Procedure: EXTRACORPOREAL SHOCK WAVE LITHOTRIPSY (ESWL);  Surgeon: Abbie Sons, MD;  Location: [facility] ORS;  Service: Urology;  Laterality: Left;   UPPER GI ENDOSCOPY  2019    Family Psychiatric History: Please see initial evaluation for full details. I have reviewed the history. No updates at this time.     Family History:  Family History  Problem Relation Age of Onset   COPD Mother    Heart attack Father 44   Hypertension Father    Heart disease Father        stent placement    Congenital heart disease Sister    Leukemia Brother    Anxiety disorder Maternal Uncle    Alcohol abuse Maternal Uncle    Kidney failure Maternal Grandfather    COPD Paternal Grandmother    Breast cancer Neg Hx     Social History:  Social History   Socioeconomic History   Marital status: Married    Spouse name: Mia Creek   Number of children: Not on file   Years of education: Not on file   Highest education level: Not on file  Occupational History   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 and liquor sometimes   Drug use: No   Sexual activity: Yes    Birth control/protection: Surgical    Comment: Hsyterectomy  Other Topics Concern   Not on file  Social History Narrative   Regular exercise- yes, occ walking   Diet- fruits and veggies, water    Social Determinants of Health   Financial Resource Strain: Not on file  Food Insecurity: Not on file  Transportation Needs: Not on file  Physical Activity: Not on file  Stress: Not on file  Social Connections: Not on file    Allergies:  Allergies  Allergen Reactions   Metoclopramide Hcl     REACTION: Nervousness   Morphine And Related Nausea Only   Venlafaxine Other (See Comments)    Hot flashes, clammy and nausea    Metabolic Disorder Labs: Lab Results  Component Value Date  HGBA1C 5.8 10/08/2020   No results found for: "PROLACTIN" Lab Results  Component Value Date   CHOL 255 (H) 06/24/2021   TRIG 224.0 (H) 06/24/2021   HDL 54.00 06/24/2021   CHOLHDL 5 06/24/2021   VLDL 44.8 (H) 06/24/2021   LDLCALC 118 (H) 10/10/2019   LDLCALC 171 (H) 01/26/2017   Lab Results  Component Value Date   TSH 0.96 11/18/2021   TSH 1.28 10/08/2020    Therapeutic Level Labs: No results found for: "LITHIUM" No results found for: "VALPROATE" No results found for: "CBMZ"  Current Medications: Current Outpatient Medications  Medication Sig Dispense Refill   aspirin 81 MG chewable tablet Chew 81 mg by mouth daily.     betamethasone dipropionate 0.05 % cream Apply topically 2 (two) times daily. 30 g 0   buPROPion (WELLBUTRIN) 75 MG tablet Take 1 tablet (75 mg total) by mouth daily. 30 tablet 1   calcium  carbonate (TUMS - DOSED IN MG ELEMENTAL CALCIUM) 500 MG chewable tablet Chew 6 tablets by mouth daily as needed for indigestion or heartburn.     diazepam (VALIUM) 5 MG tablet Take 1 tablet (5 mg total) by mouth daily as needed for anxiety. 30 tablet 0   estradiol (VIVELLE-DOT) 0.05 MG/24HR patch PLACE 1 PATCH TRANS-DERMALLY TWICE A WEEK. REMOVE OLD PATCH BEFORE APPLYING NEW PATCH. 24 patch 3   pantoprazole (PROTONIX) 40 MG tablet Take 1 tablet (40 mg total) by mouth daily. 90 tablet 3   triamcinolone cream (KENALOG) 0.1 % Apply 1 application. topically 2 (two) times daily. 15 g 1   No current facility-administered medications for this visit.     Musculoskeletal: Strength & Muscle Tone: within normal limits Gait & Station: normal Patient leans: N/A  Psychiatric Specialty Exam: Review of Systems  There were no vitals taken for this visit.There is no height or weight on file to calculate BMI.  General Appearance: {Appearance:22683}  Eye Contact:  {BHH EYE CONTACT:22684}  Speech:  Clear and Coherent  Volume:  Normal  Mood:  {BHH MOOD:22306}  Affect:  {Affect (PAA):22687}  Thought Process:  Coherent  Orientation:  Full (Time, Place, and Person)  Thought Content: Logical   Suicidal Thoughts:  {ST/HT (PAA):22692}  Homicidal Thoughts:  {ST/HT (PAA):22692}  Memory:  Immediate;   Good  Judgement:  {Judgement (PAA):22694}  Insight:  {Insight (PAA):22695}  Psychomotor Activity:  Normal  Concentration:  Concentration: Good and Attention Span: Good  Recall:  Good  Fund of Knowledge: Good  Language: Good  Akathisia:  No  Handed:  Right  AIMS (if indicated): not done  Assets:  Communication Skills Desire for Improvement  ADL's:  Intact  Cognition: WNL  Sleep:  {BHH GOOD/FAIR/POOR:22877}   Screenings: GAD-7    Flowsheet Row Office Visit from 11/18/2021 in [place] at [place] Visit from 08/19/2021 in [place] at [place] Visit from 06/24/2021  in [place] at [facility] Visit from 02/27/2021 in [place] at [facility] Visit from 10/08/2020 in [place] at [facility]  Total GAD-7 Score '20 18 18 21 13      '$ PHQ2-9    [place] Visit from 12/25/2021 in [place] Office Visit from 11/18/2021 in [place] at [facility] Visit from 08/19/2021 in [place] at [facility] Visit from 06/24/2021 in [place] at [facility] Visit from 02/27/2021 in [place] at [facility] Total Score '2 6 5 2 '$ 3  PHQ-9 Total Score '11 20 19 14 18      '$ Flowsheet Row Office Visit from 12/25/2021 in [place] ED from 11/15/2021 in [place] ED from 10/26/2021 in [place] CATEGORY No Risk No Risk No Risk        Assessment and Plan:  Rebecca Allen is a 56 y.o. year old female with a history of depression, anxiety, hysterectomy, hyperlipidemia, who presents for follow up appointment for below.    1. Moderate episode of recurrent major depressive disorder ([place]) 2. Panic disorder 3. Generalized anxiety disorder She continues to report depressive symptoms and anxiety, panic attacks since the last visit.  Psychosocial stressors includes loss of her daughter from following the overdose in August 2020, upcoming wedding of her daughter, and recent birth of her grandchild.  She could not tolerate sertraline, duloxetine due to adverse reaction. She is not interested in [place]. Will start bupropion to target depression. Will start from lowest dose given her history of adverse reaction to SSRI/SNRI.  Discussed potential risk of headache, palpitation, worsening in anxiety.  She has no known history of seizure. Will continue valium prn for anxiety.  She agrees that this medication to be used for only  short-term/emergency use given its risk.      # Insomnia She reports middle insomnia and snoring.  She reportedly has sleep study 7 years ago, which was not consistent with OSA.  Although she was advised to have another evaluation, she declined this at this time.    Plan Discontinue fluoxetine Start bupropion 75 mg daily  Continue diazepam 2.5 mg daily as needed for anxiety Next appointment:  6/20 at 3:30 for 30 mins, in person Will check with the patient if she checked TSH with her PCP at the next visit    Past trials of medication: sertraline (drowsiness), fluoxetine ("weird"), lexapro (vertigo), venlafaxine (jittery)   The patient demonstrates the following risk factors for suicide: Chronic risk factors for suicide include: psychiatric disorder of depression, anxiety  . Acute risk factors for suicide include: loss (financial, interpersonal, professional). Protective factors for this patient include: positive social support, coping skills, and hope for the future. Considering these factors, the overall suicide risk at this point appears to be low. Patient is appropriate for outpatient follow up.        Collaboration of Care: Collaboration of Care: {BH OP Collaboration of Care:21014065}  Patient/Guardian was advised Release of Information must be obtained prior to any record release in order to collaborate their care with an outside provider. Patient/Guardian was advised if they have not already done so to contact the registration department to sign all necessary forms in order for [place] to release information regarding their care.   Consent: Patient/Guardian gives verbal consent for treatment and assignment of benefits for services provided during this visit. Patient/Guardian expressed understanding and agreed to proceed.    Norman Clay, MD 01/25/2022, 4:03 PM

## 2022-01-27 ENCOUNTER — Telehealth (INDEPENDENT_AMBULATORY_CARE_PROVIDER_SITE_OTHER): Payer: 59 | Admitting: Family Medicine

## 2022-01-27 ENCOUNTER — Ambulatory Visit: Payer: 59 | Admitting: Psychiatry

## 2022-01-27 ENCOUNTER — Encounter: Payer: Self-pay | Admitting: Family Medicine

## 2022-01-27 VITALS — Temp 101.8°F | Ht 65.0 in | Wt 178.0 lb

## 2022-01-27 DIAGNOSIS — F17200 Nicotine dependence, unspecified, uncomplicated: Secondary | ICD-10-CM | POA: Diagnosis not present

## 2022-01-27 DIAGNOSIS — J209 Acute bronchitis, unspecified: Secondary | ICD-10-CM | POA: Insufficient documentation

## 2022-01-27 MED ORDER — BENZONATATE 200 MG PO CAPS
200.0000 mg | ORAL_CAPSULE | Freq: Three times a day (TID) | ORAL | 1 refills | Status: DC | PRN
Start: 2022-01-27 — End: 2022-04-24

## 2022-01-27 MED ORDER — PREDNISONE 10 MG PO TABS: ORAL_TABLET | ORAL | 0 refills | Status: DC

## 2022-01-27 MED ORDER — AMOXICILLIN-POT CLAVULANATE 875-125 MG PO TABS: 1.0000 | ORAL_TABLET | Freq: Two times a day (BID) | ORAL | 0 refills | Status: DC

## 2022-01-27 NOTE — Patient Instructions (Signed)
Drink fluids and rest  mucinex DM is good for cough and congestion  Try the tessalon for cough also  Nasal saline for congestion as needed  Tylenol for fever or pain or headache   Take prednisone as directed for bronchitis/chest tightness Let us know if you start to wheeze more  Rest your voice  Take the augmentin (antibiotic) as directed with food  Please alert [place] if symptoms worsen (if severe or short of breath please go to the ER)   If you feel ready to return to work earlier than Saturday let us know

## 2022-01-27 NOTE — Progress Notes (Signed)
Virtual Visit via Video Note  I connected with Rebecca Allen on 01/27/22 at 12:30 PM EDT by a video enabled telemedicine application and verified that I am speaking with the correct person using two identifiers.  Location: Patient: home Provider: office    I discussed the limitations of evaluation and management by telemedicine and the availability of in person appointments. The patient expressed understanding and agreed to proceed.  Parties involved in encounter  Patient: Rebecca Allen   Provider:  Loura Pardon MD   History of Present Illness: 56 yo pt of Dr Diona Browner  Pt presents for symptoms of bronchitis   Started on Sunday/monday Cough productive Phlegm- is green  Fever - 101.8   (chills and body aches)  Headache- cheeks and forehead hurt /some sinus fullness  Some green mucous from nose in am/ not severely stuffy  Voice - started very hoarse/ little better now  Is raspy  No sore throat  R ear is a little sore   Little wheezing /not bad  No problems getting air in or out    Smoking status :  1/3 ppd  Not now   No sick contacts that she knows of  Is exp to a well baby occasionally   Drinking tea and water   Mucinex- DM  Tylenol    2 covid tests are negative     Patient Active Problem List   Diagnosis Date Noted   Smoker 01/27/2022   Acute bronchitis 01/27/2022   Acute non-recurrent maxillary sinusitis 09/09/2021   Grieving 06/24/2021   Jittery 06/24/2021   Hot flashes due to surgical menopause 05/06/2021   Bilateral hand pain 04/22/2021   Cervical lymphadenopathy 02/27/2021   Blurred vision, bilateral 10/08/2020   Right wrist pain 10/08/2020   Hematuria 06/18/2020   Rash 05/13/2020   Hematoma 05/13/2020   Post-COVID chronic cough 11/15/2018   Allergic sinusitis 11/11/2018   Chronic insomnia 10/18/2018   Symptomatic mammary hypertrophy 09/30/2018   Current moderate episode of major depressive disorder ([place]) 09/06/2018   Gastritis  01/10/2018   Palpitations 05/25/2017   Chronic cervical radiculopathy 07/07/2016   Lymphadenopathy 02/07/2016   Post-traumatic stress 10/05/2014   Panic attacks 07/25/2013   Chronic low back pain with left-sided sciatica 05/30/2010   Generalized anxiety disorder 05/16/2009   Fatigue 05/01/2009   PERS HX TOBACCO USE PRESENTING HAZARDS HEALTH 05/07/2008   Mixed hyperlipidemia 03/28/2008   COMMON MIGRAINE 03/28/2008   GERD 03/28/2008   COSTOCHONDRITIS, RECURRENT 03/28/2008   Atypical chest pain 03/28/2008   Past Medical History:  Diagnosis Date   Depression    GERD (gastroesophageal reflux disease)    History of kidney stones    Hyperlipidemia    Kidney stone    Pneumonia    Treadmill stress test negative for angina pectoris 12/18/2010   Past Surgical History:  Procedure Laterality Date   ABDOMINAL HYSTERECTOMY  2001   with 1 ovary removed; remaining over removed a yr later   CHOLECYSTECTOMY  2002   COLONOSCOPY     EXTRACORPOREAL SHOCK WAVE LITHOTRIPSY Left 06/27/2020   Procedure: EXTRACORPOREAL SHOCK WAVE LITHOTRIPSY (ESWL);  Surgeon: Abbie Sons, MD;  Location: [facility];  Service: Urology;  Laterality: Left;   UPPER GI ENDOSCOPY  2019   Social History   Tobacco Use   Smoking status: Every Day    Packs/day: 0.25    Years: 30.00    Total pack years: 7.50    Types: Cigarettes   Smokeless tobacco: Never  Vaping Use  Vaping Use: Never used  Substance Use Topics   Alcohol use: Yes    Alcohol/week: 12.0 standard drinks of alcohol    Types: 12 Cans of beer per week    Comment: only on weekends. and liquor sometimes   Drug use: No   Family History  Problem Relation Age of Onset   COPD Mother    Heart attack Father 3   Hypertension Father    Heart disease Father        stent placement    Congenital heart disease Sister    Leukemia Brother    Anxiety disorder Maternal Uncle    Alcohol abuse Maternal Uncle    Kidney failure Maternal Grandfather    COPD  Paternal Grandmother    Breast cancer Neg Hx    Allergies  Allergen Reactions   Metoclopramide Hcl     REACTION: Nervousness   Morphine And Related Nausea Only   Venlafaxine Other (See Comments)    Hot flashes, clammy and nausea   Current Outpatient Medications on File Prior to Visit  Medication Sig Dispense Refill   aspirin 81 MG chewable tablet Chew 81 mg by mouth daily.     betamethasone dipropionate 0.05 % cream Apply topically 2 (two) times daily. 30 g 0   buPROPion (WELLBUTRIN) 75 MG tablet Take 1 tablet (75 mg total) by mouth daily. 30 tablet 1   calcium carbonate (TUMS - DOSED IN MG ELEMENTAL CALCIUM) 500 MG chewable tablet Chew 6 tablets by mouth daily as needed for indigestion or heartburn.     Dextromethorphan-guaiFENesin ([place] DM PO) Take by mouth.     diazepam (VALIUM) 5 MG tablet Take 1 tablet (5 mg total) by mouth daily as needed for anxiety. 30 tablet 0   estradiol (VIVELLE-DOT) 0.05 MG/24HR patch PLACE 1 PATCH TRANS-DERMALLY TWICE A WEEK. REMOVE OLD PATCH BEFORE APPLYING NEW PATCH. 24 patch 3   pantoprazole (PROTONIX) 40 MG tablet Take 1 tablet (40 mg total) by mouth daily. 90 tablet 3   triamcinolone cream (KENALOG) 0.1 % Apply 1 application. topically 2 (two) times daily. 15 g 1   No current facility-administered medications on file prior to visit.   Review of Systems  Constitutional:  Positive for chills, fever and malaise/fatigue.  HENT:  Positive for congestion. Negative for ear pain, sinus pain and sore throat.   Eyes:  Negative for blurred vision, discharge and redness.  Respiratory:  Positive for cough and sputum production. Negative for shortness of breath, wheezing and stridor.   Cardiovascular:  Negative for chest pain, palpitations and leg swelling.  Gastrointestinal:  Negative for abdominal pain, diarrhea, nausea and vomiting.  Musculoskeletal:  Negative for myalgias.  Skin:  Negative for rash.  Neurological:  Positive for headaches. Negative for  dizziness.    Observations/Objective:  Patient appears well, in no distress Weight is baseline  No facial swelling or asymmetry Hoarse voice  No obvious tremor or mobility impairment Moving neck and UEs normally Able to hear the call well  No wheeze or shortness of breath during interview  Cough sounds barky and productive  Talkative and mentally sharp with no cognitive changes No skin changes on face or neck , no rash or pallor Affect is normal   Assessment and Plan: Problem List Items Addressed This Visit       Respiratory   Acute bronchitis - Primary    With fever/prod barky cough and hoarseness in a smoker  Neg covid test times 2  Px prednisone taper 30  mg (disc side eff) Tessalon for cough  augmentin bid 7 d Fluids/rest Work note until Saturday  Disc symptom care/tylenol/mucinex dm and saline ER precautions disc Update if not starting to improve in a week or if worsening  Consider in person visit and cxr if not imp Strongly rec smoking cessation        Other   Smoker    With bronchitis/acute -no cig for 2 d Strongly enc to quit while she is sick  She is aware of long term risks        Follow Up Instructions:   Drink fluids and rest  mucinex DM is good for cough and congestion  Try the tessalon for cough also  Nasal saline for congestion as needed  Tylenol for fever or pain or headache   Take prednisone as directed for bronchitis/chest tightness Let us know if you start to wheeze more  Rest your voice  Take the augmentin (antibiotic) as directed with food  Please alert [place] if symptoms worsen (if severe or short of breath please go to the ER)   If you feel ready to return to work earlier than Saturday let us know  I discussed the assessment and treatment plan with the patient. The patient was provided an opportunity to ask questions and all were answered. The patient agreed with the plan and demonstrated an understanding of the instructions.   The  patient was advised to call back or seek an in-person evaluation if the symptoms worsen or if the condition fails to improve as anticipated.     Loura Pardon, MD

## 2022-01-27 NOTE — Assessment & Plan Note (Signed)
With bronchitis/acute -no cig for 2 d Strongly enc to quit while she is sick  She is aware of long term risks

## 2022-01-27 NOTE — Assessment & Plan Note (Signed)
With fever/prod barky cough and hoarseness in a smoker  Neg covid test times 2  Px prednisone taper 30 mg (disc side eff) Tessalon for cough  augmentin bid 7 d Fluids/rest Work note until Saturday  Disc symptom care/tylenol/mucinex dm and saline ER precautions disc Update if not starting to improve in a week or if worsening  Consider in person visit and cxr if not imp Strongly rec smoking cessation

## 2022-03-12 ENCOUNTER — Other Ambulatory Visit: Payer: Self-pay | Admitting: Family Medicine

## 2022-03-12 MED ORDER — BETAMETHASONE DIPROPIONATE 0.05 % EX CREA: TOPICAL_CREAM | Freq: Two times a day (BID) | CUTANEOUS | 0 refills | Status: DC

## 2022-03-12 NOTE — Addendum Note (Signed)
Addended by: Carter Kitten on: 03/12/2022 08:57 AM   Modules accepted: Orders

## 2022-03-12 NOTE — Telephone Encounter (Signed)
  Encourage patient to contact the pharmacy for refills or they can request refills through [facility]  Did the patient contact the pharmacy: No   LAST APPOINTMENT DATE: 01/27/22  NEXT APPOINTMENT DATE: N/A  MEDICATION: betamethasone dipropionate 0.05 % cream  Is the patient out of medication? Yes   PHARMACY: [id number] [place] [address]  Let patient know to contact pharmacy at the end of the day to make sure medication is ready.  Please notify patient to allow 48-72 hours to process

## 2022-03-12 NOTE — Telephone Encounter (Signed)
Last office visit 01/27/22 (virtual) with Dr. Glori Bickers for bronchitis.  Last refilled 11/18/21 for 30 g with no refills.  No future appointments.

## 2022-03-16 ENCOUNTER — Ambulatory Visit: Payer: 59 | Admitting: Dermatology

## 2022-04-24 ENCOUNTER — Ambulatory Visit (INDEPENDENT_AMBULATORY_CARE_PROVIDER_SITE_OTHER): Payer: 59 | Admitting: Family Medicine

## 2022-04-24 ENCOUNTER — Telehealth: Payer: Self-pay | Admitting: *Deleted

## 2022-04-24 ENCOUNTER — Encounter: Payer: Self-pay | Admitting: Family Medicine

## 2022-04-24 VITALS — BP 110/76 | HR 78 | Temp 97.9°F | Ht 64.75 in | Wt 174.5 lb

## 2022-04-24 DIAGNOSIS — E782 Mixed hyperlipidemia: Secondary | ICD-10-CM | POA: Diagnosis not present

## 2022-04-24 DIAGNOSIS — Z0000 Encounter for general adult medical examination without abnormal findings: Secondary | ICD-10-CM

## 2022-04-24 DIAGNOSIS — L301 Dyshidrosis [pompholyx]: Secondary | ICD-10-CM | POA: Insufficient documentation

## 2022-04-24 DIAGNOSIS — Z111 Encounter for screening for respiratory tuberculosis: Secondary | ICD-10-CM

## 2022-04-24 MED ORDER — VARENICLINE TARTRATE 1 MG PO TABS: 1.0000 mg | ORAL_TABLET | Freq: Two times a day (BID) | ORAL | 3 refills | Status: DC

## 2022-04-24 MED ORDER — BETAMETHASONE DIPROPIONATE 0.05 % EX CREA: TOPICAL_CREAM | Freq: Two times a day (BID) | CUTANEOUS | 0 refills | Status: DC

## 2022-04-24 MED ORDER — CHANTIX STARTING MONTH PAK 0.5 MG X 11 & 1 MG X 42 PO TBPK: ORAL_TABLET | ORAL | 0 refills | Status: DC

## 2022-04-24 MED ORDER — ALPRAZOLAM 0.5 MG PO TABS: 0.5000 mg | ORAL_TABLET | Freq: Every day | ORAL | 0 refills | Status: DC | PRN

## 2022-04-24 NOTE — Patient Instructions (Addendum)
Please stop at the lab to have labs drawn.  Quit smoking.  Start Chantix  course.

## 2022-04-24 NOTE — Telephone Encounter (Signed)
Received fax from [facility] requesting PA for Chantix.  PA completed on CoverMyMeds and sent for review.  Can take up to 72 hours for a decision.

## 2022-04-24 NOTE — Progress Notes (Signed)
Patient ID: Rebecca Allen, female    DOB: 07-01-1966, 56 y.o.   MRN: [id number]  This visit was conducted in person.  BP 110/76   Pulse 78   Temp 97.9 F (36.6 C) (Oral)   Ht 5' 4.75" (1.645 m)   Wt 174 lb 8 oz (79.2 kg)   SpO2 97%   BMI 29.26 kg/m    CC:  Chief Complaint  Patient presents with   Annual Exam    Subjective:   HPI: Rebecca Allen is a 56 y.o. female presenting on 04/24/2022 for Annual Exam   Elevated Cholesterol: Due for re-eval. Using medications without problems: Muscle aches:    Diet: intermittent fasting Exercise: wall pilates  Wt Readings from Last 3 Encounters:  04/24/22 174 lb 8 oz (79.2 kg)  01/27/22 178 lb (80.7 kg)  11/18/21 180 lb 3 oz (81.7 kg)      MDD/GAD, good and bad days.. not on any medications currently. Still panic attacks 2 times a week.. doing deep breathing.  She feels having alprazolam available. PHQ9:9 GAD7: 10   Dishydrotic eczema... betamethasone cream.. not helping.  Wearing gloves a lot.  Starting as Systems analyst.    Relevant past medical, surgical, family and social history reviewed and updated as indicated. Interim medical history since our last visit reviewed. Allergies and medications reviewed and updated. Outpatient Medications Prior to Visit  Medication Sig Dispense Refill   aspirin 81 MG chewable tablet Chew 81 mg by mouth daily.     betamethasone dipropionate 0.05 % cream Apply topically 2 (two) times daily. 30 g 0   calcium carbonate (TUMS - DOSED IN MG ELEMENTAL CALCIUM) 500 MG chewable tablet Chew 6 tablets by mouth daily as needed for indigestion or heartburn.     estradiol (VIVELLE-DOT) 0.05 MG/24HR patch PLACE 1 PATCH TRANS-DERMALLY TWICE A WEEK. REMOVE OLD PATCH BEFORE APPLYING NEW PATCH. 24 patch 3   pantoprazole (PROTONIX) 40 MG tablet Take 1 tablet (40 mg total) by mouth daily. 90 tablet 3   amoxicillin-clavulanate (AUGMENTIN) 875-125 MG tablet Take 1 tablet by mouth 2 (two)  times daily. 14 tablet 0   benzonatate (TESSALON) 200 MG capsule Take 1 capsule (200 mg total) by mouth 3 (three) times daily as needed for cough. Do not bite pill 30 capsule 1   buPROPion (WELLBUTRIN) 75 MG tablet Take 1 tablet (75 mg total) by mouth daily. 30 tablet 1   Dextromethorphan-guaiFENesin (MUCINEX DM PO) Take by mouth.     diazepam (VALIUM) 5 MG tablet Take 1 tablet (5 mg total) by mouth daily as needed for anxiety. 30 tablet 0   predniSONE (DELTASONE) 10 MG tablet Take 3 pills once daily by mouth for 3 days, then 2 pills once daily for 3 days, then 1 pill once daily for 3 days and then stop 18 tablet 0   triamcinolone cream (KENALOG) 0.1 % Apply 1 application. topically 2 (two) times daily. 15 g 1   No facility-administered medications prior to visit.     Per HPI unless specifically indicated in ROS section below Review of Systems  Constitutional:  Negative for fatigue and fever.  HENT:  Negative for congestion.   Eyes:  Negative for pain.  Respiratory:  Negative for cough and shortness of breath.   Cardiovascular:  Negative for chest pain, palpitations and leg swelling.  Gastrointestinal:  Negative for abdominal pain.  Genitourinary:  Negative for dysuria and vaginal bleeding.  Musculoskeletal:  Negative for back pain.  Neurological:  Negative for syncope, light-headedness and headaches.  Psychiatric/Behavioral:  Negative for dysphoric mood.    Objective:  BP 110/76   Pulse 78   Temp 97.9 F (36.6 C) (Oral)   Ht 5' 4.75" (1.645 m)   Wt 174 lb 8 oz (79.2 kg)   SpO2 97%   BMI 29.26 kg/m   Wt Readings from Last 3 Encounters:  04/24/22 174 lb 8 oz (79.2 kg)  01/27/22 178 lb (80.7 kg)  11/18/21 180 lb 3 oz (81.7 kg)      Physical Exam Vitals and nursing note reviewed.  Constitutional:      General: She is not in acute distress.    Appearance: Normal appearance. She is well-developed. She is not ill-appearing or toxic-appearing.  HENT:     Head: Normocephalic.      Right Ear: Hearing, tympanic membrane, ear canal and external ear normal.     Left Ear: Hearing, tympanic membrane, ear canal and external ear normal.     Nose: Nose normal.  Eyes:     General: Lids are normal. Lids are everted, no foreign bodies appreciated.     Conjunctiva/sclera: Conjunctivae normal.     Pupils: Pupils are equal, round, and reactive to light.  Neck:     Thyroid: No thyroid mass or thyromegaly.     Vascular: No carotid bruit.     Trachea: Trachea normal.  Cardiovascular:     Rate and Rhythm: Normal rate and regular rhythm.     Heart sounds: Normal heart sounds, S1 normal and S2 normal. No murmur heard.    No gallop.  Pulmonary:     Effort: Pulmonary effort is normal. No respiratory distress.     Breath sounds: Normal breath sounds. No wheezing, rhonchi or rales.  Abdominal:     General: Bowel sounds are normal. There is no distension or abdominal bruit.     Palpations: Abdomen is soft. There is no fluid wave or mass.     Tenderness: There is no abdominal tenderness. There is no guarding or rebound.     Hernia: No hernia is present.  Musculoskeletal:     Cervical back: Normal range of motion and neck supple.  Lymphadenopathy:     Cervical: No cervical adenopathy.  Skin:    General: Skin is warm and dry.     Findings: No rash.  Neurological:     Mental Status: She is alert.     Cranial Nerves: No cranial nerve deficit.     Sensory: No sensory deficit.  Psychiatric:        Mood and Affect: Mood is not anxious or depressed.        Speech: Speech normal.        Behavior: Behavior normal. Behavior is cooperative.        Judgment: Judgment normal.        Results for orders placed or performed in visit on 11/18/21  TSH  Result Value Ref Range   TSH 0.96 0.35 - 5.50 uIU/mL     COVID 19 screen:  No recent travel or known exposure to [place] The patient denies respiratory symptoms of COVID 19 at this time. The importance of social distancing was discussed  today.   Assessment and Plan   The patient's preventative maintenance and recommended screening tests for an annual wellness exam were reviewed in full today. Brought up to date unless services declined.  Counselled on the importance of diet, exercise, and its role in overall health and mortality. The  patient's FH and SH was reviewed, including their home life, tobacco status, and drug and alcohol status.   Vaccines: Refused shingrix, flu, uptodate with tdap Pap/DVE:  04/2021  Mammo: 06/2021 Colon: per pt not due yet.. Smoking Status: current, contemplative... Chantix ETOH/ drug HTX:HFSF/SELT  Hep C:  Due  HIV screen:   refused  Problem List Items Addressed This Visit     Dyshidrotic eczema   Mixed hyperlipidemia   Relevant Orders   Comprehensive metabolic panel   Lipid Panel   Other Visit Diagnoses     Routine general medical examination at a health care facility    -  Primary   Tuberculosis screening       Relevant Orders   QuantiFERON-TB Gold Plus        Eliezer Lofts, MD

## 2022-04-25 LAB — LIPID PANEL
Cholesterol: 266 mg/dL — ABNORMAL HIGH (ref <200)
HDL: 56 mg/dL (ref >=50)
LDL Cholesterol (Calc): 179 mg/dL (calc) — ABNORMAL HIGH
Non-HDL Cholesterol (Calc): 210 mg/dL (calc) — ABNORMAL HIGH (ref <130)
Total CHOL/HDL Ratio: 4.8 (calc) (ref <5.0)
Triglycerides: 166 mg/dL — ABNORMAL HIGH (ref <150)

## 2022-04-25 LAB — COMPREHENSIVE METABOLIC PANEL
AG Ratio: 2.1 (calc) (ref 1.0–2.5)
ALT: 88 U/L — ABNORMAL HIGH (ref 6–29)
AST: 52 U/L — ABNORMAL HIGH (ref 10–35)
Albumin: 4.9 g/dL (ref 3.6–5.1)
Alkaline phosphatase (APISO): 52 U/L (ref 37–153)
BUN: 16 mg/dL (ref 7–25)
CO2: 22 mmol/L (ref 20–32)
Calcium: 9.5 mg/dL (ref 8.6–10.4)
Chloride: 104 mmol/L (ref 98–110)
Creat: 0.75 mg/dL (ref 0.50–1.03)
Globulin: 2.3 g/dL (calc) (ref 1.9–3.7)
Glucose, Bld: 82 mg/dL (ref 65–99)
Potassium: 3.9 mmol/L (ref 3.5–5.3)
Sodium: 138 mmol/L (ref 135–146)
Total Bilirubin: 0.5 mg/dL (ref 0.2–1.2)
Total Protein: 7.2 g/dL (ref 6.1–8.1)

## 2022-04-28 LAB — QUANTIFERON-TB GOLD PLUS
Mitogen-NIL: >10 IU/mL
NIL: 0.07 IU/mL
QuantiFERON-TB Gold Plus: NEGATIVE
TB1-NIL: 0 IU/mL
TB2-NIL: <0 IU/mL

## 2022-05-27 ENCOUNTER — Telehealth: Payer: Self-pay | Admitting: Family Medicine

## 2022-05-27 ENCOUNTER — Other Ambulatory Visit: Payer: Self-pay | Admitting: Family Medicine

## 2022-05-27 NOTE — Telephone Encounter (Signed)
Patient tried to schedule mammogram per dr Laverda Page was told that Dr Diona Browner needs to put in an order for a diagnostic and ultrasound,before she could have the mammogram done.

## 2022-05-28 NOTE — Telephone Encounter (Signed)
Agree with recommendations for screening mammogram.

## 2022-05-28 NOTE — Telephone Encounter (Signed)
Spoke with Tish.  She states she called to schedule her routine mammogram but because she had an abnormal mammogram a couple of years ago, they told her to discuss with Dr. Diona Browner if she needed to do another diagnostic mammogram and ultrasound.  Per her mammogram from 06/19/21 the recommendations were to do a screening mammogram in one year.  Currently patient is having no issues or concerns with her breast.  I advised Tish to call them back and just schedule a screening mammogram.  Patient states understanding.   FYI to Dr. Diona Browner.

## 2022-05-28 NOTE — Telephone Encounter (Signed)
Left message for Tish to return my call in regards to Mammogram orders.

## 2022-05-31 ENCOUNTER — Encounter: Payer: Self-pay | Admitting: Emergency Medicine

## 2022-05-31 ENCOUNTER — Other Ambulatory Visit: Payer: Self-pay

## 2022-05-31 ENCOUNTER — Emergency Department
Admission: EM | Admit: 2022-05-31 | Discharge: 2022-05-31 | Disposition: A | Discharge status: Home / Self Care | Payer: 59 | Attending: Emergency Medicine | Admitting: Emergency Medicine

## 2022-05-31 ENCOUNTER — Emergency Department: Payer: 59

## 2022-05-31 DIAGNOSIS — N200 Calculus of kidney: Secondary | ICD-10-CM | POA: Diagnosis not present

## 2022-05-31 DIAGNOSIS — R109 Unspecified abdominal pain: Secondary | ICD-10-CM | POA: Diagnosis present

## 2022-05-31 DIAGNOSIS — N23 Unspecified renal colic: Secondary | ICD-10-CM

## 2022-05-31 LAB — BASIC METABOLIC PANEL
Anion gap: 7 (ref 5–15)
BUN: 13 mg/dL (ref 6–20)
CO2: 23 mmol/L (ref 22–32)
Calcium: 10.3 mg/dL (ref 8.9–10.3)
Chloride: 109 mmol/L (ref 98–111)
Creatinine, Ser: 0.85 mg/dL (ref 0.44–1.00)
GFR, Estimated: >60 mL/min (ref >60)
Glucose, Bld: 97 mg/dL (ref 70–99)
Potassium: 4 mmol/L (ref 3.5–5.1)
Sodium: 139 mmol/L (ref 135–145)

## 2022-05-31 LAB — URINALYSIS, ROUTINE W REFLEX MICROSCOPIC
Bilirubin Urine: NEGATIVE
Glucose, UA: NEGATIVE mg/dL
Hgb urine dipstick: NEGATIVE
Ketones, ur: NEGATIVE mg/dL
Leukocytes,Ua: NEGATIVE
Nitrite: NEGATIVE
Protein, ur: NEGATIVE mg/dL
Specific Gravity, Urine: 1.017 (ref 1.005–1.030)
pH: 5 (ref 5.0–8.0)

## 2022-05-31 LAB — CBC
HCT: 45 % (ref 36.0–46.0)
Hemoglobin: 14.5 g/dL (ref 12.0–15.0)
MCH: 30.1 pg (ref 26.0–34.0)
MCHC: 32.2 g/dL (ref 30.0–36.0)
MCV: 93.6 fL (ref 80.0–100.0)
Platelets: 297 10*3/uL (ref 150–400)
RBC: 4.81 MIL/uL (ref 3.87–5.11)
RDW: 13 % (ref 11.5–15.5)
WBC: 6.6 10*3/uL (ref 4.0–10.5)
nRBC: 0 % (ref 0.0–0.2)

## 2022-05-31 MED ORDER — ONDANSETRON 4 MG PO TBDP
4.0000 mg | ORAL_TABLET | Freq: Once | ORAL | Status: AC | PRN
  Administered 2022-05-31: 4 mg via ORAL
  Filled 2022-05-31: qty 1

## 2022-05-31 MED ORDER — KETOROLAC TROMETHAMINE 60 MG/2ML IM SOLN
30.0000 mg | Freq: Once | INTRAMUSCULAR | Status: AC
  Administered 2022-05-31: 30 mg via INTRAMUSCULAR
  Filled 2022-05-31: qty 2

## 2022-05-31 MED ORDER — NAPROXEN 500 MG PO TABS: 500.0000 mg | ORAL_TABLET | Freq: Two times a day (BID) | ORAL | 0 refills | Status: DC

## 2022-05-31 MED ORDER — OXYCODONE-ACETAMINOPHEN 5-325 MG PO TABS
1.0000 | ORAL_TABLET | ORAL | Status: DC | PRN
  Administered 2022-05-31: 1 via ORAL
  Filled 2022-05-31: qty 1

## 2022-05-31 MED ORDER — OXYCODONE-ACETAMINOPHEN 10-325 MG PO TABS: 1.0000 | ORAL_TABLET | Freq: Four times a day (QID) | ORAL | 0 refills | Status: AC | PRN

## 2022-05-31 NOTE — ED Provider Notes (Signed)
[facility] Provider Note    Event Date/Time   First MD Initiated Contact with Patient 05/31/22 [phone]     (approximate)   History   Flank Pain   HPI  Rebecca Allen is a 56 y.o. female with history of kidney stone and as listed in the EMR presents emergency department for treatment and evaluation of left flank pain that radiates into the left lower quadrant with associated nausea.  Symptoms started yesterday. Feels like kidney stone.      Physical Exam   Triage Vital Signs: ED Triage Vitals  Enc Vitals Group     BP 05/31/22 0654 116/62     Pulse Rate 05/31/22 0654 74     Resp 05/31/22 0654 18     Temp 05/31/22 0654 98.2 F (36.8 C)     Temp Source 05/31/22 0654 Oral     SpO2 05/31/22 0654 96 %     Weight 05/31/22 0642 170 lb (77.1 kg)     Height 05/31/22 0642 '5\' 5"'$  (1.651 m)     Head Circumference --      Peak Flow --      Pain Score 05/31/22 0643 10     Pain Loc --      Pain Edu? --      Excl. in [place]? --     Most recent vital signs: Vitals:   05/31/22 0654  BP: 116/62  Pulse: 74  Resp: 18  Temp: 98.2 F (36.8 C)  SpO2: 96%     General: Awake, no distress.  CV:  Good peripheral perfusion.  Resp:  Normal effort.  Abd:  No distention. No focal abdominal tenderness. Other:  No CVA tenderness.    ED Results / Procedures / Treatments   Labs (all labs ordered are listed, but only abnormal results are displayed) Labs Reviewed  URINALYSIS, ROUTINE W REFLEX MICROSCOPIC - Abnormal; Notable for the following components:      Result Value   Color, Urine YELLOW (*)    APPearance CLEAR (*)    All other components within normal limits  BASIC METABOLIC PANEL  CBC     EKG  Not indicated.   RADIOLOGY  CT report shows no acute finding including hydronephrosis or ureteral calculus.   PROCEDURES:  Critical Care performed: No  Procedures   MEDICATIONS ORDERED IN ED: Medications  oxyCODONE-acetaminophen  (PERCOCET/ROXICET) 5-325 MG per tablet 1 tablet (1 tablet Oral Given 05/31/22 0648)  ondansetron (ZOFRAN-ODT) disintegrating tablet 4 mg (4 mg Oral Given 05/31/22 0648)  ketorolac (TORADOL) injection 30 mg (30 mg Intramuscular Given 05/31/22 0748)     IMPRESSION / MDM / ASSESSMENT AND PLAN / ED COURSE  I reviewed the triage vital signs and the nursing notes.                              Differential diagnosis includes, but is not limited to, kidney stone, musculoskeletal pain, diverticulitis  [name] presentation is most consistent with acute illness / injury with system symptoms.  56 year old female presenting to the emergency department for treatment and evaluation of acute onset left flank pain that started yesterday.  See HPI for further details.  While awaiting ER room assignment, labs and urinalysis were collected.  All are reassuring.  CT rule out renal stone was obtained as well and is negative.  Results discussed with the patient.  She continues to have some renal colic and may have  passed a stone at some point before CT.  Plan will be to give her IM Toradol.  Some relief after Toradol but she continues to experience pain.  We discussed further imaging with CT with contrast to get better imaging of the bowel and rule out any appearance of abscess or solid mass in the abdomen that may not be well visualized with a noncontrast scan. Complete hysterectomy, therefore pelvic ultrasound would be of no benefit.  We also discussed discharge with medications for pain control and strict ER return precautions. She chose the latter and is aware that she may return at any point. She feels reassured that there is no kidney stone and would like to go home and see if pain resolves with time/medication.     FINAL CLINICAL IMPRESSION(S) / ED DIAGNOSES   Final diagnoses:  Renal colic on left side     Rx / DC Orders   ED Discharge Orders          Ordered    oxyCODONE-acetaminophen  (PERCOCET) 10-325 MG tablet  Every 6 hours PRN        05/31/22 0821    naproxen (NAPROSYN) 500 MG tablet  2 times daily with meals        05/31/22 0518             Note:  This document was prepared using Dragon voice recognition software and may include unintentional dictation errors.   Victorino Dike, FNP 05/31/22 1044    Duffy Bruce, MD 05/31/22 2008

## 2022-05-31 NOTE — ED Notes (Signed)
Pt c/o flank pain. Pt is ambulatory.

## 2022-05-31 NOTE — Discharge Instructions (Signed)
Return to the ER if symptoms change, worsen, or are not improving.

## 2022-05-31 NOTE — ED Triage Notes (Signed)
Pt arrived via POV with reports of L flank pain that started yesterday, pt states she has hx of kidney stones with lithotripsy intervention.  Pt states she has had nausea and pain with urination since yesterday. Pt in obvious discomfort on arrival.

## 2022-06-03 ENCOUNTER — Telehealth: Payer: Self-pay | Admitting: Family Medicine

## 2022-06-03 NOTE — Telephone Encounter (Signed)
I spoke with pt;pt seen [facility] ED on 05/31/22; pt said she is supposed to return to work today but the bladder spasms are so bad on and off and pt cannot work pain level 9. Pt said when has the bladder spasm it takes the pts breath away. Pt said she is passing sandy grit. Pt is not running fever. Pt said ED gave her percocet and pt does not want to take pain med. No available appts today and pt scheduled appt with Dr Diona Browner on 06/03/22. Pt said that she does not want to return to ED because she does not want to wait all day at ED. Pt saw [facility] 04/2021; pt will call urology to see if they can see pt today and if so pt will cb and cancel appt with Dr Diona Browner. UC & ED precautions given and pt voiced understanding. Sending note to Dr Diona Browner and Butch Penny CMA.

## 2022-06-03 NOTE — Telephone Encounter (Signed)
Noted. Correction appointment made with me on 10/26

## 2022-06-03 NOTE — Telephone Encounter (Signed)
Pt called in requesting a call back regarding her kidney stone  have questions and concern .Please advise [phone]

## 2022-06-04 ENCOUNTER — Ambulatory Visit: Payer: 59 | Admitting: Family Medicine

## 2022-06-04 VITALS — BP 100/62 | HR 76 | Temp 97.5°F | Ht 64.75 in | Wt 166.5 lb

## 2022-06-04 DIAGNOSIS — Z79890 Hormone replacement therapy (postmenopausal): Secondary | ICD-10-CM | POA: Diagnosis not present

## 2022-06-04 DIAGNOSIS — M545 Low back pain, unspecified: Secondary | ICD-10-CM | POA: Diagnosis not present

## 2022-06-04 DIAGNOSIS — R109 Unspecified abdominal pain: Secondary | ICD-10-CM | POA: Diagnosis not present

## 2022-06-04 LAB — POC URINALSYSI DIPSTICK (AUTOMATED)
Bilirubin, UA: NEGATIVE
Blood, UA: NEGATIVE
Glucose, UA: NEGATIVE
Ketones, UA: NEGATIVE
Leukocytes, UA: NEGATIVE
Nitrite, UA: NEGATIVE
Protein, UA: NEGATIVE
Spec Grav, UA: 1.025 (ref 1.010–1.025)
Urobilinogen, UA: 0.2 E.U./dL
pH, UA: 5.5 (ref 5.0–8.0)

## 2022-06-04 MED ORDER — ESTRADIOL 0.05 MG/24HR TD PTTW: MEDICATED_PATCH | TRANSDERMAL | 3 refills | Status: DC

## 2022-06-04 NOTE — Assessment & Plan Note (Signed)
Acute, most consistent with recently passed stone/grit.  She continues to have bladder spasm and pain but urinalysis shows no evidence of urinary infection.  I encouraged her to push fluids and use Tylenol or ibuprofen for pain.  She will call if her pain is not well controlled.  Of note no clear other cause of abdominal and flank pain given no bowel movement changes, blood in the stool.  She is status post total hysterectomy on hormone replacement.  Possible pain secondary to adhesions.

## 2022-06-04 NOTE — Assessment & Plan Note (Signed)
Usually filled by gynecologist but she has been unable to contact them.  Refill sent to pharmacy.  Encouraged patient to follow-up with gynecologist as able.

## 2022-06-04 NOTE — Progress Notes (Signed)
Patient ID: Rebecca Allen, female    DOB: 03-19-1966, 56 y.o.   MRN: [id number]  This visit was conducted in person.  BP 100/62   Pulse 76   Temp (!) 97.5 F (36.4 C) (Temporal)   Ht 5' 4.75" (1.645 m)   Wt 166 lb 8 oz (75.5 kg)   SpO2 97%   BMI 27.92 kg/m    CC:  Chief Complaint  Patient presents with   Hospitalization Follow-up    ED f/u. Pt is having bladder spasms now and still lower back pain.     Subjective:   HPI: Rebecca Allen is a 56 y.o. female history of kidney stone presenting on 06/04/2022 for Hospitalization Follow-up (ED f/u. Pt is having bladder spasms now and still lower back pain. )   Seen in emergency room on May 31, 2022 with left flank pain radiating to left lower quadrant and associated nausea. Flet very similar to past kidney stone. Renal CT was negative Felt that she may have passed a stone at some point before the CT.  Given IM Toradol. Her pain was improved somewhat but not entirely.  CT with contrast to evaluate bowel further was discussed but she decided to move forward with discharge with medicines for pain control and strict ER precautions. In the ER UA was clear, normal BMP and CBC  Today she reports she has noted some sandy debris in urine 4 days ago.  Continuing to have pain on left flank into left abdomen.  No D, no C.     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    Oxycodone makes her feel bad... causes constipation.  Hx of lithotripsy.  Relevant past medical, surgical, family and social history reviewed and updated as indicated. Interim medical history since our last visit reviewed. Allergies and medications reviewed and updated. Outpatient Medications Prior to Visit  Medication Sig Dispense Refill   ALPRAZolam (XANAX) 0.5 MG tablet Take 1 tablet (0.5 mg total) by mouth daily as needed for anxiety. 20 tablet 0   aspirin 81 MG chewable tablet Chew 81 mg by mouth daily.     betamethasone dipropionate 0.05 %  cream Apply topically 2 (two) times daily. 30 g 0   calcium carbonate (TUMS - DOSED IN MG ELEMENTAL CALCIUM) 500 MG chewable tablet Chew 6 tablets by mouth daily as needed for indigestion or heartburn.     estradiol (VIVELLE-DOT) 0.05 MG/24HR patch PLACE 1 PATCH TRANS-DERMALLY TWICE A WEEK. REMOVE OLD PATCH BEFORE APPLYING NEW PATCH. 24 patch 3   naproxen (NAPROSYN) 500 MG tablet Take 1 tablet (500 mg total) by mouth 2 (two) times daily with a meal. 30 tablet 0   oxyCODONE-acetaminophen (PERCOCET) 10-325 MG tablet Take 1 tablet by mouth every 6 (six) hours as needed for up to 5 days for pain. 12 tablet 0   pantoprazole (PROTONIX) 40 MG tablet Take 1 tablet (40 mg total) by mouth daily. 90 tablet 3   varenicline (CHANTIX CONTINUING MONTH PAK) 1 MG tablet Take 1 tablet (1 mg total) by mouth 2 (two) times daily. 60 tablet 3   Varenicline Tartrate, Starter, (CHANTIX STARTING MONTH PAK) 0.5 MG X 11 & 1 MG X 42 TBPK Take one 0.5 mg tab daily for 3 days,then inc. to one 0.5 mg tab BID x 4 days, then inc. to one 1 mg tab BID. 85 each 0   No facility-administered medications prior to visit.     Per HPI unless specifically indicated in ROS  section below Review of Systems  Constitutional:  Negative for fatigue and fever.  HENT:  Negative for congestion.   Eyes:  Negative for pain.  Respiratory:  Negative for cough and shortness of breath.   Cardiovascular:  Negative for chest pain, palpitations and leg swelling.  Gastrointestinal:  Positive for abdominal pain.  Genitourinary:  Negative for dysuria and vaginal bleeding.  Musculoskeletal:  Negative for back pain.  Neurological:  Negative for syncope, light-headedness and headaches.  Psychiatric/Behavioral:  Negative for dysphoric mood.    Objective:  BP 100/62   Pulse 76   Temp (!) 97.5 F (36.4 C) (Temporal)   Ht 5' 4.75" (1.645 m)   Wt 166 lb 8 oz (75.5 kg)   SpO2 97%   BMI 27.92 kg/m   Wt Readings from Last 3 Encounters:  06/04/22 166 lb 8  oz (75.5 kg)  05/31/22 170 lb (77.1 kg)  04/24/22 174 lb 8 oz (79.2 kg)      Physical Exam Constitutional:      General: She is not in acute distress.    Appearance: Normal appearance. She is well-developed. She is not ill-appearing or toxic-appearing.  HENT:     Head: Normocephalic.     Right Ear: Hearing, tympanic membrane, ear canal and external ear normal. Tympanic membrane is not erythematous, retracted or bulging.     Left Ear: Hearing, tympanic membrane, ear canal and external ear normal. Tympanic membrane is not erythematous, retracted or bulging.     Nose: No mucosal edema or rhinorrhea.     Right Sinus: No maxillary sinus tenderness or frontal sinus tenderness.     Left Sinus: No maxillary sinus tenderness or frontal sinus tenderness.     Mouth/Throat:     Pharynx: Uvula midline.  Eyes:     General: Lids are normal. Lids are everted, no foreign bodies appreciated.     Conjunctiva/sclera: Conjunctivae normal.     Pupils: Pupils are equal, round, and reactive to light.  Neck:     Thyroid: No thyroid mass or thyromegaly.     Vascular: No carotid bruit.     Trachea: Trachea normal.  Cardiovascular:     Rate and Rhythm: Normal rate and regular rhythm.     Pulses: Normal pulses.     Heart sounds: Normal heart sounds, S1 normal and S2 normal. No murmur heard.    No friction rub. No gallop.  Pulmonary:     Effort: Pulmonary effort is normal. No tachypnea or respiratory distress.     Breath sounds: Normal breath sounds. No decreased breath sounds, wheezing, rhonchi or rales.  Abdominal:     General: Bowel sounds are normal.     Palpations: Abdomen is soft.     Tenderness: There is abdominal tenderness in the left lower quadrant. There is left CVA tenderness.  Musculoskeletal:     Cervical back: Normal range of motion and neck supple.  Skin:    General: Skin is warm and dry.     Findings: No rash.  Neurological:     Mental Status: She is alert.  Psychiatric:        Mood  and Affect: Mood is not anxious or depressed.        Speech: Speech normal.        Behavior: Behavior normal. Behavior is cooperative.        Thought Content: Thought content normal.        Judgment: Judgment normal.       Results for orders  placed or performed in visit on 06/04/22  POCT Urinalysis Dipstick (Automated)  Result Value Ref Range   Color, UA yellow    Clarity, UA cloudy    Glucose, UA Negative Negative   Bilirubin, UA negative    Ketones, UA negative    Spec Grav, UA 1.025 1.010 - 1.025   Blood, UA negative    pH, UA 5.5 5.0 - 8.0   Protein, UA Negative Negative   Urobilinogen, UA 0.2 0.2 or 1.0 E.U./dL   Nitrite, UA negative    Leukocytes, UA Negative Negative     COVID 19 screen:  No recent travel or known exposure to COVID19 The patient denies respiratory symptoms of COVID 19 at this time. The importance of social distancing was discussed today.   Assessment and Plan    Problem List Items Addressed This Visit     Acute flank pain - Primary    Acute, most consistent with recently passed stone/grit.  She continues to have bladder spasm and pain but urinalysis shows no evidence of urinary infection.  I encouraged her to push fluids and use Tylenol or ibuprofen for pain.  She will call if her pain is not well controlled.  Of note no clear other cause of abdominal and flank pain given no bowel movement changes, blood in the stool.  She is status post total hysterectomy on hormone replacement.  Possible pain secondary to adhesions.      Hormone replacement therapy (HRT)    Usually filled by gynecologist but she has been unable to contact them.  Refill sent to pharmacy.  Encouraged patient to follow-up with gynecologist as able.      Relevant Medications   estradiol (VIVELLE-DOT) 0.05 MG/24HR patch   Meds ordered this encounter  Medications   estradiol (VIVELLE-DOT) 0.05 MG/24HR patch    Sig: PLACE 1 PATCH TRANS-DERMALLY TWICE A WEEK. REMOVE OLD PATCH  BEFORE APPLYING NEW PATCH.    Dispense:  24 patch    Refill:  3     Eliezer Lofts, MD

## 2022-06-04 NOTE — Patient Instructions (Addendum)
Increase fluids.  Start  tylenol ES  2 tablets twice daily.  Keep appt with urologist.  Call if pain not controlled.

## 2022-06-08 NOTE — Progress Notes (Unsigned)
06/10/22 9:47 AM   Rebecca Allen 09-06-1965 [id number]  Referring provider:  Jinny Sanders, MD [address] [place],  [place] [address]  Urological history  1. Nephrolithiasis  -Stone composition of 70% calcium oxalate dihydrate, 25% calcium monohydrate and 5% hydroxyapatite - S/p ESWL with Dr. Bernardo Heater on 06/27/2020 for 6 mm left UPJ stone with subsequent development of a left subcapsular hematoma - RUS dated 08/29/2020 notable for stable appearance of the left lower pole subcapsular hematoma.  2. Hematoma of left kidney -resolved on CT renal stone study 04/2021  Nephrolithiasis   HPI: Rebecca Allen is a 56 y.o.female who pesents today for follow up from the ED.   She presented to the ED on May 31, 2022 with the complaints of left flank pain that radiated into the left lower quadrant with nausea.  CT renal stone study showed no stones.  UA was yellow clear, specific gravity 1.017, pH of 5.0.  Serum creatinine was 0.85.  WBC count of 6.6.    Today, she continues to experience the left-sided flank pain that is rating down into her left inguinal area.  She is actually writhing around in discomfort during her office visit today.  She states she is passing grit as well and having some nausea.  Patient denies any modifying or aggravating factors.  Patient denies any gross hematuria, dysuria or suprapubic/flank pain.  Patient denies any fevers, chills or vomiting.    UA from today revealed a yellow clear urine, 1+ ketone, trace blood, pH 5.0, WBC 0-5, 3-10 RBCs, 0-10 epithelial cells, mucus threads present and many bacteria.  PMH: Past Medical History:  Diagnosis Date   Depression    GERD (gastroesophageal reflux disease)    History of kidney stones    Hyperlipidemia    Kidney stone    Pneumonia    Treadmill stress test negative for angina pectoris 12/18/2010    Surgical History: Past Surgical History:  Procedure Laterality Date   ABDOMINAL HYSTERECTOMY   2001   with 1 ovary removed; remaining over removed a yr later   CHOLECYSTECTOMY  2002   [place] LITHOTRIPSY Left 06/27/2020   Procedure: EXTRACORPOREAL SHOCK WAVE LITHOTRIPSY (ESWL);  Surgeon: Abbie Sons, MD;  Location: [facility] ORS;  Service: Urology;  Laterality: Left;   UPPER GI ENDOSCOPY  2019    Home Medications:  Allergies as of 06/09/2022       Reactions   Metoclopramide Hcl    REACTION: Nervousness   Morphine And Related Nausea Only   Venlafaxine Other (See Comments)   Hot flashes, clammy and nausea        Medication List        Accurate as of June 09, 2022 11:59 PM. If you have any questions, ask your nurse or doctor.          ALPRAZolam 0.5 MG tablet Commonly known as: XANAX Take 1 tablet (0.5 mg total) by mouth daily as needed for anxiety.   aspirin 81 MG chewable tablet Chew 81 mg by mouth daily.   betamethasone dipropionate 0.05 % cream Apply topically 2 (two) times daily.   calcium carbonate 500 MG chewable tablet Commonly known as: TUMS - dosed in mg elemental calcium Chew 6 tablets by mouth daily as needed for indigestion or heartburn.   Chantix Starting Month Pak 0.5 MG X 11 & 1 MG X 42 Tbpk Generic drug: Varenicline Tartrate (Starter) Take one 0.5 mg tab daily for  3 days,then inc. to one 0.5 mg tab BID x 4 days, then inc. to one 1 mg tab BID.   varenicline 1 MG tablet Commonly known as: Chantix Continuing Month Pak Take 1 tablet (1 mg total) by mouth 2 (two) times daily.   estradiol 0.05 MG/24HR patch Commonly known as: VIVELLE-DOT PLACE 1 PATCH TRANS-DERMALLY TWICE A WEEK. REMOVE OLD PATCH BEFORE APPLYING NEW PATCH.   HYDROcodone-acetaminophen 5-325 MG tablet Commonly known as: NORCO/VICODIN Take 1 tablet by mouth every 6 (six) hours as needed for moderate pain. Started by: Zara Council, PA-C   naproxen 500 MG tablet Commonly known as: Naprosyn Take 1 tablet (500 mg total) by mouth 2 (two)  times daily with a meal.   ondansetron 4 MG tablet Commonly known as: Zofran Take 1 tablet (4 mg total) by mouth every 8 (eight) hours as needed for nausea or vomiting. Started by: Zara Council, PA-C   pantoprazole 40 MG tablet Commonly known as: PROTONIX Take 1 tablet (40 mg total) by mouth daily.   tamsulosin 0.4 MG Caps capsule Commonly known as: FLOMAX Take 1 capsule (0.4 mg total) by mouth daily. Started by: Zara Council, PA-C        Allergies:  Allergies  Allergen Reactions   Metoclopramide Hcl     REACTION: Nervousness   Morphine And Related Nausea Only   Venlafaxine Other (See Comments)    Hot flashes, clammy and nausea    Family History: Family History  Problem Relation Age of Onset   COPD Mother    Heart attack Father 36   Hypertension Father    Heart disease Father        stent placement    Congenital heart disease Sister    Leukemia Brother    Anxiety disorder Maternal Uncle    Alcohol abuse Maternal Uncle    Kidney failure Maternal Grandfather    COPD Paternal Grandmother    Breast cancer Neg Hx     Social History:  reports that she has been smoking cigarettes. She has a 7.50 pack-year smoking history. She has never used smokeless tobacco. She reports current alcohol use of about 12.0 standard drinks of alcohol per week. She reports that she does not use drugs.   Physical Exam: BP 119/80   Pulse 89   Ht '5\' 5"'$  (1.651 m)   Wt 166 lb (75.3 kg)   BMI 27.62 kg/m   Constitutional:  Well nourished. Alert and oriented, No acute distress. HEENT: [place] AT, moist mucus membranes.  Trachea midline Cardiovascular: No clubbing, cyanosis, or edema. Respiratory: Normal respiratory effort, no increased work of breathing. Neurologic: Grossly intact, no focal deficits, moving all 4 extremities. Psychiatric: Normal mood and affect.    Laboratory Data:    Latest Ref Rng & Units 05/31/2022    6:53 AM 11/15/2021    3:42 PM 10/26/2021    2:05 PM  CBC  WBC  4.0 - 10.5 K/uL 6.6  8.1  6.9   Hemoglobin 12.0 - 15.0 g/dL 14.5  13.7  13.1   Hematocrit 36.0 - 46.0 % 45.0  42.0  39.2   Platelets 150 - 400 K/uL 297  264  266        Latest Ref Rng & Units 05/31/2022    6:53 AM 04/24/2022    3:58 PM 11/15/2021    3:42 PM  BMP  Glucose 70 - 99 mg/dL 97  82  84   BUN 6 - 20 mg/dL 13  16  18  Creatinine 0.44 - 1.00 mg/dL 0.85  0.75  0.84   BUN/Creat Ratio 6 - 22 (calc)  SEE NOTE:    Sodium 135 - 145 mmol/L 139  138  138   Potassium 3.5 - 5.1 mmol/L 4.0  3.9  4.0   Chloride 98 - 111 mmol/L 109  104  103   CO2 22 - 32 mmol/L '23  22  26   '$ Calcium 8.9 - 10.3 mg/dL 10.3  9.5  9.2      Urinalysis See Epic and HPI I have reviewed the labs.    Pertinent Imaging: Narrative & Impression  CLINICAL DATA:  Flank pain with kidney stone suspected.   EXAM: CT ABDOMEN AND PELVIS WITHOUT CONTRAST   TECHNIQUE: Multidetector CT imaging of the abdomen and pelvis was performed following the standard protocol without IV contrast.   RADIATION DOSE REDUCTION: This exam was performed according to the departmental dose-optimization program which includes automated exposure control, adjustment of the mA and/or kV according to patient size and/or use of iterative reconstruction technique.   COMPARISON:  05/08/2021   FINDINGS: Lower chest:  No contributory findings.   Hepatobiliary: No focal liver abnormality.Cholecystectomy.   Pancreas: Unremarkable.   Spleen: Unremarkable.   Adrenals/Urinary Tract: Negative adrenals. No hydronephrosis or stone. Unremarkable bladder.   Stomach/Bowel:  No obstruction. No appendicitis.   Vascular/Lymphatic: No acute vascular abnormality. Atheromatous calcification of the aorta. No mass or adenopathy.   Reproductive:Hysterectomy.   Other: No ascites or pneumoperitoneum.   Musculoskeletal: No acute abnormalities.   IMPRESSION: No acute finding.  No hydronephrosis or ureteral calculus.     Electronically Signed    By: Jorje Guild M.D.   On: 05/31/2022 07:24  I have independently reviewed the films.  See HPI.    Assessment & Plan:    1. Left flank pain  - KUB  negative for stone burden  - UA today w/ micro heme - Urine sent for culture  -Discussed with patient at this time we could continue to treat her clinically with Flomax and pain medication and return in 2 weeks for KUB or pursue a more detailed imaging w/ CT urogram to further evaluate for pain and now micro heme -she would like to pursue a CT urogram at this time  -orders are placed for CT urogram ASAP -Prescribed tamsulosin 0.4 mg daily, Vicodin 5/325, #10 and Zofran 4 mg as needed 6 hours for nausea -Reviewed return to clinic/ED precautions  2. Microscopic hematuria -CT urogram pending   Return for CT urogram report .  Mica Ramdass, [place] [address] [place], [place] [address] [phone]

## 2022-06-09 ENCOUNTER — Encounter: Payer: Self-pay | Admitting: Urology

## 2022-06-09 ENCOUNTER — Ambulatory Visit: Payer: 59 | Admitting: Urology

## 2022-06-09 VITALS — BP 119/80 | HR 89 | Ht 65.0 in | Wt 166.0 lb

## 2022-06-09 DIAGNOSIS — R3129 Other microscopic hematuria: Secondary | ICD-10-CM

## 2022-06-09 DIAGNOSIS — R109 Unspecified abdominal pain: Secondary | ICD-10-CM | POA: Diagnosis not present

## 2022-06-09 LAB — URINALYSIS, COMPLETE
Bilirubin, UA: NEGATIVE
Glucose, UA: NEGATIVE
Leukocytes,UA: NEGATIVE
Nitrite, UA: NEGATIVE
Protein,UA: NEGATIVE
Specific Gravity, UA: 1.025 (ref 1.005–1.030)
Urobilinogen, Ur: 0.2 mg/dL (ref 0.2–1.0)
pH, UA: 5 (ref 5.0–7.5)

## 2022-06-09 LAB — MICROSCOPIC EXAMINATION

## 2022-06-09 LAB — BLADDER SCAN AMB NON-IMAGING: Scan Result: 0

## 2022-06-09 MED ORDER — TAMSULOSIN HCL 0.4 MG PO CAPS: 0.4000 mg | ORAL_CAPSULE | Freq: Every day | ORAL | 3 refills | Status: DC

## 2022-06-09 MED ORDER — ONDANSETRON HCL 4 MG PO TABS: 4.0000 mg | ORAL_TABLET | Freq: Three times a day (TID) | ORAL | 0 refills | Status: DC | PRN

## 2022-06-09 MED ORDER — HYDROCODONE-ACETAMINOPHEN 5-325 MG PO TABS: 1.0000 | ORAL_TABLET | Freq: Four times a day (QID) | ORAL | 0 refills | Status: DC | PRN

## 2022-06-10 ENCOUNTER — Ambulatory Visit
Admission: RE | Admit: 2022-06-10 | Discharge: 2022-06-10 | Disposition: A | Discharge status: Home / Self Care | Payer: 59 | Source: Ambulatory Visit | Attending: Urology | Admitting: Urology

## 2022-06-10 DIAGNOSIS — R3129 Other microscopic hematuria: Secondary | ICD-10-CM | POA: Insufficient documentation

## 2022-06-10 MED ORDER — IOHEXOL 300 MG/ML  SOLN
100.0000 mL | Freq: Once | INTRAMUSCULAR | Status: AC | PRN
  Administered 2022-06-10: 100 mL via INTRAVENOUS

## 2022-06-11 ENCOUNTER — Telehealth: Payer: Self-pay

## 2022-06-11 MED ORDER — SULFAMETHOXAZOLE-TRIMETHOPRIM 800-160 MG PO TABS: 1.0000 | ORAL_TABLET | Freq: Two times a day (BID) | ORAL | 0 refills | Status: AC

## 2022-06-11 NOTE — Telephone Encounter (Signed)
Pt notified and verbalized understanding. Abx Rx sent to pts pharmacy.

## 2022-06-11 NOTE — Telephone Encounter (Signed)
-----   Message from Nori Riis, PA-C sent at 06/11/2022 10:06 AM EDT ----- Please let Rebecca Allen know that her CT did not show a stone that we can operate on or treat with ESWL, but I still would continue the Flomax daily , Vicodin for pain and nausea medication as needed.  I would also like her to start Septra DS, twice daily in case there is infection contributing to her symptoms.  Urine culture is pending.

## 2022-06-12 LAB — CULTURE, URINE COMPREHENSIVE

## 2022-06-15 ENCOUNTER — Telehealth: Payer: Self-pay | Admitting: Family Medicine

## 2022-06-15 NOTE — Telephone Encounter (Signed)
-----   Message from Nori Riis, PA-C sent at 06/15/2022  7:59 AM EST ----- Please let Rebecca Allen know that her urine culture was negative for infection.  I recommend proceeding with a cysto at this time for further investigations into her symptoms.

## 2022-06-15 NOTE — Telephone Encounter (Signed)
Patient notified and voiced understanding. Appointment made for cysto.

## 2022-06-25 ENCOUNTER — Other Ambulatory Visit: Payer: 59 | Admitting: Urology

## 2022-06-26 ENCOUNTER — Other Ambulatory Visit: Payer: 59 | Admitting: Urology

## 2022-07-07 ENCOUNTER — Telehealth: Payer: Self-pay | Admitting: Family Medicine

## 2022-07-07 NOTE — Telephone Encounter (Signed)
Looks like this was coded correctly to me ( as an annual physical)..but can you take a look at it?

## 2022-07-07 NOTE — Telephone Encounter (Signed)
Pt called asking if the cpe she had with [employer] on 2022-05-18 can be coded as a annual physical? Pt was told by her insurance that te coding wasn't correct. Call back # [phone].

## 2022-07-07 NOTE — Telephone Encounter (Signed)
Tish notified by telephone that visit was coded correctly and paid in full per billing department.

## 2022-07-08 ENCOUNTER — Encounter: Payer: Self-pay | Admitting: Urology

## 2022-07-08 ENCOUNTER — Ambulatory Visit (INDEPENDENT_AMBULATORY_CARE_PROVIDER_SITE_OTHER): Payer: 59 | Admitting: Urology

## 2022-07-08 VITALS — BP 101/69 | HR 51 | Ht 65.0 in | Wt 163.0 lb

## 2022-07-08 DIAGNOSIS — N200 Calculus of kidney: Secondary | ICD-10-CM

## 2022-07-08 DIAGNOSIS — R3129 Other microscopic hematuria: Secondary | ICD-10-CM | POA: Diagnosis not present

## 2022-07-08 NOTE — Progress Notes (Signed)
   07/08/22  CC:  Chief Complaint  Patient presents with   Cysto    HPI: Refer to [facility]'s note 06/09/2022.  CTU 06/10/2022 showed a 1 mm nonobstructing right upper pole calculus.  Her back pain is improved and states the discomfort is just above the left iliac crest  Blood pressure 101/69, pulse (!) 51, height '5\' 5"'$  (1.651 m), weight 163 lb (73.9 kg). NED. A&Ox3.   No respiratory distress   Abd soft, NT, ND Normal external genitalia with patent urethral meatus  Cystoscopy Procedure Note  Patient identification was confirmed, informed consent was obtained, and patient was prepped using Betadine solution.  Lidocaine jelly was administered per urethral meatus.    Procedure: - Flexible cystoscope introduced, without any difficulty.   - Thorough search of the bladder revealed:    normal urethral meatus    normal urothelium    no stones    no ulcers     no tumors    no urethral polyps    no trabeculation  - Ureteral orifices were normal in position and appearance.  Post-Procedure: - Patient tolerated the procedure well  Assessment/ Plan: No lower tract abnormalities on cystoscopy No upper tract abnormalities on CTU that would account for her pain.  Her pain is low and we discussed possibility of musculoskeletal pain and if persistent would recommend PCP follow-up She does have a small renal calculus which is nonobstructing Follow-up PA visit 6 months for repeat UA    Abbie Sons, MD

## 2022-07-09 LAB — MICROSCOPIC EXAMINATION: Epithelial Cells (non renal): >10 /hpf — AB (ref 0–10)

## 2022-07-09 LAB — URINALYSIS, COMPLETE
Bilirubin, UA: NEGATIVE
Glucose, UA: NEGATIVE
Ketones, UA: NEGATIVE
Leukocytes,UA: NEGATIVE
Nitrite, UA: NEGATIVE
RBC, UA: NEGATIVE
Specific Gravity, UA: 1.025 (ref 1.005–1.030)
Urobilinogen, Ur: 0.2 mg/dL (ref 0.2–1.0)
pH, UA: 5 (ref 5.0–7.5)

## 2022-07-15 ENCOUNTER — Telehealth: Payer: Self-pay | Admitting: Family Medicine

## 2022-07-15 ENCOUNTER — Encounter: Payer: Self-pay | Admitting: *Deleted

## 2022-07-15 NOTE — Telephone Encounter (Signed)
Patient came by and dropped off a physical result form from [employer] that she would like filled out and faxed. Form left up front in [employer].

## 2022-07-15 NOTE — Telephone Encounter (Signed)
Form completed and placed in Dr. Rometta Emery office inbox to sign.

## 2022-07-15 NOTE — Telephone Encounter (Signed)
Completed from faxed to [phone] as requested.

## 2022-07-15 NOTE — Telephone Encounter (Signed)
Competed and in [employer]

## 2022-07-16 ENCOUNTER — Other Ambulatory Visit: Payer: Self-pay | Admitting: Family Medicine

## 2022-07-16 MED ORDER — ALPRAZOLAM 0.5 MG PO TABS: 0.5000 mg | ORAL_TABLET | Freq: Every day | ORAL | 0 refills | Status: DC | PRN

## 2022-07-16 NOTE — Telephone Encounter (Signed)
Last office visit 06/04/2022 for hospital follow up.  Last refilled 04/24/22 for #20 with no refills.  No future appointments with PCP.

## 2022-07-22 ENCOUNTER — Encounter: Payer: Self-pay | Admitting: Emergency Medicine

## 2022-07-22 ENCOUNTER — Emergency Department
Admission: EM | Admit: 2022-07-22 | Discharge: 2022-07-22 | Disposition: A | Discharge status: Home / Self Care | Payer: 59 | Attending: Student in an Organized Health Care Education/Training Program | Admitting: Student in an Organized Health Care Education/Training Program

## 2022-07-22 ENCOUNTER — Emergency Department: Payer: 59

## 2022-07-22 ENCOUNTER — Other Ambulatory Visit: Payer: Self-pay

## 2022-07-22 DIAGNOSIS — R072 Precordial pain: Secondary | ICD-10-CM | POA: Insufficient documentation

## 2022-07-22 DIAGNOSIS — R079 Chest pain, unspecified: Secondary | ICD-10-CM

## 2022-07-22 LAB — CBC
HCT: 44.7 % (ref 36.0–46.0)
Hemoglobin: 14.6 g/dL (ref 12.0–15.0)
MCH: 30.4 pg (ref 26.0–34.0)
MCHC: 32.7 g/dL (ref 30.0–36.0)
MCV: 92.9 fL (ref 80.0–100.0)
Platelets: 251 10*3/uL (ref 150–400)
RBC: 4.81 MIL/uL (ref 3.87–5.11)
RDW: 12.6 % (ref 11.5–15.5)
WBC: 6.8 10*3/uL (ref 4.0–10.5)
nRBC: 0 % (ref 0.0–0.2)

## 2022-07-22 LAB — BASIC METABOLIC PANEL
Anion gap: 9 (ref 5–15)
BUN: 17 mg/dL (ref 6–20)
CO2: 21 mmol/L — ABNORMAL LOW (ref 22–32)
Calcium: 9.1 mg/dL (ref 8.9–10.3)
Chloride: 107 mmol/L (ref 98–111)
Creatinine, Ser: 0.73 mg/dL (ref 0.44–1.00)
GFR, Estimated: >60 mL/min (ref >60)
Glucose, Bld: 103 mg/dL — ABNORMAL HIGH (ref 70–99)
Potassium: 3.8 mmol/L (ref 3.5–5.1)
Sodium: 137 mmol/L (ref 135–145)

## 2022-07-22 LAB — TROPONIN I (HIGH SENSITIVITY)
Troponin I (High Sensitivity): <2 ng/L (ref <18)
Troponin I (High Sensitivity): <2 ng/L (ref <18)

## 2022-07-22 LAB — D-DIMER, QUANTITATIVE: D-Dimer, Quant: 0.28 ug/mL-FEU (ref 0.00–0.50)

## 2022-07-22 MED ORDER — OXYCODONE-ACETAMINOPHEN 5-325 MG PO TABS
1.0000 | ORAL_TABLET | Freq: Once | ORAL | Status: AC
  Administered 2022-07-22: 1 via ORAL
  Filled 2022-07-22: qty 1

## 2022-07-22 NOTE — ED Provider Notes (Signed)
[facility] Provider Note    Event Date/Time   First MD Initiated Contact with Patient 07/22/22 1154     (approximate)   History   Chest Pain   HPI  Rebecca Allen is a 56 y.o. female history of GERD hyperlipidemia depression as well as anxiety presents to the ER for evaluation of midsternal nonradiating chest pain occurred while she was shopping today.  She denies any shortness of breath this time.  She took aspirin.  Denies any abdominal pain.  No numbness or tingling.  She was playing with her grandson yesterday but does not recall feeling like she injured herself.     Physical Exam   Triage Vital Signs: ED Triage Vitals  Enc Vitals Group     BP 07/22/22 1049 (!) 97/56     Pulse Rate 07/22/22 1049 73     Resp 07/22/22 1049 18     Temp 07/22/22 1049 97.6 F (36.4 C)     Temp Source 07/22/22 1049 Oral     SpO2 07/22/22 1049 95 %     Weight 07/22/22 1054 163 lb (73.9 kg)     Height 07/22/22 1054 '5\' 5"'$  (1.651 m)     Head Circumference --      Peak Flow --      Pain Score 07/22/22 1053 8     Pain Loc --      Pain Edu? --      Excl. in [place]? --     Most recent vital signs: Vitals:   07/22/22 1049 07/22/22 1334  BP: (!) 97/56 115/62  Pulse: 73 70  Resp: 18 18  Temp: 97.6 F (36.4 C) 97.6 F (36.4 C)  SpO2: 95% 98%     Constitutional: Alert  Eyes: Conjunctivae are normal.  Head: Atraumatic. Nose: No congestion/rhinnorhea. Mouth/Throat: Mucous membranes are moist.   Neck: Painless ROM.  Cardiovascular:   Good peripheral circulation. Respiratory: Normal respiratory effort.  No retractions.  Gastrointestinal: Soft and nontender.  Musculoskeletal:  no deformity Neurologic:  MAE spontaneously. No gross focal neurologic deficits are appreciated.  Skin:  Skin is warm, dry and intact. No rash noted. Psychiatric: Mood and affect are normal. Speech and behavior are normal.    ED Results / Procedures / Treatments   Labs (all labs  ordered are listed, but only abnormal results are displayed) Labs Reviewed  BASIC METABOLIC PANEL - Abnormal; Notable for the following components:      Result Value   CO2 21 (*)    Glucose, Bld 103 (*)    All other components within normal limits  CBC  D-DIMER, QUANTITATIVE  POC URINE PREG, ED  TROPONIN I (HIGH SENSITIVITY)  TROPONIN I (HIGH SENSITIVITY)     EKG  ED ECG REPORT I, Merlyn Lot, the attending physician, personally viewed and interpreted this ECG.   Date: 07/22/2022  EKG Time: 10:47  Rate: 75  Rhythm: sinus  Axis: normal  Intervals:normal  ST&T Change: no stemi, no depressions    RADIOLOGY Please see ED Course for my review and interpretation.  I personally reviewed all radiographic images ordered to evaluate for the above acute complaints and reviewed radiology reports and findings.  These findings were personally discussed with the patient.  Please see medical record for radiology report.    PROCEDURES:  Critical Care performed: No  Procedures   MEDICATIONS ORDERED IN ED: Medications  oxyCODONE-acetaminophen (PERCOCET/ROXICET) 5-325 MG per tablet 1 tablet (1 tablet Oral Given 07/22/22 1248)  IMPRESSION / MDM / ASSESSMENT AND PLAN / ED COURSE  I reviewed the triage vital signs and the nursing notes.                              Differential diagnosis includes, but is not limited to, ACS, pericarditis, esophagitis, boerhaaves, pe, dissection, pna, bronchitis, costochondritis  Patient presenting to the ER for evaluation of symptoms as described above.  Based on symptoms, risk factors and considered above differential, this presenting complaint could reflect a potentially life-threatening illness therefore the patient will be placed on continuous pulse oximetry and telemetry for monitoring.  Laboratory evaluation will be sent to evaluate for the above complaints.      Clinical Course as of 07/22/22 1347  Wed Jul 22, 2022  1318 D-dimer  negative.  Initial troponin negative.  Awaiting repeat troponin. [PR]  7915 Pete troponin negative.  Patient well-appearing.  I suspect musculoskeletal strain.  Does appear stable and appropriate for outpatient follow-up. [PR]    Clinical Course User Index [PR] Merlyn Lot, MD    FINAL CLINICAL IMPRESSION(S) / ED DIAGNOSES   Final diagnoses:  Chest pain, unspecified type     Rx / DC Orders   ED Discharge Orders     None        Note:  This document was prepared using Dragon voice recognition software and may include unintentional dictation errors.    Merlyn Lot, MD 07/22/22 [phone]

## 2022-07-22 NOTE — ED Provider Triage Note (Signed)
  Emergency Medicine Provider Triage Evaluation Note  Rebecca Allen , a 56 y.o.female,  was evaluated in triage.  Pt complains of chest pain that started approximately 1 hour ago.  She states that it is centralized with radiation to her jaw and left shoulder.  She states that this is never happened to her before.  Denies any other symptoms.   Review of Systems  Positive: Chest pain Negative: Denies fever, abdominal pain, vomiting  Physical Exam   Vitals:   07/22/22 1049  BP: (!) 97/56  Pulse: 73  Resp: 18  Temp: 97.6 F (36.4 C)  SpO2: 95%   Gen:   Awake, no distress   Resp:  Normal effort  MSK:   Moves extremities without difficulty  Other:    Medical Decision Making  Given the patient's initial medical screening exam, the following diagnostic evaluation has been ordered. The patient will be placed in the appropriate treatment space, once one is available, to complete the evaluation and treatment. I have discussed the plan of care with the patient and I have advised the patient that an ED physician or mid-level practitioner will reevaluate their condition after the test results have been received, as the results may give them additional insight into the type of treatment they may need.    Diagnostics: Labs, EKG, CXR  Treatments: none immediately   Teodoro Spray, [place] 07/22/22 1129

## 2022-07-22 NOTE — ED Triage Notes (Signed)
Patient arrives ambulatory by POV c/o central chest pain onset about 25 ins ago. Patient describes it as something sitting on her chest. Took 4 baby aspirin.

## 2022-07-23 ENCOUNTER — Telehealth: Payer: Self-pay | Admitting: Family Medicine

## 2022-07-23 NOTE — Telephone Encounter (Signed)
Patient called and stated she was seen at the ED yesterday for her back pain and she stated ibuprofen is not working for her and she wanted a call back from [place]. Call back number [phone].

## 2022-07-23 NOTE — Telephone Encounter (Signed)
Please triage

## 2022-07-23 NOTE — Telephone Encounter (Signed)
I spoke with pt; pt said she was seen in ED on 07/22/22. Pt said she has been taking OTC ibuprofen 200 mg taking 4 tabs q 6 h. Pt said the ibuprofen helps slightly but ibuprofen really bothers pts stomach. Pt said she is not sure why back is hurting. Pt said she has been doing some lifting including lifting and playing with her grandson. Pt said the pain is sharp to dull in mid upper back in between shoulder blades and that radiates to front of mid chest. Pt said test were run from ED and heart was OK. Pt said she has taken a muscle relaxant before but it makes pt so sleepy that one muscle relaxant takes pt 3 days to sleep off. Pt has been trying moist heating pad that helps a little bit but not a lot.  Pt wants to know if could get pain med sent to [address]. Pt said now pain level is 8. Pt does not want to go back to ED.no available appts at [facility] on 07/23/22 or 07/24/22. Sending note to Dr Bascom Levels pool and Lattie Haw CMA who is working with Dr Diona Browner today.

## 2022-07-24 MED ORDER — DICLOFENAC SODIUM 75 MG PO TBEC: 75.0000 mg | DELAYED_RELEASE_TABLET | Freq: Two times a day (BID) | ORAL | 0 refills | Status: DC

## 2022-07-24 NOTE — Telephone Encounter (Signed)
Call Stop ibuprofen. ER physician felt likely musculoskeletal strain given negative cardiac workup. I would recommend continued heat and gentle stretching.  I will send in diclofenac 75 mg twice daily to help with pain.  Please have her make a follow-up appointment when available to make sure things are improving.  If pain is severe and not improving with this medication she needs to be seen at [facility] stat on urgent care.

## 2022-07-24 NOTE — Telephone Encounter (Signed)
Tish notified as instructed by telephone.  Patient states understanding.

## 2022-08-10 ENCOUNTER — Other Ambulatory Visit: Payer: Self-pay

## 2022-08-10 ENCOUNTER — Emergency Department
Admission: EM | Admit: 2022-08-10 | Discharge: 2022-08-11 | Discharge status: Against Medical Advice | Payer: 59 | Attending: Emergency Medicine | Admitting: Emergency Medicine

## 2022-08-10 ENCOUNTER — Encounter: Payer: Self-pay | Admitting: Emergency Medicine

## 2022-08-10 DIAGNOSIS — R079 Chest pain, unspecified: Secondary | ICD-10-CM | POA: Insufficient documentation

## 2022-08-10 DIAGNOSIS — Z5321 Procedure and treatment not carried out due to patient leaving prior to being seen by health care provider: Secondary | ICD-10-CM | POA: Diagnosis not present

## 2022-08-10 NOTE — ED Triage Notes (Signed)
Patient ambulatory to triage with steady gait, without difficulty or distress noted; pt reports upper CP radiating into back and also rt flank/side pain tonight; denies any accomp symptoms

## 2022-08-11 ENCOUNTER — Emergency Department: Payer: 59

## 2022-08-11 LAB — CBC WITH DIFFERENTIAL/PLATELET
Abs Immature Granulocytes: 0.01 10*3/uL (ref 0.00–0.07)
Basophils Absolute: 0.1 10*3/uL (ref 0.0–0.1)
Basophils Relative: 1 %
Eosinophils Absolute: 0.2 10*3/uL (ref 0.0–0.5)
Eosinophils Relative: 3 %
HCT: 40.7 % (ref 36.0–46.0)
Hemoglobin: 13.5 g/dL (ref 12.0–15.0)
Immature Granulocytes: 0 %
Lymphocytes Relative: 46 %
Lymphs Abs: 3 10*3/uL (ref 0.7–4.0)
MCH: 31 pg (ref 26.0–34.0)
MCHC: 33.2 g/dL (ref 30.0–36.0)
MCV: 93.6 fL (ref 80.0–100.0)
Monocytes Absolute: 0.4 10*3/uL (ref 0.1–1.0)
Monocytes Relative: 7 %
Neutro Abs: 2.8 10*3/uL (ref 1.7–7.7)
Neutrophils Relative %: 43 %
Platelets: 253 10*3/uL (ref 150–400)
RBC: 4.35 MIL/uL (ref 3.87–5.11)
RDW: 13.2 % (ref 11.5–15.5)
WBC: 6.5 10*3/uL (ref 4.0–10.5)
nRBC: 0 % (ref 0.0–0.2)

## 2022-08-11 LAB — URINALYSIS, ROUTINE W REFLEX MICROSCOPIC
Bacteria, UA: NONE SEEN
Bilirubin Urine: NEGATIVE
Glucose, UA: NEGATIVE mg/dL
Hgb urine dipstick: NEGATIVE
Ketones, ur: 5 mg/dL — AB
Leukocytes,Ua: NEGATIVE
Nitrite: NEGATIVE
Protein, ur: NEGATIVE mg/dL
Specific Gravity, Urine: 1.019 (ref 1.005–1.030)
pH: 7 (ref 5.0–8.0)

## 2022-08-11 LAB — COMPREHENSIVE METABOLIC PANEL
ALT: 34 U/L (ref 0–44)
AST: 24 U/L (ref 15–41)
Albumin: 4 g/dL (ref 3.5–5.0)
Alkaline Phosphatase: 51 U/L (ref 38–126)
Anion gap: 11 (ref 5–15)
BUN: 19 mg/dL (ref 6–20)
CO2: 21 mmol/L — ABNORMAL LOW (ref 22–32)
Calcium: 9.1 mg/dL (ref 8.9–10.3)
Chloride: 104 mmol/L (ref 98–111)
Creatinine, Ser: 0.78 mg/dL (ref 0.44–1.00)
GFR, Estimated: >60 mL/min (ref >60)
Glucose, Bld: 92 mg/dL (ref 70–99)
Potassium: 3.8 mmol/L (ref 3.5–5.1)
Sodium: 136 mmol/L (ref 135–145)
Total Bilirubin: 0.5 mg/dL (ref 0.3–1.2)
Total Protein: 6.6 g/dL (ref 6.5–8.1)

## 2022-08-11 LAB — TROPONIN I (HIGH SENSITIVITY): Troponin I (High Sensitivity): 3 ng/L (ref <18)

## 2022-08-11 MED ORDER — FENTANYL CITRATE PF 50 MCG/ML IJ SOSY
50.0000 ug | PREFILLED_SYRINGE | INTRAMUSCULAR | Status: DC | PRN
  Administered 2022-08-11: 50 ug via INTRAVENOUS
  Filled 2022-08-11: qty 1

## 2022-08-12 ENCOUNTER — Telehealth: Payer: Self-pay

## 2022-08-12 NOTE — Telephone Encounter (Signed)
Transition Care Management Follow-up Telephone Call Date of discharge and from where: 08/11/2021 [facility] ED (left AMA) How have you been since you were released from the hospital? Still having pain but getting a little better Any questions or concerns? No  Items Reviewed: Did the pt receive and understand the discharge instructions provided?  Left AMA Medications obtained and verified?  Left AMA Other? No  Any new allergies since your discharge? No  Dietary orders reviewed? No Do you have support at home? Yes   Home Care and Equipment/Supplies: Were home health services ordered? not applicable If so, what is the name of the agency? N/a  Has the agency set up a time to come to the patient's home? not applicable Were any new equipment or medical supplies ordered?  No What is the name of the medical supply agency? N/a Were you able to get the supplies/equipment? not applicable Do you have any questions related to the use of the equipment or supplies? No  Functional Questionnaire: (I = Independent and D = Dependent) ADLs: I  Bathing/Dressing- I  Meal Prep- I  Eating- I  Maintaining continence- I  Transferring/Ambulation- I  Managing Meds- I  Follow up appointments reviewed:  PCP Hospital f/u appt confirmed?  States will call if does not feel better in a couple of days.   .. Are transportation arrangements needed? No  If their condition worsens, is the pt aware to call PCP or go to the Emergency Dept.? Yes Was the patient provided with contact information for the PCP's office or ED? Yes Was to pt encouraged to call back with questions or concerns? Yes

## 2022-08-22 ENCOUNTER — Other Ambulatory Visit: Payer: Self-pay | Admitting: Family Medicine

## 2022-08-23 NOTE — Telephone Encounter (Signed)
Last office visit 06/04/22 for hosptial follow up.  Last refilled 07/24/22 for #30 with no refills.  No future appointments with PCP.

## 2022-08-30 ENCOUNTER — Other Ambulatory Visit: Payer: Self-pay

## 2022-08-30 ENCOUNTER — Emergency Department
Admission: EM | Admit: 2022-08-30 | Discharge: 2022-08-30 | Disposition: A | Discharge status: Home / Self Care | Payer: 59 | Attending: Student in an Organized Health Care Education/Training Program | Admitting: Student in an Organized Health Care Education/Training Program

## 2022-08-30 ENCOUNTER — Emergency Department: Payer: 59

## 2022-08-30 DIAGNOSIS — F410 Panic disorder [episodic paroxysmal anxiety] without agoraphobia: Secondary | ICD-10-CM | POA: Diagnosis not present

## 2022-08-30 DIAGNOSIS — R0789 Other chest pain: Secondary | ICD-10-CM | POA: Insufficient documentation

## 2022-08-30 HISTORY — DX: Anxiety disorder, unspecified: F41.9

## 2022-08-30 LAB — CBC
HCT: 41.3 % (ref 36.0–46.0)
Hemoglobin: 13.7 g/dL (ref 12.0–15.0)
MCH: 31 pg (ref 26.0–34.0)
MCHC: 33.2 g/dL (ref 30.0–36.0)
MCV: 93.4 fL (ref 80.0–100.0)
Platelets: 285 10*3/uL (ref 150–400)
RBC: 4.42 MIL/uL (ref 3.87–5.11)
RDW: 13.2 % (ref 11.5–15.5)
WBC: 7.5 10*3/uL (ref 4.0–10.5)
nRBC: 0 % (ref 0.0–0.2)

## 2022-08-30 LAB — TROPONIN I (HIGH SENSITIVITY): Troponin I (High Sensitivity): <2 ng/L (ref <18)

## 2022-08-30 LAB — BASIC METABOLIC PANEL
Anion gap: 6 (ref 5–15)
BUN: 14 mg/dL (ref 6–20)
CO2: 23 mmol/L (ref 22–32)
Calcium: 8.8 mg/dL — ABNORMAL LOW (ref 8.9–10.3)
Chloride: 106 mmol/L (ref 98–111)
Creatinine, Ser: 0.69 mg/dL (ref 0.44–1.00)
GFR, Estimated: >60 mL/min (ref >60)
Glucose, Bld: 114 mg/dL — ABNORMAL HIGH (ref 70–99)
Potassium: 3.6 mmol/L (ref 3.5–5.1)
Sodium: 135 mmol/L (ref 135–145)

## 2022-08-30 MED ORDER — DIAZEPAM 5 MG PO TABS
5.0000 mg | ORAL_TABLET | Freq: Once | ORAL | Status: AC
  Administered 2022-08-30: 5 mg via ORAL
  Filled 2022-08-30: qty 1

## 2022-08-30 NOTE — ED Notes (Signed)
Pt reports to first nurse that she has a hx of anxiety and that this feels similar, but that she can not get it under control.  Pt states she has taken her PRN medications without relief.  HR 62, O2 sats 98%

## 2022-08-30 NOTE — ED Provider Notes (Signed)
[facility] Provider Note    Event Date/Time   First MD Initiated Contact with Patient 08/30/22 1631     (approximate)   History   Panic Attack, Shortness of Breath, and Chest Pain   HPI  Rebecca Allen is a 57 y.o. female history of anxiety presents to the ER for evaluation of feeling overwhelming sense of doom and feeling like she is having a panic attack that she is not able to control with her home Xanax.  She took 2 open today.  Feels like things are spiraling.  She has been feeling like she is hyperventilating and started having some left posterior chest pain.  Denies any fevers or chills.  Not certain as to what may have precipitated the event but states that she did lose her daughter few years ago and that is always on her mind and can be a trigger.  Denies any SI or HI.     Physical Exam   Triage Vital Signs: ED Triage Vitals  Enc Vitals Group     BP 08/30/22 1553 126/65     Pulse Rate 08/30/22 1553 95     Resp 08/30/22 1553 (!) 24     Temp 08/30/22 1553 98.5 F (36.9 C)     Temp Source 08/30/22 1553 Oral     SpO2 08/30/22 1553 97 %     Weight 08/30/22 1554 160 lb 15 oz (73 kg)     Height 08/30/22 1554 '5\' 5"'$  (1.651 m)     Head Circumference --      Peak Flow --      Pain Score 08/30/22 1553 10     Pain Loc --      Pain Edu? --      Excl. in [place]? --     Most recent vital signs: Vitals:   08/30/22 1553  BP: 126/65  Pulse: 95  Resp: (!) 24  Temp: 98.5 F (36.9 C)  SpO2: 97%     Constitutional: Alert  Eyes: Conjunctivae are normal.  Head: Atraumatic. Nose: No congestion/rhinnorhea. Mouth/Throat: Mucous membranes are moist.   Neck: Painless ROM.  Cardiovascular:   Good peripheral circulation. Respiratory: Normal respiratory effort.  No retractions.  Gastrointestinal: Soft and nontender.  Musculoskeletal:  no deformity Neurologic:  MAE spontaneously. No gross focal neurologic deficits are appreciated.  Skin:  Skin is  warm, dry and intact. No rash noted. Psychiatric: Mood and affect are anxious    ED Results / Procedures / Treatments   Labs (all labs ordered are listed, but only abnormal results are displayed) Labs Reviewed  BASIC METABOLIC PANEL - Abnormal; Notable for the following components:      Result Value   Glucose, Bld 114 (*)    Calcium 8.8 (*)    All other components within normal limits  CBC  POC URINE PREG, ED  TROPONIN I (HIGH SENSITIVITY)  TROPONIN I (HIGH SENSITIVITY)     EKG  ED ECG REPORT I, Merlyn Lot, the attending physician, personally viewed and interpreted this ECG.   Date: 08/30/2022  EKG Time: 15:54  Rate: 90  Rhythm: sinus  Axis: normal  Intervals: normal  ST&T Change: normal intervals, no stemi    RADIOLOGY Please see ED Course for my review and interpretation.  I personally reviewed all radiographic images ordered to evaluate for the above acute complaints and reviewed radiology reports and findings.  These findings were personally discussed with the patient.  Please see medical record for radiology  report.    PROCEDURES:  Critical Care performed: no  Procedures   MEDICATIONS ORDERED IN ED: Medications  diazepam (VALIUM) tablet 5 mg (5 mg Oral Given 08/30/22 1640)     IMPRESSION / MDM / ASSESSMENT AND PLAN / ED COURSE  I reviewed the triage vital signs and the nursing notes.                              Differential diagnosis includes, but is not limited to, panic attack, anxiety, dysrhythmia, ACS, pneumothorax, pneumonia, musculoskeletal strain, PE  Patient presenting to the ER for evaluation of symptoms as described above.  Based on symptoms, risk factors and considered above differential, this presenting complaint could reflect a potentially life-threatening illness therefore the patient will be placed on continuous pulse oximetry and telemetry for monitoring.  Laboratory evaluation will be sent to evaluate for the above  complaints.  Exam and history is consistent with anxiety and panic attack.  Blood work is reassuring.  No leukocytosis troponin normal.  EKG nonischemic.  Not tachycardic.  Not consistent with PE.   Clinical Course as of 08/30/22 1739  Sun Aug 30, 2022  1658 Chest x-ray my review and interpretation without evidence of consolidation or infiltrate.  Troponin negative.  No leukocytosis.  Given patient's history this does appear more consistent with panic attack.  She is satting well on room air.  Will observe after giving Valium.  She denies any SI or HI. [PR]  8657 Patient reassessed.  She feels significantly improved after Valium.  Denies any pain or discomfort at this time. [PR]    Clinical Course User Index [PR] Merlyn Lot, MD     FINAL CLINICAL IMPRESSION(S) / ED DIAGNOSES   Final diagnoses:  Anxiety attack  Atypical chest pain     Rx / DC Orders   ED Discharge Orders     None        Note:  This document was prepared using Dragon voice recognition software and may include unintentional dictation errors.    Merlyn Lot, MD 08/30/22 1739

## 2022-08-30 NOTE — ED Triage Notes (Signed)
Pt to ED for panic attack since past several hours, states has not been able to get anxiety under control today despite taking her PRN Xanax.   States that ever since her daughter died in 2020-11-05 she has been having these episodes. Pt states her panic attack is making her feel SOB and with chest pain that goes through to her back, also states that skin has crawling sensation. Pt is crying on and off and appears to be in significant emotional distress but is attempting to calm self with her breathing.

## 2022-08-30 NOTE — ED Notes (Signed)
Pt appears to be more calm at this time and not crying.

## 2022-08-30 NOTE — ED Notes (Signed)
RN to bedside to introduce self to pt. Pt appears to be upset and crying. Pt cooperative at this time.

## 2022-08-31 ENCOUNTER — Telehealth: Payer: Self-pay

## 2022-08-31 NOTE — Telephone Encounter (Signed)
Transition Care Management Unsuccessful Follow-up Telephone Call  Date of discharge and from where:  [facility] 1.21.24  Attempts:  1st Attempt  Reason for unsuccessful TCM follow-up call:  Left voice message

## 2022-09-01 NOTE — Telephone Encounter (Signed)
Transition Care Management Unsuccessful Follow-up Telephone Call  Date of discharge and from where:  [facility] 1.21.24  Attempts:  2nd Attempt  Reason for unsuccessful TCM follow-up call:  Left voice message

## 2022-09-02 NOTE — Telephone Encounter (Signed)
Transition Care Management Unsuccessful Follow-up Telephone Call  Date of discharge and from where:  [facility] 1.21.24  Attempts:  3rd Attempt  Reason for unsuccessful TCM follow-up call:  Left voice message

## 2022-09-03 ENCOUNTER — Telehealth: Payer: Self-pay | Admitting: Family Medicine

## 2022-09-03 ENCOUNTER — Telehealth (INDEPENDENT_AMBULATORY_CARE_PROVIDER_SITE_OTHER): Payer: 59 | Admitting: Family Medicine

## 2022-09-03 ENCOUNTER — Encounter: Payer: Self-pay | Admitting: Family Medicine

## 2022-09-03 VITALS — Temp 97.8°F | Ht 65.0 in | Wt 160.0 lb

## 2022-09-03 DIAGNOSIS — F17200 Nicotine dependence, unspecified, uncomplicated: Secondary | ICD-10-CM | POA: Diagnosis not present

## 2022-09-03 DIAGNOSIS — R051 Acute cough: Secondary | ICD-10-CM | POA: Diagnosis not present

## 2022-09-03 MED ORDER — GUAIFENESIN-CODEINE 100-10 MG/5ML PO SYRP: 5.0000 mL | ORAL_SOLUTION | Freq: Every evening | ORAL | 0 refills | Status: DC | PRN

## 2022-09-03 MED ORDER — ALBUTEROL SULFATE HFA 108 (90 BASE) MCG/ACT IN AERS: 2.0000 | INHALATION_SPRAY | Freq: Four times a day (QID) | RESPIRATORY_TRACT | 2 refills | Status: DC | PRN

## 2022-09-03 NOTE — Progress Notes (Signed)
VIRTUAL VISIT A virtual visit is felt to be most appropriate for this patient at this time.   I connected with the patient on 09/03/22 at 11:20 AM EST by virtual telehealth platform and verified that I am speaking with the correct person using two identifiers.   I discussed the limitations, risks, security and privacy concerns of performing an evaluation and management service by  virtual telehealth platform and the availability of in person appointments. I also discussed with the patient that there may be a patient responsible charge related to this service. The patient expressed understanding and agreed to proceed.  Patient location: Home Provider Location: [place] Participants: Eliezer Lofts and [place]   Chief Complaint  Patient presents with   Nasal Congestion    X 2 days ago taking OTC and nothing is working - covid   Cough    History of Present Illness:  57 y.o. female  current smoker presents with  new onset cough and congestion   Date of onset:  2 days Initial symptoms included  stuffy nose and headache. Symptoms progressed to  constant cough, dry, copious nasal congestion  Bilateral ear pain. Mild ST. Minimal face pain.  No fever,  some chills.  SOB with coughing fits.    Sick contacts:  none COVID testing:   today negative.    She has tried to treat with  Nyquil cold and flu, mucinex, benzonatate      No history of chronic lung disease such as asthma or COPD. Non-smoker.    SE to prednisone in past.   COVID 19 screen No recent travel or known exposure to [place] The patient denies respiratory symptoms of COVID 19 at this time.  The importance of social distancing was discussed today.   ROS    Past Medical History:  Diagnosis Date   Anxiety    Depression    GERD (gastroesophageal reflux disease)    Grief at loss of child 2022   History of kidney stones    Hyperlipidemia    Kidney stone    Pneumonia    Treadmill stress  test negative for angina pectoris 12/18/2010    reports that she has been smoking cigarettes. She has a 7.50 pack-year smoking history. She has never used smokeless tobacco. She reports current alcohol use of about 12.0 standard drinks of alcohol per week. She reports that she does not use drugs.   Current Outpatient Medications:    albuterol (VENTOLIN HFA) 108 (90 Base) MCG/ACT inhaler, Inhale 2 puffs into the lungs every 6 (six) hours as needed for wheezing or shortness of breath., Disp: 8 g, Rfl: 2   ALPRAZolam (XANAX) 0.5 MG tablet, Take 1 tablet (0.5 mg total) by mouth daily as needed for anxiety., Disp: 20 tablet, Rfl: 0   aspirin 81 MG chewable tablet, Chew 81 mg by mouth daily., Disp: , Rfl:    betamethasone dipropionate 0.05 % cream, Apply topically 2 (two) times daily., Disp: 30 g, Rfl: 0   calcium carbonate (TUMS - DOSED IN MG ELEMENTAL CALCIUM) 500 MG chewable tablet, Chew 6 tablets by mouth daily as needed for indigestion or heartburn., Disp: , Rfl:    estradiol (VIVELLE-DOT) 0.05 MG/24HR patch, PLACE 1 PATCH TRANS-DERMALLY TWICE A WEEK. REMOVE OLD PATCH BEFORE APPLYING NEW PATCH., Disp: 24 patch, Rfl: 3   guaiFENesin-codeine (ROBITUSSIN AC) 100-10 MG/5ML syrup, Take 5-10 mLs by mouth at bedtime as needed for cough., Disp: 180 mL, Rfl: 0   pantoprazole (PROTONIX) 40  MG tablet, Take 1 tablet (40 mg total) by mouth daily., Disp: 90 tablet, Rfl: 3   diclofenac (VOLTAREN) 75 MG EC tablet, TAKE 1 TABLET BY MOUTH TWICE A DAY (Patient not taking: Reported on 09/03/2022), Disp: 30 tablet, Rfl: 0   tamsulosin (FLOMAX) 0.4 MG CAPS capsule, Take 1 capsule (0.4 mg total) by mouth daily. (Patient not taking: Reported on 09/03/2022), Disp: 30 capsule, Rfl: 3   Varenicline Tartrate, Starter, (CHANTIX STARTING MONTH PAK) 0.5 MG X 11 & 1 MG X 42 TBPK, Take one 0.5 mg tab daily for 3 days,then inc. to one 0.5 mg tab BID x 4 days, then inc. to one 1 mg tab BID. (Patient not taking: Reported on 09/03/2022),  Disp: 53 each, Rfl: 0   Observations/Objective: Temperature 97.8 F (36.6 C), height '5\' 5"'$  (1.651 m), weight 160 lb (72.6 kg).  Physical Exam Constitutional:      General: The patient is not in acute distress. Pulmonary:     Effort: Pulmonary effort is normal. No respiratory distress.  Neurological:     Mental Status: The patient is alert and oriented to person, place, and time.  Psychiatric:        Mood and Affect: Mood normal.        Behavior: Behavior normal.    Assessment and Plan Acute cough Assessment & Plan: Acute, Most likely secondary to viral upper respiratory tract infection.  Significant coughing component suggesting a viral bronchitis.  Offered treatment with prednisone but she states she does not tolerate this.  She will use an albuterol inhaler as needed and cough suppressant with codeine provided for bedtime use to decrease cough.  She will continue Mucinex during the day but change to DM given side effects to Mucinex max which contains decongestant.  If she is not improving as expected I recommended repeat COVID test on day 4-5 of illness.  If she has new onset fever, unilateral face pain or unilateral ear pain she will either make an appointment for reevaluation or we can consider antibiotics to cover for bacterial superinfection.  Return and ER precautions provided   Smoker  Other orders -     guaiFENesin-Codeine; Take 5-10 mLs by mouth at bedtime as needed for cough.  Dispense: 180 mL; Refill: 0 -     Albuterol Sulfate HFA; Inhale 2 puffs into the lungs every 6 (six) hours as needed for wheezing or shortness of breath.  Dispense: 8 g; Refill: 2      I discussed the assessment and treatment plan with the patient. The patient was provided an opportunity to ask questions and all were answered. The patient agreed with the plan and demonstrated an understanding of the instructions.   The patient was advised to call back or seek an in-person evaluation if the  symptoms worsen or if the condition fails to improve as anticipated.     Eliezer Lofts, MD

## 2022-09-03 NOTE — Telephone Encounter (Signed)
Spoke with Rebecca Allen.  She states [facility] does not have the Robitussin-AC but they would possible have it tomorrow.  She ask if it can be sent to a different pharmacy like [facility].  I ask her to call around to the [facility] and she finds one that has it in stock, she can call me back and we will resend.  If she can't find it,  she states she won't call me back and will just wait on [facility] tomorrow.

## 2022-09-03 NOTE — Assessment & Plan Note (Signed)
Acute, Most likely secondary to viral upper respiratory tract infection.  Significant coughing component suggesting a viral bronchitis.  Offered treatment with prednisone but she states she does not tolerate this.  She will use an albuterol inhaler as needed and cough suppressant with codeine provided for bedtime use to decrease cough.  She will continue Mucinex during the day but change to DM given side effects to Mucinex max which contains decongestant.  If she is not improving as expected I recommended repeat COVID test on day 4-5 of illness.  If she has new onset fever, unilateral face pain or unilateral ear pain she will either make an appointment for reevaluation or we can consider antibiotics to cover for bacterial superinfection.  Return and ER precautions provided

## 2022-09-03 NOTE — Telephone Encounter (Signed)
Patient called and stated the medications that were called into the pharmacy at [facility] wasn't there and may not be in until tomorrow. Requested a call back to number [phone].

## 2022-09-08 ENCOUNTER — Other Ambulatory Visit: Payer: Self-pay | Admitting: Family Medicine

## 2022-09-23 ENCOUNTER — Other Ambulatory Visit: Payer: Self-pay | Admitting: Family Medicine

## 2022-09-23 MED ORDER — ALPRAZOLAM 0.5 MG PO TABS: 0.5000 mg | ORAL_TABLET | Freq: Every day | ORAL | 0 refills | Status: DC | PRN

## 2022-09-23 NOTE — Telephone Encounter (Signed)
Acute

## 2022-09-23 NOTE — Telephone Encounter (Signed)
Last office visit 09/03/22 (video) for acute cough.  Last refilled 09/05/21 for #20 with no refills.  No future appointments with PCP.

## 2022-09-24 ENCOUNTER — Other Ambulatory Visit: Payer: Self-pay | Admitting: Family Medicine

## 2022-09-24 NOTE — Telephone Encounter (Signed)
Last office visit 09/03/22 for acute cough.  Last refilled 09/03/2022 for #180 ml with no refills.  Next Appt: No future appointments with PCP.

## 2022-10-16 ENCOUNTER — Encounter: Payer: Self-pay | Admitting: Family Medicine

## 2022-10-16 ENCOUNTER — Ambulatory Visit: Payer: 59 | Admitting: Family Medicine

## 2022-10-16 VITALS — BP 100/70 | HR 71 | Temp 97.9°F | Ht 65.0 in | Wt 163.2 lb

## 2022-10-16 DIAGNOSIS — E782 Mixed hyperlipidemia: Secondary | ICD-10-CM | POA: Diagnosis not present

## 2022-10-16 DIAGNOSIS — F331 Major depressive disorder, recurrent, moderate: Secondary | ICD-10-CM | POA: Diagnosis not present

## 2022-10-16 DIAGNOSIS — F411 Generalized anxiety disorder: Secondary | ICD-10-CM | POA: Diagnosis not present

## 2022-10-16 DIAGNOSIS — F410 Panic disorder [episodic paroxysmal anxiety] without agoraphobia: Secondary | ICD-10-CM

## 2022-10-16 DIAGNOSIS — R450 Nervousness: Secondary | ICD-10-CM | POA: Diagnosis not present

## 2022-10-16 DIAGNOSIS — F5104 Psychophysiologic insomnia: Secondary | ICD-10-CM

## 2022-10-16 LAB — LIPID PANEL
Cholesterol: 245 mg/dL — ABNORMAL HIGH (ref 0–200)
HDL: 52.3 mg/dL (ref >39.00)
LDL Cholesterol: 159 mg/dL — ABNORMAL HIGH (ref 0–99)
NonHDL: 192.76
Total CHOL/HDL Ratio: 5
Triglycerides: 168 mg/dL — ABNORMAL HIGH (ref 0.0–149.0)
VLDL: 33.6 mg/dL (ref 0.0–40.0)

## 2022-10-16 LAB — CBC WITH DIFFERENTIAL/PLATELET
Basophils Absolute: 0 10*3/uL (ref 0.0–0.1)
Basophils Relative: 0.5 % (ref 0.0–3.0)
Eosinophils Absolute: 0.1 10*3/uL (ref 0.0–0.7)
Eosinophils Relative: 1.5 % (ref 0.0–5.0)
HCT: 44.1 % (ref 36.0–46.0)
Hemoglobin: 14.7 g/dL (ref 12.0–15.0)
Lymphocytes Relative: 33.2 % (ref 12.0–46.0)
Lymphs Abs: 2 10*3/uL (ref 0.7–4.0)
MCHC: 33.4 g/dL (ref 30.0–36.0)
MCV: 94 fl (ref 78.0–100.0)
Monocytes Absolute: 0.4 10*3/uL (ref 0.1–1.0)
Monocytes Relative: 6.9 % (ref 3.0–12.0)
Neutro Abs: 3.4 10*3/uL (ref 1.4–7.7)
Neutrophils Relative %: 57.9 % (ref 43.0–77.0)
Platelets: 286 10*3/uL (ref 150.0–400.0)
RBC: 4.7 Mil/uL (ref 3.87–5.11)
RDW: 13.2 % (ref 11.5–15.5)
WBC: 5.9 10*3/uL (ref 4.0–10.5)

## 2022-10-16 LAB — COMPREHENSIVE METABOLIC PANEL
ALT: 17 U/L (ref 0–35)
AST: 15 U/L (ref 0–37)
Albumin: 4.4 g/dL (ref 3.5–5.2)
Alkaline Phosphatase: 47 U/L (ref 39–117)
BUN: 16 mg/dL (ref 6–23)
CO2: 28 mEq/L (ref 19–32)
Calcium: 9.8 mg/dL (ref 8.4–10.5)
Chloride: 103 mEq/L (ref 96–112)
Creatinine, Ser: 0.75 mg/dL (ref 0.40–1.20)
GFR: 89.91 mL/min (ref >60.00)
Glucose, Bld: 86 mg/dL (ref 70–99)
Potassium: 4.2 mEq/L (ref 3.5–5.1)
Sodium: 138 mEq/L (ref 135–145)
Total Bilirubin: 0.5 mg/dL (ref 0.2–1.2)
Total Protein: 6.8 g/dL (ref 6.0–8.3)

## 2022-10-16 LAB — HEMOGLOBIN A1C: Hgb A1c MFr Bld: 5.7 % (ref 4.6–6.5)

## 2022-10-16 LAB — VITAMIN B12: Vitamin B-12: 485 pg/mL (ref 211–911)

## 2022-10-16 LAB — VITAMIN D 25 HYDROXY (VIT D DEFICIENCY, FRACTURES): VITD: 40.97 ng/mL (ref 30.00–100.00)

## 2022-10-16 LAB — T4, FREE: Free T4: 0.71 ng/dL (ref 0.60–1.60)

## 2022-10-16 LAB — T3, FREE: T3, Free: 2.5 pg/mL (ref 2.3–4.2)

## 2022-10-16 LAB — TSH: TSH: 1.13 u[IU]/mL (ref 0.35–5.50)

## 2022-10-16 MED ORDER — ESCITALOPRAM OXALATE 5 MG PO TABS: 5.0000 mg | ORAL_TABLET | Freq: Every day | ORAL | 5 refills | Status: DC

## 2022-10-16 NOTE — Progress Notes (Signed)
Patient ID: Rebecca Allen, female    DOB: 21-Oct-1965, 56 y.o.   MRN: [id number]  This visit was conducted in person.  BP 100/70   Pulse 71   Temp 97.9 F (36.6 C) (Temporal)   Ht '5\' 5"'$  (1.651 m)   Wt 163 lb 4 oz (74 kg)   SpO2 95%   BMI 27.17 kg/m    CC:  Chief Complaint  Patient presents with   Jittery    "Don't feel normal at all"    Subjective:   HPI: Rebecca Allen is a 57 y.o. female presenting on 10/16/2022 for Jittery ("Don't feel normal at all")  She reports new onset sensation of feeling jittery.  She denies change in meds.    She has been under a lot of stress.. daughters wedding coming up, dog passed.  She is having trouble falling asleep at night.. using zzzquil. She has noted some blurry vision.. has seen eye MD, neds reading glassess   Using alprazolam prn.. feels like she needs every day.. trying to not use it.     10/16/2022   10:51 AM 04/24/2022    4:36 PM 11/18/2021    2:13 PM 08/19/2021   12:42 PM  GAD 7 : Generalized Anxiety Score  Nervous, Anxious, on Edge '3 2 3 3  '$ Control/stop worrying '3 2 3 3  '$ Worry too much - different things '3 2 3 3  '$ Trouble relaxing '2 1 3 3  '$ Restless '2 1 2 2  '$ Easily annoyed or irritable '2 2 3 2  '$ Afraid - awful might happen '2 2 3 2  '$ Total GAD 7 Score '17 12 20 18  '$ Anxiety Difficulty Extremely difficult Very difficult Extremely difficult Very difficult       10/16/2022   10:50 AM 09/03/2022   11:37 AM 04/24/2022    4:36 PM  Depression screen PHQ 2/9  Decreased Interest 1 0 2  Down, Depressed, Hopeless '3 1 1  '$ PHQ - 2 Score '4 1 3  '$ Altered sleeping '3 1 2  '$ Tired, decreased energy '2 3 2  '$ Change in appetite 1 0 1  Feeling bad or failure about yourself  1 0 1  Trouble concentrating 0 0 0  Moving slowly or fidgety/restless 0 0 0  Suicidal thoughts 0 0 0  PHQ-9 Score '11 5 9  '$ Difficult doing work/chores Very difficult Somewhat difficult Somewhat difficult     Reviewed labs from past, last TSH November 18, 2021  0.96       Relevant past medical, surgical, family and social history reviewed and updated as indicated. Interim medical history since our last visit reviewed. Allergies and medications reviewed and updated. Outpatient Medications Prior to Visit  Medication Sig Dispense Refill   ALPRAZolam (XANAX) 0.5 MG tablet Take 1 tablet (0.5 mg total) by mouth daily as needed for anxiety. 20 tablet 0   aspirin 81 MG chewable tablet Chew 81 mg by mouth daily.     betamethasone dipropionate 0.05 % cream Apply topically 2 (two) times daily. 30 g 0   calcium carbonate (TUMS - DOSED IN MG ELEMENTAL CALCIUM) 500 MG chewable tablet Chew 6 tablets by mouth daily as needed for indigestion or heartburn.     estradiol (VIVELLE-DOT) 0.05 MG/24HR patch PLACE 1 PATCH TRANS-DERMALLY TWICE A WEEK. REMOVE OLD PATCH BEFORE APPLYING NEW PATCH. 24 patch 3   pantoprazole (PROTONIX) 40 MG tablet TAKE 1 TABLET BY MOUTH EVERY DAY 90 tablet 1   Varenicline Tartrate, Starter, (CHANTIX  STARTING MONTH PAK) 0.5 MG X 11 & 1 MG X 42 TBPK Take one 0.5 mg tab daily for 3 days,then inc. to one 0.5 mg tab BID x 4 days, then inc. to one 1 mg tab BID. (Patient not taking: Reported on 09/03/2022) 53 each 0   albuterol (VENTOLIN HFA) 108 (90 Base) MCG/ACT inhaler Inhale 2 puffs into the lungs every 6 (six) hours as needed for wheezing or shortness of breath. 8 g 2   diclofenac (VOLTAREN) 75 MG EC tablet TAKE 1 TABLET BY MOUTH TWICE A DAY (Patient not taking: Reported on 09/03/2022) 30 tablet 0   guaiFENesin-codeine 100-10 MG/5ML syrup TAKE 5-10 MLS BY MOUTH AT BEDTIME AS NEEDED FOR COUGH. 120 mL 1   tamsulosin (FLOMAX) 0.4 MG CAPS capsule Take 1 capsule (0.4 mg total) by mouth daily. (Patient not taking: Reported on 09/03/2022) 30 capsule 3   No facility-administered medications prior to visit.     Per HPI unless specifically indicated in ROS section below Review of Systems  Constitutional:  Negative for fatigue and fever.  HENT:  Negative  for congestion.   Eyes:  Negative for pain.  Respiratory:  Negative for cough and shortness of breath.   Cardiovascular:  Negative for chest pain, palpitations and leg swelling.  Gastrointestinal:  Negative for abdominal pain.  Genitourinary:  Negative for dysuria and vaginal bleeding.  Musculoskeletal:  Negative for back pain.  Neurological:  Positive for tremors. Negative for syncope, light-headedness and headaches.  Psychiatric/Behavioral:  Positive for agitation and sleep disturbance. Negative for dysphoric mood. The patient is nervous/anxious.    Objective:  BP 100/70   Pulse 71   Temp 97.9 F (36.6 C) (Temporal)   Ht '5\' 5"'$  (Q000111Q m)   Wt 163 lb 4 oz (74 kg)   SpO2 95%   BMI 27.17 kg/m   Wt Readings from Last 3 Encounters:  10/16/22 163 lb 4 oz (74 kg)  09/03/22 160 lb (72.6 kg)  08/30/22 160 lb 15 oz (73 kg)      Physical Exam Constitutional:      General: She is not in acute distress.    Appearance: Normal appearance. She is well-developed. She is not ill-appearing or toxic-appearing.  HENT:     Head: Normocephalic.     Right Ear: Hearing, tympanic membrane, ear canal and external ear normal. Tympanic membrane is not erythematous, retracted or bulging.     Left Ear: Hearing, tympanic membrane, ear canal and external ear normal. Tympanic membrane is not erythematous, retracted or bulging.     Nose: No mucosal edema or rhinorrhea.     Right Sinus: No maxillary sinus tenderness or frontal sinus tenderness.     Left Sinus: No maxillary sinus tenderness or frontal sinus tenderness.     Mouth/Throat:     Pharynx: Uvula midline.  Eyes:     General: Lids are normal. Lids are everted, no foreign bodies appreciated.     Conjunctiva/sclera: Conjunctivae normal.     Pupils: Pupils are equal, round, and reactive to light.  Neck:     Thyroid: No thyroid mass or thyromegaly.     Vascular: No carotid bruit.     Trachea: Trachea normal.  Cardiovascular:     Rate and Rhythm:  Normal rate and regular rhythm.     Pulses: Normal pulses.     Heart sounds: Normal heart sounds, S1 normal and S2 normal. No murmur heard.    No friction rub. No gallop.  Pulmonary:  Effort: Pulmonary effort is normal. No tachypnea or respiratory distress.     Breath sounds: Normal breath sounds. No decreased breath sounds, wheezing, rhonchi or rales.  Abdominal:     General: Bowel sounds are normal.     Palpations: Abdomen is soft.     Tenderness: There is no abdominal tenderness.  Musculoskeletal:     Cervical back: Normal range of motion and neck supple.  Skin:    General: Skin is warm and dry.     Findings: No rash.  Neurological:     Mental Status: She is alert.  Psychiatric:        Attention and Perception: Attention normal.        Mood and Affect: Mood is anxious. Mood is not depressed.        Speech: Speech is rapid and pressured.        Behavior: Behavior is agitated. Behavior is cooperative.        Thought Content: Thought content normal.        Judgment: Judgment normal.       Results for orders placed or performed during the hospital encounter of [id number]  Basic metabolic panel  Result Value Ref Range   Sodium 135 135 - 145 mmol/L   Potassium 3.6 3.5 - 5.1 mmol/L   Chloride 106 98 - 111 mmol/L   CO2 23 22 - 32 mmol/L   Glucose, Bld 114 (H) 70 - 99 mg/dL   BUN 14 6 - 20 mg/dL   Creatinine, Ser 0.69 0.44 - 1.00 mg/dL   Calcium 8.8 (L) 8.9 - 10.3 mg/dL   GFR, Estimated >60 >60 mL/min   Anion gap 6 5 - 15  CBC  Result Value Ref Range   WBC 7.5 4.0 - 10.5 K/uL   RBC 4.42 3.87 - 5.11 MIL/uL   Hemoglobin 13.7 12.0 - 15.0 g/dL   HCT 41.3 36.0 - 46.0 %   MCV 93.4 80.0 - 100.0 fL   MCH 31.0 26.0 - 34.0 pg   MCHC 33.2 30.0 - 36.0 g/dL   RDW 13.2 11.5 - 15.5 %   Platelets 285 150 - 400 K/uL   nRBC 0.0 0.0 - 0.2 %  Troponin I (High Sensitivity)  Result Value Ref Range   Troponin I (High Sensitivity) <2 <18 ng/L    Assessment and Plan  Jittery Assessment  & Plan: Acute Most likely secondary to worsening anxiety, depression with increased stress. Will evaluate with labs for secondary causes.  Orders: -     VITAMIN D 25 Hydroxy (Vit-D Deficiency, Fractures) -     Comprehensive metabolic panel -     Vitamin B12 -     CBC with Differential/Platelet -     TSH -     T4, free -     T3, free -     Hemoglobin A1c  Mixed hyperlipidemia -     Lipid panel  Moderate episode of recurrent major depressive disorder (HCC) Assessment & Plan: Chronic, issues currently now more anxiety than depression.  See note regarding anxiety treatment.   Generalized anxiety disorder Assessment & Plan: Chronic, inadequate control  Start Lexapro 5 mg daily, she will call if tolerating well in 2 weeks for increase to 10 mg daily.  She will follow-up in 4 weeks.  Continue working on stress reduction and relaxation techniques.   Chronic insomnia Assessment & Plan: Chronic, inadequate control  Offered course of trazodone to treat but she would prefer to continue taking ZzzQuil for  now.  Symptoms may improve with initiation of Lexapro and improvement of generalized anxiety disorder.   Panic attacks Assessment & Plan: Chronic, use alprazolam low-dose as needed for panic attacks.  Encouraged her to limit use as much as able.   Other orders -     Escitalopram Oxalate; Take 1 tablet (5 mg total) by mouth daily.  Dispense: 30 tablet; Refill: 5    Return in about 4 weeks (around 11/13/2022) for follow up mood.   Eliezer Lofts, MD

## 2022-10-16 NOTE — Assessment & Plan Note (Signed)
Chronic, use alprazolam low-dose as needed for panic attacks.  Encouraged her to limit use as much as able.

## 2022-10-16 NOTE — Patient Instructions (Addendum)
Please stop at the lab to have labs drawn.  Start lexapro low dose 5 mg  Can use  alprazolam as needed, limit use.  Call if additional medication needed for sleep like trazodone.  Call in 2 week if interested in increasing dose to 10 mg if no side effects.

## 2022-10-16 NOTE — Assessment & Plan Note (Signed)
Chronic, inadequate control  Offered course of trazodone to treat but she would prefer to continue taking ZzzQuil for now.  Symptoms may improve with initiation of Lexapro and improvement of generalized anxiety disorder.

## 2022-10-16 NOTE — Assessment & Plan Note (Signed)
Chronic, issues currently now more anxiety than depression.  See note regarding anxiety treatment.

## 2022-10-16 NOTE — Assessment & Plan Note (Signed)
Acute Most likely secondary to worsening anxiety, depression with increased stress. Will evaluate with labs for secondary causes.

## 2022-10-16 NOTE — Assessment & Plan Note (Signed)
Chronic, inadequate control  Start Lexapro 5 mg daily, she will call if tolerating well in 2 weeks for increase to 10 mg daily.  She will follow-up in 4 weeks.  Continue working on stress reduction and relaxation techniques.

## 2022-10-18 ENCOUNTER — Emergency Department: Payer: 59

## 2022-10-18 ENCOUNTER — Encounter: Payer: Self-pay | Admitting: Emergency Medicine

## 2022-10-18 ENCOUNTER — Emergency Department
Admission: EM | Admit: 2022-10-18 | Discharge: 2022-10-18 | Disposition: A | Discharge status: Home / Self Care | Payer: 59 | Attending: Emergency Medicine | Admitting: Emergency Medicine

## 2022-10-18 DIAGNOSIS — F419 Anxiety disorder, unspecified: Secondary | ICD-10-CM | POA: Insufficient documentation

## 2022-10-18 DIAGNOSIS — R1031 Right lower quadrant pain: Secondary | ICD-10-CM | POA: Insufficient documentation

## 2022-10-18 DIAGNOSIS — R079 Chest pain, unspecified: Secondary | ICD-10-CM

## 2022-10-18 DIAGNOSIS — R0789 Other chest pain: Secondary | ICD-10-CM | POA: Insufficient documentation

## 2022-10-18 LAB — COMPREHENSIVE METABOLIC PANEL
ALT: 21 U/L (ref 0–44)
AST: 24 U/L (ref 15–41)
Albumin: 4.1 g/dL (ref 3.5–5.0)
Alkaline Phosphatase: 44 U/L (ref 38–126)
Anion gap: 9 (ref 5–15)
BUN: 13 mg/dL (ref 6–20)
CO2: 21 mmol/L — ABNORMAL LOW (ref 22–32)
Calcium: 9.6 mg/dL (ref 8.9–10.3)
Chloride: 109 mmol/L (ref 98–111)
Creatinine, Ser: 0.75 mg/dL (ref 0.44–1.00)
GFR, Estimated: >60 mL/min (ref >60)
Glucose, Bld: 91 mg/dL (ref 70–99)
Potassium: 3.7 mmol/L (ref 3.5–5.1)
Sodium: 139 mmol/L (ref 135–145)
Total Bilirubin: 0.4 mg/dL (ref 0.3–1.2)
Total Protein: 6.9 g/dL (ref 6.5–8.1)

## 2022-10-18 LAB — URINALYSIS, ROUTINE W REFLEX MICROSCOPIC
Bilirubin Urine: NEGATIVE
Glucose, UA: NEGATIVE mg/dL
Hgb urine dipstick: NEGATIVE
Ketones, ur: 5 mg/dL — AB
Leukocytes,Ua: NEGATIVE
Nitrite: NEGATIVE
Protein, ur: NEGATIVE mg/dL
Specific Gravity, Urine: 1.024 (ref 1.005–1.030)
pH: 5 (ref 5.0–8.0)

## 2022-10-18 LAB — CBC WITH DIFFERENTIAL/PLATELET
Abs Immature Granulocytes: 0.02 10*3/uL (ref 0.00–0.07)
Basophils Absolute: 0 10*3/uL (ref 0.0–0.1)
Basophils Relative: 1 %
Eosinophils Absolute: 0.1 10*3/uL (ref 0.0–0.5)
Eosinophils Relative: 2 %
HCT: 41.2 % (ref 36.0–46.0)
Hemoglobin: 13.8 g/dL (ref 12.0–15.0)
Immature Granulocytes: 0 %
Lymphocytes Relative: 52 %
Lymphs Abs: 3.5 10*3/uL (ref 0.7–4.0)
MCH: 31.1 pg (ref 26.0–34.0)
MCHC: 33.5 g/dL (ref 30.0–36.0)
MCV: 92.8 fL (ref 80.0–100.0)
Monocytes Absolute: 0.6 10*3/uL (ref 0.1–1.0)
Monocytes Relative: 8 %
Neutro Abs: 2.5 10*3/uL (ref 1.7–7.7)
Neutrophils Relative %: 37 %
Platelets: 294 10*3/uL (ref 150–400)
RBC: 4.44 MIL/uL (ref 3.87–5.11)
RDW: 12.9 % (ref 11.5–15.5)
WBC: 6.7 10*3/uL (ref 4.0–10.5)
nRBC: 0 % (ref 0.0–0.2)

## 2022-10-18 LAB — D-DIMER, QUANTITATIVE: D-Dimer, Quant: 0.38 ug/mL-FEU (ref 0.00–0.50)

## 2022-10-18 LAB — TROPONIN I (HIGH SENSITIVITY)
Troponin I (High Sensitivity): <2 ng/L (ref <18)
Troponin I (High Sensitivity): <2 ng/L (ref <18)

## 2022-10-18 LAB — LIPASE, BLOOD: Lipase: 47 U/L (ref 11–51)

## 2022-10-18 MED ORDER — IOHEXOL 350 MG/ML SOLN
100.0000 mL | Freq: Once | INTRAVENOUS | Status: AC | PRN
  Administered 2022-10-18: 100 mL via INTRAVENOUS

## 2022-10-18 MED ORDER — LORAZEPAM 2 MG/ML IJ SOLN
0.5000 mg | Freq: Once | INTRAMUSCULAR | Status: AC
  Administered 2022-10-18: 0.5 mg via INTRAVENOUS
  Filled 2022-10-18: qty 1

## 2022-10-18 MED ORDER — SODIUM CHLORIDE 0.9 % IV BOLUS
500.0000 mL | Freq: Once | INTRAVENOUS | Status: AC
  Administered 2022-10-18: 500 mL via INTRAVENOUS

## 2022-10-18 MED ORDER — KETOROLAC TROMETHAMINE 15 MG/ML IJ SOLN
15.0000 mg | Freq: Once | INTRAMUSCULAR | Status: AC
  Administered 2022-10-18: 15 mg via INTRAVENOUS
  Filled 2022-10-18: qty 1

## 2022-10-18 MED ORDER — IBUPROFEN 600 MG PO TABS: 600.0000 mg | ORAL_TABLET | Freq: Four times a day (QID) | ORAL | 0 refills | Status: AC | PRN

## 2022-10-18 MED ORDER — HYDROMORPHONE HCL 1 MG/ML IJ SOLN
0.5000 mg | Freq: Once | INTRAMUSCULAR | Status: AC
  Administered 2022-10-18: 0.5 mg via INTRAVENOUS
  Filled 2022-10-18: qty 0.5

## 2022-10-18 MED ORDER — DROPERIDOL 2.5 MG/ML IJ SOLN
1.2500 mg | Freq: Once | INTRAMUSCULAR | Status: AC
  Administered 2022-10-18: 1.25 mg via INTRAVENOUS
  Filled 2022-10-18: qty 2

## 2022-10-18 MED ORDER — ACETAMINOPHEN 500 MG PO TABS
1000.0000 mg | ORAL_TABLET | Freq: Once | ORAL | Status: AC
  Administered 2022-10-18: 1000 mg via ORAL
  Filled 2022-10-18: qty 2

## 2022-10-18 MED ORDER — LIDOCAINE 5 % EX PTCH
1.0000 | MEDICATED_PATCH | CUTANEOUS | Status: DC
  Administered 2022-10-18: 1 via TRANSDERMAL
  Filled 2022-10-18: qty 1

## 2022-10-18 MED ORDER — LIDOCAINE 5 % EX PTCH: 1.0000 | MEDICATED_PATCH | Freq: Two times a day (BID) | CUTANEOUS | 0 refills | Status: AC

## 2022-10-18 NOTE — ED Notes (Signed)
Pt c/o 9.5 back pain and chest pain.  "They already did the chest X ray". Pt observed rocking back and forth requesting Dilaudid for pain. MD aware.

## 2022-10-18 NOTE — Discharge Instructions (Addendum)
Start taking your Lexapro and follow up with mental health team-  Workup for your heart was normal.  Return to ER for worsening symptoms or any other concerns. Take tylenol 1g every 8 hours with the ibuprofen with food and the lidocaine patches.   IMPRESSION: 1. No acute findings are noted in the chest, abdomen or pelvis to account for the patient's symptoms. Specifically, no evidence of acute aortic syndrome. 2. Mild aortic atherosclerosis.

## 2022-10-18 NOTE — ED Notes (Signed)
Transported to CT 

## 2022-10-18 NOTE — ED Triage Notes (Signed)
Pt to ED via POV with c/o panic attack and sudden onset substernal CP. Pt arrives to ED tearful, and hyperventilating. Pt c/o feeling dizzy and light headed. RA sats on arrival are 98%. Pt states substernal CP 9/10 on arrival.
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
## 2022-10-18 NOTE — ED Provider Notes (Signed)
Procedures     ----------------------------------------- 8:35 AM on 10/18/2022 -----------------------------------------  Feeling much better.  Patient is calm, comfortable with discharge home.    Carrie Mew, MD 10/18/22 [phone]

## 2022-10-20 ENCOUNTER — Telehealth: Payer: Self-pay

## 2022-10-20 ENCOUNTER — Encounter: Payer: Self-pay | Admitting: Family Medicine

## 2022-10-20 NOTE — Transitions of Care (Post Inpatient/ED Visit) (Signed)
   10/20/2022  Name: Rebecca Allen MRN: [id number] DOB: Mar 03, 1966  Today's TOC FU Call Status: Today's TOC FU Call Status:: Successful TOC FU Call Competed TOC FU Call Complete Date: 10/20/22  Transition Care Management Follow-up Telephone Call Date of Discharge: 10/18/22 Discharge Facility: [facility]) Type of Discharge: Emergency Department Reason for ED Visit: Mental or Mood Disorder Mental or Mood Disorder Diagnosis: Anxiety How have you been since you were released from the hospital?: Better Any questions or concerns?: No  Items Reviewed: Did you receive and understand the discharge instructions provided?: No Medications obtained and verified?: Yes (Medications Reviewed) Any new allergies since your discharge?: No Dietary orders reviewed?: NA Do you have support at home?: Yes People in Home: spouse Name of Support/Comfort Primary Source: [facility] and Equipment/Supplies: August Ordered?: NA Any new equipment or medical supplies ordered?: NA  Functional Questionnaire: Do you need assistance with bathing/showering or dressing?: No Do you need assistance with meal preparation?: No Do you need assistance with eating?: No Do you have difficulty maintaining continence: No Do you need assistance with getting out of bed/getting out of a chair/moving?: No Do you have difficulty managing or taking your medications?: No  Folllow up appointments reviewed: PCP Follow-up appointment confirmed?: No (Pt declined) MD Provider Line Number:[phone] Given: Yes [facility] Follow-up appointment confirmed?: NA Do you need transportation to your follow-up appointment?: No Do you understand care options if your condition(s) worsen?: Yes-patient verbalized understanding    [place], CMA

## 2022-11-03 ENCOUNTER — Ambulatory Visit (INDEPENDENT_AMBULATORY_CARE_PROVIDER_SITE_OTHER): Payer: 59 | Admitting: Family Medicine

## 2022-11-03 VITALS — BP 120/78 | HR 67 | Temp 97.4°F | Ht 65.0 in | Wt 164.1 lb

## 2022-11-03 DIAGNOSIS — E782 Mixed hyperlipidemia: Secondary | ICD-10-CM

## 2022-11-03 DIAGNOSIS — F411 Generalized anxiety disorder: Secondary | ICD-10-CM | POA: Diagnosis not present

## 2022-11-03 DIAGNOSIS — F331 Major depressive disorder, recurrent, moderate: Secondary | ICD-10-CM

## 2022-11-03 DIAGNOSIS — Z111 Encounter for screening for respiratory tuberculosis: Secondary | ICD-10-CM | POA: Diagnosis not present

## 2022-11-03 MED ORDER — ESCITALOPRAM OXALATE 10 MG PO TABS: 10.0000 mg | ORAL_TABLET | Freq: Every day | ORAL | 3 refills | Status: DC

## 2022-11-03 MED ORDER — ALPRAZOLAM 0.5 MG PO TABS: 0.5000 mg | ORAL_TABLET | Freq: Every day | ORAL | 0 refills | Status: DC | PRN

## 2022-11-03 NOTE — Patient Instructions (Addendum)
Please stop at the lab to have labs drawn.  Ic

## 2022-11-03 NOTE — Assessment & Plan Note (Signed)
Chronic, inadequate control, slight improvement with initiation of Lexapro.  No side effects.  Increase Lexapro to 10 mg p.o. daily.  Use alprazolam sparingly as needed.  Decrease use once Lexapro starts increasing mood benefit.  She will call/Mychart for follow-up in approximately 1 month to update me on how she is doing with the Lexapro.

## 2022-11-03 NOTE — Progress Notes (Signed)
Patient ID: Rebecca Allen, female    DOB: 03-29-1966, 57 y.o.   MRN: [id number]  This visit was conducted in person.  BP 120/78   Pulse 67   Temp (!) 97.4 F (36.3 C) (Temporal)   Ht 5\' 5"  (1.651 m)   Wt 164 lb 2 oz (74.4 kg)   SpO2 94%   BMI 27.31 kg/m    CC:  Chief Complaint  Patient presents with   Health Assessment for Work   Depression    Has follow up next week but was hoping to do that today while she is here.     Subjective:   HPI: Rebecca Allen is a 57 y.o. female presenting on 11/03/2022 for Health Assessment for Work and Depression (Has follow up next week but was hoping to do that today while she is here. )  1 month follow-up on her generalized anxiety, panic disorder and moderate recurrent major depressive disorder: At last office visit March 2024 started on Lexapro 5 mg daily with plan to increase.  She reports continued daily anxiety and depressive symptoms.  She has noted  10-15% improvement.  No SE.   Using alprazolam 2 times weekly.     11/03/2022   11:11 AM 10/16/2022   10:51 AM 04/24/2022    4:36 PM 11/18/2021    2:13 PM  GAD 7 : Generalized Anxiety Score  Nervous, Anxious, on Edge 3 3 2 3   Control/stop worrying 2 3 2 3   Worry too much - different things 2 3 2 3   Trouble relaxing 2 2 1 3   Restless 2 2 1 2   Easily annoyed or irritable 2 2 2 3   Afraid - awful might happen 2 2 2 3   Total GAD 7 Score 15 17 12 20   Anxiety Difficulty Very difficult Extremely difficult Very difficult Extremely difficult      11/03/2022   11:10 AM 10/16/2022   10:50 AM 09/03/2022   11:37 AM  Depression screen PHQ 2/9  Decreased Interest 2 1 0  Down, Depressed, Hopeless 2 3 1   PHQ - 2 Score 4 4 1   Altered sleeping 3 3 1   Tired, decreased energy 3 2 3   Change in appetite 1 1 0  Feeling bad or failure about yourself  2 1 0  Trouble concentrating 0 0 0  Moving slowly or fidgety/restless 0 0 0  Suicidal thoughts 0 0 0  PHQ-9 Score 13 11 5   Difficult  doing work/chores Very difficult Very difficult Somewhat difficult     She also has administrative form for completion for work.  She needs a TB test performed.    The 10-year ASCVD risk score (Arnett DK, et al., 2019) is: 5.5%   Values used to calculate the score:     Age: 42 years     Sex: Female     Is Non-Hispanic African American: No     Diabetic: No     Tobacco smoker: Yes     Systolic Blood Pressure: 123456 mmHg     Is BP treated: No     HDL Cholesterol: 52.3 mg/dL     Total Cholesterol: 245 mg/dL  Relevant past medical, surgical, family and social history reviewed and updated as indicated. Interim medical history since our last visit reviewed. Allergies and medications reviewed and updated. Outpatient Medications Prior to Visit  Medication Sig Dispense Refill   aspirin 81 MG chewable tablet Chew 81 mg by mouth daily.     betamethasone  dipropionate 0.05 % cream Apply topically 2 (two) times daily. 30 g 0   calcium carbonate (TUMS - DOSED IN MG ELEMENTAL CALCIUM) 500 MG chewable tablet Chew 6 tablets by mouth daily as needed for indigestion or heartburn.     estradiol (VIVELLE-DOT) 0.05 MG/24HR patch PLACE 1 PATCH TRANS-DERMALLY TWICE A WEEK. REMOVE OLD PATCH BEFORE APPLYING NEW PATCH. 24 patch 3   pantoprazole (PROTONIX) 40 MG tablet TAKE 1 TABLET BY MOUTH EVERY DAY 90 tablet 1   Varenicline Tartrate, Starter, (CHANTIX STARTING MONTH PAK) 0.5 MG X 11 & 1 MG X 42 TBPK Take one 0.5 mg tab daily for 3 days,then inc. to one 0.5 mg tab BID x 4 days, then inc. to one 1 mg tab BID. 53 each 0   ALPRAZolam (XANAX) 0.5 MG tablet Take 1 tablet (0.5 mg total) by mouth daily as needed for anxiety. 20 tablet 0   escitalopram (LEXAPRO) 5 MG tablet Take 1 tablet (5 mg total) by mouth daily. 30 tablet 5   No facility-administered medications prior to visit.     Per HPI unless specifically indicated in ROS section below Review of Systems  Constitutional:  Negative for fatigue, fever and  unexpected weight change.  HENT:  Negative for congestion, ear pain, sinus pressure, sneezing, sore throat and trouble swallowing.   Eyes:  Negative for pain and itching.  Respiratory:  Negative for cough, shortness of breath and wheezing.   Cardiovascular:  Negative for chest pain, palpitations and leg swelling.  Gastrointestinal:  Negative for abdominal pain, blood in stool, constipation, diarrhea and nausea.  Genitourinary:  Negative for difficulty urinating, dysuria, hematuria, menstrual problem and vaginal discharge.  Skin:  Negative for rash.  Neurological:  Negative for syncope, weakness, light-headedness, numbness and headaches.  Psychiatric/Behavioral:  Positive for agitation and dysphoric mood. Negative for confusion. The patient is not nervous/anxious.    Objective:  BP 120/78   Pulse 67   Temp (!) 97.4 F (36.3 C) (Temporal)   Ht 5\' 5"  (1.651 m)   Wt 164 lb 2 oz (74.4 kg)   SpO2 94%   BMI 27.31 kg/m   Wt Readings from Last 3 Encounters:  11/03/22 164 lb 2 oz (74.4 kg)  10/18/22 163 lb 4 oz (74 kg)  10/16/22 163 lb 4 oz (74 kg)      Physical Exam Constitutional:      General: She is not in acute distress.    Appearance: Normal appearance. She is well-developed. She is not ill-appearing or toxic-appearing.  HENT:     Head: Normocephalic.     Right Ear: Hearing, tympanic membrane, ear canal and external ear normal. Tympanic membrane is not erythematous, retracted or bulging.     Left Ear: Hearing, tympanic membrane, ear canal and external ear normal. Tympanic membrane is not erythematous, retracted or bulging.     Nose: No mucosal edema or rhinorrhea.     Right Sinus: No maxillary sinus tenderness or frontal sinus tenderness.     Left Sinus: No maxillary sinus tenderness or frontal sinus tenderness.     Mouth/Throat:     Pharynx: Uvula midline.  Eyes:     General: Lids are normal. Lids are everted, no foreign bodies appreciated.     Conjunctiva/sclera: Conjunctivae  normal.     Pupils: Pupils are equal, round, and reactive to light.  Neck:     Thyroid: No thyroid mass or thyromegaly.     Vascular: No carotid bruit.     Trachea: Trachea  normal.  Cardiovascular:     Rate and Rhythm: Normal rate and regular rhythm.     Pulses: Normal pulses.     Heart sounds: Normal heart sounds, S1 normal and S2 normal. No murmur heard.    No friction rub. No gallop.  Pulmonary:     Effort: Pulmonary effort is normal. No tachypnea or respiratory distress.     Breath sounds: Normal breath sounds. No decreased breath sounds, wheezing, rhonchi or rales.  Abdominal:     General: Bowel sounds are normal.     Palpations: Abdomen is soft.     Tenderness: There is no abdominal tenderness.  Musculoskeletal:     Cervical back: Normal range of motion and neck supple.  Skin:    General: Skin is warm and dry.     Findings: No rash.  Neurological:     Mental Status: She is alert.  Psychiatric:        Mood and Affect: Mood is not anxious or depressed.        Speech: Speech normal.        Behavior: Behavior normal. Behavior is cooperative.        Thought Content: Thought content normal.        Judgment: Judgment normal.       Results for orders placed or performed during the hospital encounter of 10/18/22  Comprehensive metabolic panel  Result Value Ref Range   Sodium 139 135 - 145 mmol/L   Potassium 3.7 3.5 - 5.1 mmol/L   Chloride 109 98 - 111 mmol/L   CO2 21 (L) 22 - 32 mmol/L   Glucose, Bld 91 70 - 99 mg/dL   BUN 13 6 - 20 mg/dL   Creatinine, Ser 0.75 0.44 - 1.00 mg/dL   Calcium 9.6 8.9 - 10.3 mg/dL   Total Protein 6.9 6.5 - 8.1 g/dL   Albumin 4.1 3.5 - 5.0 g/dL   AST 24 15 - 41 U/L   ALT 21 0 - 44 U/L   Alkaline Phosphatase 44 38 - 126 U/L   Total Bilirubin 0.4 0.3 - 1.2 mg/dL   GFR, Estimated >60 >60 mL/min   Anion gap 9 5 - 15  Lipase, blood  Result Value Ref Range   Lipase 47 11 - 51 U/L  CBC with Differential  Result Value Ref Range   WBC 6.7 4.0  - 10.5 K/uL   RBC 4.44 3.87 - 5.11 MIL/uL   Hemoglobin 13.8 12.0 - 15.0 g/dL   HCT 41.2 36.0 - 46.0 %   MCV 92.8 80.0 - 100.0 fL   MCH 31.1 26.0 - 34.0 pg   MCHC 33.5 30.0 - 36.0 g/dL   RDW 12.9 11.5 - 15.5 %   Platelets 294 150 - 400 K/uL   nRBC 0.0 0.0 - 0.2 %   Neutrophils Relative % 37 %   Neutro Abs 2.5 1.7 - 7.7 K/uL   Lymphocytes Relative 52 %   Lymphs Abs 3.5 0.7 - 4.0 K/uL   Monocytes Relative 8 %   Monocytes Absolute 0.6 0.1 - 1.0 K/uL   Eosinophils Relative 2 %   Eosinophils Absolute 0.1 0.0 - 0.5 K/uL   Basophils Relative 1 %   Basophils Absolute 0.0 0.0 - 0.1 K/uL   Immature Granulocytes 0 %   Abs Immature Granulocytes 0.02 0.00 - 0.07 K/uL  D-dimer, quantitative  Result Value Ref Range   D-Dimer, Quant 0.38 0.00 - 0.50 ug/mL-FEU  Urinalysis, Routine w reflex microscopic -Urine, Clean Catch  Result Value Ref Range   Color, Urine YELLOW (A) YELLOW   APPearance HAZY (A) CLEAR   Specific Gravity, Urine 1.024 1.005 - 1.030   pH 5.0 5.0 - 8.0   Glucose, UA NEGATIVE NEGATIVE mg/dL   Hgb urine dipstick NEGATIVE NEGATIVE   Bilirubin Urine NEGATIVE NEGATIVE   Ketones, ur 5 (A) NEGATIVE mg/dL   Protein, ur NEGATIVE NEGATIVE mg/dL   Nitrite NEGATIVE NEGATIVE   Leukocytes,Ua NEGATIVE NEGATIVE  Troponin I (High Sensitivity)  Result Value Ref Range   Troponin I (High Sensitivity) <2 <18 ng/L  Troponin I (High Sensitivity)  Result Value Ref Range   Troponin I (High Sensitivity) <2 <18 ng/L    Assessment and Plan  Screening for tuberculosis -     QuantiFERON-TB Gold Plus  Mixed hyperlipidemia Assessment & Plan: Chronic Above goal.  Counseled patient to quit smoking and work on low cholesterol diet. Continue omega-3 fatty acids and consider red yeast rice. The 10-year ASCVD risk score (Arnett DK, et al., 2019) is: 5.5%   Values used to calculate the score:     Age: 48 years     Sex: Female     Is Non-Hispanic African American: No     Diabetic: No     Tobacco  smoker: Yes     Systolic Blood Pressure: 123456 mmHg     Is BP treated: No     HDL Cholesterol: 52.3 mg/dL     Total Cholesterol: 245 mg/dL    Moderate episode of recurrent major depressive disorder (HCC) Assessment & Plan: Chronic, inadequate control  Increase Lexapro to 10 mg p.o. daily.  Work on stress reduction and relaxation techniques.   Generalized anxiety disorder Assessment & Plan: Chronic, inadequate control, slight improvement with initiation of Lexapro.  No side effects.  Increase Lexapro to 10 mg p.o. daily.  Use alprazolam sparingly as needed.  Decrease use once Lexapro starts increasing mood benefit.  She will call/Mychart for follow-up in approximately 1 month to update me on how she is doing with the Lexapro.   Other orders -     Escitalopram Oxalate; Take 1 tablet (10 mg total) by mouth daily.  Dispense: 30 tablet; Refill: 3 -     ALPRAZolam; Take 1 tablet (0.5 mg total) by mouth daily as needed for anxiety.  Dispense: 20 tablet; Refill: 0    Return if symptoms worsen or fail to improve.   Eliezer Lofts, MD

## 2022-11-03 NOTE — Assessment & Plan Note (Signed)
Chronic Above goal.  Counseled patient to quit smoking and work on low cholesterol diet. Continue omega-3 fatty acids and consider red yeast rice. The 10-year ASCVD risk score (Arnett DK, et al., 2019) is: 5.5%   Values used to calculate the score:     Age: 57 years     Sex: Female     Is Non-Hispanic African American: No     Diabetic: No     Tobacco smoker: Yes     Systolic Blood Pressure: 123456 mmHg     Is BP treated: No     HDL Cholesterol: 52.3 mg/dL     Total Cholesterol: 245 mg/dL

## 2022-11-03 NOTE — Assessment & Plan Note (Signed)
Chronic, inadequate control  Increase Lexapro to 10 mg p.o. daily.  Work on stress reduction and relaxation techniques.

## 2022-11-05 LAB — QUANTIFERON-TB GOLD PLUS
Mitogen-NIL: >10 IU/mL
NIL: 0.06 IU/mL
QuantiFERON-TB Gold Plus: NEGATIVE
TB1-NIL: 0.01 IU/mL
TB2-NIL: 0.01 IU/mL

## 2022-11-27 ENCOUNTER — Other Ambulatory Visit: Payer: Self-pay | Admitting: Family Medicine

## 2022-12-21 ENCOUNTER — Emergency Department: Payer: 59

## 2022-12-21 ENCOUNTER — Other Ambulatory Visit: Payer: Self-pay

## 2022-12-21 ENCOUNTER — Emergency Department
Admission: EM | Admit: 2022-12-21 | Discharge: 2022-12-21 | Disposition: A | Discharge status: Home / Self Care | Payer: 59 | Attending: Emergency Medicine | Admitting: Emergency Medicine

## 2022-12-21 DIAGNOSIS — R079 Chest pain, unspecified: Secondary | ICD-10-CM | POA: Insufficient documentation

## 2022-12-21 LAB — HEPATIC FUNCTION PANEL
ALT: 28 U/L (ref 0–44)
AST: 39 U/L (ref 15–41)
Albumin: 4.3 g/dL (ref 3.5–5.0)
Alkaline Phosphatase: 50 U/L (ref 38–126)
Bilirubin, Direct: <0.1 mg/dL (ref 0.0–0.2)
Total Bilirubin: 0.6 mg/dL (ref 0.3–1.2)
Total Protein: 6.7 g/dL (ref 6.5–8.1)

## 2022-12-21 LAB — CBC
HCT: 42.6 % (ref 36.0–46.0)
Hemoglobin: 14.2 g/dL (ref 12.0–15.0)
MCH: 30.6 pg (ref 26.0–34.0)
MCHC: 33.3 g/dL (ref 30.0–36.0)
MCV: 91.8 fL (ref 80.0–100.0)
Platelets: 272 10*3/uL (ref 150–400)
RBC: 4.64 MIL/uL (ref 3.87–5.11)
RDW: 13 % (ref 11.5–15.5)
WBC: 5.7 10*3/uL (ref 4.0–10.5)
nRBC: 0 % (ref 0.0–0.2)

## 2022-12-21 LAB — BASIC METABOLIC PANEL
Anion gap: 10 (ref 5–15)
BUN: 16 mg/dL (ref 6–20)
CO2: 23 mmol/L (ref 22–32)
Calcium: 9.1 mg/dL (ref 8.9–10.3)
Chloride: 105 mmol/L (ref 98–111)
Creatinine, Ser: 0.73 mg/dL (ref 0.44–1.00)
GFR, Estimated: >60 mL/min (ref >60)
Glucose, Bld: 105 mg/dL — ABNORMAL HIGH (ref 70–99)
Potassium: 3.8 mmol/L (ref 3.5–5.1)
Sodium: 138 mmol/L (ref 135–145)

## 2022-12-21 LAB — LIPASE, BLOOD: Lipase: 36 U/L (ref 11–51)

## 2022-12-21 LAB — TROPONIN I (HIGH SENSITIVITY)
Troponin I (High Sensitivity): 3 ng/L (ref <18)
Troponin I (High Sensitivity): <2 ng/L (ref <18)

## 2022-12-21 MED ORDER — HYDROMORPHONE HCL 1 MG/ML IJ SOLN
1.0000 mg | Freq: Once | INTRAMUSCULAR | Status: AC
  Administered 2022-12-21: 1 mg via INTRAVENOUS
  Filled 2022-12-21: qty 1

## 2022-12-21 MED ORDER — IOHEXOL 350 MG/ML SOLN
80.0000 mL | Freq: Once | INTRAVENOUS | Status: AC | PRN
  Administered 2022-12-21: 80 mL via INTRAVENOUS

## 2022-12-21 MED ORDER — ONDANSETRON 4 MG PO TBDP
4.0000 mg | ORAL_TABLET | Freq: Once | ORAL | Status: AC
  Administered 2022-12-21: 4 mg via ORAL
  Filled 2022-12-21: qty 1

## 2022-12-21 NOTE — ED Notes (Signed)
Pt requesting nausea meds.

## 2022-12-21 NOTE — ED Provider Notes (Signed)
[facility] Provider Note    Event Date/Time   First MD Initiated Contact with Patient 12/21/22 1401     (approximate)   History   Chest Pain   HPI  Rebecca Allen is a 57 y.o. female past medical history of hyperlipidemia, GERD, anxiety, depression who presents with chest pain.  Pain started about 2 hours prior to arrival and patient was laying in bed.  Describes as something sitting on her chest.  Associated with nausea and shortness of breath.  Has been constant since onset.  Not exertional.  It has started to radiate around the left chest to her back.  Patient denies history of similar pain.  Says she has had episodes of chest pain associate with anxiety in the past but this feels different.  Denies history of cardiac issues or hypertension.  Denies drug or alcohol use.  She does have some associated upper abdominal pain     Past Medical History:  Diagnosis Date   Anxiety    Depression    GERD (gastroesophageal reflux disease)    Grief at loss of child 2022   History of kidney stones    Hyperlipidemia    Kidney stone    Pneumonia    Treadmill stress test negative for angina pectoris 12/18/2010    Patient Active Problem List   Diagnosis Date Noted   Hormone replacement therapy (HRT) 06/04/2022   Dyshidrotic eczema 04/24/2022   Smoker 01/27/2022   Grieving 06/24/2021   Hot flashes due to surgical menopause 05/06/2021   Bilateral hand pain 04/22/2021   Cervical lymphadenopathy 02/27/2021   Blurred vision, bilateral 10/08/2020   Right wrist pain 10/08/2020   Hematuria 06/18/2020   Hematoma 05/13/2020   Post-COVID chronic cough 11/15/2018   Allergic sinusitis 11/11/2018   Chronic insomnia 10/18/2018   Symptomatic mammary hypertrophy 09/30/2018   Current moderate episode of major depressive disorder (HCC) 09/06/2018   Gastritis 01/10/2018   Palpitations 05/25/2017   Chronic cervical radiculopathy 07/07/2016   Lymphadenopathy  02/07/2016   Post-traumatic stress 10/05/2014   Panic attacks 07/25/2013   Chronic low back pain with left-sided sciatica 05/30/2010   Generalized anxiety disorder 05/16/2009   Fatigue 05/01/2009   PERS HX TOBACCO USE PRESENTING HAZARDS HEALTH 05/07/2008   Mixed hyperlipidemia 03/28/2008   COMMON MIGRAINE 03/28/2008   GERD 03/28/2008   COSTOCHONDRITIS, RECURRENT 03/28/2008   Atypical chest pain 03/28/2008     Physical Exam  Triage Vital Signs: ED Triage Vitals  Enc Vitals Group     BP 12/21/22 1251 131/73     Pulse Rate 12/21/22 1251 71     Resp 12/21/22 1251 20     Temp 12/21/22 1253 98.4 F (36.9 C)     Temp Source 12/21/22 1251 Oral     SpO2 12/21/22 1251 96 %     Weight 12/21/22 1248 162 lb (73.5 kg)     Height 12/21/22 1248 5\' 5"  (1.651 m)     Head Circumference --      Peak Flow --      Pain Score 12/21/22 1252 10     Pain Loc --      Pain Edu? --      Excl. in GC? --     Most recent vital signs: Vitals:   12/21/22 1251 12/21/22 1253  BP: 131/73   Pulse: 71   Resp: 20   Temp:  98.4 F (36.9 C)  SpO2: 96%      General: Awake, patient  is tearful CV:  Good peripheral perfusion.  Resp:  Normal effort.  Abd:  No distention.  Mild tenderness palpation epigastric region no guarding Neuro:             Awake, Alert, Oriented x 3  Other:  2+ DP and radial pulses bilateral upper and lower extremities   ED Results / Procedures / Treatments  Labs (all labs ordered are listed, but only abnormal results are displayed) Labs Reviewed  BASIC METABOLIC PANEL - Abnormal; Notable for the following components:      Result Value   Glucose, Bld 105 (*)    All other components within normal limits  CBC  HEPATIC FUNCTION PANEL  LIPASE, BLOOD  TROPONIN I (HIGH SENSITIVITY)  TROPONIN I (HIGH SENSITIVITY)     EKG  EKG interpretation performed by myself: NSR, nml axis, nml intervals, no acute ischemic changes    RADIOLOGY I reviewed and interpreted the CXR which  does not show any acute cardiopulmonary process    PROCEDURES:  Critical Care performed: No  Procedures  The patient is on the cardiac monitor to evaluate for evidence of arrhythmia and/or significant heart rate changes.   MEDICATIONS ORDERED IN ED: Medications  HYDROmorphone (DILAUDID) injection 1 mg (has no administration in time range)  ondansetron (ZOFRAN-ODT) disintegrating tablet 4 mg (4 mg Oral Given 12/21/22 1326)     IMPRESSION / MDM / ASSESSMENT AND PLAN / ED COURSE  I reviewed the triage vital signs and the nursing notes.                              [name] presentation is most consistent with acute presentation with potential threat to life or bodily function.  Differential diagnosis includes, but is not limited to, musculoskeletal pain, GI related pain including reflux, ulcer, pancreatitis, biliary pathology, PE, aortic dissection, ACS  Patient is a 57 year old female who presents with chest pain that started acutely about 2 hours prior to arrival.  Describes as something sitting on her chest that radiates to her back.  Does have associated nausea and shortness of breath.  Patient has been seen in the ER in the past for episodes of chest pain but says that those felt very different more associated with her anxiety. [name] vital signs are reassuring.  Exam notable for some mild tenderness in the epigastric region.  Cardiopulmonary exam is reassuring she has symmetric pulses.  [name] EKG is nonischemic.  Chest x-ray is normal.  Troponin is negative.  I also added on hepatic function panel and lipase given her abdominal tenderness suspect this could be referred pain from GI pathology. Given severity of pain with radiation to the back did order CT angio of the chest abdomen pelvis.  I did review patient's last ED visit 2 months ago and patient had a CT angio at this time as well however she is telling me that pain feels significantly different today so we will  proceed with imaging.  Will give Dilaudid for pain.  Signed out to oncoming provider disposition pending imaging repeat troponin and reassessment.         FINAL CLINICAL IMPRESSION(S) / ED DIAGNOSES   Final diagnoses:  Chest pain, unspecified type     Rx / DC Orders   ED Discharge Orders     None        Note:  This document was prepared using Dragon voice recognition software and may include unintentional dictation errors.  Georga Hacking, MD 12/21/22 [phone]

## 2022-12-21 NOTE — ED Notes (Signed)
Called lab to add on Hepatic function and Lipase.

## 2022-12-21 NOTE — ED Triage Notes (Signed)
Pt to ED for sudden onset central chest pressure that radiates down L arm "like an elephant sitting on my chest" since 30 minutes ago. States heart has been fluttering on and off last 2 weeks. Pt took 4 chewable aspirin PTA.  First nurse doing EKG. Denies SOB. Endorses nausea and dizziness. Skin is dry. Pt appears anxious and weepy in triage.

## 2022-12-21 NOTE — ED Notes (Signed)
Pt states daughter was married this past weekend and that she did everything. Pt states she is on lexapro for anxiety and takes xanax as an adjunct. Pt states the last dose was over a day ago for the xanax. Pt is very tearful.

## 2022-12-21 NOTE — ED Provider Notes (Signed)
-----------------------------------------   3:09 PM on 12/21/2022 -----------------------------------------  Blood pressure 131/73, pulse 71, temperature 98.4 F (36.9 C), temperature source Oral, resp. rate 20, height 5\' 5"  (1.651 m), weight 73.5 kg, SpO2 96 %.  Assuming care from Dr. Sidney Ace.  In short, Rebecca Allen is a 57 y.o. female with a chief complaint of Chest Pain .  Refer to the original H&P for additional details.  The current plan of care is to follow-up CTA and repeat troponin.  ----------------------------------------- 4:36 PM on 12/21/2022 ----------------------------------------- Repeat troponin within normal limits and CTA of chest and abdomen is negative for acute process, no evidence of dissection.  On reassessment, patient reports that pain is improved if she stretches her arms upward and I suspect musculoskeletal etiology.  She is appropriate for discharge home with PCP follow-up, was counseled to return to the ED for new or worsening symptoms.  Patient agrees with plan.    Chesley Noon, MD 12/21/22 [phone]

## 2022-12-22 ENCOUNTER — Telehealth: Payer: Self-pay

## 2022-12-22 NOTE — Transitions of Care (Post Inpatient/ED Visit) (Signed)
   12/22/2022  Name: Rebecca Allen MRN: [id number] DOB: [birth year]  Today's TOC FU Call Status: Today's TOC FU Call Status:: Unsuccessul Call (1st Attempt) Unsuccessful Call (1st Attempt) Date: 12/22/22  Attempted to reach the patient regarding the most recent Inpatient/ED visit.  Follow Up Plan: Additional outreach attempts will be made to reach the patient to complete the Transitions of Care (Post Inpatient/ED visit) call.   Signature   Agnes Lawrence, CMA (AAMA)  [facility] [phone]

## 2022-12-24 NOTE — Transitions of Care (Post Inpatient/ED Visit) (Signed)
I spoke with pt; pt said ED said chest pain thought may have been related to fall on 12/20/22; pt hit her mid chest when fell. pt said still having sharp pain in mid chest that shoots around to mid back. tylenol and heat helps pain. Offered pt appt with Dr Ermalene Searing and pt said she was going to give it one more day and if still having pain in morning pt will call and schedule appt with Dr Ermalene Searing. UC & ED precautions given and pt voiced understanding. Sending note to Dr Ermalene Searing.     12/24/2022  Name: Rebecca Allen MRN: [id number] DOB: Aug 01, 1966  Today's TOC FU Call Status: Today's TOC FU Call Status:: Successful TOC FU Call Competed Unsuccessful Call (1st Attempt) Date: 12/22/22 [facility] FU Call Complete Date: 12/24/22  Transition Care Management Follow-up Telephone Call Date of Discharge: 12/21/22 Discharge Facility: [facility]) Type of Discharge: Emergency Department Reason for ED Visit: Cardiac Conditions (chest pain thought may have been related to fall on 12/20/22; pt hit her mid chest when fell. pt said still having sharp pain in mid chest that shoots around to mid back. tylenol and heat helps pain.) Cardiac Conditions Diagnosis:  (see note above) How have you been since you were released from the hospital?: Better Any questions or concerns?: No  Items Reviewed: Did you receive and understand the discharge instructions provided?: Yes Medications obtained,verified, and reconciled?: Yes (Medications Reviewed) (presently just taking tylenol.) Any new allergies since your discharge?: No Dietary orders reviewed?: NA Do you have support at home?: Yes People in Home: spouse Name of Support/Comfort Primary Source: [name]  Medications Reviewed Today: Medications Reviewed Today     Reviewed by Donnamarie Poag, CMA (Certified Medical Assistant) on 10/20/22 at 1136  Med List Status: <None>   Medication Order Taking? Sig Documenting Provider Last Dose  Status Informant  ALPRAZolam (XANAX) 0.5 MG tablet [id number] Yes Take 1 tablet (0.5 mg total) by mouth daily as needed for anxiety. Excell Seltzer, MD Taking Active   aspirin 81 MG chewable tablet [id number] Yes Chew 81 mg by mouth daily. [provider] Taking Active   betamethasone dipropionate 0.05 % cream [id number] Yes Apply topically 2 (two) times daily. Excell Seltzer, MD Taking Active   calcium carbonate (TUMS - DOSED IN MG ELEMENTAL CALCIUM) 500 MG chewable tablet [id number] Yes Chew 6 tablets by mouth daily as needed for indigestion or heartburn. [provider] Taking Active   escitalopram (LEXAPRO) 5 MG tablet [id number] Yes Take 1 tablet (5 mg total) by mouth daily. Excell Seltzer, MD Taking Active   estradiol (VIVELLE-DOT) 0.05 MG/24HR patch [id number] Yes PLACE 1 PATCH TRANS-DERMALLY TWICE A WEEK. REMOVE OLD PATCH BEFORE APPLYING NEW PATCH. Excell Seltzer, MD Taking Active   ibuprofen (ADVIL) 600 MG tablet [id number] Yes Take 1 tablet (600 mg total) by mouth every 6 (six) hours as needed for up to 7 days. Concha Se, MD Taking Active   lidocaine (LIDODERM) 5 % [id number] Yes Place 1 patch onto the skin every 12 (twelve) hours for 5 days. Remove & Discard patch within 12 hours or as directed by MD and leave off 12 hours before placing new patch Concha Se, MD Taking Active   pantoprazole (PROTONIX) 40 MG tablet [id number] Yes TAKE 1 TABLET BY MOUTH EVERY DAY Bedsole, Amy E, MD Taking Active   Varenicline Tartrate, Starter, (CHANTIX STARTING MONTH PAK) 0.5 MG X 11 & 1 MG X 42 TBPK [id number]  Yes Take one 0.5 mg tab daily for 3 days,then inc. to one 0.5 mg tab BID x 4 days, then inc. to one 1 mg tab BID. Excell Seltzer, MD Taking Active             Home Care and Equipment/Supplies: Were Home Health Services Ordered?: NA Any new equipment or medical supplies ordered?: NA  Functional Questionnaire: Do you need assistance with bathing/showering or dressing?: No Do you  need assistance with meal preparation?: No Do you need assistance with eating?: No Do you have difficulty maintaining continence: No Do you need assistance with getting out of bed/getting out of a chair/moving?: No Do you have difficulty managing or taking your medications?: No  Follow up appointments reviewed: PCP Follow-up appointment confirmed?: No MD Provider Line [phone] Given: Yes Specialist Hospital Follow-up appointment confirmed?: NA Do you need transportation to your follow-up appointment?: No Do you understand care options if your condition(s) worsen?: Yes-patient verbalized understanding    SIGNATURE Lewanda Rife, LPN

## 2022-12-25 ENCOUNTER — Encounter: Payer: Self-pay | Admitting: Family Medicine

## 2022-12-25 ENCOUNTER — Ambulatory Visit: Payer: 59 | Admitting: Family Medicine

## 2022-12-25 VITALS — BP 110/80 | HR 77 | Temp 98.2°F | Ht 65.0 in | Wt 163.1 lb

## 2022-12-25 DIAGNOSIS — R0789 Other chest pain: Secondary | ICD-10-CM

## 2022-12-25 DIAGNOSIS — R051 Acute cough: Secondary | ICD-10-CM

## 2022-12-25 MED ORDER — ESCITALOPRAM OXALATE 5 MG PO TABS: 5.0000 mg | ORAL_TABLET | Freq: Every day | ORAL | 1 refills | Status: DC

## 2022-12-25 MED ORDER — TRAMADOL HCL 50 MG PO TABS: 50.0000 mg | ORAL_TABLET | Freq: Three times a day (TID) | ORAL | 0 refills | Status: AC | PRN

## 2022-12-25 NOTE — Progress Notes (Signed)
Patient ID: Rebecca Allen, female    DOB: [birth year], 57 y.o.   MRN: [id number]  This visit was conducted in person.  BP 110/80 (BP Location: Left Arm, Patient Position: Sitting, Cuff Size: Normal)   Pulse 77   Temp 98.2 F (36.8 C) (Temporal)   Ht 5\' 5"  (1.651 m)   Wt 163 lb 2 oz (74 kg)   SpO2 98%   BMI 27.15 kg/m    CC:  Chief Complaint  Patient presents with   Cough    Raspy-Seen in ER on 12/21/22 for CP   Rib Injury   Depression    Wants to decrease her lexapro back to 5 mg    Subjective:   HPI: Rebecca Allen is a 57 y.o. female presenting on 12/25/2022 for Cough (Raspy-Seen in ER on 12/21/22 for CP), Rib Injury, and Depression (Wants to decrease her lexapro back to 5 mg)   Recent emergency room visit on Dec 21, 2022 for acute chest pain, reviewed in detail   Troponins within normal limits Chest x-ray and EKG unremarkable CTA of chest and abdomen negative for acute process Pain improved if stretching arms upwards so felt it was likely musculoskeletal etiology.  Today she reports raspy cough.   She reports she had fallen  proceeding the chest pain... lost balance.. fell along with with father in law... his elbow pressed into left lower chest wall. Has also been noting left sided  upper back pain.. neck and upper shoulder stiff bilaterally.  She has been using heat, ibuprofen ( upsetting stomach) and tylenol., lidocaine patches  She is trying to take deep breaths.   In last 2 days she has noted chest congestion. Mild SOB. Occ wheeze.  No fever.    MDD/GAD... had SE to 10 mg  of lexapro.. hss been decreasing to 5 mg.. this is working well.   Relevant past medical, surgical, family and social history reviewed and updated as indicated. Interim medical history since our last visit reviewed. Allergies and medications reviewed and updated. Outpatient Medications Prior to Visit  Medication Sig Dispense Refill   ALPRAZolam (XANAX) 0.5 MG tablet Take 1  tablet (0.5 mg total) by mouth daily as needed for anxiety. 20 tablet 0   aspirin 81 MG chewable tablet Chew 81 mg by mouth daily.     betamethasone dipropionate 0.05 % cream Apply topically 2 (two) times daily. 30 g 0   calcium carbonate (TUMS - DOSED IN MG ELEMENTAL CALCIUM) 500 MG chewable tablet Chew 6 tablets by mouth daily as needed for indigestion or heartburn.     estradiol (VIVELLE-DOT) 0.05 MG/24HR patch PLACE 1 PATCH TRANS-DERMALLY TWICE A WEEK. REMOVE OLD PATCH BEFORE APPLYING NEW PATCH. 24 patch 3   pantoprazole (PROTONIX) 40 MG tablet TAKE 1 TABLET BY MOUTH EVERY DAY 90 tablet 1   escitalopram (LEXAPRO) 10 MG tablet TAKE 1 TABLET BY MOUTH EVERY DAY 90 tablet 0   Varenicline Tartrate, Starter, (CHANTIX STARTING MONTH PAK) 0.5 MG X 11 & 1 MG X 42 TBPK Take one 0.5 mg tab daily for 3 days,then inc. to one 0.5 mg tab BID x 4 days, then inc. to one 1 mg tab BID. 53 each 0   No facility-administered medications prior to visit.     Per HPI unless specifically indicated in ROS section below Review of Systems  Constitutional:  Negative for fatigue and fever.  HENT:  Negative for congestion.   Eyes:  Negative for pain.  Respiratory:  Negative for cough and shortness of breath.   Cardiovascular:  Negative for chest pain, palpitations and leg swelling.  Gastrointestinal:  Negative for abdominal pain.  Genitourinary:  Negative for dysuria and vaginal bleeding.  Musculoskeletal:  Negative for back pain.  Neurological:  Negative for syncope, light-headedness and headaches.  Psychiatric/Behavioral:  Negative for dysphoric mood.    Objective:  BP 110/80 (BP Location: Left Arm, Patient Position: Sitting, Cuff Size: Normal)   Pulse 77   Temp 98.2 F (36.8 C) (Temporal)   Ht 5\' 5"  (1.651 m)   Wt 163 lb 2 oz (74 kg)   SpO2 98%   BMI 27.15 kg/m   Wt Readings from Last 3 Encounters:  12/25/22 163 lb 2 oz (74 kg)  12/21/22 162 lb (73.5 kg)  11/03/22 164 lb 2 oz (74.4 kg)      Physical  Exam Constitutional:      General: She is not in acute distress.    Appearance: Normal appearance. She is well-developed. She is not ill-appearing or toxic-appearing.  HENT:     Head: Normocephalic.     Right Ear: Hearing, tympanic membrane, ear canal and external ear normal. Tympanic membrane is not erythematous, retracted or bulging.     Left Ear: Hearing, tympanic membrane, ear canal and external ear normal. Tympanic membrane is not erythematous, retracted or bulging.     Nose: No mucosal edema or rhinorrhea.     Right Sinus: No maxillary sinus tenderness or frontal sinus tenderness.     Left Sinus: No maxillary sinus tenderness or frontal sinus tenderness.     Mouth/Throat:     Pharynx: Uvula midline.  Eyes:     General: Lids are normal. Lids are everted, no foreign bodies appreciated.     Conjunctiva/sclera: Conjunctivae normal.     Pupils: Pupils are equal, round, and reactive to light.  Neck:     Thyroid: No thyroid mass or thyromegaly.     Vascular: No carotid bruit.     Trachea: Trachea normal.  Cardiovascular:     Rate and Rhythm: Normal rate and regular rhythm.     Pulses: Normal pulses.     Heart sounds: Normal heart sounds, S1 normal and S2 normal. No murmur heard.    No friction rub. No gallop.  Pulmonary:     Effort: Pulmonary effort is normal. No tachypnea or respiratory distress.     Breath sounds: Normal breath sounds. No decreased breath sounds, wheezing, rhonchi or rales.  Chest:  Breasts:    Breasts are symmetrical.       Comments: Anterior and posterior left-sided chest wall tenderness Abdominal:     General: Bowel sounds are normal.     Palpations: Abdomen is soft.     Tenderness: There is no abdominal tenderness.  Musculoskeletal:     Cervical back: Normal range of motion and neck supple.  Lymphadenopathy:     Upper Body:     Right upper body: No supraclavicular, axillary or pectoral adenopathy.     Left upper body: No supraclavicular, axillary or  pectoral adenopathy.  Skin:    General: Skin is warm and dry.     Findings: No rash.  Neurological:     Mental Status: She is alert.  Psychiatric:        Mood and Affect: Mood is not anxious or depressed.        Speech: Speech normal.        Behavior: Behavior normal. Behavior is cooperative.  Thought Content: Thought content normal.        Judgment: Judgment normal.       Results for orders placed or performed during the hospital encounter of 12/21/22  Basic metabolic panel  Result Value Ref Range   Sodium 138 135 - 145 mmol/L   Potassium 3.8 3.5 - 5.1 mmol/L   Chloride 105 98 - 111 mmol/L   CO2 23 22 - 32 mmol/L   Glucose, Bld 105 (H) 70 - 99 mg/dL   BUN 16 6 - 20 mg/dL   Creatinine, Ser 1.61 0.44 - 1.00 mg/dL   Calcium 9.1 8.9 - 09.6 mg/dL   GFR, Estimated >04 >54 mL/min   Anion gap 10 5 - 15  CBC  Result Value Ref Range   WBC 5.7 4.0 - 10.5 K/uL   RBC 4.64 3.87 - 5.11 MIL/uL   Hemoglobin 14.2 12.0 - 15.0 g/dL   HCT 09.8 11.9 - 14.7 %   MCV 91.8 80.0 - 100.0 fL   MCH 30.6 26.0 - 34.0 pg   MCHC 33.3 30.0 - 36.0 g/dL   RDW 82.9 56.2 - 13.0 %   Platelets 272 150 - 400 K/uL   nRBC 0.0 0.0 - 0.2 %  Hepatic function panel  Result Value Ref Range   Total Protein 6.7 6.5 - 8.1 g/dL   Albumin 4.3 3.5 - 5.0 g/dL   AST 39 15 - 41 U/L   ALT 28 0 - 44 U/L   Alkaline Phosphatase 50 38 - 126 U/L   Total Bilirubin 0.6 0.3 - 1.2 mg/dL   Bilirubin, Direct <8.6 0.0 - 0.2 mg/dL   Indirect Bilirubin NOT CALCULATED 0.3 - 0.9 mg/dL  Lipase, blood  Result Value Ref Range   Lipase 36 11 - 51 U/L  Troponin I (High Sensitivity)  Result Value Ref Range   Troponin I (High Sensitivity) 3 <18 ng/L  Troponin I (High Sensitivity)  Result Value Ref Range   Troponin I (High Sensitivity) <2 <18 ng/L    Assessment and Plan  Acute chest wall pain Assessment & Plan: Acute, clear lung exam Most likely secondary to chest wall injury following fall as well as element of whiplash  given pain in the left upper neck to lower chest No red flags. Lung exam clear Recommended stretching massage heat and limited lifting.  Return and ER precautions provided   Acute cough Assessment & Plan: Acute most likely viral upper respiratory tract infection versus allergies. Symptomatic care.   Other orders -     Escitalopram Oxalate; Take 1 tablet (5 mg total) by mouth daily.  Dispense: 90 tablet; Refill: 1 -     traMADol HCl; Take 1 tablet (50 mg total) by mouth every 8 (eight) hours as needed for up to 5 days.  Dispense: 15 tablet; Refill: 0    No follow-ups on file.   Kerby Nora, MD

## 2022-12-25 NOTE — Assessment & Plan Note (Signed)
Acute most likely viral upper respiratory tract infection versus allergies. Symptomatic care.

## 2022-12-25 NOTE — Assessment & Plan Note (Addendum)
Acute, clear lung exam Most likely secondary to chest wall injury following fall as well as element of whiplash given pain in the left upper neck to lower chest No red flags. Lung exam clear Recommended stretching massage heat and limited lifting.  Return and ER precautions provided

## 2023-01-04 ENCOUNTER — Other Ambulatory Visit: Payer: Self-pay | Admitting: Family Medicine

## 2023-01-05 MED ORDER — ALPRAZOLAM 0.5 MG PO TABS: 0.5000 mg | ORAL_TABLET | Freq: Every day | ORAL | 0 refills | Status: DC | PRN

## 2023-01-05 NOTE — Telephone Encounter (Signed)
Last office visit 12/25/2022 for chest wall pain.  Last refilled 11/03/2022 for #20 with no refills.  Next Appt: No future appointments with PCP.

## 2023-01-05 NOTE — Progress Notes (Deleted)
01/05/23 12:56 PM   Rebecca Allen 1966/02/15 [id number]  Referring provider:  Excell Seltzer, MD [address] [place],  [place] [address]  Urological history  1. Nephrolithiasis  -Stone composition of 70% calcium oxalate dihydrate, 25% calcium monohydrate and 5% hydroxyapatite - S/p ESWL with Dr. Lonna Cobb on 06/27/2020 for 6 mm left UPJ stone with subsequent development of a left subcapsular hematoma - RUS dated 08/29/2020 notable for stable appearance of the left lower pole subcapsular hematoma. -CTU (06/2022) 1 mm stone in the upper pole of right kidney  2. Hematoma of left kidney -resolved on CT renal stone study 04/2021  3. Intermediate risk hematuria -smoker -CTU (06/2022) 1 mm stone in the upper pole of right kidney -cysto (06/2022) NED  No chief complaint on file.   HPI: Rebecca Allen is a 57 y.o.female who pesents today for 6 month follow up.    UA ***   PMH: Past Medical History:  Diagnosis Date   Anxiety    Depression    GERD (gastroesophageal reflux disease)    Grief at loss of child 2022   History of kidney stones    Hyperlipidemia    Kidney stone    Pneumonia    Treadmill stress test negative for angina pectoris 12/18/2010    Surgical History: Past Surgical History:  Procedure Laterality Date   ABDOMINAL HYSTERECTOMY  2001   with 1 ovary removed; remaining over removed a yr later   CHOLECYSTECTOMY  2002   COLONOSCOPY     EXTRACORPOREAL SHOCK WAVE LITHOTRIPSY Left 06/27/2020   Procedure: EXTRACORPOREAL SHOCK WAVE LITHOTRIPSY (ESWL);  Surgeon: Riki Altes, MD;  Location: [facility] ORS;  Service: Urology;  Laterality: Left;   UPPER GI ENDOSCOPY  2019    Home Medications:  Allergies as of 01/06/2023       Reactions   Metoclopramide Hcl    REACTION: Nervousness   Morphine And Codeine Nausea Only   Venlafaxine Other (See Comments)   Hot flashes, clammy and nausea        Medication List        Accurate as of Jan 05, 2023 12:56 PM. If you have any questions, ask your nurse or doctor.          ALPRAZolam 0.5 MG tablet Commonly known as: XANAX Take 1 tablet (0.5 mg total) by mouth daily as needed for anxiety.   aspirin 81 MG chewable tablet Chew 81 mg by mouth daily.   betamethasone dipropionate 0.05 % cream Apply topically 2 (two) times daily.   calcium carbonate 500 MG chewable tablet Commonly known as: TUMS - dosed in mg elemental calcium Chew 6 tablets by mouth daily as needed for indigestion or heartburn.   escitalopram 5 MG tablet Commonly known as: LEXAPRO Take 1 tablet (5 mg total) by mouth daily.   estradiol 0.05 MG/24HR patch Commonly known as: VIVELLE-DOT PLACE 1 PATCH TRANS-DERMALLY TWICE A WEEK. REMOVE OLD PATCH BEFORE APPLYING NEW PATCH.   pantoprazole 40 MG tablet Commonly known as: PROTONIX TAKE 1 TABLET BY MOUTH EVERY DAY        Allergies:  Allergies  Allergen Reactions   Metoclopramide Hcl     REACTION: Nervousness   Morphine And Codeine Nausea Only   Venlafaxine Other (See Comments)    Hot flashes, clammy and nausea    Family History: Family History  Problem Relation Age of Onset   COPD Mother    Heart attack Father 58   Hypertension Father  Heart disease Father        stent placement    Congenital heart disease Sister    Leukemia Brother    Anxiety disorder Maternal Uncle    Alcohol abuse Maternal Uncle    Kidney failure Maternal Grandfather    COPD Paternal Grandmother    Breast cancer Neg Hx     Social History:  reports that she has been smoking e-cigarettes. She has never used smokeless tobacco. She reports current alcohol use of about 12.0 standard drinks of alcohol per week. She reports that she does not use drugs.   Physical Exam: There were no vitals taken for this visit.  Constitutional:  Well nourished. Alert and oriented, No acute distress. HEENT: [place] AT, moist mucus membranes.  Trachea midline, no masses. Cardiovascular: No  clubbing, cyanosis, or edema. Respiratory: Normal respiratory effort, no increased work of breathing. GU: No CVA tenderness.  No bladder fullness or masses. Vulvovaginal atrophy w/ pallor, loss of rugae, introital retraction, excoriations.  Vulvar thinning, fusion of labia, clitoral hood retraction, prominent urethral meatus.   *** external genitalia, *** pubic hair distribution, no lesions.  Normal urethral meatus, no lesions, no prolapse, no discharge.   No urethral masses, tenderness and/or tenderness. No bladder fullness, tenderness or masses. *** vagina mucosa, *** estrogen effect, no discharge, no lesions, *** pelvic support, *** cystocele and *** rectocele noted.  No cervical motion tenderness.  Uterus is freely mobile and non-fixed.  No adnexal/parametria masses or tenderness noted.  Anus and perineum are without rashes or lesions.   ***  Neurologic: Grossly intact, no focal deficits, moving all 4 extremities. Psychiatric: Normal mood and affect.    Laboratory Data:    Latest Ref Rng & Units 12/21/2022   12:54 PM 10/18/2022    4:43 AM 10/16/2022   11:38 AM  CBC  WBC 4.0 - 10.5 K/uL 5.7  6.7  5.9   Hemoglobin 12.0 - 15.0 g/dL 16.1  09.6  04.5   Hematocrit 36.0 - 46.0 % 42.6  41.2  44.1   Platelets 150 - 400 K/uL 272  294  286.0        Latest Ref Rng & Units 12/21/2022   12:54 PM 10/18/2022    4:43 AM 10/16/2022   11:38 AM  BMP  Glucose 70 - 99 mg/dL 409  91  86   BUN 6 - 20 mg/dL 16  13  16    Creatinine 0.44 - 1.00 mg/dL 8.11  9.14  7.82   Sodium 135 - 145 mmol/L 138  139  138   Potassium 3.5 - 5.1 mmol/L 3.8  3.7  4.2   Chloride 98 - 111 mmol/L 105  109  103   CO2 22 - 32 mmol/L 23  21  28    Calcium 8.9 - 10.3 mg/dL 9.1  9.6  9.8      Urinalysis See Epic and HPI I have reviewed the labs.    Pertinent Imaging: CLINICAL DATA:  Chest pain, pain left upper extremity   EXAM: CT ANGIOGRAPHY CHEST, ABDOMEN AND PELVIS   TECHNIQUE: Non-contrast CT of the chest was initially  obtained.   Multidetector CT imaging through the chest, abdomen and pelvis was performed using the standard protocol during bolus administration of intravenous contrast. Multiplanar reconstructed images and MIPs were obtained and reviewed to evaluate the vascular anatomy.   RADIATION DOSE REDUCTION: This exam was performed according to the departmental dose-optimization program which includes automated exposure control, adjustment of the mA and/or kV according  to patient size and/or use of iterative reconstruction technique.   CONTRAST:  80mL OMNIPAQUE IOHEXOL 350 MG/ML SOLN   COMPARISON:  10/18/2022   FINDINGS: CTA CHEST FINDINGS   Cardiovascular: There is no evidence of mural hematoma in noncontrast images off thoracic aorta. There is homogeneous enhancement in thoracic aorta. There is no demonstrable intimal flap. Major branches of the thoracic aorta appear patent.   Mediastinum/Nodes: No acute findings are seen.   Lungs/Pleura: There is no focal pulmonary consolidation. Small linear densities in right middle lobe have not changed suggesting scarring or subsegmental atelectasis. There is no pleural effusion or pneumothorax.   Musculoskeletal: No acute findings are seen.   Review of the MIP images confirms the above findings.   CTA ABDOMEN AND PELVIS FINDINGS   VASCULAR   Aorta: There are few scattered atherosclerotic plaques and calcifications   Celiac: Appears normal   SMA: Appears normal   Renals: Appear normal   IMA: Appears patent   Inflow: Unremarkable   Veins: Unremarkable   Review of the MIP images confirms the above findings.   NON-VASCULAR   Hepatobiliary: Surgical clips are seen in gallbladder fossa. No focal abnormalities are seen in liver. There is no dilation of bile ducts.   Pancreas: No focal abnormalities are seen.   Spleen: Unremarkable.   Adrenals/Urinary Tract: Adrenals are unremarkable. There is no hydronephrosis. There are no  renal or ureteral stones. Urinary bladder is unremarkable.   Stomach/Bowel: Stomach is unremarkable. Small bowel loops are not dilated. Appendix is unremarkable. There is no pericecal inflammation. There is no significant wall thickening in colon. There is no pericolic stranding.   Lymphatic: No significant lymphadenopathy is seen.   Reproductive: Uterus is not seen.  There are no adnexal masses.   Other: There is no ascites or pneumoperitoneum. Small umbilical hernia containing fat is seen. Small left inguinal hernia containing fat is seen.   Musculoskeletal: No acute findings are seen.   Review of the MIP images confirms the above findings.   IMPRESSION: There is no evidence of dissection or focal aneurysmal dilation in thoracic and abdominal aorta. Major branches of thoracic and abdominal aorta appear patent. There are small scattered atherosclerotic changes seen in abdominal aorta and right femoral arteries.   There is no focal pulmonary consolidation. There is no evidence of mediastinal or retroperitoneal hematoma. There is no evidence of intestinal obstruction or pneumoperitoneum. There is no hydronephrosis. Appendix is not dilated.   Status post cholecystectomy.     Electronically Signed   By: Ernie Avena M.D.   On: 12/21/2022 16:26 I have independently reviewed the films.  See HPI.    Assessment & Plan:    1. Intermediate risk hematuria -UA ***   No follow-ups on file.  Cloretta Ned  [facility] [address] [place], [place] [address] [phone]

## 2023-01-06 ENCOUNTER — Encounter: Payer: Self-pay | Admitting: Urology

## 2023-01-06 ENCOUNTER — Ambulatory Visit: Payer: 59 | Admitting: Urology

## 2023-01-06 DIAGNOSIS — R319 Hematuria, unspecified: Secondary | ICD-10-CM

## 2023-02-05 ENCOUNTER — Telehealth: Payer: Self-pay

## 2023-02-05 ENCOUNTER — Other Ambulatory Visit: Payer: Self-pay | Admitting: Family Medicine

## 2023-02-05 MED ORDER — TAMSULOSIN HCL 0.4 MG PO CAPS: 0.4000 mg | ORAL_CAPSULE | Freq: Every day | ORAL | 0 refills | Status: DC

## 2023-02-05 MED ORDER — ACETAMINOPHEN-CODEINE 300-30 MG PO TABS: 1.0000 | ORAL_TABLET | Freq: Three times a day (TID) | ORAL | 0 refills | Status: DC | PRN

## 2023-02-05 MED ORDER — ONDANSETRON HCL 4 MG PO TABS: 4.0000 mg | ORAL_TABLET | Freq: Three times a day (TID) | ORAL | 0 refills | Status: DC | PRN

## 2023-02-05 MED ORDER — ACETAMINOPHEN-CODEINE 300-30 MG PO TABS: 1.0000 | ORAL_TABLET | Freq: Three times a day (TID) | ORAL | 0 refills | Status: AC | PRN

## 2023-02-05 NOTE — Telephone Encounter (Signed)
Spoke with Tish and advised her to call to other pharmacy in the area other than [facility] to see if they have the Tylenol #3 in stock.  Otherwise she would have to call the hospital where she was seen to get anything stronger.  Patient states she will just take the Flomax and the nausea medication and when she get back to town, if she still feels like she needs something for pain,  she will go to an Urgent Care.

## 2023-02-05 NOTE — Telephone Encounter (Signed)
Pt called to let Lupita Leash know that [facility] [phone] - [place], [place] - [address] AT [facility] [address], has the meds in stock. Pt stated the pharmacy closes @ 5pm today, 6/28 & they aren't open tomorrow, 6/29. Call back # [phone]

## 2023-02-05 NOTE — Transitions of Care (Post Inpatient/ED Visit) (Signed)
pt is still at [facility] and will be returning home on 02/06/23. pt having left lower abd pain with pain level 10- 11.; this morning urine is dark brown in color, pt is nauseated,pt left ED around 1:30 AM this morning. pt has no fever, Tylenol not helping pain.Dr Ermalene Searing will send in Tylenol with Codeine,flomax and med for nausea [facility] if pt has fever, more abd pain or bleeding or symptoms of UTI pt will go to ED. pt will strain urine and increase fluids.pt has appt with Dr Alferd Patee on 02/08/23 at 11:40. UC & ED precautions given an         02/05/2023  Name: MARGARETTA CHEFF MRN: [id number] DOB: 1966-06-24  Today's TOC FU Call Status: Today's TOC FU Call Status:: Successful TOC FU Call Competed TOC FU Call Complete Date: 02/05/23  Transition Care Management Follow-up Telephone Call Date of Discharge: 02/05/23 Discharge Facility: Other [facility]) Name of Other (Non[facility]) Discharge Facility: [facility] Type of Discharge: Emergency Department Reason for ED Visit: Other: (abdpain; pt dx with kidney stone in bladder) How have you been since you were released from the hospital?: Worse Any questions or concerns?: Yes (rerquesting pain med) Patient Questions/Concerns:: pt is still at [facility] and will be returning home on 02/06/23. pt having left lower abd pain with pain level 10- 11.; this morning urine is dark brown in color, pt is nauseated,pt left ED around 1:30 AM this morning. pt has no fever, Tylenol not helping pain.Dr Ermalene Searing will send in Tylenol with Codeine,flomax and med for nausea. if pt has fever, more abd pain or bleeding or symptoms of UTI pt will go to ED. pt will strain urine and increase fluids.pt has appt with Dr Alferd Patee on 02/08/23 at 11:40. UC & ED precautions given and pt voiced understanding. Patient Questions/Concerns Addressed: Notified Provider of Patient Questions/Concerns  Items Reviewed: Did you receive and understand  the discharge instructions provided?: Yes Medications obtained,verified, and reconciled?: Partial Review Completed (pt is aware of meds [facility] due to pts pain level did not review all maintenance meds.) Reason for Partial Mediation Review: pt is aware of meds Dr E[facility]ene Searing [facility] due to pts pain level did not review all maintenance meds. Any new allergies since your discharge?: No Dietary orders reviewed?: NA Do you have support at home?: Yes Name of Support/Comfort Primary Source: [place]  Medications Reviewed Today: Medications Reviewed Today     Reviewed by Damita Lack, CMA (Certified Medical Assistant) on 12/25/22 at 1011  Med List Status: <None>   Medication Order Taking? Sig Documenting Provider Last Dose Status Informant  ALPRAZolam (XANAX) 0.5 MG tablet [id number]  Take 1 tablet (0.5 mg total) by mouth daily as needed for anxiety. Excell Seltzer, MD  Active   aspirin 81 MG chewable tablet [id number]  Chew 81 mg by mouth daily. [provider]  Active   betamethasone dipropionate 0.05 % cream [id number]  Apply topically 2 (two) times daily. Bedsole, Amy E, MD  Active   calcium carbonate (TUMS - DOSED IN MG ELEMENTAL CALCIUM) 500 MG chewable tablet [id number]  Chew 6 tablets by mouth daily as needed for indigestion or heartburn. [provider]  Active   escitalopram (LEXAPRO) 10 MG tablet [id number]  TAKE 1 TABLET BY MOUTH EVERY DAY Bedsole, Amy E, MD  Active   estradiol (VIVELLE-DOT) 0.05 MG/24HR patch [id number]  PLACE 1 PATCH TRANS-DERMALLY TWICE A WEEK. REMOVE OLD PATCH BEFORE APPLYING NEW PATCH. Excell Seltzer, MD  Active   pantoprazole (PROTONIX) 40 MG tablet [id number]  TAKE 1 TABLET BY MOUTH EVERY DAY Bedsole, Amy E, MD  Active   Varenicline Tartrate, Starter, (CHANTIX STARTING MONTH PAK) 0.5 MG X 11 & 1 MG X 42 TBPK [id number]  Take one 0.5 mg tab daily for 3 days,then inc. to one 0.5  mg tab BID x 4 days, then inc. to one 1 mg tab BID. Excell Seltzer, MD  Active             Home Care and Equipment/Supplies: Were Home Health Services Ordered?: NA Any new equipment or medical supplies ordered?: NA  Functional Questionnaire: Do you need assistance with bathing/showering or dressing?: No Do you need assistance with meal preparation?: No Do you need assistance with eating?: No Do you have difficulty maintaining continence: No Do you need assistance with getting out of bed/getting out of a chair/moving?: No Do you have difficulty managing or taking your medications?: No  Follow up appointments reviewed: PCP Follow-up appointment confirmed?: Yes MD Provider Line Number:[phone] Given: Yes Date of PCP follow-up appointment?: 02/08/23 Follow-up Provider: Dr Karleen [facility] Follow-up appointment confirmed?: NA Do you need transportation to your follow-up appointment?: No Do you understand care options if your condition(s) worsen?: Yes-patient verbalized understanding    SIGNATURE Lewanda Rife, LPN

## 2023-02-05 NOTE — Telephone Encounter (Signed)
Patient called in stating that the pharmacy  that doesn't have  acetaminophen-codeine (TYLENOL #3) 300-30 MG tablet ,and that the tramadol interacts with her  escitalopram (LEXAPRO) 5 MG tablet. She said that the pharmacy suggest that an alternative be called in for the tylenol.

## 2023-02-05 NOTE — Telephone Encounter (Signed)
Tish notified by telephone that Rx has been sent to [facility] as requested.

## 2023-02-05 NOTE — Telephone Encounter (Signed)
There is not a significant interaction that contraindicates the use of Tylenol 3 with Lexapro and I do think it should be okay to take together.  But if she is not interested in using this medication unfortunately given I have not seen her for the kidney stone currently ongoing, I cannot prescribe her a stronger narcotic pain medication. She will have to contact the hospital where she was seen or be seen again at an urgent care locally until she gets back to our area

## 2023-02-05 NOTE — Telephone Encounter (Signed)
See separate order entry as well.

## 2023-02-05 NOTE — Progress Notes (Signed)
Pt with kidney stone discharged from ED without pain meds. See phone note. Patient with side effects in the past to tramadol.  Sent in prescription for Tylenol with codeine to use short-term until she is back in our area for reassessment if needed.  Also sent in a prescription for Flomax to increase urine flow and Zofran to use prn Nausea

## 2023-02-07 NOTE — Progress Notes (Deleted)
    Marshawn Ninneman T. Cali Hope, MD, CAQ Sports Medicine [facility] [address] [place], [address]  Phone: [phone]  FAX: [phone]  Rebecca Allen - 57 y.o. female  MRN [id number]  Date of Birth: 08-15-1965  Date: 02/08/2023  PCP: Excell Seltzer, MD  Referral: Excell Seltzer, MD  No chief complaint on file.  Subjective:   Rebecca Allen is a 57 y.o. very pleasant female patient with There is no height or weight on file to calculate BMI. who presents with the following:  This is a follow-up to an ER visit in [place], where the patient had severe left flank pain that she rated at 10 out of 10 pain.  While in the ER she did get some Toradol and Dilaudid, which helped with pain.  CT was reviewed, and it was not entirely clear if there was a stone present at all.  Patient has a significant history for prior nephrolithiasis, and she did have some left-sided flank pain and CVA tenderness.  She did reach Dr. Ermalene Searing by phone, and she was sent some Tylenol 3, Toradol, and Flomax in to her pharmacy in [place], which she was able to retrieve.    Review of Systems is noted in the HPI, as appropriate  Objective:   There were no vitals taken for this visit.  GEN: No acute distress; alert,appropriate. PULM: Breathing comfortably in no respiratory distress PSYCH: Normally interactive.   Laboratory and Imaging Data:  Assessment and Plan:   ***

## 2023-02-08 ENCOUNTER — Ambulatory Visit: Payer: 59 | Admitting: Family Medicine

## 2023-02-08 DIAGNOSIS — R109 Unspecified abdominal pain: Secondary | ICD-10-CM

## 2023-02-09 ENCOUNTER — Ambulatory Visit: Payer: 59 | Admitting: Family Medicine

## 2023-02-12 ENCOUNTER — Other Ambulatory Visit: Payer: Self-pay | Admitting: Family Medicine

## 2023-03-01 ENCOUNTER — Other Ambulatory Visit: Payer: Self-pay | Admitting: Family Medicine

## 2023-03-01 NOTE — Telephone Encounter (Signed)
Last office visit 05/17/204 for acute chest wall pain.  Last refilled 01/05/2023 for #20 with no refills.  Next appt: No future appointments.

## 2023-03-02 MED ORDER — ALPRAZOLAM 0.5 MG PO TABS: 0.5000 mg | ORAL_TABLET | Freq: Every day | ORAL | 0 refills | Status: DC | PRN

## 2023-03-03 ENCOUNTER — Other Ambulatory Visit: Payer: Self-pay | Admitting: Family Medicine

## 2023-03-22 ENCOUNTER — Other Ambulatory Visit: Payer: Self-pay | Admitting: Family Medicine

## 2023-03-22 NOTE — Telephone Encounter (Signed)
Please schedule CPE with fasting labs prior with Dr. Ermalene Searing for after 04/25/23.

## 2023-03-22 NOTE — Telephone Encounter (Signed)
LVM for patient to c/b and schedule.  

## 2023-04-04 ENCOUNTER — Other Ambulatory Visit: Payer: Self-pay | Admitting: Family Medicine

## 2023-04-05 NOTE — Telephone Encounter (Signed)
Last office visit 12/25/2022 for cough, rib pain and depression.  Last refilled 03/02/23 for #20 with no refills.  Next Appt: No future appointments.

## 2023-04-06 MED ORDER — ALPRAZOLAM 0.5 MG PO TABS: 0.5000 mg | ORAL_TABLET | Freq: Every day | ORAL | 0 refills | Status: DC | PRN

## 2023-05-12 ENCOUNTER — Other Ambulatory Visit: Payer: Self-pay | Admitting: Family Medicine

## 2023-05-12 MED ORDER — PANTOPRAZOLE SODIUM 40 MG PO TBEC: 40.0000 mg | DELAYED_RELEASE_TABLET | Freq: Every day | ORAL | 0 refills | Status: DC

## 2023-05-12 MED ORDER — ALPRAZOLAM 0.5 MG PO TABS: 0.5000 mg | ORAL_TABLET | Freq: Every day | ORAL | 0 refills | Status: DC | PRN

## 2023-05-20 ENCOUNTER — Other Ambulatory Visit: Payer: Self-pay | Admitting: Family Medicine

## 2023-05-20 DIAGNOSIS — Z79890 Hormone replacement therapy (postmenopausal): Secondary | ICD-10-CM

## 2023-05-20 NOTE — Telephone Encounter (Signed)
LAST APPOINTMENT DATE: 12/25/22 acute issue    NEXT APPOINTMENT DATE: Visit date not found    LAST REFILL: 06/04/22  QTY: 24 [id number]

## 2023-05-23 ENCOUNTER — Other Ambulatory Visit: Payer: Self-pay

## 2023-05-23 ENCOUNTER — Emergency Department: Payer: 59

## 2023-05-23 ENCOUNTER — Encounter: Payer: Self-pay | Admitting: Intensive Care

## 2023-05-23 ENCOUNTER — Emergency Department
Admission: EM | Admit: 2023-05-23 | Discharge: 2023-05-23 | Disposition: A | Discharge status: Home / Self Care | Payer: 59 | Attending: Emergency Medicine | Admitting: Emergency Medicine

## 2023-05-23 DIAGNOSIS — R0789 Other chest pain: Secondary | ICD-10-CM | POA: Insufficient documentation

## 2023-05-23 LAB — CBC
HCT: 39.5 % (ref 36.0–46.0)
Hemoglobin: 13.3 g/dL (ref 12.0–15.0)
MCH: 31.4 pg (ref 26.0–34.0)
MCHC: 33.7 g/dL (ref 30.0–36.0)
MCV: 93.2 fL (ref 80.0–100.0)
Platelets: 278 10*3/uL (ref 150–400)
RBC: 4.24 MIL/uL (ref 3.87–5.11)
RDW: 12.6 % (ref 11.5–15.5)
WBC: 5.7 10*3/uL (ref 4.0–10.5)
nRBC: 0 % (ref 0.0–0.2)

## 2023-05-23 LAB — BASIC METABOLIC PANEL
Anion gap: 12 (ref 5–15)
BUN: 12 mg/dL (ref 6–20)
CO2: 22 mmol/L (ref 22–32)
Calcium: 9.1 mg/dL (ref 8.9–10.3)
Chloride: 105 mmol/L (ref 98–111)
Creatinine, Ser: 0.62 mg/dL (ref 0.44–1.00)
GFR, Estimated: >60 mL/min (ref >60)
Glucose, Bld: 102 mg/dL — ABNORMAL HIGH (ref 70–99)
Potassium: 3.8 mmol/L (ref 3.5–5.1)
Sodium: 139 mmol/L (ref 135–145)

## 2023-05-23 LAB — TROPONIN I (HIGH SENSITIVITY)
Troponin I (High Sensitivity): 3 ng/L (ref <18)
Troponin I (High Sensitivity): 3 ng/L (ref <18)

## 2023-05-23 MED ORDER — ONDANSETRON HCL 4 MG/2ML IJ SOLN
4.0000 mg | Freq: Once | INTRAMUSCULAR | Status: AC
  Administered 2023-05-23: 4 mg via INTRAVENOUS
  Filled 2023-05-23: qty 2

## 2023-05-23 MED ORDER — KETOROLAC TROMETHAMINE 15 MG/ML IJ SOLN
15.0000 mg | Freq: Once | INTRAMUSCULAR | Status: AC
  Administered 2023-05-23: 15 mg via INTRAVENOUS
  Filled 2023-05-23: qty 1

## 2023-05-23 MED ORDER — HYDROMORPHONE HCL 1 MG/ML IJ SOLN
1.0000 mg | Freq: Once | INTRAMUSCULAR | Status: AC
  Administered 2023-05-23: 1 mg via INTRAVENOUS
  Filled 2023-05-23: qty 1

## 2023-05-23 MED ORDER — FENTANYL CITRATE PF 50 MCG/ML IJ SOSY
50.0000 ug | PREFILLED_SYRINGE | Freq: Once | INTRAMUSCULAR | Status: AC
  Administered 2023-05-23: 50 ug via INTRAVENOUS
  Filled 2023-05-23: qty 1

## 2023-05-23 MED ORDER — OXYCODONE-ACETAMINOPHEN 5-325 MG PO TABS
1.0000 | ORAL_TABLET | Freq: Once | ORAL | Status: AC
  Administered 2023-05-23: 1 via ORAL
  Filled 2023-05-23: qty 1

## 2023-05-23 MED ORDER — OXYCODONE-ACETAMINOPHEN 5-325 MG PO TABS
1.0000 | ORAL_TABLET | Freq: Four times a day (QID) | ORAL | 0 refills | Status: DC | PRN
Start: 2023-05-23 — End: 2023-08-03

## 2023-05-23 NOTE — ED Provider Notes (Signed)
[facility] Provider Note    Event Date/Time   First MD Initiated Contact with Patient 05/23/23 1842     (approximate)   History   Chief Complaint: Chest Pain   HPI  Rebecca Allen is a 57 y.o. female with a history of GERD, hyperlipidemia, anxiety who reports anterior chest pain that started about 5:30 PM tonight, gradual onset and worsening, radiating to the back.  No shortness of breath, not pleuritic, not exertional.  Feels sharp.  No dizziness.  She does note that last night her adult nephew who is 6 foot 5, 200 pounds got too intoxicated and she had to help lift him off the floor and wonders if she might of strained chest wall muscle.     Physical Exam   Triage Vital Signs: ED Triage Vitals  Encounter Vitals Group     BP 05/23/23 1836 (!) 119/97     Systolic BP Percentile --      Diastolic BP Percentile --      Pulse Rate 05/23/23 1836 91     Resp 05/23/23 1836 16     Temp 05/23/23 1836 98.2 F (36.8 C)     Temp Source 05/23/23 1836 Oral     SpO2 05/23/23 1836 100 %     Weight 05/23/23 1834 160 lb (72.6 kg)     Height 05/23/23 1834 5\' 5"  (1.651 m)     Head Circumference --      Peak Flow --      Pain Score 05/23/23 1834 10     Pain Loc --      Pain Education --      Exclude from Growth Chart --     Most recent vital signs: Vitals:   05/23/23 2130 05/23/23 2200  BP: (!) 103/57 121/60  Pulse: 68 (!) 50  Resp: 17 (!) 24  Temp:    SpO2: 98% 95%    General: Awake, no distress.  CV:  Good peripheral perfusion.  Regular rate and rhythm.  No murmurs.  Normal distal pulses. Resp:  Normal effort.  To auscultation bilaterally Abd:  No distention.  Soft nontender Other:  Pain is worse with movement and holding herself upright.  Pain is reproducible with exquisite tenderness along the right sternal margin around the fourth intercostal space   ED Results / Procedures / Treatments   Labs (all labs ordered are listed, but only  abnormal results are displayed) Labs Reviewed  BASIC METABOLIC PANEL - Abnormal; Notable for the following components:      Result Value   Glucose, Bld 102 (*)    All other components within normal limits  CBC  TROPONIN I (HIGH SENSITIVITY)  TROPONIN I (HIGH SENSITIVITY)     EKG Interpreted by me Sinus rhythm rate of 86.  Normal axis, normal intervals.  Normal QRS ST segments and T waves.  No ischemic changes.   RADIOLOGY Chest x-ray interpreted by me, appears normal.  Radiology report reviewed   PROCEDURES:  Procedures   MEDICATIONS ORDERED IN ED: Medications  oxyCODONE-acetaminophen (PERCOCET/ROXICET) 5-325 MG per tablet 1 tablet (has no administration in time range)  ketorolac (TORADOL) 15 MG/ML injection 15 mg (15 mg Intravenous Given 05/23/23 1938)  fentaNYL (SUBLIMAZE) injection 50 mcg (50 mcg Intravenous Given 05/23/23 1939)  ondansetron (ZOFRAN) injection 4 mg (4 mg Intravenous Given 05/23/23 1939)  HYDROmorphone (DILAUDID) injection 1 mg (1 mg Intravenous Given 05/23/23 2110)     IMPRESSION / MDM / ASSESSMENT AND PLAN /  ED COURSE  I reviewed the triage vital signs and the nursing notes.  DDx: Chest wall strain/spasm, pneumothorax, NSTEMI, anxiety attack.  Doubt PE or dissection  [name] presentation is most consistent with acute presentation with potential threat to life or bodily function.     Clinical Course as of 05/23/23 2230  Sun May 23, 2023  1919 Patient presents with severe anterior chest pain.  She did have to help lift her large adult family member last night.  She has reproducible chest wall pain anteriorly consistent with chest wall muscle strain and spasm.  Initial EKG, chest x-ray, and labs are normal.  Will repeat troponin while giving Toradol, Zofran, fentanyl for symptom relief. [PS]    Clinical Course User Index [PS] Sharman Cheek, MD     ----------------------------------------- 10:30 PM on  05/23/2023 ----------------------------------------- Repeat troponin normal.  Supportive care at home.  Stable for discharge.  FINAL CLINICAL IMPRESSION(S) / ED DIAGNOSES   Final diagnoses:  Chest wall pain     Rx / DC Orders   ED Discharge Orders          Ordered    oxyCODONE-acetaminophen (PERCOCET) 5-325 MG tablet  Every 6 hours PRN        05/23/23 2228             Note:  This document was prepared using Dragon voice recognition software and may include unintentional dictation errors.   Sharman Cheek, MD 05/23/23 2230

## 2023-05-23 NOTE — ED Notes (Signed)
120 ml water given and tolerated well by patient

## 2023-05-23 NOTE — ED Triage Notes (Signed)
Patient arrived by EMS from home with c/o of anxiety that led to central, chest pain with SOB.  A&O x4 upon arrival  EMS gave: 324 aspirin 120HR 120/68 b/p

## 2023-06-16 ENCOUNTER — Telehealth: Payer: 59 | Admitting: Nurse Practitioner

## 2023-06-16 ENCOUNTER — Ambulatory Visit: Payer: 59

## 2023-06-16 ENCOUNTER — Encounter: Payer: Self-pay | Admitting: Nurse Practitioner

## 2023-06-16 DIAGNOSIS — G4489 Other headache syndrome: Secondary | ICD-10-CM | POA: Insufficient documentation

## 2023-06-16 DIAGNOSIS — R051 Acute cough: Secondary | ICD-10-CM

## 2023-06-16 DIAGNOSIS — R52 Pain, unspecified: Secondary | ICD-10-CM | POA: Insufficient documentation

## 2023-06-16 DIAGNOSIS — R509 Fever, unspecified: Secondary | ICD-10-CM | POA: Insufficient documentation

## 2023-06-16 LAB — POCT FLU A/B STATUS
Influenza A, POC: NEGATIVE
Influenza B, POC: NEGATIVE

## 2023-06-16 LAB — POC COVID19 BINAXNOW: SARS Coronavirus 2 Ag: NEGATIVE

## 2023-06-16 MED ORDER — GUAIFENESIN-CODEINE 100-10 MG/5ML PO SOLN: 5.0000 mL | Freq: Three times a day (TID) | ORAL | 0 refills | Status: AC | PRN

## 2023-06-16 NOTE — Progress Notes (Signed)
Pt had video visit with Mordecai Maes NP earlier today and Mordecai Maes NP ordered covid and flu A&B. Pt came to [facility] and covid test and flu A & B test were done and results were negative for covid test and neg for Flu A & B. Results were entered and sent to Mordecai Maes NP.  Sending note to Mordecai Maes NP.

## 2023-06-16 NOTE — Progress Notes (Signed)
Ph: [phone] Fax: [phone]   Patient ID: Rebecca Allen, female    DOB: 07-01-1966, 57 y.o.   MRN: [id number]  Virtual visit completed through MyChart, a video enabled telemedicine application. Due to national recommendations of social distancing due to COVID-19, a virtual visit is felt to be most appropriate for this patient at this time. Reviewed limitations, risks, security and privacy concerns of performing a virtual visit and the availability of in person appointments. I also reviewed that there may be a patient responsible charge related to this service. The patient agreed to proceed.   Patient location: home Provider location: [place] at [facility], office Persons participating in this virtual visit: patient, provider   If any vitals were documented, they were collected by patient at home unless specified below.    There were no vitals taken for this visit.   CC: cough/fever Subjective:   HPI: Rebecca Allen is a 57 y.o. female presenting on 06/16/2023 for Cough (Pt complains of dry cough, throat pain from coughing, congestion, and slight fevers. Pt stated symptoms started Sunday. Has taken OTC mucinex and tylenol. Slight chest pain. 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 Not great releif      Relevant past medical, surgical, family and social history reviewed and updated as indicated. Interim medical history since our last visit reviewed. Allergies and medications reviewed and updated. Outpatient Medications Prior to Visit  Medication Sig Dispense Refill   ALPRAZolam (XANAX) 0.5 MG tablet Take 1 tablet (0.5 mg total) by mouth daily as needed for anxiety. 20 tablet 0   aspirin 81 MG chewable  tablet Chew 81 mg by mouth daily.     betamethasone dipropionate 0.05 % cream Apply topically 2 (two) times daily. 30 g 0   calcium carbonate (TUMS - DOSED IN MG ELEMENTAL CALCIUM) 500 MG chewable tablet Chew 6 tablets by mouth daily as needed for indigestion or heartburn.     clobetasol ointment (TEMOVATE) 0.05 % Apply topically 2 (two) times daily as needed.     escitalopram (LEXAPRO) 5 MG tablet Take 1 tablet (5 mg total) by mouth daily. 90 tablet 1   estradiol (VIVELLE-DOT) 0.05 MG/24HR patch PLACE 1 PATCH TRANS-DERMALLY TWICE A WEEK. REMOVE OLD PATCH BEFORE APPLYING NEW PATCH. 24 patch 0   ondansetron (ZOFRAN) 4 MG tablet Take 1 tablet (4 mg total) by mouth every 8 (eight) hours as needed for nausea or vomiting. 20 tablet 0   oxyCODONE-acetaminophen (PERCOCET) 5-325 MG tablet Take 1 tablet by mouth every 6 (six) hours as needed for severe pain. 8 tablet 0   pantoprazole (PROTONIX) 40 MG tablet Take 1 tablet (40 mg total) by mouth daily. 90 tablet 0   prednisoLONE acetate (PRED FORTE) 1 % ophthalmic suspension 1 drop 4 (four) times daily.     tamsulosin (FLOMAX) 0.4 MG CAPS capsule Take 1 capsule (0.4 mg total) by mouth daily. 15 capsule 0   No facility-administered medications prior to visit.     Per HPI unless specifically indicated in ROS section below Review of Systems  Constitutional:  Positive for fatigue and fever. Negative for chills.  HENT:  Positive for ear pain, sinus pressure, sinus pain and sore throat (scratchy).  Respiratory:  Positive for cough.   Cardiovascular:  Positive for chest pain.  Gastrointestinal:  Negative for abdominal pain, constipation, diarrhea and nausea.  Musculoskeletal:  Positive for myalgias.  Neurological:  Positive for headaches.   Objective:  There were no vitals taken for this visit.  Wt Readings from Last 3 Encounters:  05/23/23 160 lb (72.6 kg)  12/25/22 163 lb 2 oz (74 kg)  12/21/22 162 lb (73.5 kg)       Physical exam: Gen: alert,  NAD, not ill appearing Pulm: speaks in complete sentences without increased work of breathing Psych: normal mood, normal thought content      Results for orders placed or performed during the hospital encounter of 05/23/23  Basic metabolic panel  Result Value Ref Range   Sodium 139 135 - 145 mmol/L   Potassium 3.8 3.5 - 5.1 mmol/L   Chloride 105 98 - 111 mmol/L   CO2 22 22 - 32 mmol/L   Glucose, Bld 102 (H) 70 - 99 mg/dL   BUN 12 6 - 20 mg/dL   Creatinine, Ser 1.61 0.44 - 1.00 mg/dL   Calcium 9.1 8.9 - 09.6 mg/dL   GFR, Estimated >04 >54 mL/min   Anion gap 12 5 - 15  CBC  Result Value Ref Range   WBC 5.7 4.0 - 10.5 K/uL   RBC 4.24 3.87 - 5.11 MIL/uL   Hemoglobin 13.3 12.0 - 15.0 g/dL   HCT 09.8 11.9 - 14.7 %   MCV 93.2 80.0 - 100.0 fL   MCH 31.4 26.0 - 34.0 pg   MCHC 33.7 30.0 - 36.0 g/dL   RDW 82.9 56.2 - 13.0 %   Platelets 278 150 - 400 K/uL   nRBC 0.0 0.0 - 0.2 %  Troponin I (High Sensitivity)  Result Value Ref Range   Troponin I (High Sensitivity) 3 <18 ng/L  Troponin I (High Sensitivity)  Result Value Ref Range   Troponin I (High Sensitivity) 3 <18 ng/L   Assessment & Plan:   Fever, unspecified fever cause Assessment & Plan: Pending flu and COVID test.  Over-the-counter analgesics as needed   Acute cough Assessment & Plan: Pending flu and COVID test.  Will do guaifenesin-codeine cough syrup 5 mL 3 times daily as needed sedation precautions reviewed.  Patient has tolerated this medication in the past although she does have an intolerance to morphine and codeine listed   Body aches Assessment & Plan: Pending flu and COVID test.  Patient can use over-the-counter analgesics as needed   Other headache syndrome Assessment & Plan: Rest drink plenty of fluids over-the-counter analgesics as needed.  Pending flu and COVID test   Other orders -     guaiFENesin-Codeine; Take 5 mLs by mouth 3 (three) times daily as needed for up to 8 days.  Dispense: 120 mL;  Refill: 0     I discussed the assessment and treatment plan with the patient. The patient was provided an opportunity to ask questions and all were answered. The patient agreed with the plan and demonstrated an understanding of the instructions. The patient was advised to call back or seek an in-person evaluation if the symptoms worsen or if the condition fails to improve as anticipated.  Follow up plan: No follow-ups on file.  Audria Nine, NP

## 2023-06-16 NOTE — Assessment & Plan Note (Signed)
Pending flu and COVID test.  Will do guaifenesin-codeine cough syrup 5 mL 3 times daily as needed sedation precautions reviewed.  Patient has tolerated this medication in the past although she does have an intolerance to morphine and codeine listed

## 2023-06-16 NOTE — Assessment & Plan Note (Signed)
Pending flu and COVID test.  Over-the-counter analgesics as needed

## 2023-06-16 NOTE — Assessment & Plan Note (Signed)
Rest drink plenty of fluids over-the-counter analgesics as needed.  Pending flu and COVID test

## 2023-06-16 NOTE — Assessment & Plan Note (Signed)
Pending flu and COVID test.  Patient can use over-the-counter analgesics as needed

## 2023-06-22 ENCOUNTER — Other Ambulatory Visit: Payer: Self-pay | Admitting: Family Medicine

## 2023-06-22 MED ORDER — PANTOPRAZOLE SODIUM 40 MG PO TBEC: 40.0000 mg | DELAYED_RELEASE_TABLET | Freq: Every day | ORAL | 1 refills | Status: DC

## 2023-06-27 ENCOUNTER — Emergency Department: Payer: 59

## 2023-06-27 ENCOUNTER — Other Ambulatory Visit: Payer: Self-pay

## 2023-06-27 DIAGNOSIS — R0602 Shortness of breath: Secondary | ICD-10-CM | POA: Diagnosis present

## 2023-06-27 DIAGNOSIS — F410 Panic disorder [episodic paroxysmal anxiety] without agoraphobia: Secondary | ICD-10-CM | POA: Diagnosis not present

## 2023-06-27 DIAGNOSIS — Z5321 Procedure and treatment not carried out due to patient leaving prior to being seen by health care provider: Secondary | ICD-10-CM | POA: Insufficient documentation

## 2023-06-27 LAB — BASIC METABOLIC PANEL
Anion gap: 9 (ref 5–15)
BUN: 14 mg/dL (ref 6–20)
CO2: 24 mmol/L (ref 22–32)
Calcium: 8.9 mg/dL (ref 8.9–10.3)
Chloride: 103 mmol/L (ref 98–111)
Creatinine, Ser: 0.77 mg/dL (ref 0.44–1.00)
GFR, Estimated: >60 mL/min (ref >60)
Glucose, Bld: 115 mg/dL — ABNORMAL HIGH (ref 70–99)
Potassium: 3.7 mmol/L (ref 3.5–5.1)
Sodium: 136 mmol/L (ref 135–145)

## 2023-06-27 LAB — CBC
HCT: 39.8 % (ref 36.0–46.0)
Hemoglobin: 13.4 g/dL (ref 12.0–15.0)
MCH: 31.5 pg (ref 26.0–34.0)
MCHC: 33.7 g/dL (ref 30.0–36.0)
MCV: 93.4 fL (ref 80.0–100.0)
Platelets: 277 10*3/uL (ref 150–400)
RBC: 4.26 MIL/uL (ref 3.87–5.11)
RDW: 12.6 % (ref 11.5–15.5)
WBC: 6.6 10*3/uL (ref 4.0–10.5)
nRBC: 0 % (ref 0.0–0.2)

## 2023-06-27 LAB — TROPONIN I (HIGH SENSITIVITY): Troponin I (High Sensitivity): 3 ng/L (ref <18)

## 2023-06-27 NOTE — ED Notes (Signed)
Blue top sent. 

## 2023-06-27 NOTE — ED Triage Notes (Addendum)
Pt to ed from home via POV for SOB x 45 mins. Pt has HX of anxiety and panic attacks and takes PRN xanax and took this at time of initial SOB. Pt advised it is not working. Pt is caox4, in distress in triage. Pt is breathing rapidly and her face is red. Oxygen sat is 99% on RA. Pt denies any recent illness or sickness. Pt has been seen multiple times for same in the past.

## 2023-06-28 ENCOUNTER — Emergency Department
Admission: EM | Admit: 2023-06-28 | Discharge: 2023-06-28 | Discharge status: Against Medical Advice | Payer: 59 | Attending: Emergency Medicine | Admitting: Emergency Medicine

## 2023-06-28 NOTE — ED Notes (Signed)
Pt called again for treatment room, no answer.

## 2023-06-28 NOTE — ED Notes (Signed)
Called multiple times for repeats. No answer.

## 2023-07-02 ENCOUNTER — Telehealth: Payer: Self-pay

## 2023-07-06 NOTE — Telephone Encounter (Signed)
On 07/02/23 did first attempt to reach pt and left v/m requesting cb.

## 2023-07-06 NOTE — Transitions of Care (Post Inpatient/ED Visit) (Signed)
Unable to reach patient by phone and no recording to leave v/m. Pt left [facility] ED on 06/28/23 without being seen. Sending note to Dr Kerby Nora who is out of office and [facility] pool.       07/06/2023  Name: Rebecca Allen MRN: [id number] DOB: 1965/10/22  Today's TOC FU Call Status: Today's TOC FU Call Status:: Unsuccessful Call (2nd Attempt) Unsuccessful Call (2nd Attempt) Date: 07/06/23  Attempted to reach the patient regarding the most recent Inpatient/ED visit.  Follow Up Plan: No further outreach attempts will be made at this time. We have been unable to contact the patient.  Signature Lewanda Rife, LPN

## 2023-07-15 ENCOUNTER — Other Ambulatory Visit: Payer: Self-pay | Admitting: Family Medicine

## 2023-07-15 MED ORDER — ALPRAZOLAM 0.5 MG PO TABS: 0.5000 mg | ORAL_TABLET | Freq: Every day | ORAL | 0 refills | Status: DC | PRN

## 2023-07-15 NOTE — Telephone Encounter (Signed)
Last office visit 06/16/2023 (video) with [facility] for cough.  Last refilled 05/12/2023 for #20 with no refills.  Next Appt: No future appointments.

## 2023-07-22 ENCOUNTER — Ambulatory Visit: Payer: 59 | Admitting: Family

## 2023-07-22 ENCOUNTER — Telehealth: Payer: Self-pay | Admitting: Family Medicine

## 2023-07-22 ENCOUNTER — Emergency Department
Admission: EM | Admit: 2023-07-22 | Discharge: 2023-07-22 | Disposition: A | Discharge status: Home / Self Care | Payer: 59 | Attending: Emergency Medicine | Admitting: Emergency Medicine

## 2023-07-22 ENCOUNTER — Emergency Department: Payer: 59

## 2023-07-22 ENCOUNTER — Other Ambulatory Visit: Payer: Self-pay

## 2023-07-22 ENCOUNTER — Encounter: Payer: Self-pay | Admitting: Emergency Medicine

## 2023-07-22 DIAGNOSIS — H6502 Acute serous otitis media, left ear: Secondary | ICD-10-CM | POA: Diagnosis not present

## 2023-07-22 DIAGNOSIS — R059 Cough, unspecified: Secondary | ICD-10-CM | POA: Diagnosis present

## 2023-07-22 DIAGNOSIS — R0989 Other specified symptoms and signs involving the circulatory and respiratory systems: Secondary | ICD-10-CM | POA: Diagnosis not present

## 2023-07-22 DIAGNOSIS — M791 Myalgia, unspecified site: Secondary | ICD-10-CM | POA: Insufficient documentation

## 2023-07-22 DIAGNOSIS — Z20822 Contact with and (suspected) exposure to covid-19: Secondary | ICD-10-CM | POA: Diagnosis not present

## 2023-07-22 DIAGNOSIS — R0982 Postnasal drip: Secondary | ICD-10-CM | POA: Insufficient documentation

## 2023-07-22 LAB — CBC WITH DIFFERENTIAL/PLATELET
Abs Immature Granulocytes: 0.01 10*3/uL (ref 0.00–0.07)
Basophils Absolute: 0 10*3/uL (ref 0.0–0.1)
Basophils Relative: 1 %
Eosinophils Absolute: 0.1 10*3/uL (ref 0.0–0.5)
Eosinophils Relative: 1 %
HCT: 42.7 % (ref 36.0–46.0)
Hemoglobin: 14.1 g/dL (ref 12.0–15.0)
Immature Granulocytes: 0 %
Lymphocytes Relative: 40 %
Lymphs Abs: 2.1 10*3/uL (ref 0.7–4.0)
MCH: 30.7 pg (ref 26.0–34.0)
MCHC: 33 g/dL (ref 30.0–36.0)
MCV: 93 fL (ref 80.0–100.0)
Monocytes Absolute: 0.3 10*3/uL (ref 0.1–1.0)
Monocytes Relative: 6 %
Neutro Abs: 2.7 10*3/uL (ref 1.7–7.7)
Neutrophils Relative %: 52 %
Platelets: 277 10*3/uL (ref 150–400)
RBC: 4.59 MIL/uL (ref 3.87–5.11)
RDW: 12.2 % (ref 11.5–15.5)
WBC: 5.3 10*3/uL (ref 4.0–10.5)
nRBC: 0 % (ref 0.0–0.2)

## 2023-07-22 LAB — BASIC METABOLIC PANEL
Anion gap: 9 (ref 5–15)
BUN: 19 mg/dL (ref 6–20)
CO2: 23 mmol/L (ref 22–32)
Calcium: 8.9 mg/dL (ref 8.9–10.3)
Chloride: 102 mmol/L (ref 98–111)
Creatinine, Ser: 0.72 mg/dL (ref 0.44–1.00)
GFR, Estimated: >60 mL/min (ref >60)
Glucose, Bld: 95 mg/dL (ref 70–99)
Potassium: 3.7 mmol/L (ref 3.5–5.1)
Sodium: 134 mmol/L — ABNORMAL LOW (ref 135–145)

## 2023-07-22 LAB — RESP PANEL BY RT-PCR (RSV, FLU A&B, COVID)  RVPGX2
Influenza A by PCR: NEGATIVE
Influenza B by PCR: NEGATIVE
Resp Syncytial Virus by PCR: NEGATIVE
SARS Coronavirus 2 by RT PCR: NEGATIVE

## 2023-07-22 LAB — TROPONIN I (HIGH SENSITIVITY): Troponin I (High Sensitivity): <2 ng/L (ref <18)

## 2023-07-22 NOTE — Telephone Encounter (Addendum)
FYI: This call has been transferred to Access Nurse. Once the result note has been entered staff can address the message at that time.  Patient called in with the following symptoms:  Red Word:chest pain, wheezing    Please advise at Mobile [phone] (mobile)  Message is routed to Provider Pool and [facility] Triage   Pt called requesting an appointment with Dr. Ermalene Searing for chest pain, congestion, wheezing, light headedness. Pt states she also been feeling drowsy as well. Only slots available for today was this afternoon with Dr. Alphonsus Sias, pt requested to be seen sooner, no morning slots available. Scheduled pt with Rennie Plowman, NP @ [facility] for today, 12/12 @ 12pm. Transferred to access nurse. Sending note to [facility] as well.

## 2023-07-22 NOTE — Telephone Encounter (Signed)
I spoke with pt and she is going to [facility] ED now and request appt at [facility] be cancelled for 07/22/23; done. Sending FYI to Dr Ermalene Searing.

## 2023-07-22 NOTE — ED Provider Notes (Signed)
[facility] Provider Note   Event Date/Time   First MD Initiated Contact with Patient 07/22/23 1149     (approximate) History  Cough, Generalized Body Aches, and Chest Pain  HPI Rebecca Allen is a 57 y.o. female who presents complaining of 3 days of cough, congestion, runny nose, and bodyaches.  Patient states that she had fever initially now is not having fevers at this time.  Patient states that she has been using over-the-counter decongestants for the symptoms that have only moderately improved.  Patient denies any recent travel or sick contacts. ROS: Patient currently denies any vision changes, tinnitus, difficulty speaking, facial droop, sore throat, chest pain, abdominal pain, nausea/vomiting/diarrhea, dysuria, or weakness/numbness/paresthesias in any extremity   Physical Exam  Triage Vital Signs: ED Triage Vitals  Encounter Vitals Group     BP 07/22/23 1005 115/62     Systolic BP Percentile --      Diastolic BP Percentile --      Pulse Rate 07/22/23 1005 70     Resp 07/22/23 1005 16     Temp 07/22/23 1005 98 F (36.7 C)     Temp Source 07/22/23 1005 Oral     SpO2 07/22/23 1005 97 %     Weight 07/22/23 1003 163 lb (73.9 kg)     Height 07/22/23 1003 5\' 5"  (1.651 m)     Head Circumference --      Peak Flow --      Pain Score 07/22/23 1002 10     Pain Loc --      Pain Education --      Exclude from Growth Chart --    Most recent vital signs: Vitals:   07/22/23 1005  BP: 115/62  Pulse: 70  Resp: 16  Temp: 98 F (36.7 C)  SpO2: 97%   General: Awake, oriented x4. CV:  Good peripheral perfusion.  Resp:  Normal effort.  Abd:  No distention.  Other:  Middle-aged overweight Caucasian female resting comfortably in no acute distress.  Erythematous posterior oropharynx.  Anterior cervical lymphadenopathy.  Left tympanic membrane erythematous with clear fluid behind and no bulging ED Results / Procedures / Treatments  Labs (all labs ordered  are listed, but only abnormal results are displayed) Labs Reviewed  BASIC METABOLIC PANEL - Abnormal; Notable for the following components:      Result Value   Sodium 134 (*)    All other components within normal limits  RESP PANEL BY RT-PCR (RSV, FLU A&B, COVID)  [id number]  CBC WITH DIFFERENTIAL/PLATELET  TROPONIN I (HIGH SENSITIVITY)   RADIOLOGY ED MD interpretation: 2 view chest x-ray interpreted by me shows no evidence of acute abnormalities including no pneumonia, pneumothorax, or widened mediastinum -Agree with radiology assessment Official radiology report(s): DG Chest 2 View Result Date: 07/22/2023 CLINICAL DATA:  Cough, chest pain. EXAM: CHEST - 2 VIEW COMPARISON:  June 27, 2023. FINDINGS: The heart size and mediastinal contours are within normal limits. Both lungs are clear. The visualized skeletal structures are unremarkable. IMPRESSION: No active cardiopulmonary disease. Electronically Signed   By: Lupita Raider M.D.   On: 07/22/2023 10:52   PROCEDURES: Critical Care performed: No Procedures MEDICATIONS ORDERED IN ED: Medications - No data to display IMPRESSION / MDM / ASSESSMENT AND PLAN / ED COURSE  I reviewed the triage vital signs and the nursing notes.  The patient is on the cardiac monitor to evaluate for evidence of arrhythmia and/or significant heart rate changes. [name] presentation is most consistent with acute presentation with potential threat to life or bodily function. Otherwise healthy patient presenting with constellation of symptoms likely representing uncomplicated viral upper respiratory symptoms as characterized by mild pharyngitis  Unlikely PTA/RPA: no hot potato voice, no uvular deviation, Unlikely Esophageal rupture: No history of dysphagia Unlikely deep space infection/Ludwig's Low suspicion for CNS infection bacterial sinusitis, or pneumonia given exam and history.  Unlikely Strep or EBV as centor negative and  with no pharyngeal exudate, posterior LAD, or splenomegaly.  Will attempt to alleviate symptoms conservatively; no overt indications at this time for antibiotics. No respiratory distress, otherwise relatively well appearing and nontoxic. Will discuss prompt follow up with PMD and strict return precautions.   FINAL CLINICAL IMPRESSION(S) / ED DIAGNOSES   Final diagnoses:  Chest congestion  Postnasal drip  Non-recurrent acute serous otitis media of left ear   Rx / DC Orders   ED Discharge Orders     None      Note:  This document was prepared using Dragon voice recognition software and may include unintentional dictation errors.   Merwyn Katos, MD 07/22/23 [phone]

## 2023-07-22 NOTE — Discharge Instructions (Addendum)
Please continue using nasal and chest decongestant such as phenylephrine and pseudoephedrine/guaifenesin for continued congestive symptoms in the nasal sinuses and chest congestion

## 2023-07-22 NOTE — ED Provider Triage Note (Signed)
Emergency Medicine Provider Triage Evaluation Note  Rebecca Allen , a 57 y.o. female  was evaluated in triage.  Pt complains of fever, cough, and ear pain since Sunday, coughing with chest congestion that began this morning. Reports body aches everywhere. Taking Dayquil. Reports nauseous without vomiting. No fever x2 days.   Review of Systems  Positive: Cough, congestion, body aches Negative: Abd pain  Physical Exam  There were no vitals taken for this visit. Gen:   Awake, no distress   Resp:  Normal effort  MSK:   Moves extremities without difficulty  Other:    Medical Decision Making  Medically screening exam initiated at 10:00 AM.  Appropriate orders placed.  Rebecca Allen was informed that the remainder of the evaluation will be completed by another provider, this initial triage assessment does not replace that evaluation, and the importance of remaining in the ED until their evaluation is complete.     Jackelyn Hoehn, PA-C 07/22/23 1002

## 2023-07-22 NOTE — Telephone Encounter (Signed)
Noted  

## 2023-07-22 NOTE — ED Triage Notes (Signed)
Pt to ED via POV c/o chest pain and dizziness that started this morning. Pt states that on Sunday she started with ear pain, cough, and fever. Pt states that she has not had fever in 2 days. Pt reports that she feels achy all over.

## 2023-08-03 ENCOUNTER — Encounter: Payer: Self-pay | Admitting: Internal Medicine

## 2023-08-03 ENCOUNTER — Ambulatory Visit (INDEPENDENT_AMBULATORY_CARE_PROVIDER_SITE_OTHER): Payer: 59 | Admitting: Internal Medicine

## 2023-08-03 VITALS — Temp 101.5°F

## 2023-08-03 DIAGNOSIS — J988 Other specified respiratory disorders: Secondary | ICD-10-CM | POA: Diagnosis not present

## 2023-08-03 MED ORDER — DOXYCYCLINE HYCLATE 100 MG PO TABS: 100.0000 mg | ORAL_TABLET | Freq: Two times a day (BID) | ORAL | 1 refills | Status: DC

## 2023-08-03 MED ORDER — HYDROCODONE BIT-HOMATROP MBR 5-1.5 MG/5ML PO SOLN: 5.0000 mL | Freq: Every evening | ORAL | 0 refills | Status: DC | PRN

## 2023-08-03 NOTE — Progress Notes (Signed)
Subjective:    Patient ID: Rebecca Allen, female    DOB: 02-01-1966, 57 y.o.   MRN: [id number]  HPI Video virtual visit due to respiratory infection Identification done Reviewed limitations and billing and she gave consent Participants--patient at store and I am in my office  Started yesterday Had been in [place] and exposed to family with bronchits and strep Bad sore throat, pain with cough, didn't sleep well Had fever last night--but down today with ibuprofen Not really SOB Some frontal headache---and soreness over maxilla  Not usually prone to sinus infections  Drinking warm tea  Tested for COVID --negative  Was seen ~ 12 days ago in ER for respiratory infection Seemed to ease up--but persistent ear pain on left CXR was negative then ----not much cough or sore throat then  Current Outpatient Medications on File Prior to Visit  Medication Sig Dispense Refill   ALPRAZolam (XANAX) 0.5 MG tablet Take 1 tablet (0.5 mg total) by mouth daily as needed for anxiety. 20 tablet 0   aspirin 81 MG chewable tablet Chew 81 mg by mouth daily.     betamethasone dipropionate 0.05 % cream Apply topically 2 (two) times daily. 30 g 0   calcium carbonate (TUMS - DOSED IN MG ELEMENTAL CALCIUM) 500 MG chewable tablet Chew 6 tablets by mouth daily as needed for indigestion or heartburn.     clobetasol ointment (TEMOVATE) 0.05 % Apply topically 2 (two) times daily as needed.     estradiol (VIVELLE-DOT) 0.05 MG/24HR patch PLACE 1 PATCH TRANS-DERMALLY TWICE A WEEK. REMOVE OLD PATCH BEFORE APPLYING NEW PATCH. 24 patch 0   ondansetron (ZOFRAN) 4 MG tablet Take 1 tablet (4 mg total) by mouth every 8 (eight) hours as needed for nausea or vomiting. 20 tablet 0   oxyCODONE-acetaminophen (PERCOCET) 5-325 MG tablet Take 1 tablet by mouth every 6 (six) hours as needed for severe pain. 8 tablet 0   pantoprazole (PROTONIX) 40 MG tablet Take 1 tablet (40 mg total) by mouth daily. 90 tablet 1   prednisoLONE  acetate (PRED FORTE) 1 % ophthalmic suspension 1 drop 4 (four) times daily.     tamsulosin (FLOMAX) 0.4 MG CAPS capsule Take 1 capsule (0.4 mg total) by mouth daily. 15 capsule 0   No current facility-administered medications on file prior to visit.    Allergies  Allergen Reactions   Metoclopramide Hcl     REACTION: Nervousness   Morphine And Codeine Nausea Only   Venlafaxine Other (See Comments)    Hot flashes, clammy and nausea    Past Medical History:  Diagnosis Date   Anxiety    Depression    GERD (gastroesophageal reflux disease)    Grief at loss of child 2022   History of kidney stones    Hyperlipidemia    Kidney stone    Pneumonia    Treadmill stress test negative for angina pectoris 12/18/2010    Past Surgical History:  Procedure Laterality Date   ABDOMINAL HYSTERECTOMY  2001   with 1 ovary removed; remaining over removed a yr later   CHOLECYSTECTOMY  2002   COLONOSCOPY     EXTRACORPOREAL SHOCK WAVE LITHOTRIPSY Left 06/27/2020   Procedure: EXTRACORPOREAL SHOCK WAVE LITHOTRIPSY (ESWL);  Surgeon: Riki Altes, MD;  Location: [facility] ORS;  Service: Urology;  Laterality: Left;   UPPER GI ENDOSCOPY  2019    Family History  Problem Relation Age of Onset   COPD Mother    Heart attack Father 56   Hypertension  Father    Heart disease Father        stent placement    Congenital heart disease Sister    Leukemia Brother    Anxiety disorder Maternal Uncle    Alcohol abuse Maternal Uncle    Kidney failure Maternal Grandfather    COPD Paternal Grandmother    Breast cancer Neg Hx     Social History   Socioeconomic History   Marital status: Married    Spouse name: [name]   Number of children: Not on file   Years of education: Not on file   Highest education level: Not on file  Occupational History   Occupation: Medical laboratory scientific officer: [employer]    Comment: Urgent Care in [place]  Tobacco Use   Smoking status: Some Days    Types: E-cigarettes    Smokeless tobacco: Never  Vaping Use   Vaping status: Every Day  Substance and Sexual Activity   Alcohol use: Yes    Alcohol/week: 12.0 standard drinks of alcohol    Types: 12 Cans of beer per week    Comment: only on weekends. and liquor sometimes   Drug use: No   Sexual activity: Yes    Birth control/protection: Surgical    Comment: Hsyterectomy  Other Topics Concern   Not on file  Social History Narrative   Regular exercise- yes, occ walking   Diet- fruits and veggies, water    Social Drivers of Health   Financial Resource Strain: Not on file  Food Insecurity: Not on file  Transportation Needs: Not on file  Physical Activity: Not on file  Stress: Not on file  Social Connections: Not on file  Intimate Partner Violence: Not on file   Review of Systems No nausea. Vomited yesterday Appetite off--not really eating    Objective:   Physical Exam Constitutional:      Appearance: Normal appearance.  Pulmonary:     Effort: Pulmonary effort is normal. No respiratory distress.  Neurological:     Mental Status: She is alert.            Assessment & Plan:

## 2023-08-03 NOTE — Assessment & Plan Note (Signed)
Seems to have had something almost 2 weeks Not typical age for atypical infection but possible Could just be sinus infection Will treat with doxy 100 bid x 7 days (with refill) Hycodan cough syrup analgesics

## 2023-08-05 ENCOUNTER — Telehealth: Payer: Self-pay | Admitting: *Deleted

## 2023-08-05 MED ORDER — AMOXICILLIN-POT CLAVULANATE 875-125 MG PO TABS
1.0000 | ORAL_TABLET | Freq: Two times a day (BID) | ORAL | 0 refills | Status: DC
Start: 2023-08-05 — End: 2023-09-03

## 2023-08-05 NOTE — Telephone Encounter (Signed)
I have tried to call pt twice and VM seems to be messed up. Will send a MyChart message.

## 2023-08-05 NOTE — Telephone Encounter (Signed)
Copied from CRM [id number]. Topic: Clinical - Medication Question >> Aug 05, 2023 10:35 AM Almira Coaster wrote: Reason for CRM: Patient was seen on Monday and prescribed doxycycline (VIBRA-TABS) 100 MG tablet that she started taking but they're not working, she feels like her symptoms have gotten worse and wanted to know if we would be able to send something stronger or an alternative medication.

## 2023-08-05 NOTE — Telephone Encounter (Signed)
Please let her know I sent a different antibiotic I think she should still finish out the doxycycline  Make sure she knows to take the augmentin on a full stomach

## 2023-08-09 ENCOUNTER — Other Ambulatory Visit: Payer: Self-pay | Admitting: Internal Medicine

## 2023-08-09 NOTE — Telephone Encounter (Signed)
Last office visit 08/03/2023 with Dr. Alphonsus Sias for respiratory infection.  Last refilled 08/03/23 for 120 ml with no refills by Dr. Alphonsus Sias.  No future appointments.

## 2023-08-10 ENCOUNTER — Ambulatory Visit: Payer: 59 | Admitting: Nurse Practitioner

## 2023-08-10 ENCOUNTER — Other Ambulatory Visit: Payer: Self-pay | Admitting: Family Medicine

## 2023-08-10 ENCOUNTER — Telehealth: Payer: Self-pay | Admitting: Family Medicine

## 2023-08-10 VITALS — BP 116/82 | HR 79 | Temp 97.9°F | Ht 65.0 in | Wt 174.6 lb

## 2023-08-10 DIAGNOSIS — R0602 Shortness of breath: Secondary | ICD-10-CM | POA: Insufficient documentation

## 2023-08-10 DIAGNOSIS — R051 Acute cough: Secondary | ICD-10-CM

## 2023-08-10 DIAGNOSIS — Z79890 Hormone replacement therapy (postmenopausal): Secondary | ICD-10-CM

## 2023-08-10 MED ORDER — HYDROCODONE BIT-HOMATROP MBR 5-1.5 MG/5ML PO SOLN: 5.0000 mL | Freq: Two times a day (BID) | ORAL | 0 refills | Status: DC | PRN

## 2023-08-10 MED ORDER — PREDNISONE 20 MG PO TABS: ORAL_TABLET | ORAL | 0 refills | Status: AC

## 2023-08-10 NOTE — Assessment & Plan Note (Signed)
Will send in prednisone 20 mg taper.  Prednisone precautions discussed

## 2023-08-10 NOTE — Telephone Encounter (Signed)
Contacted pharmacy.   Pharmacy staff stated that the previous script was a 120 quantity , 5mL at bedtime. Pharmacy staff stated that she would have had to take the medication 3x a day to be out. Also states that they are unable to refill at this time.

## 2023-08-10 NOTE — Telephone Encounter (Signed)
 Pt called stating [facility] says they aren't going to refill HYDROcodone  bit-homatropine (HYCODAN) 5-1.5 MG/5ML syrup, due to them giving the pt a 24 day supply, some time before christmas. Pt states she couldn't remember the exact date the meds was given to her. Pt states she doesn't believe the bottle of meds was a 24 day supply, due to the bottle being very small. Please advise. Call back # [phone]

## 2023-08-10 NOTE — Assessment & Plan Note (Signed)
Patient was on doxycycline and then switched to Augmentin due to intolerance.  Continue Augmentin as prescribed refill of hydrocodone cough syrup sent to pharmacy.  Sedation precautions reviewed

## 2023-08-10 NOTE — Patient Instructions (Signed)
Nice to see you today I have sent in some steroids and refilled the cough medication  The cough medication can make you sleepy, so use caution  Follow up if you do not improve

## 2023-08-10 NOTE — Progress Notes (Signed)
 Acute Office Visit  Subjective:     Patient ID: Rebecca Allen, female    DOB: 1966/07/02, 57 y.o.   MRN: [id number]  Chief Complaint  Patient presents with   Pneumonia    Pt complains of symptoms worsening. Pt states she is having trouble breathing, consistent cough. Pt ran out of the cough syrup.      Patient is in today for cough with a history of migraines, GERD, GAD, HRT, and smoker   Was seen on 07/21/2024 at the ed.  He was seen by Dr. Jimmy on 08/03/2023 and was givne Doxy and cough medicaitons. Had a reaction to doxy and switched to augementin which she is currently still on.  Patient states she used all of her cough medication and would like a refill  States that she has not improved any.  States that she has had no sick contacts.   Review of Systems  Constitutional:  Positive for malaise/fatigue. Negative for chills and fever.  HENT:  Negative for sore throat.   Respiratory:  Positive for cough, sputum production (dark), shortness of breath and wheezing.   Gastrointestinal:  Negative for abdominal pain, nausea and vomiting.  Musculoskeletal:  Positive for myalgias.  Neurological:  Negative for headaches.        Objective:    BP 116/82   Pulse 79   Temp 97.9 F (36.6 C) (Oral)   Ht 5' 5 (1.651 m)   Wt 174 lb 9.6 oz (79.2 kg)   SpO2 98%   BMI 29.05 kg/m    Physical Exam Vitals and nursing note reviewed.  Constitutional:      Appearance: Normal appearance.  HENT:     Right Ear: Tympanic membrane, ear canal and external ear normal.     Left Ear: Ear canal and external ear normal.     Ears:     Comments: Lear fluid behind left TM    Mouth/Throat:     Mouth: Mucous membranes are moist.     Pharynx: Oropharynx is clear.  Cardiovascular:     Rate and Rhythm: Normal rate and regular rhythm.     Heart sounds: Normal heart sounds.  Pulmonary:     Effort: Pulmonary effort is normal.     Breath sounds: Normal breath sounds.  Lymphadenopathy:      Cervical: No cervical adenopathy.  Neurological:     Mental Status: She is alert.     No results found for any visits on 08/10/23.      Assessment & Plan:   Problem List Items Addressed This Visit       Other   Acute cough - Primary   Patient was on doxycycline  and then switched to Augmentin  due to intolerance.  Continue Augmentin  as prescribed refill of hydrocodone  cough syrup sent to pharmacy.  Sedation precautions reviewed      Relevant Medications   predniSONE  (DELTASONE ) 20 MG tablet   HYDROcodone  bit-homatropine (HYCODAN) 5-1.5 MG/5ML syrup   Shortness of breath   Will send in prednisone  20 mg taper.  Prednisone  precautions discussed      Relevant Medications   predniSONE  (DELTASONE ) 20 MG tablet    Meds ordered this encounter  Medications   predniSONE  (DELTASONE ) 20 MG tablet    Sig: Take 2 tablets (40 mg total) by mouth daily with breakfast for 3 days, THEN 1 tablet (20 mg total) daily with breakfast for 3 days. Avoid NSAIDs.    Dispense:  9 tablet    Refill:  0    Supervising Provider:   RANDEEN HARDY A [[id number]]   HYDROcodone  bit-homatropine (HYCODAN) 5-1.5 MG/5ML syrup    Sig: Take 5 mLs by mouth 2 (two) times daily as needed for cough.    Dispense:  100 mL    Refill:  0    Supervising Provider:   RANDEEN HARDY A [[id number]]    Return if symptoms worsen or fail to improve.  [name], NP

## 2023-08-10 NOTE — Telephone Encounter (Signed)
Contacted pt.  Advised pt to use OTC delsym for cough per PCP that she saw during OV. Stated to give our office a call if symptoms do not change in the following weeks. Pt verbalized understanding and has no questions or concerns at this time.

## 2023-08-10 NOTE — Telephone Encounter (Signed)
Noted. She can take over the counter delsym for her cough

## 2023-09-02 ENCOUNTER — Other Ambulatory Visit: Payer: Self-pay | Admitting: Family Medicine

## 2023-09-03 ENCOUNTER — Encounter: Payer: Self-pay | Admitting: Internal Medicine

## 2023-09-03 ENCOUNTER — Ambulatory Visit: Payer: 59 | Admitting: Internal Medicine

## 2023-09-03 VITALS — BP 100/60 | HR 81 | Temp 98.2°F | Ht 65.0 in | Wt 171.0 lb

## 2023-09-03 DIAGNOSIS — M546 Pain in thoracic spine: Secondary | ICD-10-CM

## 2023-09-03 MED ORDER — TIZANIDINE HCL 2 MG PO TABS: 2.0000 mg | ORAL_TABLET | Freq: Three times a day (TID) | ORAL | 0 refills | Status: DC | PRN

## 2023-09-03 MED ORDER — MELOXICAM 15 MG PO TABS: 15.0000 mg | ORAL_TABLET | Freq: Every day | ORAL | 0 refills | Status: DC

## 2023-09-03 MED ORDER — ALPRAZOLAM 0.5 MG PO TABS: 0.5000 mg | ORAL_TABLET | Freq: Every day | ORAL | 0 refills | Status: DC | PRN

## 2023-09-03 NOTE — Assessment & Plan Note (Signed)
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

## 2023-09-03 NOTE — Telephone Encounter (Signed)
Last OV: 12/25/22 Pending OV: Nothing scheduled Medication: Alprazolam 0.5mg  Directions: Take one tablet daily prn Last Refill: 07/15/23 Qty: #20 with 0 refills

## 2023-09-03 NOTE — Progress Notes (Signed)
Subjective:    Patient ID: Rebecca Allen, female    DOB: [birth year], 58 y.o.   MRN: [id number]  HPI Here due to back pain  Started yesterday---had moved some chairs and things on top cabinet yesterday (doing cleaning) Fairly heavy items Also picking up grandson  Also slipped on ice 5 days ago---had to grab hard onto husband's truck (no problems after that) Then with bad pain in mid back-a few hours later Started with throbbing---so tried some stretching Got worse and tried heating pad--no help Then tried ice also--didn't help Tylenol 975 then later ibuprofen 400---didn't help Tried heat again  Current Outpatient Medications on File Prior to Visit  Medication Sig Dispense Refill   ALPRAZolam (XANAX) 0.5 MG tablet Take 1 tablet (0.5 mg total) by mouth daily as needed for anxiety. 20 tablet 0   aspirin 81 MG chewable tablet Chew 81 mg by mouth daily.     betamethasone dipropionate 0.05 % cream Apply topically 2 (two) times daily. 30 g 0   calcium carbonate (TUMS - DOSED IN MG ELEMENTAL CALCIUM) 500 MG chewable tablet Chew 6 tablets by mouth daily as needed for indigestion or heartburn.     clobetasol ointment (TEMOVATE) 0.05 % Apply topically 2 (two) times daily as needed.     estradiol (VIVELLE-DOT) 0.05 MG/24HR patch PLACE 1 PATCH TRANS-DERMALLY TWICE A WEEK. REMOVE OLD PATCH BEFORE APPLYING NEW PATCH. 24 patch 0   ondansetron (ZOFRAN) 4 MG tablet Take 1 tablet (4 mg total) by mouth every 8 (eight) hours as needed for nausea or vomiting. 20 tablet 0   pantoprazole (PROTONIX) 40 MG tablet Take 1 tablet (40 mg total) by mouth daily. 90 tablet 1   tamsulosin (FLOMAX) 0.4 MG CAPS capsule Take 1 capsule (0.4 mg total) by mouth daily. 15 capsule 0   No current facility-administered medications on file prior to visit.    Allergies  Allergen Reactions   Metoclopramide Hcl     REACTION: Nervousness   Morphine And Codeine Nausea Only   Venlafaxine Other (See Comments)    Hot  flashes, clammy and nausea    Past Medical History:  Diagnosis Date   Anxiety    Depression    GERD (gastroesophageal reflux disease)    Grief at loss of child 2022   History of kidney stones    Hyperlipidemia    Kidney stone    Pneumonia    Treadmill stress test negative for angina pectoris 12/18/2010    Past Surgical History:  Procedure Laterality Date   ABDOMINAL HYSTERECTOMY  2001   with 1 ovary removed; remaining over removed a yr later   CHOLECYSTECTOMY  2002   COLONOSCOPY     EXTRACORPOREAL SHOCK WAVE LITHOTRIPSY Left 06/27/2020   Procedure: EXTRACORPOREAL SHOCK WAVE LITHOTRIPSY (ESWL);  Surgeon: Riki Altes, MD;  Location: [facility] ORS;  Service: Urology;  Laterality: Left;   UPPER GI ENDOSCOPY  2019    Family History  Problem Relation Age of Onset   COPD Mother    Heart attack Father 37   Hypertension Father    Heart disease Father        stent placement    Congenital heart disease Sister    Leukemia Brother    Anxiety disorder Maternal Uncle    Alcohol abuse Maternal Uncle    Kidney failure Maternal Grandfather    COPD Paternal Grandmother    Breast cancer Neg Hx     Social History   Socioeconomic History   Marital  status: Married    Spouse name: [name]   Number of children: Not on file   Years of education: Not on file   Highest education level: Not on file  Occupational History   Occupation: Scientist, forensic    Employer: [employer]    Comment: Urgent Care in [place]  Tobacco Use   Smoking status: Some Days    Types: E-cigarettes   Smokeless tobacco: Never  Vaping Use   Vaping status: Every Day  Substance and Sexual Activity   Alcohol use: Yes    Alcohol/week: 12.0 standard drinks of alcohol    Types: 12 Cans of beer per week    Comment: only on weekends. and liquor sometimes   Drug use: No   Sexual activity: Yes    Birth control/protection: Surgical    Comment: Hsyterectomy  Other Topics Concern   Not on file  Social History  Narrative   Regular exercise- yes, occ walking   Diet- fruits and veggies, water    Social Drivers of Health   Financial Resource Strain: Not on file  Food Insecurity: Not on file  Transportation Needs: Not on file  Physical Activity: Not on file  Stress: Not on file  Social Connections: Not on file  Intimate Partner Violence: Not on file   Review of Systems Slight nausea but no vomiting Not eating much No dysuria or urinary changes    Objective:   Physical Exam Musculoskeletal:     Cervical back: Normal range of motion and neck supple.     Comments: Shoulders move fine ---full ROM except slight pain with full internal rotation on right  Marked tenderness across lower thoracic area on both sides--and also laterally in both lumbar areas No sig spine tenderness SLR negative Hip ROM is fine            Assessment & Plan:

## 2023-09-05 ENCOUNTER — Emergency Department: Payer: 59

## 2023-09-05 ENCOUNTER — Other Ambulatory Visit: Payer: Self-pay

## 2023-09-05 ENCOUNTER — Encounter: Payer: Self-pay | Admitting: Emergency Medicine

## 2023-09-05 ENCOUNTER — Emergency Department
Admission: EM | Admit: 2023-09-05 | Discharge: 2023-09-05 | Disposition: A | Discharge status: Home / Self Care | Payer: 59 | Attending: Emergency Medicine | Admitting: Emergency Medicine

## 2023-09-05 DIAGNOSIS — R109 Unspecified abdominal pain: Secondary | ICD-10-CM | POA: Diagnosis present

## 2023-09-05 LAB — COMPREHENSIVE METABOLIC PANEL
ALT: 23 U/L (ref 0–44)
AST: 22 U/L (ref 15–41)
Albumin: 4.8 g/dL (ref 3.5–5.0)
Alkaline Phosphatase: 43 U/L (ref 38–126)
Anion gap: 13 (ref 5–15)
BUN: 20 mg/dL (ref 6–20)
CO2: 25 mmol/L (ref 22–32)
Calcium: 10.1 mg/dL (ref 8.9–10.3)
Chloride: 100 mmol/L (ref 98–111)
Creatinine, Ser: 0.8 mg/dL (ref 0.44–1.00)
GFR, Estimated: >60 mL/min (ref >60)
Glucose, Bld: 96 mg/dL (ref 70–99)
Potassium: 4.1 mmol/L (ref 3.5–5.1)
Sodium: 138 mmol/L (ref 135–145)
Total Bilirubin: 0.5 mg/dL (ref 0.0–1.2)
Total Protein: 7.7 g/dL (ref 6.5–8.1)

## 2023-09-05 LAB — CBC WITH DIFFERENTIAL/PLATELET
Abs Immature Granulocytes: 0.01 10*3/uL (ref 0.00–0.07)
Basophils Absolute: 0 10*3/uL (ref 0.0–0.1)
Basophils Relative: 0 %
Eosinophils Absolute: 0.1 10*3/uL (ref 0.0–0.5)
Eosinophils Relative: 1 %
HCT: 40.7 % (ref 36.0–46.0)
Hemoglobin: 13.6 g/dL (ref 12.0–15.0)
Immature Granulocytes: 0 %
Lymphocytes Relative: 36 %
Lymphs Abs: 2.8 10*3/uL (ref 0.7–4.0)
MCH: 31 pg (ref 26.0–34.0)
MCHC: 33.4 g/dL (ref 30.0–36.0)
MCV: 92.7 fL (ref 80.0–100.0)
Monocytes Absolute: 0.7 10*3/uL (ref 0.1–1.0)
Monocytes Relative: 9 %
Neutro Abs: 4.1 10*3/uL (ref 1.7–7.7)
Neutrophils Relative %: 54 %
Platelets: 292 10*3/uL (ref 150–400)
RBC: 4.39 MIL/uL (ref 3.87–5.11)
RDW: 12.6 % (ref 11.5–15.5)
WBC: 7.8 10*3/uL (ref 4.0–10.5)
nRBC: 0 % (ref 0.0–0.2)

## 2023-09-05 LAB — URINALYSIS, ROUTINE W REFLEX MICROSCOPIC
Bilirubin Urine: NEGATIVE
Glucose, UA: NEGATIVE mg/dL
Hgb urine dipstick: NEGATIVE
Ketones, ur: NEGATIVE mg/dL
Leukocytes,Ua: NEGATIVE
Nitrite: NEGATIVE
Protein, ur: NEGATIVE mg/dL
Specific Gravity, Urine: 1.021 (ref 1.005–1.030)
pH: 7 (ref 5.0–8.0)

## 2023-09-05 LAB — LIPASE, BLOOD: Lipase: 34 U/L (ref 11–51)

## 2023-09-05 MED ORDER — SODIUM CHLORIDE 0.9 % IV BOLUS
1000.0000 mL | Freq: Once | INTRAVENOUS | Status: AC
  Administered 2023-09-05: 1000 mL via INTRAVENOUS

## 2023-09-05 MED ORDER — OXYCODONE-ACETAMINOPHEN 5-325 MG PO TABS: 1.0000 | ORAL_TABLET | ORAL | 0 refills | Status: DC | PRN

## 2023-09-05 MED ORDER — OXYCODONE-ACETAMINOPHEN 5-325 MG PO TABS
1.0000 | ORAL_TABLET | Freq: Once | ORAL | Status: AC
  Administered 2023-09-05: 1 via ORAL
  Filled 2023-09-05: qty 1

## 2023-09-05 MED ORDER — HYDROMORPHONE HCL 1 MG/ML IJ SOLN
1.0000 mg | Freq: Once | INTRAMUSCULAR | Status: AC
  Administered 2023-09-05: 1 mg via INTRAVENOUS
  Filled 2023-09-05: qty 1

## 2023-09-05 MED ORDER — ONDANSETRON HCL 4 MG/2ML IJ SOLN
4.0000 mg | Freq: Once | INTRAMUSCULAR | Status: AC
  Administered 2023-09-05: 4 mg via INTRAVENOUS
  Filled 2023-09-05: qty 2

## 2023-09-05 NOTE — ED Provider Triage Note (Signed)
Emergency Medicine Provider Triage Evaluation Note  DERIKA ECKLES , a 58 y.o. female  was evaluated in triage.  Pt complains of right flank pain with nausea, vomiting, and dark urine. Symptoms started 2 days ago.   Physical Exam  There were no vitals taken for this visit. Gen:   Awake, no distress   Resp:  Normal effort  MSK:   Moves extremities without difficulty  Other:    Medical Decision Making  Medically screening exam initiated at 5:02 PM.  Appropriate orders placed.  Adwoa M Heatherington was informed that the remainder of the evaluation will be completed by another provider, this initial triage assessment does not replace that evaluation, and the importance of remaining in the ED until their evaluation is complete.    Chinita Pester, FNP 09/05/23 1704

## 2023-09-05 NOTE — Discharge Instructions (Addendum)
You can continue to take the Tylenol as needed for mild to moderate pain.  For severe pain you can take Percocet every 4-6 hours as needed.  Do not take Tylenol at the same time you are taking a Percocet as they both contain acetaminophen.  You can take the muscle relaxer as needed.  I also recommend using topical pain relievers as well as ice and heat.

## 2023-09-05 NOTE — ED Provider Notes (Signed)
[facility] Provider Note    Event Date/Time   First MD Initiated Contact with Patient 09/05/23 2006     (approximate)   History   Flank Pain   HPI  Rebecca Allen is a 58 y.o. female who presents for evaluation of right flank pain.  Patient was seen a couple days ago by her primary care provider and diagnosed with a muscle strain.  She was given muscle relaxers but does not like the way it makes her feel.  She has been taking Tylenol at home as ibuprofen causes GI upset.  Patient denies urinary symptoms.      Physical Exam   Triage Vital Signs: ED Triage Vitals  Encounter Vitals Group     BP 09/05/23 1703 (!) 134/96     Systolic BP Percentile --      Diastolic BP Percentile --      Pulse Rate 09/05/23 1703 75     Resp 09/05/23 1703 20     Temp 09/05/23 1703 98.1 F (36.7 C)     Temp src --      SpO2 09/05/23 1703 95 %     Weight 09/05/23 1702 171 lb (77.6 kg)     Height 09/05/23 1702 5\' 5"  (1.651 m)     Head Circumference --      Peak Flow --      Pain Score 09/05/23 1702 10     Pain Loc --      Pain Education --      Exclude from Growth Chart --     Most recent vital signs: Vitals:   09/05/23 1703 09/05/23 2100  BP: (!) 134/96 117/61  Pulse: 75 80  Resp: 20 18  Temp: 98.1 F (36.7 C) 98 F (36.7 C)  SpO2: 95% 100%    General: Awake, significant distress, unable to get comfortable, tearful. CV:  Good peripheral perfusion.  RRR. Resp:  Normal effort.  CTAB. Abd:  No distention.  Very tender to palpation over the right flank.    ED Results / Procedures / Treatments   Labs (all labs ordered are listed, but only abnormal results are displayed) Labs Reviewed  URINALYSIS, ROUTINE W REFLEX MICROSCOPIC - Abnormal; Notable for the following components:      Result Value   Color, Urine YELLOW (*)    APPearance CLOUDY (*)    All other components within normal limits  COMPREHENSIVE METABOLIC PANEL  CBC WITH  DIFFERENTIAL/PLATELET  LIPASE, BLOOD     RADIOLOGY  CT stone study obtained, interpreted the images as well as reviewed the radiologist report which was or any acute abnormalities.  No evidence of ureterolithiasis or hydronephrosis.   PROCEDURES:  Critical Care performed: No  Procedures   MEDICATIONS ORDERED IN ED: Medications  oxyCODONE-acetaminophen (PERCOCET/ROXICET) 5-325 MG per tablet 1 tablet (1 tablet Oral Given 09/05/23 1810)  HYDROmorphone (DILAUDID) injection 1 mg (1 mg Intravenous Given 09/05/23 2050)  ondansetron (ZOFRAN) injection 4 mg (4 mg Intravenous Given 09/05/23 2050)  sodium chloride 0.9 % bolus 1,000 mL (1,000 mLs Intravenous New Bag/Given 09/05/23 2051)     IMPRESSION / MDM / ASSESSMENT AND PLAN / ED COURSE  I reviewed the triage vital signs and the nursing notes.                             58 year old female presents for evaluation of right-sided flank pain.  Vital signs are stable the  patient was in significant pain on exam.  Differential diagnosis includes, but is not limited to, kidney stone, muscle strain, rib fracture, pancreatitis, cholecystitis.  [name] presentation is most consistent with acute complicated illness / injury requiring diagnostic workup.  CMP, CBC, lipase and urinalysis all WNL.  CT renal stone study was negative.  Given the essentially normal workup, I feel patient's pain is best attributed to a muscle strain.  We discussed using topical pain relievers like ice, heat and lidocaine patches.  I also recommended taking the muscle relaxer.  She can have Tylenol as needed and I will send to the few days of pain medication.  She voiced understanding, all questions were answered and she was stable at discharge.   FINAL CLINICAL IMPRESSION(S) / ED DIAGNOSES   Final diagnoses:  Flank pain     Rx / DC Orders   ED Discharge Orders          Ordered    oxyCODONE-acetaminophen (PERCOCET) 5-325 MG tablet  Every 4 hours PRN         09/05/23 2250             Note:  This document was prepared using Dragon voice recognition software and may include unintentional dictation errors.   Cameron Ali, PA-C 09/05/23 2252    Concha Se, MD 09/05/23 2337

## 2023-09-10 ENCOUNTER — Telehealth: Payer: Self-pay

## 2023-09-10 NOTE — Transitions of Care (Post Inpatient/ED Visit) (Signed)
Unable to reach patient by phone and left v/m requesting call back at [phone].        09/10/2023  Name: Rebecca Allen MRN: [id number] DOB: 01/06/1966  Today's TOC FU Call Status: Today's TOC FU Call Status:: Unsuccessful Call (1st Attempt) Unsuccessful Call (1st Attempt) Date: 09/10/23  Attempted to reach the patient regarding the most recent Inpatient/ED visit.  Follow Up Plan: Additional outreach attempts will be made to reach the patient to complete the Transitions of Care (Post Inpatient/ED visit) call.   Signature   Lewanda Rife, LPN

## 2023-09-30 ENCOUNTER — Other Ambulatory Visit: Payer: Self-pay | Admitting: Internal Medicine

## 2023-10-04 ENCOUNTER — Other Ambulatory Visit: Payer: Self-pay | Admitting: Family Medicine

## 2023-10-04 DIAGNOSIS — Z79890 Hormone replacement therapy (postmenopausal): Secondary | ICD-10-CM

## 2023-10-04 NOTE — Telephone Encounter (Signed)
 Please schedule CPE with fasting labs prior for Dr. Ermalene Searing after 11/03/2023.

## 2023-10-04 NOTE — Telephone Encounter (Signed)
 Call pt to schedule a appt and pt stated I'm at work and hung up

## 2023-10-15 ENCOUNTER — Ambulatory Visit: Admitting: Family Medicine

## 2023-10-24 ENCOUNTER — Emergency Department

## 2023-10-24 ENCOUNTER — Emergency Department
Admission: EM | Admit: 2023-10-24 | Discharge: 2023-10-24 | Disposition: A | Discharge status: Home / Self Care | Attending: Emergency Medicine | Admitting: Emergency Medicine

## 2023-10-24 ENCOUNTER — Other Ambulatory Visit: Payer: Self-pay

## 2023-10-24 DIAGNOSIS — R109 Unspecified abdominal pain: Secondary | ICD-10-CM

## 2023-10-24 DIAGNOSIS — R350 Frequency of micturition: Secondary | ICD-10-CM | POA: Diagnosis present

## 2023-10-24 LAB — BASIC METABOLIC PANEL
Anion gap: 8 (ref 5–15)
BUN: 15 mg/dL (ref 6–20)
CO2: 25 mmol/L (ref 22–32)
Calcium: 9.1 mg/dL (ref 8.9–10.3)
Chloride: 106 mmol/L (ref 98–111)
Creatinine, Ser: 0.77 mg/dL (ref 0.44–1.00)
GFR, Estimated: >60 mL/min (ref >60)
Glucose, Bld: 99 mg/dL (ref 70–99)
Potassium: 3.7 mmol/L (ref 3.5–5.1)
Sodium: 139 mmol/L (ref 135–145)

## 2023-10-24 LAB — CBC
HCT: 38.5 % (ref 36.0–46.0)
Hemoglobin: 12.7 g/dL (ref 12.0–15.0)
MCH: 30.4 pg (ref 26.0–34.0)
MCHC: 33 g/dL (ref 30.0–36.0)
MCV: 92.1 fL (ref 80.0–100.0)
Platelets: 273 10*3/uL (ref 150–400)
RBC: 4.18 MIL/uL (ref 3.87–5.11)
RDW: 13.2 % (ref 11.5–15.5)
WBC: 4.8 10*3/uL (ref 4.0–10.5)
nRBC: 0 % (ref 0.0–0.2)

## 2023-10-24 LAB — URINALYSIS, ROUTINE W REFLEX MICROSCOPIC
Bilirubin Urine: NEGATIVE
Glucose, UA: NEGATIVE mg/dL
Hgb urine dipstick: NEGATIVE
Ketones, ur: NEGATIVE mg/dL
Leukocytes,Ua: NEGATIVE
Nitrite: NEGATIVE
Protein, ur: NEGATIVE mg/dL
Specific Gravity, Urine: 1.021 (ref 1.005–1.030)
pH: 6 (ref 5.0–8.0)

## 2023-10-24 LAB — TROPONIN I (HIGH SENSITIVITY)
Troponin I (High Sensitivity): 3 ng/L (ref <18)
Troponin I (High Sensitivity): 3 ng/L (ref <18)

## 2023-10-24 MED ORDER — METHOCARBAMOL 500 MG PO TABS: 500.0000 mg | ORAL_TABLET | Freq: Three times a day (TID) | ORAL | 0 refills | Status: DC | PRN

## 2023-10-24 MED ORDER — FENTANYL CITRATE PF 50 MCG/ML IJ SOSY
50.0000 ug | PREFILLED_SYRINGE | Freq: Once | INTRAMUSCULAR | Status: AC
  Administered 2023-10-24: 50 ug via INTRAVENOUS
  Filled 2023-10-24: qty 1

## 2023-10-24 MED ORDER — ONDANSETRON 4 MG PO TBDP: 4.0000 mg | ORAL_TABLET | Freq: Once | ORAL | Status: AC

## 2023-10-24 MED ORDER — METHOCARBAMOL 500 MG PO TABS
500.0000 mg | ORAL_TABLET | Freq: Once | ORAL | Status: AC
  Administered 2023-10-24: 500 mg via ORAL
  Filled 2023-10-24: qty 1

## 2023-10-24 MED ORDER — SODIUM CHLORIDE 0.9 % IV BOLUS
1000.0000 mL | Freq: Once | INTRAVENOUS | Status: AC
  Administered 2023-10-24: 1000 mL via INTRAVENOUS

## 2023-10-24 MED ORDER — ONDANSETRON 4 MG PO TBDP
ORAL_TABLET | ORAL | Status: AC
  Administered 2023-10-24: 4 mg via ORAL
  Filled 2023-10-24: qty 1

## 2023-10-24 MED ORDER — KETOROLAC TROMETHAMINE 30 MG/ML IJ SOLN
30.0000 mg | Freq: Once | INTRAMUSCULAR | Status: AC
  Administered 2023-10-24: 30 mg via INTRAVENOUS
  Filled 2023-10-24: qty 1

## 2023-10-24 MED ORDER — ONDANSETRON HCL 4 MG/2ML IJ SOLN
4.0000 mg | Freq: Once | INTRAMUSCULAR | Status: AC
  Administered 2023-10-24: 4 mg via INTRAVENOUS
  Filled 2023-10-24: qty 2

## 2023-10-24 NOTE — ED Triage Notes (Addendum)
 Pt to ed from home for urinary urgency x 2 days. Pt states "I havent peed in two days". When discussed she then said "I have peed but not much and I am going all the time". Pt also appears very anxious and restless then complaint of "fluttering in my chest". Pt Is caox4 and in no acute distress in triage. Pt admits to HX of anxiety. Pt also just got back from a weekend of "drinking too much ETOH" for birthday celebrations.

## 2023-10-24 NOTE — Discharge Instructions (Signed)
 Please call tomorrow to schedule follow-up appointment with primary care.  If any of your symptoms change or worsen and you are unable to schedule an appointment, return to the emergency department.

## 2023-10-24 NOTE — ED Provider Notes (Signed)
 [facility] Provider Note    Event Date/Time   First MD Initiated Contact with Patient 10/24/23 1846     (approximate)   History   Urinary Frequency (X 2 days)   HPI  Rebecca Allen is a 58 y.o. female with history of hyperlipidemia, anxiety, GERD, and as listed in EMR presents to the emergency department for treatment and evaluation for urinary urgency and frequency for the past 2 days.  She is only urinating small amounts at a time.  She is also complaining of a fluttering sensation in her chest.  She reports that she recently returned from a weekend of consuming an excessive amount of alcohol and feels dehydrated.Marland Kitchen      Physical Exam   Triage Vital Signs: ED Triage Vitals [10/24/23 1827]  Encounter Vitals Group     BP 113/89     Systolic BP Percentile      Diastolic BP Percentile      Pulse Rate 75     Resp 20     Temp 98.2 F (36.8 C)     Temp Source Oral     SpO2 98 %     Weight 169 lb 12.1 oz (77 kg)     Height 5\' 5"  (1.651 m)     Head Circumference      Peak Flow      Pain Score 5     Pain Loc      Pain Education      Exclude from Growth Chart     Most recent vital signs: Vitals:   10/24/23 1827  BP: 113/89  Pulse: 75  Resp: 20  Temp: 98.2 F (36.8 C)  SpO2: 98%    General: Awake, no distress.  CV:  Good peripheral perfusion.  Resp:  Normal effort.  Abd:  No distention. Soft.  Suprapubic tenderness Other:  Tearful and anxious   ED Results / Procedures / Treatments   Labs (all labs ordered are listed, but only abnormal results are displayed) Labs Reviewed  URINALYSIS, ROUTINE W REFLEX MICROSCOPIC - Abnormal; Notable for the following components:      Result Value   Color, Urine YELLOW (*)    APPearance CLOUDY (*)    Bacteria, UA FEW (*)    All other components within normal limits  BASIC METABOLIC PANEL  CBC  TROPONIN I (HIGH SENSITIVITY)  TROPONIN I (HIGH SENSITIVITY)     EKG  Normal sinus rhythm  with a rate of 72.  No change from previous.   RADIOLOGY  Image and radiology report reviewed and interpreted by me. Radiology report consistent with the same.  Chest x-ray negative for acute cardiopulmonary abnormality.  CT renal stone study is negative for acute concerns.  PROCEDURES:  Critical Care performed: No  Procedures   MEDICATIONS ORDERED IN ED:  Medications  methocarbamol (ROBAXIN) tablet 500 mg (has no administration in time range)  sodium chloride 0.9 % bolus 1,000 mL (1,000 mLs Intravenous New Bag/Given 10/24/23 2028)  ondansetron (ZOFRAN) injection 4 mg (4 mg Intravenous Given 10/24/23 2027)  ketorolac (TORADOL) 30 MG/ML injection 30 mg (30 mg Intravenous Given 10/24/23 2027)  fentaNYL (SUBLIMAZE) injection 50 mcg (50 mcg Intravenous Given 10/24/23 2056)     IMPRESSION / MDM / ASSESSMENT AND PLAN / ED COURSE   I have reviewed the triage note.  Differential diagnosis includes, but is not limited to, dehydration, acute cystitis, pyelonephritis  [name] presentation is most consistent with acute illness / injury with  system symptoms.  58 year old female presenting to the emergency department for treatment and evaluation of urinary frequency and decreased urinary output.  See HPI for further details.  On exam, she has suprapubic tenderness but no other abdominal pain.  She is anxious and crying and tells me that she is concerned that something is wrong with her.  Reassurance given that we would investigate her symptoms.  Labs are all reassuring.  CBC is normal, BMP is normal, troponin is normal, urinalysis is contaminated but does not show evidence of leukocytes or white blood cells.  EKG and chest x-ray are normal as well.  I went to patient's room to discuss results.  She was still tearful and now complaining of left flank pain.  She states it feels similar to previous kidney stone.  CT renal stone study ordered.  CT renal stone study is also negative.  Pain  likely musculoskeletal related.  She will be given a dose of Robaxin prior to discharge and a prescription will be sent to her pharmacy.  She was advised to call and schedule follow-up appointment with her primary care provider.  If her symptoms change or worsen and she is unable to schedule appointment, she is to return to the emergency department.      FINAL CLINICAL IMPRESSION(S) / ED DIAGNOSES   Final diagnoses:  Acute left flank pain  Urinary frequency     Rx / DC Orders   ED Discharge Orders          Ordered    methocarbamol (ROBAXIN) 500 MG tablet  Every 8 hours PRN        10/24/23 2147    methocarbamol (ROBAXIN) 500 MG tablet  Every 8 hours PRN        10/24/23 2151             Note:  This document was prepared using Dragon voice recognition software and may include unintentional dictation errors.   Chinita Pester, FNP 10/24/23 2153    Jene Every, MD 11/01/23 [phone]

## 2023-10-25 ENCOUNTER — Ambulatory Visit: Payer: Self-pay | Admitting: Family Medicine

## 2023-10-25 NOTE — Telephone Encounter (Signed)
 Reason for CRM: Kidneys are hurting.... haven't urinated all day... just diarrhea. Very fatigue and dizzy. Patient is in a lot of pain on her left side     Chief Complaint: Seen in ED 10/24/23 , states received IV fluids. "I'm still passing hardly any urine, fatigue, flank pain. Ive been laying down all day." Symptoms: Above Frequency: Weekend Pertinent Negatives: Patient denies fever Disposition: [x] ED /[] Urgent Care (no appt availability in office) / [] Appointment(In office/virtual)/ []  [place] [facility]/ [] Home Care/ [] Refused Recommended Disposition /[] [place] [facility]/ []  Follow-up with PCP Additional Notes: Will have her husband take her.  Reason for Disposition  Patient sounds very sick or weak to the triager  Answer Assessment - Initial Assessment Questions 1. DESCRIPTION: "Describe how you are feeling."     Fatigue 2. SEVERITY: "How bad is it?"  "Can you stand and walk?"   - MILD (0-3): Feels weak or tired, but does not interfere with work, school or normal activities.   - MODERATE (4-7): Able to stand and walk; weakness interferes with work, school, or normal activities.   - SEVERE (8-10): Unable to stand or walk; unable to do usual activities.     Moderate 3. ONSET: "When did these symptoms begin?" (e.g., hours, days, weeks, months)     Weekend 4. CAUSE: "What do you think is causing the weakness or fatigue?" (e.g., not drinking enough fluids, medical problem, trouble sleeping)     Unsure, seen in ED 5. NEW MEDICINES:  "Have you started on any new medicines recently?" (e.g., opioid pain medicines, benzodiazepines, muscle relaxants, antidepressants, antihistamines, neuroleptics, beta blockers)     nO 6. OTHER SYMPTOMS: "Do you have any other symptoms?" (e.g., chest pain, fever, cough, SOB, vomiting, diarrhea, bleeding, other areas of pain)     Flank pain, left 7. PREGNANCY: "Is there any chance you are pregnant?" "When was your last menstrual period?"      No  Protocols used: Weakness (Generalized) and Fatigue-A-AH

## 2023-10-26 ENCOUNTER — Other Ambulatory Visit: Payer: Self-pay | Admitting: Family Medicine

## 2023-10-26 ENCOUNTER — Ambulatory Visit: Payer: Self-pay | Admitting: Family Medicine

## 2023-10-26 DIAGNOSIS — Z79890 Hormone replacement therapy (postmenopausal): Secondary | ICD-10-CM

## 2023-10-26 NOTE — Telephone Encounter (Signed)
Noted, agree with ED visit.

## 2023-10-26 NOTE — Telephone Encounter (Signed)
 Copied from CRM [id number]. Topic: Clinical - Red Word Triage >> Oct 26, 2023  2:10 PM Jon Gills C wrote: [facility] that prompted transfer to Nurse Triage: Patient called in stating that she still having a lot of pain in her kidneys, have been urinating more but not a lot. Would like to be seen by [facility] Reason for Disposition  Side (flank) or lower back pain present  Answer Assessment - Initial Assessment Questions 1. SYMPTOM: "What's the main symptom you're concerned about?" (e.g., frequency, incontinence)     I'm urinating today.   Yesterday I only peed a little.    I was in hospital Sunday.  They checked for kidney stones.   None seen on CT scan.    I feel like I'm having contractions in my kidneys.   My lower back is really hurting. 2. ONSET: "When did the  pain  start?"     Pain in back since Sunday.   She thought it was a muscle.   They said my blood work and kidneys were fine.   I was dehydrated on Sunday.   I knew I needed IV fluids is why I went to the ED.   I ate some chicken today.  I drank Pedialyte yesterday. I'm having bright yellow diarrhea so bad for 2-3 days.    3. PAIN: "Is there any pain?" If Yes, ask: "How bad is it?" (Scale: 1-10; mild, moderate, severe)     Since Sunday    I'm having nausea.   The ED gave me Zofran which helped but when it wears off I nauseas again. 4. CAUSE: "What do you think is causing the symptoms?"     My kidneys.   I'm very dizzy.  It comes and goes.   I have to catch myself when it hits. 5. OTHER SYMPTOMS: "Do you have any other symptoms?" (e.g., blood in urine, fever, flank pain, pain with urination)     Bilateral flank pain.   No urinary burning, or blood in urine.   Yesterday I peed 1/4 a cup.   Today I've peed 4 times. 6. PREGNANCY: "Is there any chance you are pregnant?" "When was your last menstrual period?"     N/Ad due to age  Protocols used: Urinary Symptoms-A-AH  Chief Complaint: Severe bilateral flank pain, severe bright yellow diarrhea,  very dizzy, low urinary output.   Seen in ED Sunday. Blood work and CT scan for kidney stones was normal.   They gave her IV fluids and discharged her back to home.   "I don't feel like they really did anything for me".   "I'm not any better except I did peed 4 times today but maybe a 4th of a cup yesterday all "I'm having so much pain in my kidneys/lower back area".   Symptoms: Concerned about very low urinary output, very dizzy, having a lot of bright yellow diarrhea.   Not drinking very much and usually drinks large amounts of water daily.   Not feeling well at all.  Poor appetite.  Very nauseas. Frequency: At least since Sunday.  Pertinent Negatives: Patient denies being any better. Disposition: [x] ED /[] Urgent Care (no appt availability in office) / [] Appointment(In office/virtual)/ []  [place] [facility]/ [] Home Care/ [] Refused Recommended Disposition /[] [place] [facility]/ []  Follow-up with PCP Additional Notes: I referred her back to the ED.   She is at work but is going to call her husband to come take her.   I advised her not to  drive since she is so dizzy and in so much pain and is weak.   She asked that I make her an appt with Dr. Ermalene Searing (which I had already made prior to deciding she needed to go on to the ED as she told me more towards the end of the call).    I made a hospital follow up appt with Dr. Ermalene Searing for 10/28/2023.

## 2023-10-26 NOTE — Telephone Encounter (Signed)
 Noted.

## 2023-10-27 MED ORDER — ALPRAZOLAM 0.5 MG PO TABS: 0.5000 mg | ORAL_TABLET | Freq: Every day | ORAL | 0 refills | Status: DC | PRN

## 2023-10-27 NOTE — Telephone Encounter (Signed)
 Last OV: 12/25/22 Pending OV: 10/28/23 Medication: Alprazolam 0.5mg  Directions: Take one tablet daily prn Last Refill: 09/03/23 Qty: #20 with 0 refills

## 2023-10-28 ENCOUNTER — Ambulatory Visit: Admitting: Family Medicine

## 2023-10-28 ENCOUNTER — Ambulatory Visit (INDEPENDENT_AMBULATORY_CARE_PROVIDER_SITE_OTHER)
Admission: RE | Admit: 2023-10-28 | Discharge: 2023-10-28 | Disposition: A | Discharge status: Home / Self Care | Source: Ambulatory Visit | Attending: Family Medicine | Admitting: Family Medicine

## 2023-10-28 ENCOUNTER — Encounter: Payer: Self-pay | Admitting: Family Medicine

## 2023-10-28 VITALS — BP 118/70 | HR 78 | Temp 97.8°F | Ht 65.0 in | Wt 175.0 lb

## 2023-10-28 DIAGNOSIS — M546 Pain in thoracic spine: Secondary | ICD-10-CM | POA: Diagnosis not present

## 2023-10-28 DIAGNOSIS — R519 Headache, unspecified: Secondary | ICD-10-CM | POA: Diagnosis not present

## 2023-10-28 DIAGNOSIS — R42 Dizziness and giddiness: Secondary | ICD-10-CM

## 2023-10-28 LAB — POC URINALSYSI DIPSTICK (AUTOMATED)
Bilirubin, UA: NEGATIVE
Blood, UA: NEGATIVE
Glucose, UA: NEGATIVE
Ketones, UA: NEGATIVE
Leukocytes, UA: NEGATIVE
Nitrite, UA: NEGATIVE
Protein, UA: NEGATIVE
Spec Grav, UA: >=1.03 — AB (ref 1.010–1.025)
Urobilinogen, UA: 0.2 U/dL
pH, UA: 5.5 (ref 5.0–8.0)

## 2023-10-28 MED ORDER — ONDANSETRON HCL 4 MG PO TABS: 4.0000 mg | ORAL_TABLET | Freq: Three times a day (TID) | ORAL | 0 refills | Status: DC | PRN

## 2023-10-28 MED ORDER — HYDROCODONE-ACETAMINOPHEN 5-325 MG PO TABS: 1.0000 | ORAL_TABLET | Freq: Three times a day (TID) | ORAL | 0 refills | Status: DC | PRN

## 2023-10-28 NOTE — Patient Instructions (Signed)
 Tylenol ES 650 mg 2 tablet three times a day,  Apply lidocaine patches.  Hold any NSAIDs given nausea.  I will call with X-ray results.  Push fluids, and return to usual diet.

## 2023-10-28 NOTE — Progress Notes (Signed)
 Patient ID: Rebecca Allen, female    DOB: 01-11-1966, 58 y.o.   MRN: [id number]  This visit was conducted in person.  BP 118/70   Pulse 78   Temp 97.8 F (36.6 C) (Oral)   Ht 5\' 5"  (1.651 m)   Wt 175 lb (79.4 kg)   SpO2 98%   BMI 29.12 kg/m    CC:  Chief Complaint  Patient presents with   Hospitalization Follow-up    Seen on 10/24/23 at [facility] ED, dx acute L flank pain; urinary frequency. Now c/o low back pain, ongoing urinary frequency and HA. H/o kidney stones.     Subjective:   HPI: Rebecca Allen is a 58 y.o. female presenting on 10/28/2023 for Hospitalization Follow-up (Seen on 10/24/23 at [facility] ED, dx acute L flank pain; urinary frequency. Now c/o low back pain, ongoing urinary frequency and HA. H/o kidney stones. )  Reviewed recent [facility] ED note from 10/24/2023 for urinary  hesitancy, Additionally seen in ED September 05, 2023 with flank pain  In January CMP, CBC, lipase and urinalysis within normal limits, CT renal stone study was negative.  Felt likely secondary to musculoskeletal strain.  Treated with ice, heat, lidocaine patches, muscle relaxant and Tylenol, prescription for oxycodone given  At most recent ED visit October 24, 2023 CBC, BMP, troponin, urinalysis unremarkable EKG and chest x-ray within normal limits. CT renal stone study negative ( no stones seen) ED MD noted that patient was very anxious and crying concerned that something terrible was wrong with her. Given IVF. Treated with Robaxin.  Today she reports  urinating normally now. No frequency or hesitancy.  Feels some abdominal soreness when urinating.  No vaginal discharge or itching.  Bilateral mid back pain.Marland Kitchen throbbing , intermittent pain in mid back.  She is feeling dizzy, nausea. No emesis.  Has had intermittent headache in last 3 weeks.  Feeling bloated off and on.  No current diarrhea.  Eating blander foods, broth.   Using pantoprazole 40 mg daily.  Tylenol and ibuprofen 600  mg Heat and lidocaine.   No  falls or change in activity.  Some stress, nothing different than usual.  PDMP reviewed during this encounter.  Patient unable to sit for long in office and has to stand or lean over. Pain worse with lying flat on exam table No new numbness or weakness in legs      She has been drinking lots of water 32 oz , Pedialyte Relevant past medical, surgical, family and social history reviewed and updated as indicated. Interim medical history since our last visit reviewed. Allergies and medications reviewed and updated. Outpatient Medications Prior to Visit  Medication Sig Dispense Refill   ALPRAZolam (XANAX) 0.5 MG tablet Take 1 tablet (0.5 mg total) by mouth daily as needed for anxiety. 20 tablet 0   aspirin 81 MG chewable tablet Chew 81 mg by mouth daily.     calcium carbonate (TUMS - DOSED IN MG ELEMENTAL CALCIUM) 500 MG chewable tablet Chew 6 tablets by mouth daily as needed for indigestion or heartburn.     clobetasol ointment (TEMOVATE) 0.05 % Apply topically 2 (two) times daily as needed.     estradiol (VIVELLE-DOT) 0.05 MG/24HR patch PLACE 1 PATCH TRANS-DERMALLY TWICE A WEEK. REMOVE OLD PATCH BEFORE APPLYING NEW PATCH. 24 patch 0   pantoprazole (PROTONIX) 40 MG tablet Take 1 tablet (40 mg total) by mouth daily. 90 tablet 1   betamethasone dipropionate 0.05 % cream Apply topically 2 (  two) times daily. 30 g 0   meloxicam (MOBIC) 15 MG tablet Take 1 tablet (15 mg total) by mouth daily. 30 tablet 0   methocarbamol (ROBAXIN) 500 MG tablet Take 1 tablet (500 mg total) by mouth every 8 (eight) hours as needed. 30 tablet 0   methocarbamol (ROBAXIN) 500 MG tablet Take 1 tablet (500 mg total) by mouth every 8 (eight) hours as needed. 30 tablet 0   ondansetron (ZOFRAN) 4 MG tablet Take 1 tablet (4 mg total) by mouth every 8 (eight) hours as needed for nausea or vomiting. 20 tablet 0   oxyCODONE-acetaminophen (PERCOCET) 5-325 MG tablet Take 1 tablet by mouth every 4  (four) hours as needed for severe pain (pain score 7-10). 20 tablet 0   tamsulosin (FLOMAX) 0.4 MG CAPS capsule Take 1 capsule (0.4 mg total) by mouth daily. 15 capsule 0   No facility-administered medications prior to visit.     Per HPI unless specifically indicated in ROS section below Review of Systems  Constitutional:  Negative for fatigue and fever.  HENT:  Negative for congestion.   Eyes:  Negative for pain.  Respiratory:  Negative for cough and shortness of breath.   Cardiovascular:  Negative for chest pain, palpitations and leg swelling.  Gastrointestinal:  Negative for abdominal pain.  Genitourinary:  Negative for dysuria and vaginal bleeding.  Musculoskeletal:  Positive for back pain.  Neurological:  Negative for syncope, light-headedness and headaches.  Psychiatric/Behavioral:  Negative for dysphoric mood.    Objective:  BP 118/70   Pulse 78   Temp 97.8 F (36.6 C) (Oral)   Ht 5\' 5"  (1.651 m)   Wt 175 lb (79.4 kg)   SpO2 98%   BMI 29.12 kg/m   Wt Readings from Last 3 Encounters:  10/28/23 175 lb (79.4 kg)  10/24/23 169 lb 12.1 oz (77 kg)  09/05/23 171 lb (77.6 kg)      Physical Exam Constitutional:      General: She is not in acute distress.    Appearance: Normal appearance. She is well-developed. She is not ill-appearing or toxic-appearing.  HENT:     Head: Normocephalic.     Right Ear: Hearing, tympanic membrane, ear canal and external ear normal. Tympanic membrane is not erythematous, retracted or bulging.     Left Ear: Hearing, tympanic membrane, ear canal and external ear normal. Tympanic membrane is not erythematous, retracted or bulging.     Nose: No mucosal edema or rhinorrhea.     Right Sinus: No maxillary sinus tenderness or frontal sinus tenderness.     Left Sinus: No maxillary sinus tenderness or frontal sinus tenderness.     Mouth/Throat:     Pharynx: Uvula midline.  Eyes:     General: Lids are normal. Lids are everted, no foreign bodies  appreciated.     Conjunctiva/sclera: Conjunctivae normal.     Pupils: Pupils are equal, round, and reactive to light.  Neck:     Thyroid: No thyroid mass or thyromegaly.     Vascular: No carotid bruit.     Trachea: Trachea normal.  Cardiovascular:     Rate and Rhythm: Normal rate and regular rhythm.     Pulses: Normal pulses.     Heart sounds: Normal heart sounds, S1 normal and S2 normal. No murmur heard.    No friction rub. No gallop.  Pulmonary:     Effort: Pulmonary effort is normal. No tachypnea or respiratory distress.     Breath sounds: Normal breath sounds.  No decreased breath sounds, wheezing, rhonchi or rales.  Abdominal:     General: Bowel sounds are normal.     Palpations: Abdomen is soft.     Tenderness: There is no abdominal tenderness. There is right CVA tenderness and left CVA tenderness. There is no guarding or rebound.     Hernia: No hernia is present.  Musculoskeletal:     Cervical back: Normal, normal range of motion and neck supple.     Thoracic back: Spasms and tenderness present. No bony tenderness. Decreased range of motion.     Lumbar back: Normal. No tenderness or bony tenderness. Normal range of motion. Negative right straight leg raise test and negative left straight leg raise test.       Back:  Skin:    General: Skin is warm and dry.     Findings: No rash.  Neurological:     Mental Status: She is alert and oriented to person, place, and time.     GCS: GCS eye subscore is 4. GCS verbal subscore is 5. GCS motor subscore is 6.     Cranial Nerves: No cranial nerve deficit.     Sensory: No sensory deficit.     Motor: No abnormal muscle tone.     Coordination: Coordination normal.     Gait: Gait normal.     Deep Tendon Reflexes: Reflexes are normal and symmetric.     Comments: Nml cerebellar exam   No papilledema  Psychiatric:        Mood and Affect: Mood is not anxious or depressed.        Speech: Speech normal.        Behavior: Behavior normal.  Behavior is cooperative.        Thought Content: Thought content normal.        Cognition and Memory: Memory is not impaired. She does not exhibit impaired recent memory or impaired remote memory.        Judgment: Judgment normal.       Results for orders placed or performed in visit on 10/28/23  POCT Urinalysis Dipstick (Automated)   Collection Time: 10/28/23  9:33 AM  Result Value Ref Range   Color, UA dark yellow    Clarity, UA clear    Glucose, UA Negative Negative   Bilirubin, UA negative    Ketones, UA negative    Spec Grav, UA >=1.030 (A) 1.010 - 1.025   Blood, UA negative    pH, UA 5.5 5.0 - 8.0   Protein, UA Negative Negative   Urobilinogen, UA 0.2 0.2 or 1.0 E.U./dL   Nitrite, UA negative    Leukocytes, UA Negative Negative    Assessment and Plan  Acute bilateral thoracic back pain -     POCT Urinalysis Dipstick (Automated) -     DG Thoracic Spine W/Swimmers; Future  Dizziness  Acute nonintractable headache, unspecified headache type  Other orders -     HYDROcodone-Acetaminophen; Take 1 tablet by mouth every 8 (eight) hours as needed for moderate pain (pain score 4-6) or severe pain (pain score 7-10).  Dispense: 15 tablet; Refill: 0 -     Ondansetron HCl; Take 1 tablet (4 mg total) by mouth every 8 (eight) hours as needed for nausea or vomiting.  Dispense: 20 tablet; Refill: 0  Unclear etiology of current symptoms.  Exam most consistent with musculoskeletal strain in the thoracic spine.  The only way to explain her additional symptoms is potentially medication side effect to anti-inflammatories, muscle relaxant  and mild dehydration. Urinalysis in office again is clear with no sign of infection. No abdominal pain on exam and to recent renal CT studies within normal limits. Given severity of pain, movement associated will evaluate thoracic spine with plain x-ray.  I will have her hold nonsteroidal anti-inflammatories, tramadol causes nausea, prednisone gives her  side effects.  Will use hydrocodone for breakthrough pain, encouraged her to use half to 1 tablet daily as needed more severe pain but otherwise control pain with Tylenol extra strength 2 capsules 3 times daily.  Max Tylenol dose 4000 mg daily She will also apply lidocaine patch.  Return and ER precautions provided.  No follow-ups on file.   Kerby Nora, MD

## 2023-11-01 ENCOUNTER — Encounter: Payer: Self-pay | Admitting: Family Medicine

## 2023-11-05 MED ORDER — ESCITALOPRAM OXALATE 10 MG PO TABS: 10.0000 mg | ORAL_TABLET | Freq: Every day | ORAL | 2 refills | Status: DC

## 2023-11-29 ENCOUNTER — Ambulatory Visit: Admitting: Family

## 2023-11-29 ENCOUNTER — Encounter: Payer: Self-pay | Admitting: Family

## 2023-11-29 ENCOUNTER — Telehealth: Payer: Self-pay | Admitting: Family

## 2023-11-29 VITALS — BP 122/84 | HR 81 | Temp 98.1°F | Ht 65.0 in | Wt 179.2 lb

## 2023-11-29 DIAGNOSIS — M7918 Myalgia, other site: Secondary | ICD-10-CM | POA: Insufficient documentation

## 2023-11-29 DIAGNOSIS — M546 Pain in thoracic spine: Secondary | ICD-10-CM | POA: Diagnosis not present

## 2023-11-29 MED ORDER — KETOROLAC TROMETHAMINE 60 MG/2ML IM SOLN
60.0000 mg | Freq: Once | INTRAMUSCULAR | Status: AC
  Administered 2023-11-29: 60 mg via INTRAMUSCULAR

## 2023-11-29 MED ORDER — GABAPENTIN 100 MG PO CAPS: 100.0000 mg | ORAL_CAPSULE | Freq: Three times a day (TID) | ORAL | 0 refills | Status: DC

## 2023-11-29 MED ORDER — LIDOCAINE 5 % EX OINT: 1.0000 | TOPICAL_OINTMENT | CUTANEOUS | 0 refills | Status: DC | PRN

## 2023-11-29 NOTE — Telephone Encounter (Signed)
 After review of pts chart she last saw Dr. Cherlyn Cornet for this same concern one month ago. At this point I do believe she'd be better suited to see Dr. Cherlyn Cornet for this as next steps are TBD. Chronic vs acute

## 2023-11-29 NOTE — Telephone Encounter (Signed)
 This is ok thanks Amy for calling to clarify.  Saw pt, see note if needed

## 2023-11-29 NOTE — Patient Instructions (Signed)
  Abapentin  Start 100 mg nightly  Can increase to 200 mg nightly day two  And then can increase to 300 mg nightly day three  If 300 mg at night time helping can then increase to 100 mg in am, 300 mg at night  And then ramp up to 100 mg qam and mid day and then 300 mg at night time.

## 2023-11-29 NOTE — Progress Notes (Unsigned)
 Established Patient Office Visit  Subjective:   Patient ID: Rebecca Allen, female    DOB: 1966-06-17  Age: 58 y.o. MRN: [id number]  CC:  Chief Complaint  Patient presents with  . Back Pain    HPI: Rebecca Allen is a 58 y.o. female presenting on 11/29/2023 for Back Pain  Still ongoing back pain, with movement it is very painful, any type of movement and is not allowing her to sleep at night. No known injury or trauma, has seen orthopedist in the past for damaged nerves' and was given injections, but she feels this is not related to the same area, it was more in the lower back. This is more so on the left midline to lateral side of the back. With movement she will notice sharp stabbing pains. Xray thoracic spine 10/28/23 negative.   She had went to ER 3/16 and had CT renal stone which was negative.  No urinary symptoms.   Heat helps slightly tylenol  extra strength doesn't help a whole much. Hydrocodone  helped her sleep a bit but otherwise didn't help for the pain.   Has sensitivity to ibuprofen  if she takes a lot it upsets her stomach. Prednisone  makes her heart race.   She did fall yesterday onto her right knee and has some swelling, and an abrasion. So far doing well with otc antibx ointment.        ROS: Negative unless specifically indicated above in HPI.   Relevant past medical history reviewed and updated as indicated.   Allergies and medications reviewed and updated.   Current Outpatient Medications:  .  ALPRAZolam  (XANAX ) 0.5 MG tablet, Take 1 tablet (0.5 mg total) by mouth daily as needed for anxiety., Disp: 20 tablet, Rfl: 0 .  calcium carbonate (TUMS - DOSED IN MG ELEMENTAL CALCIUM) 500 MG chewable tablet, Chew 6 tablets by mouth daily as needed for indigestion or heartburn., Disp: , Rfl:  .  clobetasol ointment (TEMOVATE) 0.05 %, Apply topically 2 (two) times daily as needed., Disp: , Rfl:  .  escitalopram  (LEXAPRO ) 10 MG tablet, Take 1 tablet (10 mg  total) by mouth daily., Disp: 30 tablet, Rfl: 2 .  estradiol  (VIVELLE -DOT) 0.05 MG/24HR patch, PLACE 1 PATCH TRANS-DERMALLY TWICE A WEEK. REMOVE OLD PATCH BEFORE APPLYING NEW PATCH., Disp: 24 patch, Rfl: 0 .  gabapentin  (NEURONTIN ) 100 MG capsule, Take 1 capsule (100 mg total) by mouth 3 (three) times daily., Disp: 90 capsule, Rfl: 0 .  HYDROcodone -acetaminophen  (NORCO/VICODIN) 5-325 MG tablet, Take 1 tablet by mouth every 8 (eight) hours as needed for moderate pain (pain score 4-6) or severe pain (pain score 7-10)., Disp: 15 tablet, Rfl: 0 .  lidocaine  (XYLOCAINE ) 5 % ointment, Apply 1 Application topically as needed., Disp: 35.44 g, Rfl: 0 .  ondansetron  (ZOFRAN ) 4 MG tablet, Take 1 tablet (4 mg total) by mouth every 8 (eight) hours as needed for nausea or vomiting., Disp: 20 tablet, Rfl: 0 .  pantoprazole  (PROTONIX ) 40 MG tablet, Take 1 tablet (40 mg total) by mouth daily., Disp: 90 tablet, Rfl: 1  Allergies  Allergen Reactions  . Metoclopramide Hcl     REACTION: Nervousness  . Morphine  And Codeine  Nausea Only  . Venlafaxine  Other (See Comments)    Hot flashes, clammy and nausea    Objective:   BP 122/84 (BP Location: Left Arm, Patient Position: Sitting, Cuff Size: Normal)   Pulse 81   Temp 98.1 F (36.7 C) (Temporal)   Ht 5\' 5"  (1.651 m)  Wt 179 lb 3.2 oz (81.3 kg)   SpO2 99%   BMI 29.82 kg/m    Physical Exam Constitutional:      General: She is not in acute distress.    Appearance: Normal appearance. She is normal weight. She is not ill-appearing, toxic-appearing or diaphoretic.  HENT:     Head: Normocephalic.  Cardiovascular:     Rate and Rhythm: Normal rate.  Pulmonary:     Effort: Pulmonary effort is normal.  Musculoskeletal:     Thoracic back: Spasms and tenderness present. Decreased range of motion.       Back:  Neurological:     General: No focal deficit present.     Mental Status: She is alert and oriented to person, place, and time. Mental status is at  baseline.  Psychiatric:        Mood and Affect: Mood normal.        Behavior: Behavior normal.        Thought Content: Thought content normal.        Judgment: Judgment normal.    Assessment & Plan:  Myofascial pain syndrome of thoracic spine -     Ambulatory referral to Neurosurgery -     Gabapentin ; Take 1 capsule (100 mg total) by mouth 3 (three) times daily.  Dispense: 90 capsule; Refill: 0 -     Lidocaine ; Apply 1 Application topically as needed.  Dispense: 35.44 g; Refill: 0 -     Ketorolac  Tromethamine   Acute left-sided thoracic back pain -     Ambulatory referral to Neurosurgery -     Gabapentin ; Take 1 capsule (100 mg total) by mouth 3 (three) times daily.  Dispense: 90 capsule; Refill: 0 -     Lidocaine ; Apply 1 Application topically as needed.  Dispense: 35.44 g; Refill: 0 -     Ketorolac  Tromethamine      Follow up plan: No follow-ups on file.  Rebecca Horns, FNP

## 2023-11-30 NOTE — Assessment & Plan Note (Signed)
 Suspected.  Referral placed for neurosurgery may benefit from trigger point injections if this is confirmed.  Trial gabapentin  100 mg tid discussed titration  Discussed s/e drowsiness precautions  Apply lidocaine  prn  Toradol  in office today Exercises, heat.

## 2023-12-01 ENCOUNTER — Ambulatory Visit: Payer: Self-pay

## 2023-12-01 ENCOUNTER — Emergency Department

## 2023-12-01 ENCOUNTER — Emergency Department
Admission: EM | Admit: 2023-12-01 | Discharge: 2023-12-01 | Disposition: A | Discharge status: Home / Self Care | Attending: Emergency Medicine | Admitting: Emergency Medicine

## 2023-12-01 ENCOUNTER — Telehealth: Payer: Self-pay | Admitting: Family Medicine

## 2023-12-01 DIAGNOSIS — R110 Nausea: Secondary | ICD-10-CM | POA: Insufficient documentation

## 2023-12-01 DIAGNOSIS — T426X5A Adverse effect of other antiepileptic and sedative-hypnotic drugs, initial encounter: Secondary | ICD-10-CM | POA: Insufficient documentation

## 2023-12-01 DIAGNOSIS — R079 Chest pain, unspecified: Secondary | ICD-10-CM | POA: Diagnosis present

## 2023-12-01 DIAGNOSIS — R42 Dizziness and giddiness: Secondary | ICD-10-CM | POA: Insufficient documentation

## 2023-12-01 DIAGNOSIS — R0602 Shortness of breath: Secondary | ICD-10-CM | POA: Insufficient documentation

## 2023-12-01 DIAGNOSIS — T50905A Adverse effect of unspecified drugs, medicaments and biological substances, initial encounter: Secondary | ICD-10-CM

## 2023-12-01 LAB — COMPREHENSIVE METABOLIC PANEL WITH GFR
ALT: 41 U/L (ref 0–44)
AST: 36 U/L (ref 15–41)
Albumin: 4.4 g/dL (ref 3.5–5.0)
Alkaline Phosphatase: 44 U/L (ref 38–126)
Anion gap: 9 (ref 5–15)
BUN: 18 mg/dL (ref 6–20)
CO2: 25 mmol/L (ref 22–32)
Calcium: 9.5 mg/dL (ref 8.9–10.3)
Chloride: 105 mmol/L (ref 98–111)
Creatinine, Ser: 0.79 mg/dL (ref 0.44–1.00)
GFR, Estimated: >60 mL/min (ref >60)
Glucose, Bld: 103 mg/dL — ABNORMAL HIGH (ref 70–99)
Potassium: 3.9 mmol/L (ref 3.5–5.1)
Sodium: 139 mmol/L (ref 135–145)
Total Bilirubin: 0.7 mg/dL (ref 0.0–1.2)
Total Protein: 7.3 g/dL (ref 6.5–8.1)

## 2023-12-01 LAB — CBC WITH DIFFERENTIAL/PLATELET
Abs Immature Granulocytes: 0.01 10*3/uL (ref 0.00–0.07)
Basophils Absolute: 0 10*3/uL (ref 0.0–0.1)
Basophils Relative: 1 %
Eosinophils Absolute: 0.1 10*3/uL (ref 0.0–0.5)
Eosinophils Relative: 2 %
HCT: 39.2 % (ref 36.0–46.0)
Hemoglobin: 13.3 g/dL (ref 12.0–15.0)
Immature Granulocytes: 0 %
Lymphocytes Relative: 31 %
Lymphs Abs: 1.8 10*3/uL (ref 0.7–4.0)
MCH: 31 pg (ref 26.0–34.0)
MCHC: 33.9 g/dL (ref 30.0–36.0)
MCV: 91.4 fL (ref 80.0–100.0)
Monocytes Absolute: 0.4 10*3/uL (ref 0.1–1.0)
Monocytes Relative: 7 %
Neutro Abs: 3.5 10*3/uL (ref 1.7–7.7)
Neutrophils Relative %: 59 %
Platelets: 250 10*3/uL (ref 150–400)
RBC: 4.29 MIL/uL (ref 3.87–5.11)
RDW: 12.8 % (ref 11.5–15.5)
WBC: 5.9 10*3/uL (ref 4.0–10.5)
nRBC: 0 % (ref 0.0–0.2)

## 2023-12-01 LAB — TROPONIN I (HIGH SENSITIVITY)
Troponin I (High Sensitivity): <2 ng/L (ref <18)
Troponin I (High Sensitivity): <2 ng/L (ref <18)

## 2023-12-01 MED ORDER — HYDROCODONE-ACETAMINOPHEN 5-325 MG PO TABS: 1.0000 | ORAL_TABLET | Freq: Three times a day (TID) | ORAL | 0 refills | Status: DC | PRN

## 2023-12-01 MED ORDER — IOHEXOL 350 MG/ML SOLN
75.0000 mL | Freq: Once | INTRAVENOUS | Status: AC | PRN
  Administered 2023-12-01: 75 mL via INTRAVENOUS

## 2023-12-01 MED ORDER — ONDANSETRON HCL 4 MG/2ML IJ SOLN
4.0000 mg | Freq: Once | INTRAMUSCULAR | Status: AC
  Administered 2023-12-01: 4 mg via INTRAVENOUS
  Filled 2023-12-01: qty 2

## 2023-12-01 MED ORDER — DICLOFENAC SODIUM 75 MG PO TBEC: 75.0000 mg | DELAYED_RELEASE_TABLET | Freq: Two times a day (BID) | ORAL | 0 refills | Status: DC

## 2023-12-01 MED ORDER — HYDROMORPHONE HCL 1 MG/ML IJ SOLN
1.0000 mg | Freq: Once | INTRAMUSCULAR | Status: AC
  Administered 2023-12-01: 1 mg via INTRAVENOUS
  Filled 2023-12-01: qty 1

## 2023-12-01 MED ORDER — LORAZEPAM 1 MG PO TABS
1.0000 mg | ORAL_TABLET | Freq: Once | ORAL | Status: AC
  Administered 2023-12-01: 1 mg via ORAL
  Filled 2023-12-01: qty 1

## 2023-12-01 MED ORDER — SODIUM CHLORIDE 0.9 % IV BOLUS
500.0000 mL | Freq: Once | INTRAVENOUS | Status: AC
  Administered 2023-12-01: 500 mL via INTRAVENOUS

## 2023-12-01 NOTE — Telephone Encounter (Signed)
 Quillian Brunt nurse called to let Dr. Cherlyn Cornet know she was sending pt to ER. Lane Pinon states pt was experiencing chest pains, pain/nausea. See triage note for more info.

## 2023-12-01 NOTE — Telephone Encounter (Signed)
Patient sent to ED.

## 2023-12-01 NOTE — ED Notes (Signed)
 Pt is tearful while speaking with PA, she reports chest pain and feels that she is allergic reaction to gabapentin .

## 2023-12-01 NOTE — ED Triage Notes (Signed)
 Pt presents to the ED POV from home for possible allergic reaction to gabapentin . Pt reports taking it the past two nights. States that yesterday she started having nausea, dizziness, abdominal cramping and chest pain.

## 2023-12-01 NOTE — ED Provider Notes (Signed)
 [facility] Provider Note    Event Date/Time   First MD Initiated Contact with Patient 12/01/23 1339     (approximate)   History   Allergic Reaction   HPI  Rebecca Allen is a 58 y.o. female hyperlipidemia, kidney stones, chronic back pain presents emergency department complaining of allergic reaction and chest pain.  Patient states that she thinks she is having a reaction to the gabapentin .  States this made her dizzy and nauseated.  About 3 hours ago started having left-sided chest pain that radiates into her back.  Some shortness of breath associated with it.  No rash or hives.  No vomiting.      Physical Exam   Triage Vital Signs: ED Triage Vitals  Encounter Vitals Group     BP 12/01/23 1216 95/65     Systolic BP Percentile --      Diastolic BP Percentile --      Pulse Rate 12/01/23 1212 81     Resp 12/01/23 1212 18     Temp 12/01/23 1212 98.9 F (37.2 C)     Temp Source 12/01/23 1212 Oral     SpO2 12/01/23 1212 97 %     Weight 12/01/23 1212 178 lb 9.2 oz (81 kg)     Height 12/01/23 1212 5\' 5"  (1.651 m)     Head Circumference --      Peak Flow --      Pain Score 12/01/23 1212 8     Pain Loc --      Pain Education --      Exclude from Growth Chart --     Most recent vital signs: Vitals:   12/01/23 1212 12/01/23 1216  BP:  95/65  Pulse: 81   Resp: 18   Temp: 98.9 F (37.2 C)   SpO2: 97%      General: Awake, no distress.   CV:  Good peripheral perfusion. regular rate and  rhythm Resp:  Normal effort. Lungs CTA Abd:  No distention.   Other:  Skin without hives/redness.  Patient is very anxious   ED Results / Procedures / Treatments   Labs (all labs ordered are listed, but only abnormal results are displayed) Labs Reviewed  COMPREHENSIVE METABOLIC PANEL WITH GFR - Abnormal; Notable for the following components:      Result Value   Glucose, Bld 103 (*)    All other components within normal limits  CBC WITH  DIFFERENTIAL/PLATELET  TROPONIN I (HIGH SENSITIVITY)  TROPONIN I (HIGH SENSITIVITY)     EKG  EKG   RADIOLOGY Chest x-ray    PROCEDURES:   Procedures Chief Complaint  Patient presents with   Allergic Reaction      MEDICATIONS ORDERED IN ED: Medications  HYDROmorphone  (DILAUDID ) injection 1 mg (has no administration in time range)  ondansetron  (ZOFRAN ) injection 4 mg (has no administration in time range)  LORazepam  (ATIVAN ) tablet 1 mg (1 mg Oral Given 12/01/23 1410)  sodium chloride  0.9 % bolus 500 mL (500 mLs Intravenous New Bag/Given 12/01/23 1415)     IMPRESSION / MDM / ASSESSMENT AND PLAN / ED COURSE  I reviewed the triage vital signs and the nursing notes.                              Differential diagnosis includes, but is not limited to, MI, angina, allergic reaction, medication side effect, chronic back pain, anxiety  [name] presentation  is most consistent with acute presentation with potential threat to life or bodily function.   I do have concerns with patient having left-sided chest pain that radiates to the back and shortness of breath that we need to ensure that her troponins 1 and 2 are negative.  If those are negative can run medication for allergic reaction.  At this time we will give her Ativan  1 mg p.o.  Patient seems to be very anxious and jittery hopefully this will help.  EKG shows normal sinus rhythm, no significant change since October 24, 2023  Labs are reassuring, first troponin is negative, CBC metabolic panel normal  Will give patient 500 mL of normal saline.  Dilaudid  1 mg IV, Zofran  4 mg IV  Second troponin reassuring  Due to the patient's continued chest pain and shortness of breath we will go ahead and do CTA for PE.  No charge T-spine.  Plan at this time is to await CT findings.  If all is negative she should follow-up with her doctor.  Care is being transferred to [facility], PA-C      FINAL CLINICAL IMPRESSION(S) /  ED DIAGNOSES   Final diagnoses:  Nonspecific chest pain  Adverse effect of drug, initial encounter     Rx / DC Orders   ED Discharge Orders     None        Note:  This document was prepared using Dragon voice recognition software and may include unintentional dictation errors.    Delsie Figures, PA-C 12/01/23 1522    Bryson Carbine, MD 12/03/23 [phone]

## 2023-12-01 NOTE — ED Notes (Signed)
 Pt was taken to radiology

## 2023-12-01 NOTE — Discharge Instructions (Addendum)
 Your exam and labs are normal. Your CT scan does not show any signs of a DVT. You should follow-up with your PCP or specialist as discussed or as planned.

## 2023-12-01 NOTE — ED Provider Notes (Signed)
 ----------------------------------------- 3:23 PM on 12/01/2023 -----------------------------------------  Blood pressure 95/65, pulse 81, temperature 98.9 F (37.2 C), temperature source Oral, resp. rate 18, height 5\' 5"  (1.651 m), weight 81 kg, SpO2 97%.  Assuming care from Aneta Keepers, PA-C/NP-C.  In short, Rebecca Allen is a 58 y.o. female with a chief complaint of Allergic Reaction .  Refer to the original H&P for additional details.  The current plan of care is to await pending CTA results, and disposition the patient accordingly.  ____________________________________________    ED Results / Procedures / Treatments   Labs (all labs ordered are listed, but only abnormal results are displayed) Labs Reviewed  COMPREHENSIVE METABOLIC PANEL WITH GFR - Abnormal; Notable for the following components:      Result Value   Glucose, Bld 103 (*)    All other components within normal limits  CBC WITH DIFFERENTIAL/PLATELET  TROPONIN I (HIGH SENSITIVITY)  TROPONIN I (HIGH SENSITIVITY)    EKG   RADIOLOGY  I personally viewed and evaluated these images as part of my medical decision making, as well as reviewing the written report by the radiologist.  ED Provider Interpretation: No acute intrathoracic process}  DG Chest 2 View Result Date: 12/01/2023 CLINICAL DATA:  Chest pain. EXAM: CHEST - 2 VIEW COMPARISON:  Dec 24, 2023. FINDINGS: The heart size and mediastinal contours are within normal limits. Both lungs are clear. The visualized skeletal structures are unremarkable. IMPRESSION: No active cardiopulmonary disease. Electronically Signed   By: Rosalene Colon M.D.   On: 12/01/2023 14:54    CTA Chest PE  IMPRESSION: *No evidence of pulmonary embolism. *Subtle bilateral ground-glass appearing infiltrates mosaic appearance of both lungs could correlate with pneumonitis. Some nodular atelectasis of the right middle lobe. Differential diagnosis may be that of chronic  interstitial lung disease and/or early subtle congestive changes.   PROCEDURES:  Critical Care performed: No  Procedures   MEDICATIONS ORDERED IN ED: Medications  HYDROmorphone  (DILAUDID ) injection 1 mg (has no administration in time range)  ondansetron  (ZOFRAN ) injection 4 mg (has no administration in time range)  LORazepam  (ATIVAN ) tablet 1 mg (1 mg Oral Given 12/01/23 1410)  sodium chloride  0.9 % bolus 500 mL (500 mLs Intravenous New Bag/Given 12/01/23 1415)     IMPRESSION / MDM / ASSESSMENT AND PLAN / ED COURSE  I reviewed the triage vital signs and the nursing notes.                              Differential diagnosis includes, but is not limited to, ACS, aortic dissection, pulmonary embolism, cardiac tamponade, pneumothorax, pneumonia, pericarditis, myocarditis, GI-related causes including esophagitis/gastritis, and musculoskeletal chest wall pain.    [name] presentation is most consistent with acute presentation with potential threat to life or bodily function.  [name] diagnosis is consistent with nonspecific chest pain and flank pain on presentation.  Patient with a reassuring exam and workup at this time.  No CT evidence of any acute PE based on my interpretation.  Patient will be discharged home with prescriptions for diclofenac  and hydrocodone  (#9). Patient is to follow up with her PCP as scheduled, as needed or otherwise directed. Patient is given ED precautions to return to the ED for any worsening or new symptoms.     FINAL CLINICAL IMPRESSION(S) / ED DIAGNOSES   Final diagnoses:  Nonspecific chest pain  Adverse effect of drug, initial encounter     Rx / DC Orders  ED Discharge Orders     None        Note:  This document was prepared using Dragon voice recognition software and may include unintentional dictation errors.    May Sparks, PA-C 12/03/23 1944    Lind Repine, MD 12/06/23 [phone]

## 2023-12-01 NOTE — Telephone Encounter (Signed)
  Chief Complaint: chest pain Symptoms: chest pain radiating to back, N/V, SOB at times  Frequency: not felt good since Monday when started gabapentin  but chest pain started today  Pertinent Negatives: NA Disposition: [x] ED /[] Urgent Care (no appt availability in office) / [] Appointment(In office/virtual)/ []  [facility]/ [] Home Care/ [] Refused Recommended Disposition /[] [place]/ []  Follow-up with PCP Additional Notes: pt feels like she having SE from gabapentin  she was started on but hasn't felt good since Monday. Advised pt based on sx to go to ED for eval to r/o MI. Pt verbalized understanding. CAL notified.   Copied from CRM [id number]. Topic: Clinical - Red Word Triage >> Dec 01, 2023 11:33 AM Luane Rumps D wrote: Red Word that prompted transfer to Nurse Triage: Chest pain/nausea started a few hours ago, prescribed gabapentin  (NEURONTIN ) 100 MG capsule on Monday.  Reason for Disposition  SEVERE chest pain  Answer Assessment - Initial Assessment Questions 1. LOCATION: "Where does it hurt?"       Middle of chest  2. RADIATION: "Does the pain go anywhere else?" (e.g., into neck, jaw, arms, back)     Radiating to back  3. ONSET: "When did the chest pain begin?" (Minutes, hours or days)      Today  4. PATTERN: "Does the pain come and go, or has it been constant since it started?"  "Does it get worse with exertion?"      Constant  6. SEVERITY: "How bad is the pain?"  (e.g., Scale 1-10; mild, moderate, or severe)    - MILD (1-3): doesn't interfere with normal activities     - MODERATE (4-7): interferes with normal activities or awakens from sleep    - SEVERE (8-10): excruciating pain, unable to do any normal activities       8/10 9. CAUSE: "What do you think is causing the chest pain?"     Feeling like reaction to gabapentin   10. OTHER SYMPTOMS: "Do you have any other symptoms?" (e.g., dizziness, nausea, vomiting, sweating, fever, difficulty breathing, cough)        N/V, stomach pain and SOB at times  Protocols used: Chest Pain-A-AH

## 2023-12-02 NOTE — Telephone Encounter (Signed)
 Noted.

## 2023-12-13 NOTE — Progress Notes (Unsigned)
 Referring Physician:  Felicita Horns, FNP [address] [place],  [place] [address]  Primary Physician:  Judithann Novas, MD  History of Present Illness: 12/14/2023 [name] is a 58 y.o with a history of HLD, anxiety, GERD who is here today with a chief complaint of left sided scapular/ upper back pain.  She states that this started in March without inciting event and persistent despite ibuprofen , Gabapentin  (which she had a bad reaction to), tylenol , prednisone , Flexeril , and Robaxin  and Hydrocodone .  Despite multiple medication she has not had any significant improvement in her symptoms.  They are significantly impacting her quality of life.  In addition to the symptoms mentioned above she does have some left-sided neck pain and occipital cervical headaches.  She states that her symptoms are constant but do wax and wane in severity.  They seem to be exacerbated with sitting or turning over in bed which seems to cause a grabbing catching pain in her shoulder blade and upper back and improved some with standing and heat.  Conservative measures:  Physical therapy:  has not participated in PT Multimodal medical therapy including regular antiinflammatories:  tylenol , hydrocodone , ibuprofen , prednisone , diclofenac , gabapentin  Injections: no epidural steroid injections for this pain, she has received some injections for the lower back  Past Surgery:  no spinal surgeries  Rebecca Allen has no symptoms of cervical myelopathy.  The symptoms are causing a significant impact on the patient's life.   Review of Systems:  A 10 point review of systems is negative, except for the pertinent positives and negatives detailed in the HPI.  Past Medical History: Past Medical History:  Diagnosis Date   Anxiety    Depression    GERD (gastroesophageal reflux disease)    Grief at loss of child 2022   History of kidney stones    Hyperlipidemia    Kidney stone    Pneumonia     Treadmill stress test negative for angina pectoris 12/18/2010    Past Surgical History: Past Surgical History:  Procedure Laterality Date   ABDOMINAL HYSTERECTOMY  2001   with 1 ovary removed; remaining over removed a yr later   CHOLECYSTECTOMY  2002   COLONOSCOPY     EXTRACORPOREAL SHOCK WAVE LITHOTRIPSY Left 06/27/2020   Procedure: EXTRACORPOREAL SHOCK WAVE LITHOTRIPSY (ESWL);  Surgeon: Geraline Knapp, MD;  Location: [facility] ORS;  Service: Urology;  Laterality: Left;   UPPER GI ENDOSCOPY  2019    Allergies: Allergies as of 12/14/2023 - Review Complete 12/14/2023  Allergen Reaction Noted   Metoclopramide hcl     Morphine  and codeine  Nausea Only 05/25/2017   Venlafaxine  Other (See Comments) 08/24/2018    Medications: Outpatient Encounter Medications as of 12/14/2023  Medication Sig   ALPRAZolam  (XANAX ) 0.5 MG tablet Take 1 tablet (0.5 mg total) by mouth daily as needed for anxiety.   calcium carbonate (TUMS - DOSED IN MG ELEMENTAL CALCIUM) 500 MG chewable tablet Chew 6 tablets by mouth daily as needed for indigestion or heartburn.   clobetasol ointment (TEMOVATE) 0.05 % Apply topically 2 (two) times daily as needed.   estradiol  (VIVELLE -DOT) 0.05 MG/24HR patch PLACE 1 PATCH TRANS-DERMALLY TWICE A WEEK. REMOVE OLD PATCH BEFORE APPLYING NEW PATCH.   lidocaine  (XYLOCAINE ) 5 % ointment Apply 1 Application topically as needed.   ondansetron  (ZOFRAN ) 4 MG tablet Take 1 tablet (4 mg total) by mouth every 8 (eight) hours as needed for nausea or vomiting.   pantoprazole  (PROTONIX ) 40 MG tablet  Take 1 tablet (40 mg total) by mouth daily.   [DISCONTINUED] diclofenac  (VOLTAREN ) 75 MG EC tablet Take 1 tablet (75 mg total) by mouth 2 (two) times daily.   [DISCONTINUED] escitalopram  (LEXAPRO ) 10 MG tablet Take 1 tablet (10 mg total) by mouth daily.   [DISCONTINUED] gabapentin  (NEURONTIN ) 100 MG capsule Take 1 capsule (100 mg total) by mouth 3 (three) times daily.   [DISCONTINUED]  HYDROcodone -acetaminophen  (NORCO/VICODIN) 5-325 MG tablet Take 1 tablet by mouth every 8 (eight) hours as needed for moderate pain (pain score 4-6) or severe pain (pain score 7-10).   No facility-administered encounter medications on file as of 12/14/2023.    Social History: Social History   Tobacco Use   Smoking status: Some Days    Types: E-cigarettes   Smokeless tobacco: Never  Vaping Use   Vaping status: Every Day  Substance Use Topics   Alcohol use: Yes    Alcohol/week: 12.0 standard drinks of alcohol    Types: 12 Cans of beer per week    Comment: only on weekends. and liquor sometimes   Drug use: No    Family Medical History: Family History  Problem Relation Age of Onset   COPD Mother    Heart attack Father 64   Hypertension Father    Heart disease Father        stent placement    Congenital heart disease Sister    Leukemia Brother    Anxiety disorder Maternal Uncle    Alcohol abuse Maternal Uncle    Kidney failure Maternal Grandfather    COPD Paternal Grandmother    Breast cancer Neg Hx     Physical Examination: Today's Vitals   12/14/23 1016  BP: 114/74  Weight: 80.3 kg  Height: 5\' 5"  (1.651 m)  PainSc: 9   PainLoc: Back   Body mass index is 29.45 kg/m.  General: Patient is well developed, well nourished, calm, collected, and in no apparent distress. Attention to examination is appropriate.  Psychiatric: Patient is non-anxious.  Head:  Pupils equal, round, and reactive to light.  ENT:  Oral mucosa appears well hydrated.  Neck:   Supple.  Limited ROM  Respiratory: Patient is breathing without any difficulty.  Extremities: No edema.  Vascular: Palpable dorsal pedal pulses.  Skin:   On exposed skin, there are no abnormal skin lesions.  NEUROLOGICAL:     Awake, alert, oriented to person, place, and time.  Speech is clear and fluent. Fund of knowledge is appropriate.   Cranial Nerves: Pupils equal round and reactive to light.  Facial tone is  symmetric.  Facial sensation is symmetric.  ROM of spine: limited extension and rotation to the left  Palpation of spine: non tender.    Positive spurling's on the left.   MSP: TTP in left lateral neck and interscapular region.  Strength: Side Biceps Triceps Deltoid Interossei Grip Wrist Ext. Wrist Flex.  R 5 5 5 5 5 5 5   L 5 5 5 5 5 5 5    Side Iliopsoas Quads Hamstring PF DF EHL  R 5 5 5 5 5 5   L 5 5 5 5 5 5    Reflexes are 2+ and symmetric at the biceps, triceps, brachioradialis, patella and achilles.   Hoffman's is absent.  Clonus is not present.  Toes are down-going.  Bilateral upper and lower extremity sensation is intact to light touch.    Gait is normal.    Medical Decision Making  Imaging: 12/01/23 CT T spine FINDINGS: Alignment:  Normal.   Vertebrae: No acute fracture or focal pathologic process.   Paraspinal and other soft tissues: Negative.   Disc levels: No evidence of disc herniation or canal space-occupying lesions.   No abnormal enhancement after the intravenous administration of the contrast material.   IMPRESSION: Normal CT of the thoracic spine.     Electronically Signed   By: Fredrich Jefferson M.D.   On: 12/01/2023 16:19  I have personally reviewed the images and agree with the above interpretation.  Assessment and Plan: Ms. Cannone is a pleasant 58 y.o. female with symptoms concerning for cervical radiculopathy. We reviewed her cervical MRI from 2018 which does show a left-sided disc osteophyte complex at C5-6 which could be causing some of her symptoms.  Her thoracic workup was reassuring and her exam and constellation of symptoms do beg the question as to whether or not her degenerative disc disease in her cervical spine have worsened and are thus symptoms.  I recommended an updated cervical MRI as well as referral to physical therapy for consideration of myofascial release and dry needling.  She has had adverse reactions to Flexeril  and Robaxin  as  well as Gabapentin .  She is unable to tolerate steroids and anti-inflammatories and has had little improvement with known.  I recommended a trial of a low dose of Baclofen.  We discussed potential medication side effects and appropriate use.  I will see her back via telephone visit after completion of her cervical MRI to discuss the results and further plan of care.  She was instructed to call the office in the interim with any questions or concerns.  She expressed understanding and was in agreement with this plan.  Thank you for involving me in the care of this patient.   I spent a total of 58 minutes in both face-to-face and non-face-to-face activities for this visit on the date of this encounter including review of imaging, review of records, discussion of symptoms, discussion of differential diagnosis, order placement, and documentation.   Anastacio Karvonen Dept. of Neurosurgery

## 2023-12-14 ENCOUNTER — Ambulatory Visit: Admitting: Neurosurgery

## 2023-12-14 ENCOUNTER — Other Ambulatory Visit: Payer: Self-pay | Admitting: Family Medicine

## 2023-12-14 ENCOUNTER — Telehealth: Payer: Self-pay | Admitting: Neurosurgery

## 2023-12-14 ENCOUNTER — Encounter: Payer: Self-pay | Admitting: Neurosurgery

## 2023-12-14 VITALS — BP 114/74 | Ht 65.0 in | Wt 177.0 lb

## 2023-12-14 DIAGNOSIS — M5010 Cervical disc disorder with radiculopathy, unspecified cervical region: Secondary | ICD-10-CM

## 2023-12-14 DIAGNOSIS — M50122 Cervical disc disorder at C5-C6 level with radiculopathy: Secondary | ICD-10-CM

## 2023-12-14 MED ORDER — BACLOFEN 5 MG PO TABS
5.0000 mg | ORAL_TABLET | Freq: Three times a day (TID) | ORAL | 0 refills | Status: DC | PRN
Start: 2023-12-14 — End: 2023-12-16

## 2023-12-14 NOTE — Telephone Encounter (Signed)
 Patient is calling to request something to help with her anxiety during her MRI on 12/22/2023.   [facility]

## 2023-12-15 MED ORDER — ALPRAZOLAM 0.5 MG PO TABS: 0.5000 mg | ORAL_TABLET | Freq: Every day | ORAL | 0 refills | Status: DC | PRN

## 2023-12-15 NOTE — Telephone Encounter (Signed)
 Last office visit 11/29/23 with Dugal for back pain.  Last refilled 10/27/23 for #20 with no refills.  Next Appt: No future appointments with PCP.

## 2023-12-16 ENCOUNTER — Telehealth (INDEPENDENT_AMBULATORY_CARE_PROVIDER_SITE_OTHER): Admitting: Family Medicine

## 2023-12-16 ENCOUNTER — Telehealth: Payer: Self-pay | Admitting: Family Medicine

## 2023-12-16 ENCOUNTER — Ambulatory Visit: Payer: Self-pay

## 2023-12-16 ENCOUNTER — Encounter: Payer: Self-pay | Admitting: Family Medicine

## 2023-12-16 VITALS — Ht 65.0 in

## 2023-12-16 DIAGNOSIS — M5412 Radiculopathy, cervical region: Secondary | ICD-10-CM | POA: Diagnosis not present

## 2023-12-16 MED ORDER — HYDROCODONE-ACETAMINOPHEN 5-325 MG PO TABS: 1.0000 | ORAL_TABLET | Freq: Two times a day (BID) | ORAL | 0 refills | Status: DC | PRN

## 2023-12-16 NOTE — Telephone Encounter (Signed)
 Copied from CRM [id number]. Topic: Clinical - Medication Refill >> Dec 16, 2023  8:33 AM Jalayah J wrote: Medication: Hydrocodone   Has the patient contacted their pharmacy? Yes (Agent: If no, request that the patient contact the pharmacy for the refill. If patient does not wish to contact the pharmacy document the reason why and proceed with request.) (Agent: If yes, when and what did the pharmacy advise?)  This is the patient's preferred pharmacy:  [facility] [address], [place] - [address] [place] [address] Phone: [phone] Fax: [phone]    Is this the correct pharmacy for this prescription? Yes If no, delete pharmacy and type the correct one.   Has the prescription been filled recently? Yes  Is the patient out of the medication? Yes  Has the patient been seen for an appointment in the last year OR does the patient have an upcoming appointment? Yes  Can we respond through MyChart? Yes  Agent: Please be advised that Rx refills may take up to 3 business days. We ask that you follow-up with your pharmacy.

## 2023-12-16 NOTE — Telephone Encounter (Signed)
 Hydrocodone  is not on current medication list.  Patient has virtual visit today at 11:00 am for back pain.  Please address at that appointment.

## 2023-12-16 NOTE — Assessment & Plan Note (Signed)
 Acute flare of chronic issue  She is currently seeing neurosurgery and has been referred to physical therapy for possible dry needling and PT.  She has an upcoming MRI scheduled next week and neurosurgery is considering epidural steroid injections in cervical spine. Pain is persistent despite ibuprofen , gabapentin , Tylenol , prednisone , Flexeril , Robaxin  and hydrocodone .  She has the best relief with Tylenol  although it only lasts 3 hours.  She has side effects to ibuprofen  and gabapentin . Hydrocodone  has been helping with breakthrough pain. Offered trial of Lyrica but she is hesitant and would instead like to move forward with the MRI and will PT, possible epidural steroid injections.  We discussed the possible side effects to narcotic use.  She will use hydrocodone  5/325 twice daily as needed for breakthrough pain.  She will monitor her acetaminophen  level with max at 4000 mg daily.  If she has not had significant improvement in her pain at the end of 1 month she will make an appointment in office to be set up for chronic pain management with urine drug screen and pain contract.  We can again consider Lyrica trial at that time.  I have encouraged her to use lidocaine  patches and try a cervical collar to restrict neck movement.

## 2023-12-16 NOTE — Telephone Encounter (Signed)
 Rebecca Allen

## 2023-12-16 NOTE — Progress Notes (Signed)
 VIRTUAL VISIT A virtual visit is felt to be most appropriate for this patient at this time.   I connected with the patient on 12/16/23 at 11:00 AM EDT by virtual telehealth platform and verified that I am speaking with the correct person using two identifiers.   I discussed the limitations, risks, security and privacy concerns of performing an evaluation and management service by  virtual telehealth platform and the availability of in person appointments. I also discussed with the patient that there may be a patient responsible charge related to this service. The patient expressed understanding and agreed to proceed.  Patient location: Home Provider Location: [facility] [place] Participants: Herby Lolling and Alsie M Pancake   Chief Complaint  Patient presents with   Back Pain   Medication Refill    Hydrocodone -Not on current medication list    History of Present Illness:  58 y.o. female patient of Judithann Novas, MD presents with   Reviewed recent office visit Dec 14, 2023 with Anastacio Karvonen, PA neurosurgery for cervical disc disorder with radiculopathy. At that time patient had reported left-sided scapular upper back pain starting in March without an inciting event.  Pain was persistent despite ibuprofen , gabapentin , Tylenol , prednisone , Flexeril , Robaxin  and hydrocodone  Pain is exacerbated with sitting or turning over in bed which causes a grabbing catching pain in her shoulder blade and upper back. She was referred to physical therapy She has an MRI scheduled for next week  Considering ESI in neck.  She is made this office visit today for chronic pain management.  She is requesting a refill of hydrocodone . Tylenol   ES 2 tabs does not last more than 3 hours, not helping. Using heat, lidocaine  cream. Ibuprofen  not helping with the.  Had allergic reaction to gabapentin .  Indication for chronic opioid: upper back pain Medication and dose: Hydrocodone  5/325  NCCS[facility] reviewed  this encounter (include red flags): Yes  On review of PDMP last prescription from me for hydrocodone  was October 28, 2023 for 15 hydrocodone /acetaminophen  5/325 mg as well as she is on alprazolam  0.5 mg as needed. She is also had a prescription through the ED for 10 hydrocodone  on December 01, 2023.   Using alprazolam  but only as need 2 times a week.  COVID 19 screen No recent travel or known exposure to COVID19 The patient denies respiratory symptoms of COVID 19 at this time.  The importance of social distancing was discussed today.   Review of Systems  Constitutional:  Negative for chills and fever.  HENT:  Negative for congestion and ear pain.   Eyes:  Negative for pain and redness.  Respiratory:  Negative for cough and shortness of breath.   Cardiovascular:  Negative for chest pain, palpitations and leg swelling.  Gastrointestinal:  Negative for abdominal pain, blood in stool, constipation, diarrhea, nausea and vomiting.  Genitourinary:  Negative for dysuria.  Musculoskeletal:  Positive for neck pain. Negative for falls and myalgias.  Skin:  Negative for rash.  Neurological:  Negative for dizziness.  Psychiatric/Behavioral:  Negative for depression. The patient is not nervous/anxious.       Past Medical History:  Diagnosis Date   Anxiety    Depression    GERD (gastroesophageal reflux disease)    Grief at loss of child 2022   History of kidney stones    Hyperlipidemia    Kidney stone    Pneumonia    Treadmill stress test negative for angina pectoris 12/18/2010    reports that she has been  smoking e-cigarettes. She has never used smokeless tobacco. She reports current alcohol use of about 12.0 standard drinks of alcohol per week. She reports that she does not use drugs.   Current Outpatient Medications:    ALPRAZolam  (XANAX ) 0.5 MG tablet, Take 1 tablet (0.5 mg total) by mouth daily as needed for anxiety., Disp: 20 tablet, Rfl: 0   calcium carbonate (TUMS - DOSED IN MG ELEMENTAL  CALCIUM) 500 MG chewable tablet, Chew 6 tablets by mouth daily as needed for indigestion or heartburn., Disp: , Rfl:    clobetasol ointment (TEMOVATE) 0.05 %, Apply topically 2 (two) times daily as needed., Disp: , Rfl:    estradiol  (VIVELLE -DOT) 0.05 MG/24HR patch, PLACE 1 PATCH TRANS-DERMALLY TWICE A WEEK. REMOVE OLD PATCH BEFORE APPLYING NEW PATCH., Disp: 24 patch, Rfl: 0   HYDROcodone -acetaminophen  (NORCO/VICODIN) 5-325 MG tablet, Take 1 tablet by mouth every 12 (twelve) hours as needed for moderate pain (pain score 4-6)., Disp: 60 tablet, Rfl: 0   lidocaine  (XYLOCAINE ) 5 % ointment, Apply 1 Application topically as needed., Disp: 35.44 g, Rfl: 0   Lidocaine -Adhesive Sheets (LIDOPURE PATCH ) 5 % KIT, Apply topically., Disp: , Rfl:    ondansetron  (ZOFRAN ) 4 MG tablet, Take 1 tablet (4 mg total) by mouth every 8 (eight) hours as needed for nausea or vomiting., Disp: 20 tablet, Rfl: 0   pantoprazole  (PROTONIX ) 40 MG tablet, Take 1 tablet (40 mg total) by mouth daily., Disp: 90 tablet, Rfl: 1   Observations/Objective: Height 5\' 5"  (1.651 m).  Physical Exam Constitutional:      General: The patient is not in acute distress. Pulmonary:     Effort: Pulmonary effort is normal. No respiratory distress.  Neurological:     Mental Status: The patient is alert and oriented to person, place, and time.  Psychiatric:        Mood and Affect: Mood normal.        Behavior: Behavior normal.    Assessment and Plan Chronic cervical radiculopathy Assessment & Plan: Acute flare of chronic issue  She is currently seeing neurosurgery and has been referred to physical therapy for possible dry needling and PT.  She has an upcoming MRI scheduled next week and neurosurgery is considering epidural steroid injections in cervical spine. Pain is persistent despite ibuprofen , gabapentin , Tylenol , prednisone , Flexeril , Robaxin  and hydrocodone .  She has the best relief with Tylenol  although it only lasts 3 hours.  She  has side effects to ibuprofen  and gabapentin . Hydrocodone  has been helping with breakthrough pain. Offered trial of Lyrica but she is hesitant and would instead like to move forward with the MRI and will PT, possible epidural steroid injections.  We discussed the possible side effects to narcotic use.  She will use hydrocodone  5/325 twice daily as needed for breakthrough pain.  She will monitor her acetaminophen  level with max at 4000 mg daily.  If she has not had significant improvement in her pain at the end of 1 month she will make an appointment in office to be set up for chronic pain management with urine drug screen and pain contract.  We can again consider Lyrica trial at that time.  I have encouraged her to use lidocaine  patches and try a cervical collar to restrict neck movement.   Other orders -     HYDROcodone -Acetaminophen ; Take 1 tablet by mouth every 12 (twelve) hours as needed for moderate pain (pain score 4-6).  Dispense: 60 tablet; Refill: 0      I discussed the assessment  and treatment plan with the patient. The patient was provided an opportunity to ask questions and all were answered. The patient agreed with the plan and demonstrated an understanding of the instructions.   The patient was advised to call back or seek an in-person evaluation if the symptoms worsen or if the condition fails to improve as anticipated.     Herby Lolling, MD

## 2023-12-16 NOTE — Telephone Encounter (Signed)
  Chief Complaint: Back Pain  Symptoms: Neck Pain, Back Pain  Frequency: Since March 6th  Pertinent Negatives: Patient denies incontinence  Disposition: [] ED /[] Urgent Care (no appt availability in office) / [x] Appointment(In office/virtual)/ []  [facility]/ [] Home Care/ [] Refused Recommended Disposition /[] [facility]/ []  Follow-up with PCP Additional Notes: TC is being triaged for back pain that has been going on since March 6th. The patient is requesting a refill on Hydrocodone .   Reason for Disposition  [1] MODERATE back pain (e.g., interferes with normal activities) AND [2] present > 3 days  Answer Assessment - Initial Assessment Questions 1. ONSET: "When did the pain begin?"      March 6th  2. LOCATION: "Where does it hurt?" (upper, mid or lower back)      C4, C5 3. SEVERITY: "How bad is the pain?"  (e.g., Scale 1-10; mild, moderate, or severe)   - MILD (1-3): Doesn't interfere with normal activities.    - MODERATE (4-7): Interferes with normal activities or awakens from sleep.    - SEVERE (8-10): Excruciating pain, unable to do any normal activities.      Moderate to Severe  4. PATTERN: "Is the pain constant?" (e.g., yes, no; constant, intermittent)      Constant  5. RADIATION: "Does the pain shoot into your legs or somewhere else?"     Neck on the left side  6. CAUSE:  "What do you think is causing the back pain?"      According to Neurology  7. BACK OVERUSE:  "Any recent lifting of heavy objects, strenuous work or exercise?"     NO  8. MEDICINES: "What have you taken so far for the pain?" (e.g., nothing, acetaminophen , NSAIDS)     Ibuprofen , Tylenol , Baclofen  9. NEUROLOGIC SYMPTOMS: "Do you have any weakness, numbness, or problems with bowel/bladder control?"      No  10. OTHER SYMPTOMS: "Do you have any other symptoms?" (e.g., fever, abdomen pain, burning with urination, blood in urine)       Neck Pain  11. PREGNANCY: "Is there any  chance you are pregnant?" "When was your last menstrual period?"       No and No  Protocols used: Back Pain-A-AH

## 2023-12-17 ENCOUNTER — Other Ambulatory Visit: Payer: Self-pay | Admitting: Neurosurgery

## 2023-12-17 MED ORDER — DIAZEPAM 2 MG PO TABS: 2.0000 mg | ORAL_TABLET | Freq: Once | ORAL | 0 refills | Status: DC | PRN

## 2023-12-22 ENCOUNTER — Encounter: Payer: Self-pay | Admitting: *Deleted

## 2023-12-22 ENCOUNTER — Ambulatory Visit
Admission: RE | Admit: 2023-12-22 | Discharge: 2023-12-22 | Disposition: A | Discharge status: Home / Self Care | Source: Ambulatory Visit | Attending: Neurosurgery | Admitting: Neurosurgery

## 2023-12-22 ENCOUNTER — Other Ambulatory Visit: Payer: Self-pay | Admitting: Family Medicine

## 2023-12-22 DIAGNOSIS — Z79890 Hormone replacement therapy (postmenopausal): Secondary | ICD-10-CM

## 2023-12-22 DIAGNOSIS — M5010 Cervical disc disorder with radiculopathy, unspecified cervical region: Secondary | ICD-10-CM | POA: Insufficient documentation

## 2023-12-28 ENCOUNTER — Telehealth: Payer: Self-pay | Admitting: Neurosurgery

## 2023-12-28 ENCOUNTER — Other Ambulatory Visit: Payer: Self-pay | Admitting: Neurosurgery

## 2023-12-28 MED ORDER — TIZANIDINE HCL 2 MG PO CAPS: 2.0000 mg | ORAL_CAPSULE | Freq: Three times a day (TID) | ORAL | 0 refills | Status: DC | PRN

## 2023-12-28 MED ORDER — CELECOXIB 100 MG PO CAPS: 100.0000 mg | ORAL_CAPSULE | Freq: Two times a day (BID) | ORAL | 0 refills | Status: DC

## 2023-12-28 NOTE — Telephone Encounter (Signed)
 I notified the patient that Danielle sent in tizanidine  and celebrex. I advised her that tizanidine  can make her sleepy, so she may want to try it at night at first. I also advised her not to take celebrex with any other antiinflammatories such as ibuprofen , aleve , naproxen , etc. She verbalized understanding.

## 2023-12-28 NOTE — Telephone Encounter (Signed)
 Hydrocodine is not relieving her pain. She can not take it while working any ways. She needs pain medication that she is able to take and that will help.  She can not come in on 01/18/24 because she will be out of town from 01/14/2024 through 02/02/2024. Do you feel she should move forward with dry needling. Physical therapy did call her to schedule an appt but she wanted her results first to make sure that she does not jeopardize her back even more.   Can you just call her with the results when they come in?

## 2023-12-28 NOTE — Telephone Encounter (Signed)
  Media Information   Document Information  MRI was on 12/22/23, no report yet.

## 2023-12-28 NOTE — Telephone Encounter (Signed)
 Patient is calling that she would like her MRI results because she is having excruciating pain all over her back and neck. She is taking hydrocodone  that does not work. She uses head and cold on her back. She takes tylenol  for the breakthrough pain. 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 Can her results be expedited please.

## 2023-12-28 NOTE — Telephone Encounter (Signed)
 Patient is having ongoing but worsening pain as of yesterday mid afternoon. She denies any injury to this area or turning wrong. She states that she is in a lot of pain, states that it is 10+/10 pain in mid back like Stabbing knife pain   Muscle relaxers did not help previously and made her super sleepy.   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 Can we take her out of work for one week for pain control? She would try a different opioid medication while not working.

## 2023-12-28 NOTE — Telephone Encounter (Signed)
 Will call patient back at 4:30pm, she is currently at work.

## 2023-12-28 NOTE — Telephone Encounter (Signed)
 Per Diego Foy,   Can call reading room to expedite however result seems unremarkable in relation to patients current symptoms.  Offer patient follow up appointment to discuss back pain  Out of work note for one week for pain concerns

## 2023-12-28 NOTE — Telephone Encounter (Signed)
 June 10th is your next opening and she has been added to the cancellation list.

## 2023-12-28 NOTE — Telephone Encounter (Signed)
 I called radiology and requested the report be expedited because the patient is in severe pain.

## 2023-12-30 ENCOUNTER — Ambulatory Visit (INDEPENDENT_AMBULATORY_CARE_PROVIDER_SITE_OTHER): Admitting: Neurosurgery

## 2023-12-30 DIAGNOSIS — M4722 Other spondylosis with radiculopathy, cervical region: Secondary | ICD-10-CM | POA: Diagnosis not present

## 2023-12-30 DIAGNOSIS — M5010 Cervical disc disorder with radiculopathy, unspecified cervical region: Secondary | ICD-10-CM

## 2023-12-30 NOTE — Progress Notes (Signed)
 Neurosurgery Telephone (Audio-Only) Note  Requesting Provider     Judithann Novas, MD [address] [place],  [place] [address] T: [phone] F: [phone]  Primary Care Provider Judithann Novas, MD [address] [place] [address] T: [phone] F: [phone]  Telehealth visit was conducted with Rebecca Allen, a 58 y.o. female via telephone.  History of Present Illness: Rebecca Allen is a 58 y.o presenting today via telephone visit to review her recent MRI results. Unfortunately she has continued to have neck pain and is now having pain into her mid and lower.  She also endorses new but intermittent radiating left arm pain with associated numbness and tingling in her hand.  This is significantly affecting her ability to work and function.  She was seen for PT today and underwent dry needling for her numbness.  12/14/2023 Rebecca Allen is a 58 y.o with a history of HLD, anxiety, GERD who is here today with a chief complaint of left sided scapular/ upper back pain.  She states that this started in March without inciting event and persistent despite ibuprofen , Gabapentin  (which she had a bad reaction to), tylenol , prednisone , Flexeril , and Robaxin  and Hydrocodone .  Despite multiple medication she has not had any significant improvement in her symptoms.  They are significantly impacting her quality of life.  In addition to the symptoms mentioned above she does have some left-sided neck pain and occipital cervical headaches.  She states that her symptoms are constant but do wax and wane in severity.  They seem to be exacerbated with sitting or turning over in bed which seems to cause a grabbing catching pain in her shoulder blade and upper back and improved some with standing and heat.   Conservative measures:  Physical therapy:  has not participated in PT Multimodal medical therapy including regular antiinflammatories:  tylenol , hydrocodone , ibuprofen ,  prednisone , diclofenac , gabapentin  Injections: no epidural steroid injections for this pain, she has received some injections for the lower back   Past Surgery:  no spinal surgeries   Rebecca Allen has no symptoms of cervical myelopathy.   The symptoms are causing a significant impact on the patient's life.  General Review of Systems:  A ROS was performed including pertinent positive and negatives as documented.  All other systems are negative.   Prior to Admission medications   Medication Sig Start Date End Date Taking? Authorizing Provider  celecoxib (CELEBREX) 100 MG capsule Take 1 capsule (100 mg total) by mouth 2 (two) times daily. 12/28/23   Noble Bateman, PA  tizanidine  (ZANAFLEX ) 2 MG capsule Take 1 capsule (2 mg total) by mouth 3 (three) times daily as needed for muscle spasms. 12/28/23   Noble Bateman, PA  ALPRAZolam  (XANAX ) 0.5 MG tablet Take 1 tablet (0.5 mg total) by mouth daily as needed for anxiety. 12/15/23   Bedsole, Amy E, MD  calcium carbonate (TUMS - DOSED IN MG ELEMENTAL CALCIUM) 500 MG chewable tablet Chew 6 tablets by mouth daily as needed for indigestion or heartburn.    [provider]  clobetasol ointment (TEMOVATE) 0.05 % Apply topically 2 (two) times daily as needed. 03/21/23   [provider]  diazepam  (VALIUM ) 2 MG tablet Take 1 tablet (2 mg total) by mouth once as needed for up to 2 doses for anxiety (take 30-45 minutes prior to MRI. OK to repeat x 1). 12/17/23   Noble Bateman, PA  estradiol  (VIVELLE -DOT) 0.05 MG/24HR patch PLACE 1  PATCH TRANS-DERMALLY TWICE A WEEK. REMOVE OLD PATCH BEFORE APPLYING NEW PATCH. 12/22/23   Judithann Novas, MD  HYDROcodone -acetaminophen  (NORCO/VICODIN) 5-325 MG tablet Take 1 tablet by mouth every 12 (twelve) hours as needed for moderate pain (pain score 4-6). 12/16/23   Bedsole, Amy E, MD  lidocaine  (XYLOCAINE ) 5 % ointment Apply 1 Application topically as needed. 11/29/23   Dugal, Tabitha, FNP   Lidocaine -Adhesive Sheets (LIDOPURE PATCH ) 5 % KIT Apply topically.    [provider]  ondansetron  (ZOFRAN ) 4 MG tablet Take 1 tablet (4 mg total) by mouth every 8 (eight) hours as needed for nausea or vomiting. 10/28/23   Bedsole, Amy E, MD  pantoprazole  (PROTONIX ) 40 MG tablet Take 1 tablet (40 mg total) by mouth daily. 06/22/23   Judithann Novas, MD    DATA REVIEWED    Imaging Studies  12/22/23 MRI C spine  FINDINGS: Alignment: Reversal of cervical lordosis with slight C3-4 anterolisthesis.   Vertebrae: No fracture, evidence of discitis, or bone lesion.   Cord: Normal signal and morphology.   Posterior Fossa, vertebral arteries, paraspinal tissues: Negative.   Disc levels:   C2-3: Bulky degenerative facet spurring asymmetric to the left.   C3-4: Prominent degenerative facet spurring. Tiny central protrusion. No neural impingement.   C4-5: Bulky degenerative facet spurring eccentric to the left. Asymmetric left uncovertebral ridging with moderate left foraminal stenosis.   C5-6: Disc narrowing and bulging with uncovertebral ridging eccentric to the right where there is severe foraminal degeneration with bulky spurring and subchondral cysts. Advanced right foraminal impingement.   C6-7: Mild disc bulging.  Moderate degenerative facet spurring.   C7-T1:Mild degenerative facet spurring asymmetric to the right.   IMPRESSION: Generalized cervical spine degeneration especially affecting facets.   Foraminal impingement on the left at C4-5 and right at C5-6.   Widely patent spinal canal.   No acute finding.     Electronically Signed   By: Ronnette Coke M.D.   On: 12/29/2023 11:13  IMPRESSION  Rebecca Allen is a 58 y.o. female who I performed a telephone encounter today for evaluation and management of cervical spondylosis with radiculopathy.   PLAN  Rebecca Allen is a 58 y.o presenting today via telephone visit to review her MRI results.   Unfortunately she has had worsening symptoms since her last visit and these are affecting her ability to work.  We reviewed her MRI results which show multilevel degenerative changes throughout her cervical spine with foraminal stenosis which likely explains her symptoms.  I recommended an epidural steroid injection as these have been helpful for her in the past.  She was last seen for her lumbar spine by Dr. Rexanne Catalina in [place] but would like to see someone locally.  I placed a referral to Dr. Baldwin Bones at her request.  I recommended that she continue with physical therapy and dry needling as indicated.  I will see her back in person in 6 to 8 weeks to evaluate her progress.  No orders of the defined types were placed in this encounter.  DISPOSITION  Follow up: In person appointment in 6 weeks  Noble Bateman, PA Neurosurgery   TELEPHONE DOCUMENTATION   This visit was performed via telephone.  Patient location: home Provider location: office  I spent a total of 5 minutes non-face-to-face activities for this visit on the date of this encounter including review of current clinical condition and response to treatment.  The patient is aware of and accepts the limits of this telehealth  visit.

## 2024-01-05 ENCOUNTER — Telehealth (INDEPENDENT_AMBULATORY_CARE_PROVIDER_SITE_OTHER): Admitting: Family Medicine

## 2024-01-05 ENCOUNTER — Encounter: Payer: Self-pay | Admitting: Family Medicine

## 2024-01-05 VITALS — Ht 65.0 in | Wt 170.0 lb

## 2024-01-05 DIAGNOSIS — J209 Acute bronchitis, unspecified: Secondary | ICD-10-CM

## 2024-01-05 MED ORDER — AZITHROMYCIN 250 MG PO TABS: ORAL_TABLET | ORAL | 0 refills | Status: DC

## 2024-01-05 MED ORDER — PREDNISONE 10 MG PO TABS: ORAL_TABLET | ORAL | 0 refills | Status: DC

## 2024-01-05 NOTE — Assessment & Plan Note (Signed)
 Acute, symptoms ongoing greater than 7 to 10 days.  Likely initial viral infection now with concern for bacterial superinfection in patient with smoking history and possible chronic bronchitis. Will treat with antibiotics: Azithromycin  5-day course. Given some shortness of breath and coughing fits we will also treat with low-dose prednisone  taper. She will continue Delsym but will let me know if she is still having trouble sleeping at night for possible prescription cough suppressant. Recommend rest and fluids.  Go to ER if severe shortness of breath.  ER and return precautions reviewed in detail.

## 2024-01-05 NOTE — Progress Notes (Signed)
 VIRTUAL VISIT A virtual visit is felt to be most appropriate for this patient at this time.   I connected with the patient on 01/05/24 at 10:20 AM EDT by virtual telehealth platform and verified that I am speaking with the correct person using two identifiers.   I discussed the limitations, risks, security and privacy concerns of performing an evaluation and management service by  virtual telehealth platform and the availability of in person appointments. I also discussed with the patient that there may be a patient responsible charge related to this service. The patient expressed understanding and agreed to proceed.  Patient location: Home Provider Location: [facility] Participants: Rebecca Allen and Shacola M Ogata   Chief Complaint  Patient presents with   Nasal Congestion    Ongoing for about 2 weeks. Mucinex , deslym and lots of tea, honey and lemon. But its not helping. Dark phlegm     History of Present Illness:  58 y.o. female patient of Javier Gell E, MD presents with  new onset nasal congestion   Date of onset:  2 weeks Initial symptoms included  scratchy throat, dry cough Symptoms progressed to nasal congestion, productive cough.. dark phlegm  No face pain or ear pain  No fever  Mild SOB with coughing.  No wheeze.  Cough keeping her up at night   Sick contacts:  none COVID testing:   none     She has tried to treat with  OTC cold and allergy meds, Delsym and honey water.     No history of chronic lung disease such as asthma or  but frequent bronchitis, possible COPD. Former smoker, vapes.    COVID 19 screen No recent travel or known exposure to COVID19 The patient denies respiratory symptoms of COVID 19 at this time.  The importance of social distancing was discussed today.   Review of Systems  Constitutional:  Negative for chills and fever.  HENT:  Positive for congestion. Negative for ear pain.   Eyes:  Negative for pain and redness.   Respiratory:  Positive for cough and shortness of breath.   Cardiovascular:  Negative for chest pain, palpitations and leg swelling.  Gastrointestinal:  Negative for abdominal pain, blood in stool, constipation, diarrhea, nausea and vomiting.  Genitourinary:  Negative for dysuria.  Musculoskeletal:  Negative for falls and myalgias.  Skin:  Negative for rash.  Neurological:  Negative for dizziness.  Psychiatric/Behavioral:  Negative for depression. The patient is not nervous/anxious.       Past Medical History:  Diagnosis Date   Anxiety    Depression    GERD (gastroesophageal reflux disease)    Grief at loss of child 2022   History of kidney stones    Hyperlipidemia    Kidney stone    Pneumonia    Treadmill stress test negative for angina pectoris 12/18/2010    reports that she has been smoking e-cigarettes. She has never used smokeless tobacco. She reports current alcohol use of about 12.0 standard drinks of alcohol per week. She reports that she does not use drugs.   Current Outpatient Medications:    ALPRAZolam  (XANAX ) 0.5 MG tablet, Take 1 tablet (0.5 mg total) by mouth daily as needed for anxiety., Disp: 20 tablet, Rfl: 0   calcium carbonate (TUMS - DOSED IN MG ELEMENTAL CALCIUM) 500 MG chewable tablet, Chew 6 tablets by mouth daily as needed for indigestion or heartburn., Disp: , Rfl:    celecoxib (CELEBREX) 100 MG capsule, Take 1 capsule (100  mg total) by mouth 2 (two) times daily., Disp: 60 capsule, Rfl: 0   clobetasol ointment (TEMOVATE) 0.05 %, Apply topically 2 (two) times daily as needed., Disp: , Rfl:    diazepam  (VALIUM ) 2 MG tablet, Take 1 tablet (2 mg total) by mouth once as needed for up to 2 doses for anxiety (take 30-45 minutes prior to MRI. OK to repeat x 1)., Disp: 30 tablet, Rfl: 0   estradiol  (VIVELLE -DOT) 0.05 MG/24HR patch, PLACE 1 PATCH TRANS-DERMALLY TWICE A WEEK. REMOVE OLD PATCH BEFORE APPLYING NEW PATCH., Disp: 24 patch, Rfl: 0   HYDROcodone -acetaminophen   (NORCO/VICODIN) 5-325 MG tablet, Take 1 tablet by mouth every 12 (twelve) hours as needed for moderate pain (pain score 4-6)., Disp: 60 tablet, Rfl: 0   lidocaine  (XYLOCAINE ) 5 % ointment, Apply 1 Application topically as needed., Disp: 35.44 g, Rfl: 0   Lidocaine -Adhesive Sheets (LIDOPURE PATCH ) 5 % KIT, Apply topically., Disp: , Rfl:    ondansetron  (ZOFRAN ) 4 MG tablet, Take 1 tablet (4 mg total) by mouth every 8 (eight) hours as needed for nausea or vomiting., Disp: 20 tablet, Rfl: 0   pantoprazole  (PROTONIX ) 40 MG tablet, Take 1 tablet (40 mg total) by mouth daily., Disp: 90 tablet, Rfl: 1   tizanidine  (ZANAFLEX ) 2 MG capsule, Take 1 capsule (2 mg total) by mouth 3 (three) times daily as needed for muscle spasms., Disp: 90 capsule, Rfl: 0   Observations/Objective: Height 5\' 5"  (1.651 m), weight 170 lb (77.1 kg).  Physical Exam Constitutional:      General: The patient is not in acute distress. Pulmonary:     Effort: Pulmonary effort is normal. No respiratory distress.  Neurological:     Mental Status: The patient is alert and oriented to person, place, and time.  Psychiatric:        Mood and Affect: Mood normal.        Behavior: Behavior normal.    Assessment and Plan There are no diagnoses linked to this encounter.    I discussed the assessment and treatment plan with the patient. The patient was provided an opportunity to ask questions and all were answered. The patient agreed with the plan and demonstrated an understanding of the instructions.   The patient was advised to call back or seek an in-person evaluation if the symptoms worsen or if the condition fails to improve as anticipated.     Rebecca Lolling, MD

## 2024-01-18 ENCOUNTER — Ambulatory Visit: Admitting: Neurosurgery

## 2024-01-20 ENCOUNTER — Other Ambulatory Visit: Payer: Self-pay | Admitting: Neurosurgery

## 2024-02-17 ENCOUNTER — Encounter: Payer: Self-pay | Admitting: Neurosurgery

## 2024-02-17 ENCOUNTER — Ambulatory Visit: Admitting: Neurosurgery

## 2024-02-17 VITALS — BP 102/64 | Ht 65.0 in | Wt 170.0 lb

## 2024-02-17 DIAGNOSIS — M5412 Radiculopathy, cervical region: Secondary | ICD-10-CM | POA: Diagnosis not present

## 2024-02-17 DIAGNOSIS — M5010 Cervical disc disorder with radiculopathy, unspecified cervical region: Secondary | ICD-10-CM

## 2024-02-17 NOTE — Progress Notes (Signed)
 Progress Note: Referring Physician:  No referring provider defined for this encounter.  Primary Physician:  Avelina Greig BRAVO, MD  Chief Complaint:  f/u after PT and dry needling   History of Present Illness: Rebecca Allen is a 58 y.o. female who presents today for follow up after PT. She has undergone dry needling and myofascial release which has drastically improved her neck and arm pain.  She was not able to undergo an epidural steroid injection due to a recent diagnosis of pneumonia requiring steroids and antibiotics.  She is doing well at this time.  12/30/23 telephone visit Rebecca Allen is a 58 y.o presenting today via telephone visit to review her recent MRI results. Unfortunately she has continued to have neck pain and is now having pain into her mid and lower.  She also endorses new but intermittent radiating left arm pain with associated numbness and tingling in her hand.  This is significantly affecting her ability to work and function.  She was seen for PT today and underwent dry needling for her numbness.   12/14/2023 Rebecca Allen is a 58 y.o with a history of HLD, anxiety, GERD who is here today with a chief complaint of left sided scapular/ upper back pain.  She states that this started in March without inciting event and persistent despite ibuprofen , Gabapentin  (which she had a bad reaction to), tylenol , prednisone , Flexeril , and Robaxin  and Hydrocodone .  Despite multiple medication she has not had any significant improvement in her symptoms.  They are significantly impacting her quality of life.  In addition to the symptoms mentioned above she does have some left-sided neck pain and occipital cervical headaches.  She states that her symptoms are constant but do wax and wane in severity.  They seem to be exacerbated with sitting or turning over in bed which seems to cause a grabbing catching pain in her shoulder blade and upper back and improved some with standing  and heat.   Conservative measures:  Physical therapy:  has not participated in PT Multimodal medical therapy including regular antiinflammatories:  tylenol , hydrocodone , ibuprofen , prednisone , diclofenac , gabapentin  Injections: no epidural steroid injections for this pain, she has received some injections for the lower back   Past Surgery:  no spinal surgeries   Rebecca Allen has no symptoms of cervical myelopathy.   The symptoms are causing a significant impact on the patient's life.  Exam: Today's Vitals   02/17/24 0805  BP: 102/64  Weight: 77.1 kg  Height: 5' 5 (1.651 m)  PainSc: 2   PainLoc: Neck   Body mass index is 28.29 kg/m.    NEUROLOGICAL:  General: In no acute distress.   Awake, alert, oriented to person, place, and time.  Pupils equal round and reactive to light.  Facial tone is symmetric.   ROM of spine: full.  Palpation of spine: nontender.    Strength: Side Biceps Triceps Deltoid Interossei Grip Wrist Ext. Wrist Flex.  R 5 5 5 5 5 5 5   L 5 5 5 5 5 5 5      Imaging: No interval imaging to review  Assessment and Plan: Rebecca Allen is a pleasant 58 y.o. female with cervical radiculopathy. She has had improvement with therapy and dry needling.  We discussed the utility of an epidural steroid injection however she is not particularly symptomatic.  I recommended that she hold off at this time.  If her symptoms return we discussed sending her back to Dr. Avanell for injection.  She was encouraged to call the office should she have any questions or concerns. I will otherwise see her going forward on an as-needed basis.  She expressed understanding and was in agreement with this plan.  I spent a total of 15 minutes in both face-to-face and non-face-to-face activities for this visit on the date of this encounter including review of symptoms, discussion of treatment, physical exam and documentation.   Edsel Goods PA-C Neurosurgery

## 2024-02-28 ENCOUNTER — Ambulatory Visit: Payer: Self-pay

## 2024-02-28 NOTE — Telephone Encounter (Signed)
 Pt scheduled appt for tomorrow - will see then.

## 2024-02-28 NOTE — Telephone Encounter (Signed)
 FYI Only or Action Required?: FYI only for provider.  Patient was last seen in primary care on 01/05/2024 by Avelina Greig BRAVO, MD.  Called Nurse Triage reporting Fatigue.  Symptoms began several weeks ago.  Interventions attempted: Rest, hydration, or home remedies.  Symptoms are: unchanged.  Triage Disposition: See PCP When Office is Open (Within 3 Days)  Patient/caregiver understands and will follow disposition?: Yes      Copied from CRM [id number]. Topic: Clinical - Red Word Triage >> Feb 28, 2024 11:05 AM Harlene ORN wrote: Red Word that prompted transfer to Nurse Triage: needs med refill (Xanax ) recheck her iron  feeling very fatigued Has to take breaks when driving (could be a few minute drive from home to the store and would have to take a break inbetween to rest) Reason for Disposition  [1] MODERATE weakness (e.g., interferes with work, school, normal activities) AND [2] cause unknown  (Exceptions: Weakness from acute minor illness or poor fluid intake; weakness is chronic and not worse.)  Answer Assessment - Initial Assessment Questions 1. DESCRIPTION: Describe how you are feeling.     Very fatigue 2. SEVERITY: How bad is it?  Can you stand and walk?     Able to stand and walk 3. ONSET: When did these symptoms begin? (e.g., hours, days, weeks, months)     X few weeks 4. CAUSE: What do you think is causing the weakness or fatigue? (e.g., not drinking enough fluids, medical problem, trouble sleeping)     unknown 5. NEW MEDICINES:  Have you started on any new medicines recently? (e.g., opioid pain medicines, benzodiazepines, muscle relaxants, antidepressants, antihistamines, neuroleptics, beta blockers)     denies 6. OTHER SYMPTOMS: Do you have any other symptoms? (e.g., chest pain, fever, cough, SOB, vomiting, diarrhea, bleeding, other areas of pain)     Mild SOB with overexertion only Endorses cycles of diarrhea/constipation - ongoing for years 7. PREGNANCY:  Is there any chance you are pregnant? When was your last menstrual period?     N/a    Scheduled patient on the next available acute appt on February 29, 2024 with Dr. Rilla per pt preference.  Protocols used: Weakness (Generalized) and Fatigue-A-AH

## 2024-02-29 ENCOUNTER — Encounter: Payer: Self-pay | Admitting: Family Medicine

## 2024-02-29 ENCOUNTER — Ambulatory Visit: Admitting: Family Medicine

## 2024-02-29 VITALS — BP 120/82 | HR 70 | Temp 98.9°F | Ht 65.0 in | Wt 181.2 lb

## 2024-02-29 DIAGNOSIS — R5383 Other fatigue: Secondary | ICD-10-CM

## 2024-02-29 MED ORDER — ALPRAZOLAM 0.5 MG PO TABS: 0.5000 mg | ORAL_TABLET | Freq: Every day | ORAL | 0 refills | Status: DC | PRN

## 2024-02-29 NOTE — Patient Instructions (Addendum)
 Labs today to check for reversible causes of fatigue  Sleep questionnaire today  Incorporate regular exercise -both cardio and strength training - into your routine.

## 2024-02-29 NOTE — Progress Notes (Signed)
 Ph: [phone] Fax: [phone]   Patient ID: Rebecca Allen, female    DOB: Oct 17, 1965, 59 y.o.   MRN: [id number]  This visit was conducted in person.  BP 120/82   Pulse 70   Temp 98.9 F (37.2 C) (Oral)   Ht 5' 5 (1.651 m)   Wt 181 lb 4 oz (82.2 kg)   SpO2 100%   BMI 30.16 kg/m    CC: I've been really tired lately Subjective:   HPI: Rebecca Allen is a 58 y.o. female presenting on 02/29/2024 for Fatigue (Pt states fatigue has been going on for a month.)   Patient of Dr Sherrel new to me who  has a past medical history of Anxiety, Depression, GERD (gastroesophageal reflux disease), Grief at loss of child (2022), History of kidney stones, Hyperlipidemia, Kidney stone, Pneumonia, and Treadmill stress test negative for angina pectoris (12/18/2010).   Presents with 1 month history of fatigue described as daytime somnolence and tired. Averaging 7-8 hours of sleep, but despite this feels like she could go right back to sleep.   No caffeine - causes heart racing.   No fevers/chills, night sweats, cough, chest pain, dyspnea, nausea, headaches, dizziness, palpitations, new rash.  H/o IBS  Seeing PT with dry needling for bulging disc in neck.   No known tick bites.  No sick contacts at home.   Non-restorative sleep. + snoring. + daytime somnolence.  No morning headaches, PNdyspnea.  Had remote sleep study done about 15 yrs ago - no records available. Told no sleep apnea.   LMP - s/p hysterectomy at age 42yo for fibroids, L ovary remained then removed a year later. She did receive hormone therapy since then, continues estrogen Vivelle -dot patch.   1 wk ago she did start [employer]'s probiotic 50 billion CFU, as well as Cholestene with RYR 1200mg .   On pantoprazole  40mg  daily.   No lifestyle changes, no diet changes recently.  No recent change in activity routine.  Requests xanax  refilled as she's leaving town for 2 wk vacation and is running out - pcp  is out of office this week. Will refill #20 x1.      Relevant past medical, surgical, family and social history reviewed and updated as indicated. Interim medical history since our last visit reviewed. Allergies and medications reviewed and updated. Outpatient Medications Prior to Visit  Medication Sig Dispense Refill   calcium carbonate (TUMS - DOSED IN MG ELEMENTAL CALCIUM) 500 MG chewable tablet Chew 6 tablets by mouth daily as needed for indigestion or heartburn.     clobetasol ointment (TEMOVATE) 0.05 % Apply topically 2 (two) times daily as needed.     estradiol  (VIVELLE -DOT) 0.05 MG/24HR patch PLACE 1 PATCH TRANS-DERMALLY TWICE A WEEK. REMOVE OLD PATCH BEFORE APPLYING NEW PATCH. 24 patch 0   lidocaine  (XYLOCAINE ) 5 % ointment Apply 1 Application topically as needed. 35.44 g 0   Lidocaine -Adhesive Sheets (LIDOPURE PATCH ) 5 % KIT Apply topically.     ondansetron  (ZOFRAN ) 4 MG tablet Take 1 tablet (4 mg total) by mouth every 8 (eight) hours as needed for nausea or vomiting. 20 tablet 0   pantoprazole  (PROTONIX ) 40 MG tablet Take 1 tablet (40 mg total) by mouth daily. 90 tablet 1   tizanidine  (ZANAFLEX ) 2 MG capsule Take 1 capsule (2 mg total) by mouth 3 (three) times daily as needed for muscle spasms. 90 capsule 0   ALPRAZolam  (XANAX ) 0.5 MG tablet Take 1 tablet (0.5 mg total) by mouth  daily as needed for anxiety. 20 tablet 0   azithromycin  (ZITHROMAX ) 250 MG tablet 2 tab po x 1 day then 1 tab po daily 6 tablet 0   celecoxib  (CELEBREX ) 100 MG capsule Take 1 capsule (100 mg total) by mouth 2 (two) times daily. 60 capsule 0   diazepam  (VALIUM ) 2 MG tablet Take 1 tablet (2 mg total) by mouth once as needed for up to 2 doses for anxiety (take 30-45 minutes prior to MRI. OK to repeat x 1). (Patient not taking: Reported on 02/29/2024) 30 tablet 0   HYDROcodone -acetaminophen  (NORCO/VICODIN) 5-325 MG tablet Take 1 tablet by mouth every 12 (twelve) hours as needed for moderate pain (pain score 4-6).  (Patient not taking: Reported on 02/29/2024) 60 tablet 0   predniSONE  (DELTASONE ) 10 MG tablet 3 tabs by mouth daily x 3 days, then 2 tabs by mouth daily x 2 days then 1 tab by mouth daily x 2 days 15 tablet 0   No facility-administered medications prior to visit.     Per HPI unless specifically indicated in ROS section below Review of Systems  Objective:  BP 120/82   Pulse 70   Temp 98.9 F (37.2 C) (Oral)   Ht 5' 5 (1.651 m)   Wt 181 lb 4 oz (82.2 kg)   SpO2 100%   BMI 30.16 kg/m   Wt Readings from Last 3 Encounters:  02/29/24 181 lb 4 oz (82.2 kg)  02/17/24 170 lb (77.1 kg)  01/05/24 170 lb (77.1 kg)      Physical Exam Vitals and nursing note reviewed.  Constitutional:      Appearance: Normal appearance. She is not ill-appearing.  HENT:     Head: Normocephalic and atraumatic.     Mouth/Throat:     Mouth: Mucous membranes are moist.     Pharynx: Oropharynx is clear. No oropharyngeal exudate or posterior oropharyngeal erythema.  Eyes:     Extraocular Movements: Extraocular movements intact.     Conjunctiva/sclera: Conjunctivae normal.     Pupils: Pupils are equal, round, and reactive to light.  Neck:     Thyroid : No thyroid  mass, thyromegaly or thyroid  tenderness.  Cardiovascular:     Rate and Rhythm: Normal rate and regular rhythm.     Pulses: Normal pulses.     Heart sounds: Normal heart sounds. No murmur heard. Pulmonary:     Effort: Pulmonary effort is normal. No respiratory distress.     Breath sounds: Normal breath sounds. No wheezing, rhonchi or rales.  Abdominal:     General: Bowel sounds are normal. There is no distension.     Palpations: Abdomen is soft. There is no mass.     Tenderness: There is no abdominal tenderness. There is no guarding or rebound.     Hernia: No hernia is present.  Musculoskeletal:     Cervical back: Normal range of motion and neck supple.     Right lower leg: No edema.     Left lower leg: No edema.  Skin:    General: Skin is  warm and dry.     Findings: No rash.  Neurological:     Mental Status: She is alert.  Psychiatric:        Mood and Affect: Mood normal.        Behavior: Behavior normal.       Results for orders placed or performed during the hospital encounter of 12/01/23  CBC with Differential   Collection Time: 12/01/23 12:17 PM  Result  Value Ref Range   WBC 5.9 4.0 - 10.5 K/uL   RBC 4.29 3.87 - 5.11 MIL/uL   Hemoglobin 13.3 12.0 - 15.0 g/dL   HCT 60.7 63.9 - 53.9 %   MCV 91.4 80.0 - 100.0 fL   MCH 31.0 26.0 - 34.0 pg   MCHC 33.9 30.0 - 36.0 g/dL   RDW 87.1 88.4 - 84.4 %   Platelets 250 150 - 400 K/uL   nRBC 0.0 0.0 - 0.2 %   Neutrophils Relative % 59 %   Neutro Abs 3.5 1.7 - 7.7 K/uL   Lymphocytes Relative 31 %   Lymphs Abs 1.8 0.7 - 4.0 K/uL   Monocytes Relative 7 %   Monocytes Absolute 0.4 0.1 - 1.0 K/uL   Eosinophils Relative 2 %   Eosinophils Absolute 0.1 0.0 - 0.5 K/uL   Basophils Relative 1 %   Basophils Absolute 0.0 0.0 - 0.1 K/uL   Immature Granulocytes 0 %   Abs Immature Granulocytes 0.01 0.00 - 0.07 K/uL  Comprehensive metabolic panel   Collection Time: 12/01/23 12:17 PM  Result Value Ref Range   Sodium 139 135 - 145 mmol/L   Potassium 3.9 3.5 - 5.1 mmol/L   Chloride 105 98 - 111 mmol/L   CO2 25 22 - 32 mmol/L   Glucose, Bld 103 (H) 70 - 99 mg/dL   BUN 18 6 - 20 mg/dL   Creatinine, Ser 9.20 0.44 - 1.00 mg/dL   Calcium 9.5 8.9 - 89.6 mg/dL   Total Protein 7.3 6.5 - 8.1 g/dL   Albumin 4.4 3.5 - 5.0 g/dL   AST 36 15 - 41 U/L   ALT 41 0 - 44 U/L   Alkaline Phosphatase 44 38 - 126 U/L   Total Bilirubin 0.7 0.0 - 1.2 mg/dL   GFR, Estimated >39 >39 mL/min   Anion gap 9 5 - 15  Troponin I (High Sensitivity)   Collection Time: 12/01/23 12:17 PM  Result Value Ref Range   Troponin I (High Sensitivity) <2 <18 ng/L  Troponin I (High Sensitivity)   Collection Time: 12/01/23  2:12 PM  Result Value Ref Range   Troponin I (High Sensitivity) <2 <18 ng/L   Lab Results   Component Value Date   TSH 1.13 10/16/2022    Lab Results  Component Value Date   VITAMINB12 485 10/16/2022    Assessment & Plan:   Problem List Items Addressed This Visit     Fatigue - Primary   1 month history of increased daytime fatigue, somnolence.  She states she previously tested negative for OSA ~2010, no records available.  Low ESS = 3 Encouraged regular exercise routine to include both cardio and strength training.  Further evaluate with labs r/o reversible causes of fatigue.  Will be in touch with results. Pt agrees with plan.       Relevant Orders   TSH   CBC with Differential/Platelet   Basic metabolic panel with GFR   Vitamin B12   VITAMIN D  25 Hydroxy (Vit-D Deficiency, Fractures)   Hepatic function panel   IBC panel     Meds ordered this encounter  Medications   ALPRAZolam  (XANAX ) 0.5 MG tablet    Sig: Take 1 tablet (0.5 mg total) by mouth daily as needed for anxiety.    Dispense:  20 tablet    Refill:  0    Orders Placed This Encounter  Procedures   TSH   CBC with Differential/Platelet   Basic metabolic panel  with GFR   Vitamin B12   VITAMIN D  25 Hydroxy (Vit-D Deficiency, Fractures)   Hepatic function panel   IBC panel    Patient Instructions  Labs today to check for reversible causes of fatigue  Sleep questionnaire today  Incorporate regular exercise -both cardio and strength training - into your routine.   Follow up plan: Return if symptoms worsen or fail to improve.  Anton Blas, MD

## 2024-02-29 NOTE — Assessment & Plan Note (Addendum)
 1 month history of increased daytime fatigue, somnolence.  She states she previously tested negative for OSA ~2010, no records available.  Low ESS = 3 Encouraged regular exercise routine to include both cardio and strength training.  Further evaluate with labs r/o reversible causes of fatigue.  Will be in touch with results. Pt agrees with plan.

## 2024-03-01 LAB — CBC WITH DIFFERENTIAL/PLATELET
Basophils Absolute: 0.1 K/uL (ref 0.0–0.1)
Basophils Relative: 0.9 % (ref 0.0–3.0)
Eosinophils Absolute: 0.1 K/uL (ref 0.0–0.7)
Eosinophils Relative: 2.1 % (ref 0.0–5.0)
HCT: 40.2 % (ref 36.0–46.0)
Hemoglobin: 13.5 g/dL (ref 12.0–15.0)
Lymphocytes Relative: 32.2 % (ref 12.0–46.0)
Lymphs Abs: 2 K/uL (ref 0.7–4.0)
MCHC: 33.7 g/dL (ref 30.0–36.0)
MCV: 92.8 fl (ref 78.0–100.0)
Monocytes Absolute: 0.3 K/uL (ref 0.1–1.0)
Monocytes Relative: 4.8 % (ref 3.0–12.0)
Neutro Abs: 3.7 K/uL (ref 1.4–7.7)
Neutrophils Relative %: 60 % (ref 43.0–77.0)
Platelets: 257 K/uL (ref 150.0–400.0)
RBC: 4.34 Mil/uL (ref 3.87–5.11)
RDW: 13.8 % (ref 11.5–15.5)
WBC: 6.1 K/uL (ref 4.0–10.5)

## 2024-03-01 LAB — HEPATIC FUNCTION PANEL
ALT: 55 U/L — ABNORMAL HIGH (ref 0–35)
AST: 37 U/L (ref 0–37)
Albumin: 4.8 g/dL (ref 3.5–5.2)
Alkaline Phosphatase: 50 U/L (ref 39–117)
Bilirubin, Direct: 0.1 mg/dL (ref 0.0–0.3)
Total Bilirubin: 0.4 mg/dL (ref 0.2–1.2)
Total Protein: 7.2 g/dL (ref 6.0–8.3)

## 2024-03-01 LAB — VITAMIN B12: Vitamin B-12: 266 pg/mL (ref 211–911)

## 2024-03-01 LAB — BASIC METABOLIC PANEL WITH GFR
BUN: 13 mg/dL (ref 6–23)
CO2: 24 meq/L (ref 19–32)
Calcium: 9.6 mg/dL (ref 8.4–10.5)
Chloride: 102 meq/L (ref 96–112)
Creatinine, Ser: 0.65 mg/dL (ref 0.40–1.20)
GFR: 98.48 mL/min (ref >60.00)
Glucose, Bld: 80 mg/dL (ref 70–99)
Potassium: 3.9 meq/L (ref 3.5–5.1)
Sodium: 137 meq/L (ref 135–145)

## 2024-03-01 LAB — IBC PANEL
Iron: 97 ug/dL (ref 42–145)
Saturation Ratios: 22.4 % (ref 20.0–50.0)
TIBC: 432.6 ug/dL (ref 250.0–450.0)
Transferrin: 309 mg/dL (ref 212.0–360.0)

## 2024-03-01 LAB — VITAMIN D 25 HYDROXY (VIT D DEFICIENCY, FRACTURES): VITD: 40.93 ng/mL (ref 30.00–100.00)

## 2024-03-01 LAB — TSH: TSH: 0.98 u[IU]/mL (ref 0.35–5.50)

## 2024-03-02 ENCOUNTER — Ambulatory Visit: Payer: Self-pay | Admitting: Family Medicine

## 2024-03-02 MED ORDER — VITAMIN B-12 1000 MCG PO TABS
1000.0000 ug | ORAL_TABLET | Freq: Every day | ORAL | Status: DC
Start: 2024-03-02 — End: 2024-06-14

## 2024-04-18 ENCOUNTER — Encounter: Payer: Self-pay | Admitting: Family Medicine

## 2024-04-18 ENCOUNTER — Encounter: Payer: Self-pay | Admitting: *Deleted

## 2024-04-18 ENCOUNTER — Ambulatory Visit: Admitting: Family Medicine

## 2024-04-18 VITALS — BP 120/70 | HR 73 | Temp 97.7°F | Resp 20 | Ht 65.0 in | Wt 180.4 lb

## 2024-04-18 DIAGNOSIS — M7918 Myalgia, other site: Secondary | ICD-10-CM | POA: Diagnosis not present

## 2024-04-18 DIAGNOSIS — R350 Frequency of micturition: Secondary | ICD-10-CM | POA: Insufficient documentation

## 2024-04-18 DIAGNOSIS — T2121XD Burn of second degree of chest wall, subsequent encounter: Secondary | ICD-10-CM | POA: Diagnosis not present

## 2024-04-18 DIAGNOSIS — T2121XA Burn of second degree of chest wall, initial encounter: Secondary | ICD-10-CM | POA: Insufficient documentation

## 2024-04-18 LAB — POC URINALSYSI DIPSTICK (AUTOMATED)
Bilirubin, UA: NEGATIVE
Blood, UA: NEGATIVE
Glucose, UA: NEGATIVE
Ketones, UA: NEGATIVE
Leukocytes, UA: NEGATIVE
Nitrite, UA: NEGATIVE
Protein, UA: NEGATIVE
Spec Grav, UA: >=1.03 — AB (ref 1.010–1.025)
Urobilinogen, UA: 0.2 U/dL
pH, UA: 5.5 (ref 5.0–8.0)

## 2024-04-18 MED ORDER — ESTRADIOL 0.05 MG/24HR TD PTTW: MEDICATED_PATCH | TRANSDERMAL | 0 refills | Status: DC

## 2024-04-18 MED ORDER — HYDROCODONE-ACETAMINOPHEN 5-325 MG PO TABS: 1.0000 | ORAL_TABLET | Freq: Four times a day (QID) | ORAL | 0 refills | Status: DC | PRN

## 2024-04-18 MED ORDER — CEPHALEXIN 500 MG PO CAPS: 500.0000 mg | ORAL_CAPSULE | Freq: Three times a day (TID) | ORAL | 0 refills | Status: DC

## 2024-04-18 MED ORDER — LIDOCAINE 5 % EX OINT: 1.0000 | TOPICAL_OINTMENT | CUTANEOUS | 0 refills | Status: DC | PRN
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
## 2024-04-26 ENCOUNTER — Ambulatory Visit (INDEPENDENT_AMBULATORY_CARE_PROVIDER_SITE_OTHER): Admitting: Family Medicine

## 2024-04-26 VITALS — BP 132/82 | HR 77 | Temp 98.0°F | Ht 66.0 in | Wt 177.4 lb

## 2024-04-26 DIAGNOSIS — Z13220 Encounter for screening for lipoid disorders: Secondary | ICD-10-CM | POA: Diagnosis not present

## 2024-04-26 DIAGNOSIS — Z87891 Personal history of nicotine dependence: Secondary | ICD-10-CM | POA: Diagnosis not present

## 2024-04-26 DIAGNOSIS — Z131 Encounter for screening for diabetes mellitus: Secondary | ICD-10-CM | POA: Diagnosis not present

## 2024-04-26 DIAGNOSIS — Z0000 Encounter for general adult medical examination without abnormal findings: Secondary | ICD-10-CM

## 2024-04-26 DIAGNOSIS — Z1211 Encounter for screening for malignant neoplasm of colon: Secondary | ICD-10-CM

## 2024-04-26 DIAGNOSIS — E782 Mixed hyperlipidemia: Secondary | ICD-10-CM

## 2024-04-26 DIAGNOSIS — Z1159 Encounter for screening for other viral diseases: Secondary | ICD-10-CM

## 2024-04-26 LAB — HEPATIC FUNCTION PANEL
ALT: 62 U/L — ABNORMAL HIGH (ref 0–35)
AST: 37 U/L (ref 0–37)
Albumin: 4.9 g/dL (ref 3.5–5.2)
Alkaline Phosphatase: 43 U/L (ref 39–117)
Bilirubin, Direct: 0.1 mg/dL (ref 0.0–0.3)
Total Bilirubin: 0.5 mg/dL (ref 0.2–1.2)
Total Protein: 7.4 g/dL (ref 6.0–8.3)

## 2024-04-26 LAB — LIPID PANEL
Cholesterol: 243 mg/dL — ABNORMAL HIGH (ref 0–200)
HDL: 57.8 mg/dL (ref >39.00)
LDL Cholesterol: 154 mg/dL — ABNORMAL HIGH (ref 0–99)
NonHDL: 185.27
Total CHOL/HDL Ratio: 4
Triglycerides: 158 mg/dL — ABNORMAL HIGH (ref 0.0–149.0)
VLDL: 31.6 mg/dL (ref 0.0–40.0)

## 2024-04-26 LAB — HEMOGLOBIN A1C: Hgb A1c MFr Bld: 6 % (ref 4.6–6.5)

## 2024-04-26 MED ORDER — ALPRAZOLAM 0.5 MG PO TABS: 0.5000 mg | ORAL_TABLET | Freq: Every day | ORAL | 0 refills | Status: DC | PRN
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
## 2024-04-26 NOTE — Assessment & Plan Note (Signed)
 Chronic, due for re-eval Above goal in past  Counseled patient to quit smoking and work on low cholesterol diet. Continue omega-3 fatty acids and consider red yeast rice.

## 2024-04-26 NOTE — Assessment & Plan Note (Signed)
 Smoking cessation instruction/counseling given:  counseled patient on the dangers of tobacco use, advised patient to stop smoking, and reviewed strategies to maximize success

## 2024-04-27 LAB — HEPATITIS C ANTIBODY: Hepatitis C Ab: NONREACTIVE

## 2024-05-02 ENCOUNTER — Ambulatory Visit: Payer: Self-pay | Admitting: Family Medicine

## 2024-06-14 ENCOUNTER — Ambulatory Visit: Admitting: Nurse Practitioner

## 2024-06-14 ENCOUNTER — Encounter: Payer: Self-pay | Admitting: Nurse Practitioner

## 2024-06-14 VITALS — BP 144/76 | HR 75 | Temp 97.9°F | Ht 65.5 in | Wt 174.6 lb

## 2024-06-14 DIAGNOSIS — J208 Acute bronchitis due to other specified organisms: Secondary | ICD-10-CM

## 2024-06-14 DIAGNOSIS — J069 Acute upper respiratory infection, unspecified: Secondary | ICD-10-CM

## 2024-06-14 LAB — POCT INFLUENZA A/B
Influenza A, POC: NEGATIVE
Influenza B, POC: NEGATIVE

## 2024-06-14 MED ORDER — ALBUTEROL SULFATE HFA 108 (90 BASE) MCG/ACT IN AERS: 1.0000 | INHALATION_SPRAY | Freq: Four times a day (QID) | RESPIRATORY_TRACT | 0 refills | Status: AC | PRN

## 2024-06-14 MED ORDER — HYDROCODONE BIT-HOMATROP MBR 5-1.5 MG/5ML PO SOLN
5.0000 mL | Freq: Three times a day (TID) | ORAL | 0 refills | Status: DC | PRN
Start: 2024-06-14 — End: 2024-06-22

## 2024-06-14 MED ORDER — DM-GUAIFENESIN ER 30-600 MG PO TB12: 1.0000 | ORAL_TABLET | Freq: Two times a day (BID) | ORAL | Status: DC | PRN

## 2024-06-14 MED ORDER — CETIRIZINE HCL 10 MG PO TABS: 10.0000 mg | ORAL_TABLET | Freq: Every day | ORAL | Status: DC

## 2024-06-14 NOTE — Patient Instructions (Signed)
 URI Instructions: Use mucinex  DM or Robitussin  or delsym for cough without sinus congestion  You can use plain Tylenol  or Advil  for fever, chills and achyness. Use cool mist humidifier at bedtime to help with nasal congestion and cough.  Cold/cough medications may have tylenol  or ibuprofen  or guaifenesin  or dextromethophan in them, so be careful not to take beyond the recommended dose for each of these medications.   Common cold symptoms are usually triggered by a virus.  The antibiotics are usually not necessary. On average, a viral cold illness may take 7-10 days to resolve. Please, make an appointment if you are not better or if you're worse.

## 2024-06-14 NOTE — Progress Notes (Signed)
 Acute Office Visit  Subjective:    Patient ID: Rebecca Allen, female    DOB: 10-09-1965, 58 y.o.   MRN: [id number]  Chief Complaint  Patient presents with   Cough    Started 2-3 days ago, Cough-worse at night, chest congestion, fever, headache and left ear pain, fatigue, OTC medication is not helping with cough-Delsym, took Tylenol  for fever  Home COVID test was negative     URI  This is a new problem. The current episode started in the past 7 days. The problem has been unchanged. The maximum temperature recorded prior to her arrival was 102 - 102.9 F. Associated symptoms include congestion, coughing, headaches, joint pain, rhinorrhea and sinus pain. Pertinent negatives include no abdominal pain, chest pain, diarrhea, dysuria, ear pain, joint swelling, nausea, neck pain, plugged ear sensation, rash, sneezing, sore throat, swollen glands, vomiting or wheezing. She has tried acetaminophen  (and delsym) for the symptoms. The treatment provided mild relief.   Outpatient Medications Prior to Visit  Medication Sig   ALPRAZolam  (XANAX ) 0.5 MG tablet Take 1 tablet (0.5 mg total) by mouth daily as needed for anxiety.   calcium carbonate (TUMS - DOSED IN MG ELEMENTAL CALCIUM) 500 MG chewable tablet Chew 6 tablets by mouth daily as needed for indigestion or heartburn.   clobetasol ointment (TEMOVATE) 0.05 % Apply topically 2 (two) times daily as needed.   estradiol  (VIVELLE -DOT) 0.05 MG/24HR patch PLACE 1 PATCH TRANS-DERMALLY TWICE A WEEK. REMOVE OLD PATCH BEFORE APPLYING NEW PATCH.   ondansetron  (ZOFRAN ) 4 MG tablet Take 1 tablet (4 mg total) by mouth every 8 (eight) hours as needed for nausea or vomiting.   pantoprazole  (PROTONIX ) 40 MG tablet Take 1 tablet (40 mg total) by mouth daily.   [DISCONTINUED] cephALEXin  (KEFLEX ) 500 MG capsule Take 1 capsule (500 mg total) by mouth 3 (three) times daily. (Patient not taking: Reported on 06/14/2024)   [DISCONTINUED] cyanocobalamin  (VITAMIN B12) 1000  MCG tablet Take 1 tablet (1,000 mcg total) by mouth daily. (Patient not taking: Reported on 06/14/2024)   [DISCONTINUED] HYDROcodone -acetaminophen  (NORCO/VICODIN) 5-325 MG tablet Take 1 tablet by mouth every 6 (six) hours as needed for moderate pain (pain score 4-6) or severe pain (pain score 7-10). (Patient not taking: Reported on 06/14/2024)   [DISCONTINUED] lidocaine  (XYLOCAINE ) 5 % ointment Apply 1 Application topically as needed. (Patient not taking: Reported on 06/14/2024)   [DISCONTINUED] Lidocaine -Adhesive Sheets (LIDOPURE PATCH ) 5 % KIT Apply topically. (Patient not taking: Reported on 06/14/2024)   [DISCONTINUED] tizanidine  (ZANAFLEX ) 2 MG capsule Take 1 capsule (2 mg total) by mouth 3 (three) times daily as needed for muscle spasms. (Patient not taking: Reported on 06/14/2024)   No facility-administered medications prior to visit.    Reviewed past medical and social history.  Review of Systems  HENT:  Positive for congestion, rhinorrhea and sinus pain. Negative for ear pain, sneezing and sore throat.   Respiratory:  Positive for cough. Negative for wheezing.   Cardiovascular:  Negative for chest pain.  Gastrointestinal:  Negative for abdominal pain, diarrhea, nausea and vomiting.  Genitourinary:  Negative for dysuria.  Musculoskeletal:  Positive for joint pain. Negative for neck pain.  Skin:  Negative for rash.  Neurological:  Positive for headaches.   Per HPI     Objective:    Physical Exam Vitals reviewed.  Constitutional:      General: She is not in acute distress. HENT:     Right Ear: Tympanic membrane, ear canal and external ear normal.  Left Ear: Tympanic membrane, ear canal and external ear normal.     Nose: Congestion present. No nasal tenderness, mucosal edema or rhinorrhea.     Right Nostril: No occlusion.     Left Nostril: No occlusion.     Right Turbinates: Not enlarged, swollen or pale.     Left Turbinates: Not enlarged, swollen or pale.     Right Sinus: No  maxillary sinus tenderness or frontal sinus tenderness.     Left Sinus: No maxillary sinus tenderness or frontal sinus tenderness.     Mouth/Throat:     Pharynx: Uvula midline.     Tonsils: No tonsillar exudate or tonsillar abscesses.  Eyes:     Extraocular Movements: Extraocular movements intact.     Conjunctiva/sclera: Conjunctivae normal.  Cardiovascular:     Rate and Rhythm: Normal rate and regular rhythm.     Pulses: Normal pulses.     Heart sounds: Normal heart sounds.  Pulmonary:     Effort: Pulmonary effort is normal.     Breath sounds: Normal breath sounds.  Musculoskeletal:     Cervical back: Normal range of motion and neck supple.  Lymphadenopathy:     Cervical: No cervical adenopathy.  Neurological:     Mental Status: She is alert and oriented to person, place, and time.    BP (!) 144/76 (BP Location: Left Arm, Patient Position: Sitting)   Pulse 75   Temp 97.9 F (36.6 C) (Temporal)   Ht 5' 5.5 (1.664 m)   Wt 174 lb 9.6 oz (79.2 kg)   SpO2 97%   BMI 28.61 kg/m    Results for orders placed or performed in visit on 06/14/24  POCT Influenza A/B  Result Value Ref Range   Influenza A, POC Negative Negative   Influenza B, POC Negative Negative      Assessment & Plan:   Problem List Items Addressed This Visit   None Visit Diagnoses       Viral upper respiratory tract infection    -  Primary   Relevant Medications   HYDROcodone  bit-homatropine (HYCODAN) 5-1.5 MG/5ML syrup   dextromethorphan-guaiFENesin  (MUCINEX  DM) 30-600 MG 12hr tablet   cetirizine (ZYRTEC) 10 MG tablet   albuterol  (VENTOLIN  HFA) 108 (90 Base) MCG/ACT inhaler   Other Relevant Orders   POCT Influenza A/B (Completed)     Acute bronchitis due to other specified organisms       Relevant Medications   HYDROcodone  bit-homatropine (HYCODAN) 5-1.5 MG/5ML syrup   dextromethorphan-guaiFENesin  (MUCINEX  DM) 30-600 MG 12hr tablet   albuterol  (VENTOLIN  HFA) 108 (90 Base) MCG/ACT inhaler       Meds ordered this encounter  Medications   HYDROcodone  bit-homatropine (HYCODAN) 5-1.5 MG/5ML syrup    Sig: Take 5 mLs by mouth every 8 (eight) hours as needed for cough.    Dispense:  120 mL    Refill:  0    Supervising Provider:   BERNETA FALLOW ALFRED [[id number]]   dextromethorphan-guaiFENesin  (MUCINEX  DM) 30-600 MG 12hr tablet    Sig: Take 1 tablet by mouth 2 (two) times daily as needed for cough.    Supervising Provider:   BERNETA FALLOW ALFRED [[id number]]   cetirizine (ZYRTEC) 10 MG tablet    Sig: Take 1 tablet (10 mg total) by mouth daily.    Supervising Provider:   BERNETA FALLOW ALFRED [[id number]]   albuterol  (VENTOLIN  HFA) 108 (90 Base) MCG/ACT inhaler    Sig: Inhale 1-2 puffs into the lungs every 6 (six) hours as needed.  Dispense:  8 g    Refill:  0    Supervising Provider:   BERNETA FALLOW ALFRED [[id number]]   Stop vaping. Use mucinex  DM or Robitussin  or delsym for cough without sinus congestion  You can use plain Tylenol  or Advil  for fever, chills and achyness. Use cool mist humidifier at bedtime to help with nasal congestion and cough.  Cold/cough medications may have tylenol  or ibuprofen  or guaifenesin  or dextromethophan in them, so be careful not to take beyond the recommended dose for each of these medications.   Common cold symptoms are usually triggered by a virus.  The antibiotics are usually not necessary. On average, a viral cold illness may take 7-10 days to resolve. Please, make an appointment if you are not better or if you're worse.   No follow-ups on file.    Roselie Mood, NP

## 2024-06-22 ENCOUNTER — Telehealth: Payer: Self-pay | Admitting: Family Medicine

## 2024-06-22 DIAGNOSIS — Z0279 Encounter for issue of other medical certificate: Secondary | ICD-10-CM

## 2024-06-22 NOTE — Telephone Encounter (Signed)
 Type of forms received: driver medicatiom form    Routed un:azidnoz pool   Paperwork received by : randall     Individual made aware of 3-5 business day turn around (Y/N):y   Form completed and patient made aware of charges(Y/N):y     Faxed to : [phone]   Form location:  in provider's folder upfront

## 2024-06-22 NOTE — Telephone Encounter (Signed)
 Completed form faxed to [phone] as requested.

## 2024-06-22 NOTE — Telephone Encounter (Signed)
 Spoke with Rebecca Allen and updated her current medication list.  She states this form is due to her taking alprazolam  as needed.  Form placed in Dr. Sherrel office in box to complete.

## 2024-07-07 ENCOUNTER — Other Ambulatory Visit: Payer: Self-pay | Admitting: Family Medicine

## 2024-07-09 ENCOUNTER — Other Ambulatory Visit: Payer: Self-pay | Admitting: Family Medicine

## 2024-07-10 NOTE — Telephone Encounter (Signed)
 Last office visit 06/14/24 with Roselie Mood, NP for URI.  Last refilled 04/26/2024 for #20 with no refills.  Next appt: No future appointments.

## 2024-07-12 ENCOUNTER — Telehealth: Admitting: Family

## 2024-07-12 DIAGNOSIS — R051 Acute cough: Secondary | ICD-10-CM | POA: Diagnosis not present

## 2024-07-12 DIAGNOSIS — J22 Unspecified acute lower respiratory infection: Secondary | ICD-10-CM | POA: Diagnosis not present

## 2024-07-12 DIAGNOSIS — B9689 Other specified bacterial agents as the cause of diseases classified elsewhere: Secondary | ICD-10-CM | POA: Diagnosis not present

## 2024-07-12 MED ORDER — AMOXICILLIN-POT CLAVULANATE 875-125 MG PO TABS: 1.0000 | ORAL_TABLET | Freq: Two times a day (BID) | ORAL | 0 refills | Status: DC

## 2024-07-12 MED ORDER — HYDROCODONE BIT-HOMATROP MBR 5-1.5 MG/5ML PO SOLN: 5.0000 mL | Freq: Three times a day (TID) | ORAL | 0 refills | Status: AC | PRN

## 2024-07-12 MED ORDER — PREDNISONE 20 MG PO TABS: ORAL_TABLET | ORAL | 0 refills | Status: DC

## 2024-07-12 NOTE — Assessment & Plan Note (Signed)
 rx augmentin  875/125 mg po bid x 10 days Rx hydrocodone  homatrop cough syrup precautions advised.  Can utilize mucinex  prn  Rx prednisone  20 mg 2 tabs once daily x 5 days.   Take antibiotic as prescribed. Increase oral fluids. Pt to f/u if sx worsen and or fail to improve in 2-3 days.

## 2024-07-12 NOTE — Progress Notes (Signed)
 Virtual Visit via Video note  I connected with Rebecca Allen on 07/12/24 at home by video and verified that I am speaking with the correct person using two identifiers.The provider, Ginger Patrick, FNP is located in their office at time of visit.  I discussed the limitations, risks, security and privacy concerns of performing an evaluation and management service by video and the availability of in person appointments. I also discussed with the patient that there may be a patient responsible charge related to this service. The patient expressed understanding and agreed to proceed.  Subjective: PCP: Avelina Greig BRAVO, MD  Chief Complaint  Patient presents with   Acute Visit    Reports cough and congestion x2 weeks. Denies fever, chills, body aches.    HPI  Over the last two weeks with nasal congestion, and cough with some chest congestion. She does note she wheezes from time to time 'kind of' but when she coughs she coughs so much she can't catch her breath. She did lose her voice which is slowly coming back. No fever but does have cold sweats/chills. No sore throat but some left ear pain which started yesterday.   Drinking lots of tea and taking some otc delsym with some mild relief.       ROS: Per HPI  Current Outpatient Medications:    albuterol  (VENTOLIN  HFA) 108 (90 Base) MCG/ACT inhaler, Inhale 1-2 puffs into the lungs every 6 (six) hours as needed., Disp: 8 g, Rfl: 0   ALPRAZolam  (XANAX ) 0.5 MG tablet, TAKE 1 TABLET BY MOUTH EVERY DAY AS NEEDED FOR ANXIETY, Disp: 20 tablet, Rfl: 0   amoxicillin -clavulanate (AUGMENTIN ) 875-125 MG tablet, Take 1 tablet by mouth 2 (two) times daily., Disp: 20 tablet, Rfl: 0   calcium carbonate (TUMS - DOSED IN MG ELEMENTAL CALCIUM) 500 MG chewable tablet, Chew 6 tablets by mouth daily as needed for indigestion or heartburn., Disp: , Rfl:    clobetasol ointment (TEMOVATE) 0.05 %, Apply topically 2 (two) times daily as needed., Disp: , Rfl:     estradiol  (VIVELLE -DOT) 0.05 MG/24HR patch, PLACE 1 PATCH TRANS-DERMALLY TWICE A WEEK. REMOVE OLD PATCH BEFORE APPLYING NEW PATCH., Disp: 24 patch, Rfl: 3   HYDROcodone  bit-homatropine (HYCODAN) 5-1.5 MG/5ML syrup, Take 5 mLs by mouth every 8 (eight) hours as needed for cough., Disp: 120 mL, Rfl: 0   loratadine (CLARITIN) 10 MG tablet, Take 10 mg by mouth daily as needed for allergies., Disp: , Rfl:    pantoprazole  (PROTONIX ) 40 MG tablet, Take 1 tablet (40 mg total) by mouth daily., Disp: 90 tablet, Rfl: 1   predniSONE  (DELTASONE ) 20 MG tablet, Take two tablets once daily for five days, Disp: 10 tablet, Rfl: 0  Observations/Objective: Physical Exam Vitals reviewed.  Constitutional:      General: She is not in acute distress.    Appearance: Normal appearance. She is not ill-appearing.  Cardiovascular:     Rate and Rhythm: Normal rate.  Pulmonary:     Effort: Pulmonary effort is normal.  Neurological:     General: No focal deficit present.     Mental Status: She is alert and oriented to person, place, and time.  Psychiatric:        Mood and Affect: Mood normal.        Behavior: Behavior normal.        Thought Content: Thought content normal.     Assessment and Plan: Acute cough -     HYDROcodone  Bit-Homatrop MBr; Take 5 mLs  by mouth every 8 (eight) hours as needed for cough.  Dispense: 120 mL; Refill: 0 -     predniSONE ; Take two tablets once daily for five days  Dispense: 10 tablet; Refill: 0  Bacterial lower respiratory infection Assessment & Plan: rx augmentin  875/125 mg po bid x 10 days Rx hydrocodone  homatrop cough syrup precautions advised.  Can utilize mucinex  prn  Rx prednisone  20 mg 2 tabs once daily x 5 days.   Take antibiotic as prescribed. Increase oral fluids. Pt to f/u if sx worsen and or fail to improve in 2-3 days.   Orders: -     Amoxicillin -Pot Clavulanate; Take 1 tablet by mouth 2 (two) times daily.  Dispense: 20 tablet; Refill: 0 -     predniSONE ; Take  two tablets once daily for five days  Dispense: 10 tablet; Refill: 0    Follow Up Instructions: Return for f/u PCP if no improvement in symptoms.   I discussed the assessment and treatment plan with the patient. The patient was provided an opportunity to ask questions and all were answered. The patient agreed with the plan and demonstrated an understanding of the instructions.   The patient was advised to call back or seek an in-person evaluation if the symptoms worsen or if the condition fails to improve as anticipated.  The above assessment and management plan was discussed with the patient. The patient verbalized understanding of and has agreed to the management plan. Patient is aware to call the clinic if symptoms persist or worsen. Patient is aware when to return to the clinic for a follow-up visit. Patient educated on when it is appropriate to go to the emergency department.     Ginger Patrick, MSN, APRN, FNP-C [facility]

## 2024-07-17 ENCOUNTER — Other Ambulatory Visit: Payer: Self-pay | Admitting: Family Medicine

## 2024-07-17 DIAGNOSIS — Z1231 Encounter for screening mammogram for malignant neoplasm of breast: Secondary | ICD-10-CM

## 2024-07-28 ENCOUNTER — Other Ambulatory Visit: Payer: Self-pay | Admitting: Family Medicine

## 2024-07-30 ENCOUNTER — Other Ambulatory Visit: Payer: Self-pay

## 2024-07-30 ENCOUNTER — Emergency Department
Admission: EM | Admit: 2024-07-30 | Discharge: 2024-07-30 | Disposition: A | Discharge status: Home / Self Care | Attending: Emergency Medicine | Admitting: Emergency Medicine

## 2024-07-30 ENCOUNTER — Encounter: Payer: Self-pay | Admitting: Emergency Medicine

## 2024-07-30 ENCOUNTER — Emergency Department

## 2024-07-30 DIAGNOSIS — R0789 Other chest pain: Secondary | ICD-10-CM | POA: Diagnosis present

## 2024-07-30 DIAGNOSIS — Z5321 Procedure and treatment not carried out due to patient leaving prior to being seen by health care provider: Secondary | ICD-10-CM | POA: Diagnosis not present

## 2024-07-30 LAB — BASIC METABOLIC PANEL WITH GFR
Anion gap: 12 (ref 5–15)
BUN: 18 mg/dL (ref 6–20)
CO2: 27 mmol/L (ref 22–32)
Calcium: 10.1 mg/dL (ref 8.9–10.3)
Chloride: 100 mmol/L (ref 98–111)
Creatinine, Ser: 0.77 mg/dL (ref 0.44–1.00)
GFR, Estimated: >60 mL/min
Glucose, Bld: 108 mg/dL — ABNORMAL HIGH (ref 70–99)
Potassium: 3.9 mmol/L (ref 3.5–5.1)
Sodium: 139 mmol/L (ref 135–145)

## 2024-07-30 LAB — TROPONIN T, HIGH SENSITIVITY: Troponin T High Sensitivity: <15 ng/L (ref 0–19)

## 2024-07-30 LAB — CBC
HCT: 39.9 % (ref 36.0–46.0)
Hemoglobin: 13.5 g/dL (ref 12.0–15.0)
MCH: 31.2 pg (ref 26.0–34.0)
MCHC: 33.8 g/dL (ref 30.0–36.0)
MCV: 92.1 fL (ref 80.0–100.0)
Platelets: 266 K/uL (ref 150–400)
RBC: 4.33 MIL/uL (ref 3.87–5.11)
RDW: 12.6 % (ref 11.5–15.5)
WBC: 8 K/uL (ref 4.0–10.5)
nRBC: 0 % (ref 0.0–0.2)

## 2024-07-30 NOTE — ED Triage Notes (Signed)
 Pt c/o centralized squeezing stabbing pain in the center of her chest that radiates into the back and jaw ongoing for the past few hours. Took 324 ASA and tums without relief. No known cardiac hx

## 2024-08-16 ENCOUNTER — Ambulatory Visit: Payer: Self-pay

## 2024-08-16 ENCOUNTER — Ambulatory Visit
Admission: RE | Admit: 2024-08-16 | Discharge: 2024-08-16 | Disposition: A | Discharge status: Home / Self Care | Source: Ambulatory Visit | Attending: Family Medicine | Admitting: Family Medicine

## 2024-08-16 DIAGNOSIS — Z1231 Encounter for screening mammogram for malignant neoplasm of breast: Secondary | ICD-10-CM | POA: Diagnosis present

## 2024-08-16 NOTE — Telephone Encounter (Signed)
 FYI Only or Action Required?: FYI only for provider: appointment scheduled on 08/18/24, no sooner appts available.  Patient was last seen in primary care on 07/12/2024 by Corwin Antu, FNP.  Called Nurse Triage reporting Fatigue and Shaking.  Symptoms began about a month ago.  Interventions attempted: Rest, hydration, or home remedies.  Symptoms are: unchanged.  Triage Disposition: See Within 3 Days in Office (overriding See Physician Within 24 Hours)  Patient/caregiver understands and will follow disposition?: Yes  Reason for Disposition  [1] MODERATE weakness (e.g., interferes with work, school, normal activities) AND [2] persists > 3 days  Answer Assessment - Initial Assessment Questions Patient states that she has been experiencing fatigue with intermittent shakiness for about a month. She does mention that she recently had the flu and is not sure if this is related. She does report nausea and states that she does occasionally feel fluttering of the heart, last time being on Monday. She is also concerned about redness and twitching of the eyes, but notes that she is not sleeping well. Fatigue is the only symptom present during time of triage. Office visit advised.   1. DESCRIPTION: Describe how you are feeling.     Just very tired and weak  2. SEVERITY: How bad is it?  Can you stand and walk?     Moderate, able to walk around and perform activities  3. ONSET: When did these symptoms begin? (e.g., hours, days, weeks, months)     About a month ago  4. CAUSE: What do you think is causing the weakness or fatigue? (e.g., not drinking enough fluids, medical problem, trouble sleeping)     Unsure  5. NEW MEDICINES:  Have you started on any new medicines recently? (e.g., opioid pain medicines, benzodiazepines, muscle relaxants, antidepressants, antihistamines, neuroleptics, beta blockers)     Only a switch of estrogen patch to different brand  6. OTHER SYMPTOMS: Do you  have any other symptoms? (e.g., chest pain, fever, cough, SOB, vomiting, diarrhea, bleeding, other areas of pain)     Nausea; Denies any other symptoms, but has recently had the flu  7. PREGNANCY: Is there any chance you are pregnant? When was your last menstrual period?     NA  Protocols used: Weakness (Generalized) and Fatigue-A-AH  Copied from CRM #[id number]. Topic: Clinical - Red Word Triage >> Aug 16, 2024 10:56 AM Pinkey ORN wrote: Red Word that prompted transfer to Nurse Triage: Weakness >> Aug 16, 2024 10:58 AM Pinkey ORN wrote: Patient states she's experiencing some weakness + shakes, no mention of being a diabetic. Patient also mentions redness in both eyes, as well as twitching.

## 2024-08-16 NOTE — Telephone Encounter (Signed)
 Next Appt With Family Medicine Darra Ring, MD) 08/18/2024 at 2:40 PM

## 2024-08-17 ENCOUNTER — Ambulatory Visit: Payer: Self-pay | Admitting: Family Medicine

## 2024-08-18 ENCOUNTER — Encounter: Payer: Self-pay | Admitting: Family Medicine

## 2024-08-18 ENCOUNTER — Ambulatory Visit: Admitting: Family Medicine

## 2024-08-18 VITALS — BP 90/60 | HR 88 | Temp 98.0°F | Ht 65.5 in | Wt 180.4 lb

## 2024-08-18 DIAGNOSIS — H04123 Dry eye syndrome of bilateral lacrimal glands: Secondary | ICD-10-CM | POA: Insufficient documentation

## 2024-08-18 DIAGNOSIS — R5383 Other fatigue: Secondary | ICD-10-CM | POA: Diagnosis not present

## 2024-08-18 DIAGNOSIS — E663 Overweight: Secondary | ICD-10-CM | POA: Insufficient documentation

## 2024-08-18 DIAGNOSIS — R251 Tremor, unspecified: Secondary | ICD-10-CM | POA: Insufficient documentation

## 2024-08-18 NOTE — Assessment & Plan Note (Signed)
 Chronic, no improvement with exercise and dietary changes although patient is not currently working on these.  Encouraged her to get back to healthy eating habits, regular exercise such as walking daily.  We have discussed GLP-1 medications in detail.  She will check with her employer to see if weight loss medications are covered.  We discussed benefits and side effects as well as contraindications for GLP-1 medication.  The oral self-pay semaglutide  may be a good option for her given the reduced cost if her employer does not cover GLP-1 medications.

## 2024-08-18 NOTE — Progress Notes (Signed)
 "   Patient ID: Rebecca Allen, female    DOB: 21-Feb-1966, 59 y.o.   MRN: [id number]  This visit was conducted in person.  BP 90/60   Pulse 88   Temp 98 F (36.7 C) (Temporal)   Ht 5' 5.5 (1.664 m)   Wt 180 lb 6 oz (81.8 kg)   SpO2 98%   BMI 29.56 kg/m    CC:  Chief Complaint  Patient presents with   Red Eyes    Eye Dr told her she has dry eyes-Itchy some times   Obesity    Discuss GLP1   Shaky    Subjective:   HPI: Rebecca Allen is a 59 y.o. female presenting on 08/18/2024 for Red Eyes (Eye Dr told her she has dry eyes-Itchy some times), Obesity (Discuss GLP1), and Shaky    New onset  shyness and weakness.  Felt like sugar is abnormal.  No change with eating.  Continue fatigue given poor sleep.  No new meds.  Good water intake and nutrition intake.  No panic or anxiety per pt.  Dealing with her loss  of  child well.   Reviewed recent ER not and labs.   Dry itchy eyes... in past told dry eye. Given dry eye drops.   BMI 29 She is struggling to lose weight.   Exercise none currently given recent flu.     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   Discussed GLP1 meds.. no histor yof thyroifd caner.   Relevant past medical, surgical, family and social history reviewed and updated as indicated. Interim medical history since our last visit reviewed. Allergies and medications reviewed and updated. Outpatient Medications Prior to Visit  Medication Sig Dispense Refill   albuterol  (VENTOLIN  HFA) 108 (90 Base) MCG/ACT inhaler Inhale 1-2 puffs into the lungs every 6 (six) hours as needed. 8 g 0   ALPRAZolam  (XANAX ) 0.5 MG tablet TAKE 1 TABLET BY MOUTH EVERY DAY AS NEEDED FOR ANXIETY 20 tablet 0   calcium carbonate (TUMS - DOSED IN MG ELEMENTAL CALCIUM) 500 MG chewable tablet Chew 6 tablets by mouth daily as needed for indigestion or heartburn.     clobetasol ointment (TEMOVATE) 0.05 % Apply topically 2 (two) times daily as  needed.     estradiol  (VIVELLE -DOT) 0.05 MG/24HR patch PLACE 1 PATCH TRANS-DERMALLY TWICE A WEEK. REMOVE OLD PATCH BEFORE APPLYING NEW PATCH. 24 patch 3   HYDROcodone  bit-homatropine (HYCODAN) 5-1.5 MG/5ML syrup Take 5 mLs by mouth every 8 (eight) hours as needed for cough. 120 mL 0   loratadine (CLARITIN) 10 MG tablet Take 10 mg by mouth daily as needed for allergies.     pantoprazole  (PROTONIX ) 40 MG tablet TAKE 1 TABLET BY MOUTH EVERY DAY 90 tablet 1   amoxicillin -clavulanate (AUGMENTIN ) 875-125 MG tablet Take 1 tablet by mouth 2 (two) times daily. 20 tablet 0   predniSONE  (DELTASONE ) 20 MG tablet Take two tablets once daily for five days 10 tablet 0   No facility-administered medications prior to visit.     Per HPI unless specifically indicated in ROS section below Review of Systems  Constitutional:  Positive for fatigue. Negative for fever.  HENT:  Negative for congestion.   Eyes:  Negative for pain.  Respiratory:  Negative for cough and shortness of breath.   Cardiovascular:  Negative for chest pain, palpitations and leg swelling.  Gastrointestinal:  Negative for abdominal pain.  Genitourinary:  Negative for dysuria and vaginal bleeding.  Musculoskeletal:  Negative  for back pain.  Neurological:  Positive for tremors and weakness. Negative for syncope, light-headedness and headaches.  Psychiatric/Behavioral:  Negative for dysphoric mood.    Objective:  BP 90/60   Pulse 88   Temp 98 F (36.7 C) (Temporal)   Ht 5' 5.5 (1.664 m)   Wt 180 lb 6 oz (81.8 kg)   SpO2 98%   BMI 29.56 kg/m   Wt Readings from Last 3 Encounters:  08/18/24 180 lb 6 oz (81.8 kg)  07/30/24 171 lb (77.6 kg)  06/14/24 174 lb 9.6 oz (79.2 kg)      Physical Exam Constitutional:      General: She is not in acute distress.    Appearance: Normal appearance. She is well-developed. She is not ill-appearing or toxic-appearing.  HENT:     Head: Normocephalic.     Right Ear: Hearing, tympanic membrane, ear  canal and external ear normal. Tympanic membrane is not erythematous, retracted or bulging.     Left Ear: Hearing, tympanic membrane, ear canal and external ear normal. Tympanic membrane is not erythematous, retracted or bulging.     Nose: No mucosal edema or rhinorrhea.     Right Sinus: No maxillary sinus tenderness or frontal sinus tenderness.     Left Sinus: No maxillary sinus tenderness or frontal sinus tenderness.     Mouth/Throat:     Pharynx: Uvula midline.  Eyes:     General: Lids are normal. Lids are everted, no foreign bodies appreciated.     Conjunctiva/sclera: Conjunctivae normal.     Pupils: Pupils are equal, round, and reactive to light.  Neck:     Thyroid : No thyroid  mass or thyromegaly.     Vascular: No carotid bruit.     Trachea: Trachea normal.  Cardiovascular:     Rate and Rhythm: Normal rate and regular rhythm.     Pulses: Normal pulses.     Heart sounds: Normal heart sounds, S1 normal and S2 normal. No murmur heard.    No friction rub. No gallop.  Pulmonary:     Effort: Pulmonary effort is normal. No tachypnea or respiratory distress.     Breath sounds: Normal breath sounds. No decreased breath sounds, wheezing, rhonchi or rales.  Abdominal:     General: Bowel sounds are normal.     Palpations: Abdomen is soft.     Tenderness: There is no abdominal tenderness.  Musculoskeletal:     Cervical back: Normal range of motion and neck supple.  Skin:    General: Skin is warm and dry.     Findings: No rash.  Neurological:     Mental Status: She is alert and oriented to person, place, and time.     GCS: GCS eye subscore is 4. GCS verbal subscore is 5. GCS motor subscore is 6.     Cranial Nerves: No cranial nerve deficit.     Sensory: No sensory deficit.     Motor: No abnormal muscle tone.     Coordination: Coordination normal.     Gait: Gait normal.     Deep Tendon Reflexes: Reflexes are normal and symmetric.     Comments: Nml cerebellar exam   No papilledema   Psychiatric:        Mood and Affect: Mood is not anxious or depressed.        Speech: Speech normal.        Behavior: Behavior normal. Behavior is cooperative.        Thought Content: Thought content normal.  Cognition and Memory: Memory is not impaired. She does not exhibit impaired recent memory or impaired remote memory.        Judgment: Judgment normal.       Results for orders placed or performed during the hospital encounter of 07/30/24  Basic metabolic panel   Collection Time: 07/30/24  7:09 PM  Result Value Ref Range   Sodium 139 135 - 145 mmol/L   Potassium 3.9 3.5 - 5.1 mmol/L   Chloride 100 98 - 111 mmol/L   CO2 27 22 - 32 mmol/L   Glucose, Bld 108 (H) 70 - 99 mg/dL   BUN 18 6 - 20 mg/dL   Creatinine, Ser 9.22 0.44 - 1.00 mg/dL   Calcium 89.8 8.9 - 89.6 mg/dL   GFR, Estimated >39 >39 mL/min   Anion gap 12 5 - 15  CBC   Collection Time: 07/30/24  7:09 PM  Result Value Ref Range   WBC 8.0 4.0 - 10.5 K/uL   RBC 4.33 3.87 - 5.11 MIL/uL   Hemoglobin 13.5 12.0 - 15.0 g/dL   HCT 60.0 63.9 - 53.9 %   MCV 92.1 80.0 - 100.0 fL   MCH 31.2 26.0 - 34.0 pg   MCHC 33.8 30.0 - 36.0 g/dL   RDW 87.3 88.4 - 84.4 %   Platelets 266 150 - 400 K/uL   nRBC 0.0 0.0 - 0.2 %  Troponin T, High Sensitivity   Collection Time: 07/30/24  7:09 PM  Result Value Ref Range   Troponin T High Sensitivity <15 0 - 19 ng/L    Assessment and Plan  Shakiness Assessment & Plan: Acute, unclear etiology.  Will evaluate with lab work. Could be related to poor sleep.  Patient denies anxiety.  Some supplement use but no clear medication causing symptoms.  Orders: -     Comprehensive metabolic panel with GFR -     Hemoglobin A1c -     TSH  Other fatigue -     Comprehensive metabolic panel with GFR -     Hemoglobin A1c -     TSH  Overweight with body mass index (BMI) 25.0-29.9 Assessment & Plan: Chronic, no improvement with exercise and dietary changes although patient is not currently  working on these.  Encouraged her to get back to healthy eating habits, regular exercise such as walking daily.  We have discussed GLP-1 medications in detail.  She will check with her employer to see if weight loss medications are covered.  We discussed benefits and side effects as well as contraindications for GLP-1 medication.  The oral self-pay semaglutide  may be a good option for her given the reduced cost if her employer does not cover GLP-1 medications.   Dry eyes    No follow-ups on file.   Greig Ring, MD  "

## 2024-08-18 NOTE — Assessment & Plan Note (Signed)
 Acute, unclear etiology.  Will evaluate with lab work. Could be related to poor sleep.  Patient denies anxiety.  Some supplement use but no clear medication causing symptoms.

## 2024-08-19 LAB — COMPREHENSIVE METABOLIC PANEL WITH GFR
AG Ratio: 1.9 (calc) (ref 1.0–2.5)
ALT: 39 U/L — ABNORMAL HIGH (ref 6–29)
AST: 22 U/L (ref 10–35)
Albumin: 4.3 g/dL (ref 3.6–5.1)
Alkaline phosphatase (APISO): 46 U/L (ref 37–153)
BUN: 13 mg/dL (ref 7–25)
CO2: 26 mmol/L (ref 20–32)
Calcium: 9.1 mg/dL (ref 8.6–10.4)
Chloride: 104 mmol/L (ref 98–110)
Creat: 0.83 mg/dL (ref 0.50–1.03)
Globulin: 2.3 g/dL (ref 1.9–3.7)
Glucose, Bld: 107 mg/dL — ABNORMAL HIGH (ref 65–99)
Potassium: 4 mmol/L (ref 3.5–5.3)
Sodium: 137 mmol/L (ref 135–146)
Total Bilirubin: 0.3 mg/dL (ref 0.2–1.2)
Total Protein: 6.6 g/dL (ref 6.1–8.1)
eGFR: 83 mL/min/1.73m2

## 2024-08-19 LAB — HEMOGLOBIN A1C
Hgb A1c MFr Bld: 5.6 %
Mean Plasma Glucose: 114 mg/dL
eAG (mmol/L): 6.3 mmol/L

## 2024-08-19 LAB — TSH: TSH: 1.06 m[IU]/L (ref 0.40–4.50)

## 2024-08-22 ENCOUNTER — Other Ambulatory Visit: Payer: Self-pay | Admitting: Family Medicine

## 2024-08-22 ENCOUNTER — Ambulatory Visit: Payer: Self-pay | Admitting: Family Medicine

## 2024-08-22 MED ORDER — WEGOVY 0.25 MG/0.5ML ~~LOC~~ SOAJ: 0.2500 mg | SUBCUTANEOUS | 0 refills | Status: DC

## 2024-08-23 ENCOUNTER — Other Ambulatory Visit: Payer: Self-pay | Admitting: Family Medicine

## 2024-08-23 DIAGNOSIS — E663 Overweight: Secondary | ICD-10-CM

## 2024-08-23 DIAGNOSIS — R7303 Prediabetes: Secondary | ICD-10-CM

## 2024-08-23 NOTE — Telephone Encounter (Signed)
 Pharmacy comment: Script Clarification:NOT COVERED.

## 2024-08-23 NOTE — Telephone Encounter (Signed)
 Pharmacy comment: Alternative Requested:NOT COVERED.

## 2024-09-04 MED ORDER — ZEPBOUND 2.5 MG/0.5ML ~~LOC~~ SOAJ: 2.5000 mg | SUBCUTANEOUS | 1 refills | Status: AC

## 2024-09-04 NOTE — Addendum Note (Signed)
 Addended by: WENDELL ARLAND RAMAN on: 09/04/2024 10:20 AM   Modules accepted: Orders

## 2024-09-04 NOTE — Addendum Note (Signed)
 Addended by: WENDELL ARLAND RAMAN on: 09/04/2024 03:26 PM   Modules accepted: Orders

## 2024-09-04 NOTE — Telephone Encounter (Signed)
 I do not see a diagnosis in her problem list of prediabetes.  She did have an A1c of 6.0 on 04/26/24.  Okay to add to her problem list?

## 2024-09-13 ENCOUNTER — Encounter: Payer: Self-pay | Admitting: *Deleted

## 2024-09-13 DIAGNOSIS — R7303 Prediabetes: Secondary | ICD-10-CM | POA: Insufficient documentation
